# Patient Record
Sex: Female | Born: 1945 | Race: White | Hispanic: No | Smoking: Former smoker
Health system: Southern US, Community
[De-identification: ages and names within clinical notes are randomized; demographics above are authoritative.]

## PROBLEM LIST (undated history)

## (undated) DIAGNOSIS — C569 Malignant neoplasm of unspecified ovary: Secondary | ICD-10-CM

## (undated) DIAGNOSIS — I77810 Thoracic aortic ectasia: Secondary | ICD-10-CM

## (undated) DIAGNOSIS — E039 Hypothyroidism, unspecified: Secondary | ICD-10-CM

## (undated) DIAGNOSIS — E785 Hyperlipidemia, unspecified: Secondary | ICD-10-CM

## (undated) DIAGNOSIS — I10 Essential (primary) hypertension: Secondary | ICD-10-CM

## (undated) DIAGNOSIS — M1990 Unspecified osteoarthritis, unspecified site: Secondary | ICD-10-CM

## (undated) DIAGNOSIS — H3530 Unspecified macular degeneration: Secondary | ICD-10-CM

## (undated) HISTORY — PX: BUNIONECTOMY: SHX129

## (undated) HISTORY — PX: TONSILLECTOMY: SUR1361

## (undated) HISTORY — DX: Hypothyroidism, unspecified: E03.9

## (undated) HISTORY — PX: COLONOSCOPY: SHX174

## (undated) HISTORY — DX: Hyperlipidemia, unspecified: E78.5

## (undated) HISTORY — PX: SHOULDER ARTHROSCOPY: SHX128

## (undated) HISTORY — PX: REFRACTIVE SURGERY: SHX103

## (undated) HISTORY — PX: EYE SURGERY: SHX253

## (undated) HISTORY — PX: ANKLE FRACTURE SURGERY: SHX122

---

## 1998-11-24 ENCOUNTER — Other Ambulatory Visit: Admission: RE | Admit: 1998-11-24 | Discharge: 1998-11-24 | Payer: Self-pay | Admitting: Obstetrics and Gynecology

## 1999-12-16 ENCOUNTER — Encounter: Payer: Self-pay | Admitting: *Deleted

## 1999-12-16 ENCOUNTER — Encounter: Admission: RE | Admit: 1999-12-16 | Discharge: 1999-12-16 | Payer: Self-pay | Admitting: *Deleted

## 2000-01-10 ENCOUNTER — Other Ambulatory Visit: Admission: RE | Admit: 2000-01-10 | Discharge: 2000-01-10 | Payer: Self-pay | Admitting: Obstetrics and Gynecology

## 2000-06-09 ENCOUNTER — Encounter: Admission: RE | Admit: 2000-06-09 | Discharge: 2000-06-09 | Payer: Self-pay | Admitting: *Deleted

## 2000-06-09 ENCOUNTER — Encounter: Payer: Self-pay | Admitting: *Deleted

## 2001-01-25 ENCOUNTER — Encounter: Admission: RE | Admit: 2001-01-25 | Discharge: 2001-01-25 | Payer: Self-pay | Admitting: Internal Medicine

## 2001-01-25 ENCOUNTER — Encounter: Payer: Self-pay | Admitting: Internal Medicine

## 2001-02-23 ENCOUNTER — Other Ambulatory Visit: Admission: RE | Admit: 2001-02-23 | Discharge: 2001-02-23 | Payer: Self-pay | Admitting: Obstetrics and Gynecology

## 2001-10-15 ENCOUNTER — Encounter: Admission: RE | Admit: 2001-10-15 | Discharge: 2001-10-15 | Payer: Self-pay | Admitting: Orthopedic Surgery

## 2001-10-15 ENCOUNTER — Encounter: Payer: Self-pay | Admitting: Orthopedic Surgery

## 2002-02-12 ENCOUNTER — Encounter: Admission: RE | Admit: 2002-02-12 | Discharge: 2002-02-12 | Payer: Self-pay | Admitting: Internal Medicine

## 2002-02-12 ENCOUNTER — Encounter: Payer: Self-pay | Admitting: Internal Medicine

## 2002-04-18 ENCOUNTER — Other Ambulatory Visit: Admission: RE | Admit: 2002-04-18 | Discharge: 2002-04-18 | Payer: Self-pay | Admitting: Obstetrics and Gynecology

## 2003-02-19 ENCOUNTER — Encounter: Payer: Self-pay | Admitting: Internal Medicine

## 2003-02-19 ENCOUNTER — Encounter: Admission: RE | Admit: 2003-02-19 | Discharge: 2003-02-19 | Payer: Self-pay | Admitting: Internal Medicine

## 2003-03-03 ENCOUNTER — Encounter: Admission: RE | Admit: 2003-03-03 | Discharge: 2003-03-03 | Payer: Self-pay | Admitting: Obstetrics and Gynecology

## 2003-03-03 ENCOUNTER — Encounter: Payer: Self-pay | Admitting: Obstetrics and Gynecology

## 2003-04-28 ENCOUNTER — Other Ambulatory Visit: Admission: RE | Admit: 2003-04-28 | Discharge: 2003-04-28 | Payer: Self-pay | Admitting: Obstetrics and Gynecology

## 2004-03-04 ENCOUNTER — Other Ambulatory Visit: Admission: RE | Admit: 2004-03-04 | Discharge: 2004-03-04 | Payer: Self-pay | Admitting: Internal Medicine

## 2004-03-18 ENCOUNTER — Encounter: Admission: RE | Admit: 2004-03-18 | Discharge: 2004-03-18 | Payer: Self-pay | Admitting: Internal Medicine

## 2004-05-24 ENCOUNTER — Encounter (INDEPENDENT_AMBULATORY_CARE_PROVIDER_SITE_OTHER): Payer: Self-pay | Admitting: Specialist

## 2004-05-24 ENCOUNTER — Ambulatory Visit (HOSPITAL_COMMUNITY): Admission: RE | Admit: 2004-05-24 | Discharge: 2004-05-24 | Payer: Self-pay | Admitting: Gastroenterology

## 2005-04-05 ENCOUNTER — Encounter: Admission: RE | Admit: 2005-04-05 | Discharge: 2005-04-05 | Payer: Self-pay | Admitting: Internal Medicine

## 2005-06-09 ENCOUNTER — Other Ambulatory Visit: Admission: RE | Admit: 2005-06-09 | Discharge: 2005-06-09 | Payer: Self-pay | Admitting: Internal Medicine

## 2005-06-13 ENCOUNTER — Encounter: Admission: RE | Admit: 2005-06-13 | Discharge: 2005-06-13 | Payer: Self-pay | Admitting: Internal Medicine

## 2006-04-06 ENCOUNTER — Encounter: Admission: RE | Admit: 2006-04-06 | Discharge: 2006-04-06 | Payer: Self-pay | Admitting: Internal Medicine

## 2006-07-11 ENCOUNTER — Other Ambulatory Visit: Admission: RE | Admit: 2006-07-11 | Discharge: 2006-07-11 | Payer: Self-pay | Admitting: Internal Medicine

## 2006-07-14 ENCOUNTER — Encounter: Admission: RE | Admit: 2006-07-14 | Discharge: 2006-07-14 | Payer: Self-pay | Admitting: Internal Medicine

## 2007-05-10 ENCOUNTER — Encounter: Admission: RE | Admit: 2007-05-10 | Discharge: 2007-05-10 | Payer: Self-pay | Admitting: *Deleted

## 2007-07-16 ENCOUNTER — Other Ambulatory Visit: Admission: RE | Admit: 2007-07-16 | Discharge: 2007-07-16 | Payer: Self-pay | Admitting: *Deleted

## 2008-05-15 ENCOUNTER — Encounter: Admission: RE | Admit: 2008-05-15 | Discharge: 2008-05-15 | Payer: Self-pay | Admitting: *Deleted

## 2009-05-19 ENCOUNTER — Encounter: Admission: RE | Admit: 2009-05-19 | Discharge: 2009-05-19 | Payer: Self-pay | Admitting: Family Medicine

## 2009-07-30 ENCOUNTER — Encounter: Admission: RE | Admit: 2009-07-30 | Discharge: 2009-07-30 | Payer: Self-pay | Admitting: Internal Medicine

## 2010-05-20 ENCOUNTER — Encounter: Admission: RE | Admit: 2010-05-20 | Discharge: 2010-05-20 | Payer: Self-pay | Admitting: *Deleted

## 2010-10-06 ENCOUNTER — Other Ambulatory Visit: Admission: RE | Admit: 2010-10-06 | Discharge: 2010-10-06 | Payer: Self-pay | Admitting: Family Medicine

## 2011-01-01 ENCOUNTER — Encounter: Payer: Self-pay | Admitting: Internal Medicine

## 2011-04-12 ENCOUNTER — Other Ambulatory Visit: Payer: Self-pay | Admitting: Internal Medicine

## 2011-04-12 DIAGNOSIS — Z1231 Encounter for screening mammogram for malignant neoplasm of breast: Secondary | ICD-10-CM

## 2011-04-29 NOTE — Op Note (Signed)
Lori Conrad, Lori Conrad                            ACCOUNT NO.:  [id number]   MEDICAL RECORD NO.:  [id number]                   PATIENT TYPE:  AMB   LOCATION:  ENDO                                 FACILITY:  [facility]   PHYSICIAN:  Bernette Redbird, M.D.                DATE OF BIRTH:  December 11, 1946   DATE OF PROCEDURE:  05/24/2004  DATE OF DISCHARGE:                                 OPERATIVE REPORT   PROCEDURE:  Upper endoscopy.   INDICATIONS FOR PROCEDURE:  Transiently hemoccult positive stool on rectal  exam with no anemia and subsequent hemoccult negative.  No upper tract  symptoms.   FINDINGS:  Normal exam.   PROCEDURE:  The nature, purpose, and risks of the procedure had been  discussed with the patient who provided written consent.  Sedation was  Fentanyl 60 mcg and Versed 6 mg IV without arrhythmias or desaturations.  The Olympus video endoscope was passed under direct vision.  The larynx  looked totally normal.  The esophagus was readily entered and had entirely  normal mucosa without evidence of reflux esophagitis, free reflux, Barrett's  esophagus, varices, infection, neoplasia, ring, stricture, or hiatal hernia.  The stomach contained no significant residual and had normal mucosa without  evidence of gastritis, erosions, ulcers, polyps, or masses including a  retroflex view of the cardia, and the pylorus, duodenal bulb, and second  duodenum looked normal.  The scope was removed from the patient, no biopsies  were obtained.  She tolerated the procedure well and there were no apparent  complications.   IMPRESSION:  Heme positive stool without source evidence on this exam  (792.1).   PLAN:  Proceed to colonoscopic evaluation.                                               Bernette Redbird, M.D.    RB/MEDQ  D:  05/24/2004  T:  05/24/2004  Job:  [id number]   cc:   Marcene Duos, M.D.  [address]  [place]  [place] [address]  Fax: [phone]

## 2011-04-29 NOTE — Op Note (Signed)
NAMEAMBRY, DIX                            ACCOUNT NO.:  [id number]   MEDICAL RECORD NO.:  [id number]                   PATIENT TYPE:  AMB   LOCATION:  ENDO                                 FACILITY:  [facility]   PHYSICIAN:  Bernette Redbird, M.D.                DATE OF BIRTH:  07-06-1946   DATE OF PROCEDURE:  05/24/2004  DATE OF DISCHARGE:                                 OPERATIVE REPORT   PROCEDURE:  Colonoscopy with biopsy.   INDICATIONS FOR PROCEDURE:  Lori Conrad is a 65 year old female who had  transiently Hemoccult positive stool. It was detected on the rectal exam in  her primary physician's office but subsequent home Hemoccults were negative  and she is not anemic. There is no family history of colorectal neoplasia  nor has she had any worrisome symptoms. She had a negative flexible  sigmoidoscopy by me on two prior occasions, 5 years ago and 13 years ago.  Her endoscopy was negative for any source of heme positivity.   FINDINGS:  Diminutive rectal polyp.   DESCRIPTION OF PROCEDURE:  The nature, purpose and risk of the procedure had  been discussed with the patient who provided written consent. Sedation for  this procedure and the upper endoscopy which preceded it totaled fentanyl 90  mcg and Versed 9 mg IV without arrhythmias or desaturations.   The Olympus adjustable tension pediatric video colonoscope was advanced with  some looping, overcome by having the patient in the supine position and  applying external abdominal compression.  The terminal ileum looked normal  and pullback was then performed. The quality of the prep was excellent and  it is felt that all areas were well seen.   There was a diminutive 3 mm sessile hyperplastic appearing polyp in the  rectum about 6 cm in the external anal opening, removed by a couple of cold  biopsies.  It did not look like it would account for heme positivity.  No  other polyps were seen and there was no evidence of cancer, colitis,  vascular malformations or diverticulosis. Retroflexion in the rectum was  normal.   The patient tolerated the procedure well and there were no apparent  complications.   IMPRESSION:  1. Diminutive sessile rectal polyp removed as described above (211.4).  2. No source of transiently Hemoccult positive stool evident either on this     exam or her upper endoscopy.   PLAN:  Await pathology on the polyp. Sigmoidoscopy in five years if it is  hyperplastic, or colonoscopy in five years if it is adenomatous. No further  evaluation of the transiently Hemoccult positive stool needed at present.                                               Molly Maduro  Buccini, M.D.    RB/MEDQ  D:  05/24/2004  T:  05/24/2004  Job:  [id number]   cc:   Marcene Duos, M.D.  [address]  [place]  [place] [address]  Fax: [phone]

## 2011-05-23 ENCOUNTER — Ambulatory Visit
Admission: RE | Admit: 2011-05-23 | Discharge: 2011-05-23 | Disposition: A | Discharge status: Home / Self Care | Payer: Medicare Other | Source: Ambulatory Visit | Attending: Internal Medicine | Admitting: Internal Medicine

## 2011-05-23 DIAGNOSIS — Z1231 Encounter for screening mammogram for malignant neoplasm of breast: Secondary | ICD-10-CM

## 2012-04-27 ENCOUNTER — Other Ambulatory Visit: Payer: Self-pay | Admitting: Family Medicine

## 2012-04-27 DIAGNOSIS — Z1231 Encounter for screening mammogram for malignant neoplasm of breast: Secondary | ICD-10-CM

## 2012-05-29 ENCOUNTER — Ambulatory Visit
Admission: RE | Admit: 2012-05-29 | Discharge: 2012-05-29 | Disposition: A | Discharge status: Home / Self Care | Payer: Medicare Other | Source: Ambulatory Visit | Attending: Family Medicine | Admitting: Family Medicine

## 2012-05-29 DIAGNOSIS — Z1231 Encounter for screening mammogram for malignant neoplasm of breast: Secondary | ICD-10-CM

## 2012-10-10 ENCOUNTER — Other Ambulatory Visit: Payer: Self-pay | Admitting: Family Medicine

## 2013-04-24 ENCOUNTER — Other Ambulatory Visit: Payer: Self-pay

## 2013-04-24 DIAGNOSIS — Z1231 Encounter for screening mammogram for malignant neoplasm of breast: Secondary | ICD-10-CM

## 2013-05-30 ENCOUNTER — Ambulatory Visit
Admission: RE | Admit: 2013-05-30 | Discharge: 2013-05-30 | Disposition: A | Discharge status: Home / Self Care | Payer: Medicare Other | Source: Ambulatory Visit

## 2013-05-30 DIAGNOSIS — Z1231 Encounter for screening mammogram for malignant neoplasm of breast: Secondary | ICD-10-CM

## 2014-04-04 ENCOUNTER — Other Ambulatory Visit: Payer: Self-pay | Admitting: Family Medicine

## 2014-04-04 DIAGNOSIS — R05 Cough: Secondary | ICD-10-CM

## 2014-04-04 DIAGNOSIS — R053 Chronic cough: Secondary | ICD-10-CM

## 2014-04-10 ENCOUNTER — Ambulatory Visit
Admission: RE | Admit: 2014-04-10 | Discharge: 2014-04-10 | Disposition: A | Discharge status: Home / Self Care | Payer: Medicare Other | Source: Ambulatory Visit | Attending: Family Medicine | Admitting: Family Medicine

## 2014-04-10 ENCOUNTER — Encounter (INDEPENDENT_AMBULATORY_CARE_PROVIDER_SITE_OTHER): Payer: Self-pay

## 2014-04-10 DIAGNOSIS — R05 Cough: Secondary | ICD-10-CM

## 2014-04-10 DIAGNOSIS — R053 Chronic cough: Secondary | ICD-10-CM

## 2014-05-06 ENCOUNTER — Other Ambulatory Visit: Payer: Self-pay

## 2014-05-06 DIAGNOSIS — Z1231 Encounter for screening mammogram for malignant neoplasm of breast: Secondary | ICD-10-CM

## 2014-05-15 ENCOUNTER — Telehealth (HOSPITAL_COMMUNITY): Payer: Self-pay

## 2014-05-15 ENCOUNTER — Encounter: Payer: Self-pay | Admitting: Internal Medicine

## 2014-05-15 ENCOUNTER — Ambulatory Visit (INDEPENDENT_AMBULATORY_CARE_PROVIDER_SITE_OTHER): Payer: Medicare Other | Admitting: Internal Medicine

## 2014-05-15 VITALS — BP 124/72 | HR 80 | Ht 62.0 in | Wt 115.6 lb

## 2014-05-15 DIAGNOSIS — R0600 Dyspnea, unspecified: Secondary | ICD-10-CM

## 2014-05-15 DIAGNOSIS — R0989 Other specified symptoms and signs involving the circulatory and respiratory systems: Secondary | ICD-10-CM

## 2014-05-15 DIAGNOSIS — R0609 Other forms of dyspnea: Secondary | ICD-10-CM

## 2014-05-15 DIAGNOSIS — E785 Hyperlipidemia, unspecified: Secondary | ICD-10-CM

## 2014-05-15 DIAGNOSIS — I2510 Atherosclerotic heart disease of native coronary artery without angina pectoris: Secondary | ICD-10-CM

## 2014-05-15 DIAGNOSIS — R931 Abnormal findings on diagnostic imaging of heart and coronary circulation: Secondary | ICD-10-CM

## 2014-05-15 DIAGNOSIS — R0602 Shortness of breath: Secondary | ICD-10-CM

## 2014-05-15 DIAGNOSIS — I2584 Coronary atherosclerosis due to calcified coronary lesion: Secondary | ICD-10-CM

## 2014-05-15 NOTE — Patient Instructions (Signed)
Your physician has requested that you have an exercise tolerance test. For further information please visit [email or web address]. Please also follow instruction sheet, as given.  Your physician recommends that you return for lab work at your earliest convenience.   Your physician recommends that you schedule a follow-up appointment in: 1 month.

## 2014-05-19 ENCOUNTER — Encounter: Payer: Self-pay | Admitting: Internal Medicine

## 2014-05-19 DIAGNOSIS — R0609 Other forms of dyspnea: Secondary | ICD-10-CM

## 2014-05-19 DIAGNOSIS — R0600 Dyspnea, unspecified: Secondary | ICD-10-CM | POA: Insufficient documentation

## 2014-05-19 DIAGNOSIS — R931 Abnormal findings on diagnostic imaging of heart and coronary circulation: Secondary | ICD-10-CM | POA: Insufficient documentation

## 2014-05-19 DIAGNOSIS — E785 Hyperlipidemia, unspecified: Secondary | ICD-10-CM | POA: Insufficient documentation

## 2014-05-19 LAB — NMR LIPOPROFILE WITH LIPIDS
CHOLESTEROL, TOTAL: 207 mg/dL — AB (ref <200)
HDL PARTICLE NUMBER: 51.3 umol/L (ref >=30.5)
HDL Size: 9.4 nm (ref >=9.2)
HDL-C: 87 mg/dL (ref >=40)
LDL CALC: 106 mg/dL — AB (ref <100)
LDL PARTICLE NUMBER: 1427 nmol/L — AB (ref <1000)
LDL Size: 21 nm (ref >20.5)
LP-IR SCORE: 33 (ref <=45)
Large HDL-P: 13.6 umol/L (ref >=4.8)
Large VLDL-P: 1.6 nmol/L (ref <=2.7)
SMALL LDL PARTICLE NUMBER: 553 nmol/L — AB (ref <=527)
TRIGLYCERIDES: 68 mg/dL (ref <150)
VLDL Size: 48.4 nm — ABNORMAL HIGH (ref <=46.6)

## 2014-05-19 NOTE — Progress Notes (Signed)
OFFICE NOTE  Chief Complaint:  Abnormal chest CT showing coronary calcium  Primary Care Physician: Cammy Copa, MD  HPI:  Lori Conrad is a pleasant 68 year old female who was referred to me by Dr. Rhina Brackett for evaluation of coronary artery calcium. Lori Conrad is has a history of smoking in the past and has had cough for about 2 months. She recently underwent a CT scan to evaluate for possible malignancy based on guidelines. This did not show any nodule, infiltrate or adenopathy. There was however a prominent descending aorta measuring 4 cm as well as coronary artery calcifications. She is referred for evaluation of her coronary artery calcium. This was not a quantitative exam. With regards to symptoms she reports a mild shortness of breath with exercise however no significant chest pain. She does do exercise at the gym several times a week. She has a remote history of possible mitral valve prolapse. Shows a has hypothyroidism and dyslipidemia. There is a family history of heart disease, however not in her first generation relatives.  PMHx:  Past Medical History  Diagnosis Date  . Hyperlipidemia   . Hypothyroidism     History reviewed. No pertinent past surgical history.  FAMHx:  Family History  Problem Relation Age of Onset  . Alzheimer's disease Father   . CAD Father   . Lung cancer Mother   . Stroke Mother 73  . Alzheimer's disease Father   . CAD Father     95% occlusion of L Main & RCA, severe descending aorta, arterial & arteriolonephrosclerosis   . Cholecystitis Father   . Esophagitis Father     SOCHx:   reports that she quit smoking about 26 years ago. She has never used smokeless tobacco. She reports that she drinks about 7 ounces of alcohol per week. She reports that she does not use illicit drugs.  ALLERGIES:  No Known Allergies  ROS: A comprehensive review of systems was negative except for: Respiratory: positive for dyspnea on  exertion  HOME MEDS: Current Outpatient Prescriptions  Medication Sig Dispense Refill  . alendronate (FOSAMAX) 70 MG tablet Take 1 tablet by mouth once a week.      Marland Kitchen aspirin 81 MG tablet Take 81 mg by mouth daily.      Marland Kitchen atorvastatin (LIPITOR) 10 MG tablet Take 1 tablet by mouth daily.      . Calcium Citrate-Vitamin D (CALCIUM CITRATE + D PO) Take by mouth daily. 600mg  with 400IU      . Coenzyme Q10 (CO Q-10) 100 MG CAPS Take by mouth daily.      . diclofenac (CATAFLAM) 50 MG tablet Take 1 tablet by mouth daily as needed.      Marland Kitchen glucosamine-chondroitin 500-400 MG tablet Take 3 tablets by mouth daily.      . Magnesium 250 MG TABS Take 1 tablet by mouth daily.      . Multiple Vitamins-Minerals (CENTRUM ADULTS PO) Take by mouth daily.      . Omega 3-6-9 Fatty Acids (OMEGA 3-6-9 COMPLEX PO) Take by mouth daily.      Marland Kitchen SYNTHROID 100 MCG tablet Take 1 tablet by mouth as directed. Take on MWF 112 mcg T, Th, Sat, Sun      . zolpidem (AMBIEN) 10 MG tablet at bedtime as needed.       No current facility-administered medications for this visit.    LABS/IMAGING: No results found for this or any previous visit (from the past 48 hour(s)). No results  found.  VITALS: BP 124/72  Pulse 80  Ht 5\' 2"  (1.575 m)  Wt 115 lb 9.6 oz (52.436 kg)  BMI 21.14 kg/m2  EXAM: General appearance: alert and no distress Neck: no carotid bruit and no JVD Lungs: clear to auscultation bilaterally Heart: regular rate and rhythm, S1, S2 normal, no murmur, click, rub or gallop Abdomen: soft, non-tender; bowel sounds normal; no masses,  no organomegaly Extremities: extremities normal, atraumatic, no cyanosis or edema Pulses: 2+ and symmetric Skin: Skin color, texture, turgor normal. No rashes or lesions Neurologic: Grossly normal Psych: Mood, affect normal  EKG: Normal sinus rhythm at 80  ASSESSMENT: 1. Coronary artery calcification 2. Dyslipidemia 3. Prediabetes 4. Mild dyspnea with exertion  PLAN: 1.    Lori Conrad has coronary artery calcium, however this may been present for some time. She does have a smoking history and dyslipidemia. She is currently well treated for both. She is stop smoking. She is not having any anginal symptoms but does get mild shortness of breath with exercise. I would recommend an exercise treadmill stress test to evaluate for significant ischemia with exercise. If this is negative I would continue with risk factor modification and better dietary glucose control.    Thank you as always for the consultation.  Pixie Casino, MD, [facility] Attending Cardiologist [place] 05/19/2014, 2:48 PM

## 2014-05-20 ENCOUNTER — Ambulatory Visit (HOSPITAL_COMMUNITY)
Admission: RE | Admit: 2014-05-20 | Discharge: 2014-05-20 | Disposition: A | Discharge status: Home / Self Care | Payer: Medicare Other | Source: Ambulatory Visit | Attending: Cardiovascular Disease | Admitting: Cardiovascular Disease

## 2014-05-20 DIAGNOSIS — I2584 Coronary atherosclerosis due to calcified coronary lesion: Secondary | ICD-10-CM

## 2014-05-20 DIAGNOSIS — R0602 Shortness of breath: Secondary | ICD-10-CM

## 2014-05-20 DIAGNOSIS — I2510 Atherosclerotic heart disease of native coronary artery without angina pectoris: Secondary | ICD-10-CM

## 2014-05-21 ENCOUNTER — Telehealth: Payer: Self-pay | Admitting: *Deleted

## 2014-05-21 NOTE — Telephone Encounter (Signed)
Patient notified of normal exercise tolerance test results. Reminded of OV July 7th.

## 2014-06-03 ENCOUNTER — Ambulatory Visit
Admission: RE | Admit: 2014-06-03 | Discharge: 2014-06-03 | Disposition: A | Discharge status: Home / Self Care | Payer: Medicare Other | Source: Ambulatory Visit

## 2014-06-03 ENCOUNTER — Encounter (HOSPITAL_COMMUNITY): Payer: Medicare Other

## 2014-06-03 ENCOUNTER — Encounter (INDEPENDENT_AMBULATORY_CARE_PROVIDER_SITE_OTHER): Payer: Self-pay

## 2014-06-03 DIAGNOSIS — Z1231 Encounter for screening mammogram for malignant neoplasm of breast: Secondary | ICD-10-CM

## 2014-06-17 ENCOUNTER — Encounter: Payer: Self-pay | Admitting: Internal Medicine

## 2014-06-17 ENCOUNTER — Ambulatory Visit (INDEPENDENT_AMBULATORY_CARE_PROVIDER_SITE_OTHER): Payer: Medicare Other | Admitting: Internal Medicine

## 2014-06-17 ENCOUNTER — Encounter: Payer: Self-pay | Admitting: *Deleted

## 2014-06-17 VITALS — BP 130/78 | HR 78 | Ht 63.0 in | Wt 114.9 lb

## 2014-06-17 DIAGNOSIS — R0609 Other forms of dyspnea: Secondary | ICD-10-CM

## 2014-06-17 DIAGNOSIS — E785 Hyperlipidemia, unspecified: Secondary | ICD-10-CM

## 2014-06-17 DIAGNOSIS — R931 Abnormal findings on diagnostic imaging of heart and coronary circulation: Secondary | ICD-10-CM

## 2014-06-17 DIAGNOSIS — I2510 Atherosclerotic heart disease of native coronary artery without angina pectoris: Secondary | ICD-10-CM

## 2014-06-17 DIAGNOSIS — R0989 Other specified symptoms and signs involving the circulatory and respiratory systems: Secondary | ICD-10-CM

## 2014-06-17 DIAGNOSIS — R0600 Dyspnea, unspecified: Secondary | ICD-10-CM

## 2014-06-17 MED ORDER — ATORVASTATIN CALCIUM 40 MG PO TABS: 40.0000 mg | ORAL_TABLET | Freq: Every day | ORAL | Status: DC

## 2014-06-17 NOTE — Progress Notes (Signed)
OFFICE NOTE  Chief Complaint:  Abnormal chest CT showing coronary calcium  Primary Care Physician: Cammy Copa, MD  HPI:  Lori Conrad is a pleasant 68 year old female who was referred to me by Dr. Rhina Brackett for evaluation of coronary artery calcium. [name] is has a history of smoking in the past and has had cough for about 2 months. She recently underwent a CT scan to evaluate for possible malignancy based on guidelines. This did not show any nodule, infiltrate or adenopathy. There was however a prominent descending aorta measuring 4 cm as well as coronary artery calcifications. She is referred for evaluation of her coronary artery calcium. This was not a quantitative exam. With regards to symptoms she reports a mild shortness of breath with exercise however no significant chest pain. She does do exercise at the gym several times a week. She has a remote history of possible mitral valve prolapse. Shows a has hypothyroidism and dyslipidemia. There is a family history of heart disease, however not in her first generation relatives.  Mrs. Wyse returns today for followup. She did an excellent job on her exercise treadmill stress test. She exercised about 9 minutes with no symptoms. This was negative for ischemia.  There was a hypertensive response to exercise although she does have hypertension which is controlled. 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 She did have a lipid profile which was performed showing a particle number in the 1400s and LDL of 106. Obviously this could be lower to reduce her risk of coronary disease.  PMHx:  Past Medical History  Diagnosis Date  . Hyperlipidemia   . Hypothyroidism     History reviewed. No pertinent past surgical history.  FAMHx:  Family History  Problem Relation Age of Onset  . Alzheimer's disease Father   . CAD Father   . Lung cancer Mother   . Stroke Mother 45  . Alzheimer's disease Father   . CAD  Father     95% occlusion of L Main & RCA, severe descending aorta, arterial & arteriolonephrosclerosis   . Cholecystitis Father   . Esophagitis Father     SOCHx:   reports that she quit smoking about 26 years ago. She has never used smokeless tobacco. She reports that she drinks about 7 ounces of alcohol per week. She reports that she does not use illicit drugs.  ALLERGIES:  No Known Allergies  ROS: A comprehensive review of systems was negative.  HOME MEDS: Current Outpatient Prescriptions  Medication Sig Dispense Refill  . alendronate (FOSAMAX) 70 MG tablet Take 1 tablet by mouth once a week.      Marland Kitchen aspirin 81 MG tablet Take 81 mg by mouth daily.      Marland Kitchen atorvastatin (LIPITOR) 40 MG tablet Take 1 tablet (40 mg total) by mouth daily.  30 tablet  6  . Calcium Citrate-Vitamin D (CALCIUM CITRATE + D PO) Take by mouth daily. 600mg  with 400IU      . Coenzyme Q10 (CO Q-10) 100 MG CAPS Take by mouth daily.      . diclofenac (CATAFLAM) 50 MG tablet Take 1 tablet by mouth daily as needed.      Marland Kitchen glucosamine-chondroitin 500-400 MG tablet Take 3 tablets by mouth daily.      . Magnesium 250 MG TABS Take 1 tablet by mouth daily.      . Multiple Vitamins-Minerals (CENTRUM ADULTS PO) Take by mouth daily.      Ernestine Conrad  3-6-9 Fatty Acids (OMEGA 3-6-9 COMPLEX PO) Take by mouth daily.      Marland Kitchen SYNTHROID 100 MCG tablet Take 1 tablet by mouth as directed. Take on MWF 112 mcg T, Th, Sat, Sun      . zolpidem (AMBIEN) 10 MG tablet at bedtime as needed.       No current facility-administered medications for this visit.    LABS/IMAGING: No results found for this or any previous visit (from the past 48 hour(s)). No results found.  VITALS: BP 130/78  Pulse 78  Ht 5\' 3"  (1.6 m)  Wt 114 lb 14.4 oz (52.118 kg)  BMI 20.36 kg/m2  EXAM: deferred  EKG: Deferred  ASSESSMENT: 1. Coronary artery calcification - negative GXT 2. Dyslipidemia 3. Prediabetes 4. Mild dyspnea with exertion -  improved  PLAN: 1.   Mrs. Madewell  Had a negative treadmill exercise stress test. At this point I would recommend additional risk factor modification as she has known coronary artery calcification. I would recommend increasing her Lipitor to 40 mg daily and reassessing her lipid profile in 3-4 months. She did have problems with myalgias on Crestor. Fortunately she has tolerated Lipitor however may have problems at a higher dose. I asked her to contact me she has problems with myalgias and maybe a little over medication or add an additional agent such as Zetia.  Thank you as always for the consultation. I will plan to see her back annually or sooner as necessary.  Pixie Casino, MD, [facility] Attending Cardiologist [facility]  HILTY,Kenneth C 06/17/2014, 11:18 AM

## 2014-06-17 NOTE — Patient Instructions (Signed)
Your physician recommends that you return for lab work in: 3-4 months (fasting)  Please increase Lipitor to 40mg  once daily (this has been sent to you pharmacy)  Your physician wants you to follow-up in: 1 year. You will receive a reminder letter in the mail two months in advance. If you don't receive a letter, please call our office to schedule the follow-up appointment.

## 2014-06-25 NOTE — Telephone Encounter (Signed)
Encounter complete. 

## 2014-06-27 ENCOUNTER — Encounter: Payer: Self-pay | Admitting: *Deleted

## 2014-06-27 ENCOUNTER — Encounter: Payer: Self-pay | Admitting: Internal Medicine

## 2014-06-27 NOTE — Telephone Encounter (Signed)
This encounter was created in error - please disregard.

## 2014-06-27 NOTE — Telephone Encounter (Signed)
Please call-she need a new code for my-chart.

## 2014-10-22 ENCOUNTER — Encounter: Payer: Self-pay | Admitting: Internal Medicine

## 2014-12-24 ENCOUNTER — Other Ambulatory Visit: Payer: Self-pay | Admitting: Internal Medicine

## 2014-12-24 NOTE — Telephone Encounter (Signed)
Rx(s) sent to pharmacy electronically.  

## 2015-05-04 ENCOUNTER — Other Ambulatory Visit: Payer: Self-pay

## 2015-05-04 DIAGNOSIS — Z1231 Encounter for screening mammogram for malignant neoplasm of breast: Secondary | ICD-10-CM

## 2015-06-11 ENCOUNTER — Ambulatory Visit
Admission: RE | Admit: 2015-06-11 | Discharge: 2015-06-11 | Disposition: A | Discharge status: Home / Self Care | Payer: Medicare Other | Source: Ambulatory Visit

## 2015-06-11 DIAGNOSIS — Z1231 Encounter for screening mammogram for malignant neoplasm of breast: Secondary | ICD-10-CM

## 2015-07-15 ENCOUNTER — Other Ambulatory Visit: Payer: Self-pay | Admitting: Internal Medicine

## 2015-07-15 NOTE — Telephone Encounter (Signed)
REFILL 

## 2015-07-31 ENCOUNTER — Other Ambulatory Visit: Payer: Self-pay | Admitting: Internal Medicine

## 2015-07-31 NOTE — Telephone Encounter (Signed)
REFILL 

## 2016-01-18 ENCOUNTER — Other Ambulatory Visit: Payer: Self-pay | Admitting: Internal Medicine

## 2016-02-09 ENCOUNTER — Other Ambulatory Visit: Payer: Self-pay | Admitting: Internal Medicine

## 2016-02-09 NOTE — Telephone Encounter (Signed)
REFILL 

## 2016-02-11 ENCOUNTER — Other Ambulatory Visit: Payer: Self-pay | Admitting: Internal Medicine

## 2016-02-11 NOTE — Telephone Encounter (Signed)
REFILL 

## 2016-05-10 ENCOUNTER — Other Ambulatory Visit: Payer: Self-pay

## 2016-05-10 DIAGNOSIS — Z1231 Encounter for screening mammogram for malignant neoplasm of breast: Secondary | ICD-10-CM

## 2016-06-13 ENCOUNTER — Ambulatory Visit
Admission: RE | Admit: 2016-06-13 | Discharge: 2016-06-13 | Disposition: A | Discharge status: Home / Self Care | Payer: Medicare Other | Source: Ambulatory Visit

## 2016-06-13 DIAGNOSIS — Z1231 Encounter for screening mammogram for malignant neoplasm of breast: Secondary | ICD-10-CM

## 2017-01-10 DIAGNOSIS — M8588 Other specified disorders of bone density and structure, other site: Secondary | ICD-10-CM | POA: Diagnosis not present

## 2017-01-18 DIAGNOSIS — H538 Other visual disturbances: Secondary | ICD-10-CM | POA: Diagnosis not present

## 2017-01-18 DIAGNOSIS — Z961 Presence of intraocular lens: Secondary | ICD-10-CM | POA: Diagnosis not present

## 2017-01-23 DIAGNOSIS — L570 Actinic keratosis: Secondary | ICD-10-CM | POA: Diagnosis not present

## 2017-02-16 DIAGNOSIS — D751 Secondary polycythemia: Secondary | ICD-10-CM | POA: Diagnosis not present

## 2017-02-16 DIAGNOSIS — E039 Hypothyroidism, unspecified: Secondary | ICD-10-CM | POA: Diagnosis not present

## 2017-02-16 DIAGNOSIS — R946 Abnormal results of thyroid function studies: Secondary | ICD-10-CM | POA: Diagnosis not present

## 2017-06-02 ENCOUNTER — Other Ambulatory Visit: Payer: Self-pay | Admitting: Family Medicine

## 2017-06-02 DIAGNOSIS — Z1231 Encounter for screening mammogram for malignant neoplasm of breast: Secondary | ICD-10-CM

## 2017-06-22 ENCOUNTER — Ambulatory Visit: Payer: Medicare Other

## 2017-07-06 ENCOUNTER — Ambulatory Visit: Payer: Medicare Other

## 2017-07-18 DIAGNOSIS — F418 Other specified anxiety disorders: Secondary | ICD-10-CM | POA: Diagnosis not present

## 2017-07-18 DIAGNOSIS — M25511 Pain in right shoulder: Secondary | ICD-10-CM | POA: Diagnosis not present

## 2017-07-20 ENCOUNTER — Ambulatory Visit
Admission: RE | Admit: 2017-07-20 | Discharge: 2017-07-20 | Disposition: A | Discharge status: Home / Self Care | Payer: Medicare HMO | Source: Ambulatory Visit | Attending: Family Medicine | Admitting: Family Medicine

## 2017-07-20 DIAGNOSIS — Z1231 Encounter for screening mammogram for malignant neoplasm of breast: Secondary | ICD-10-CM

## 2017-07-24 DIAGNOSIS — S46811A Strain of other muscles, fascia and tendons at shoulder and upper arm level, right arm, initial encounter: Secondary | ICD-10-CM | POA: Diagnosis not present

## 2017-07-31 DIAGNOSIS — S46911A Strain of unspecified muscle, fascia and tendon at shoulder and upper arm level, right arm, initial encounter: Secondary | ICD-10-CM | POA: Diagnosis not present

## 2017-08-07 DIAGNOSIS — S46811D Strain of other muscles, fascia and tendons at shoulder and upper arm level, right arm, subsequent encounter: Secondary | ICD-10-CM | POA: Diagnosis not present

## 2017-08-07 DIAGNOSIS — M7541 Impingement syndrome of right shoulder: Secondary | ICD-10-CM | POA: Diagnosis not present

## 2017-08-17 DIAGNOSIS — M7541 Impingement syndrome of right shoulder: Secondary | ICD-10-CM | POA: Diagnosis not present

## 2017-08-24 DIAGNOSIS — M7541 Impingement syndrome of right shoulder: Secondary | ICD-10-CM | POA: Diagnosis not present

## 2017-08-29 DIAGNOSIS — L570 Actinic keratosis: Secondary | ICD-10-CM | POA: Diagnosis not present

## 2017-08-29 DIAGNOSIS — L820 Inflamed seborrheic keratosis: Secondary | ICD-10-CM | POA: Diagnosis not present

## 2017-08-29 DIAGNOSIS — L821 Other seborrheic keratosis: Secondary | ICD-10-CM | POA: Diagnosis not present

## 2017-08-31 DIAGNOSIS — M7541 Impingement syndrome of right shoulder: Secondary | ICD-10-CM | POA: Diagnosis not present

## 2017-09-06 DIAGNOSIS — M7541 Impingement syndrome of right shoulder: Secondary | ICD-10-CM | POA: Diagnosis not present

## 2017-09-07 DIAGNOSIS — M7541 Impingement syndrome of right shoulder: Secondary | ICD-10-CM | POA: Diagnosis not present

## 2017-09-14 DIAGNOSIS — M7541 Impingement syndrome of right shoulder: Secondary | ICD-10-CM | POA: Diagnosis not present

## 2017-09-19 DIAGNOSIS — M7541 Impingement syndrome of right shoulder: Secondary | ICD-10-CM | POA: Diagnosis not present

## 2017-09-28 DIAGNOSIS — M7541 Impingement syndrome of right shoulder: Secondary | ICD-10-CM | POA: Diagnosis not present

## 2017-10-09 DIAGNOSIS — M7541 Impingement syndrome of right shoulder: Secondary | ICD-10-CM | POA: Diagnosis not present

## 2017-11-25 DIAGNOSIS — J069 Acute upper respiratory infection, unspecified: Secondary | ICD-10-CM | POA: Diagnosis not present

## 2017-12-13 DIAGNOSIS — E7800 Pure hypercholesterolemia, unspecified: Secondary | ICD-10-CM | POA: Diagnosis not present

## 2017-12-13 DIAGNOSIS — J209 Acute bronchitis, unspecified: Secondary | ICD-10-CM | POA: Diagnosis not present

## 2017-12-13 DIAGNOSIS — M810 Age-related osteoporosis without current pathological fracture: Secondary | ICD-10-CM | POA: Diagnosis not present

## 2017-12-13 DIAGNOSIS — Z0000 Encounter for general adult medical examination without abnormal findings: Secondary | ICD-10-CM | POA: Diagnosis not present

## 2017-12-13 DIAGNOSIS — R319 Hematuria, unspecified: Secondary | ICD-10-CM | POA: Diagnosis not present

## 2017-12-13 DIAGNOSIS — E039 Hypothyroidism, unspecified: Secondary | ICD-10-CM | POA: Diagnosis not present

## 2017-12-15 DIAGNOSIS — M7541 Impingement syndrome of right shoulder: Secondary | ICD-10-CM | POA: Diagnosis not present

## 2017-12-15 DIAGNOSIS — G8918 Other acute postprocedural pain: Secondary | ICD-10-CM | POA: Diagnosis not present

## 2017-12-15 DIAGNOSIS — S43431A Superior glenoid labrum lesion of right shoulder, initial encounter: Secondary | ICD-10-CM | POA: Diagnosis not present

## 2017-12-15 DIAGNOSIS — M7521 Bicipital tendinitis, right shoulder: Secondary | ICD-10-CM | POA: Diagnosis not present

## 2017-12-15 DIAGNOSIS — M75111 Incomplete rotator cuff tear or rupture of right shoulder, not specified as traumatic: Secondary | ICD-10-CM | POA: Diagnosis not present

## 2017-12-15 DIAGNOSIS — M19011 Primary osteoarthritis, right shoulder: Secondary | ICD-10-CM | POA: Diagnosis not present

## 2017-12-15 DIAGNOSIS — M659 Synovitis and tenosynovitis, unspecified: Secondary | ICD-10-CM | POA: Diagnosis not present

## 2017-12-15 DIAGNOSIS — M24111 Other articular cartilage disorders, right shoulder: Secondary | ICD-10-CM | POA: Diagnosis not present

## 2017-12-18 DIAGNOSIS — R8290 Unspecified abnormal findings in urine: Secondary | ICD-10-CM | POA: Diagnosis not present

## 2017-12-19 DIAGNOSIS — Z961 Presence of intraocular lens: Secondary | ICD-10-CM | POA: Diagnosis not present

## 2018-01-04 DIAGNOSIS — M25611 Stiffness of right shoulder, not elsewhere classified: Secondary | ICD-10-CM | POA: Diagnosis not present

## 2018-02-12 DIAGNOSIS — M25611 Stiffness of right shoulder, not elsewhere classified: Secondary | ICD-10-CM | POA: Diagnosis not present

## 2018-02-16 DIAGNOSIS — M25611 Stiffness of right shoulder, not elsewhere classified: Secondary | ICD-10-CM | POA: Diagnosis not present

## 2018-02-19 DIAGNOSIS — L603 Nail dystrophy: Secondary | ICD-10-CM | POA: Diagnosis not present

## 2018-02-19 DIAGNOSIS — L570 Actinic keratosis: Secondary | ICD-10-CM | POA: Diagnosis not present

## 2018-02-20 DIAGNOSIS — M25611 Stiffness of right shoulder, not elsewhere classified: Secondary | ICD-10-CM | POA: Diagnosis not present

## 2018-02-22 DIAGNOSIS — M25611 Stiffness of right shoulder, not elsewhere classified: Secondary | ICD-10-CM | POA: Diagnosis not present

## 2018-04-06 DIAGNOSIS — F4323 Adjustment disorder with mixed anxiety and depressed mood: Secondary | ICD-10-CM | POA: Diagnosis not present

## 2018-04-06 DIAGNOSIS — G4700 Insomnia, unspecified: Secondary | ICD-10-CM | POA: Diagnosis not present

## 2018-04-06 DIAGNOSIS — D751 Secondary polycythemia: Secondary | ICD-10-CM | POA: Diagnosis not present

## 2018-04-06 DIAGNOSIS — E875 Hyperkalemia: Secondary | ICD-10-CM | POA: Diagnosis not present

## 2018-04-06 DIAGNOSIS — E039 Hypothyroidism, unspecified: Secondary | ICD-10-CM | POA: Diagnosis not present

## 2018-04-12 DIAGNOSIS — R7309 Other abnormal glucose: Secondary | ICD-10-CM | POA: Diagnosis not present

## 2018-07-04 ENCOUNTER — Other Ambulatory Visit: Payer: Self-pay | Admitting: Family Medicine

## 2018-07-04 DIAGNOSIS — Z1231 Encounter for screening mammogram for malignant neoplasm of breast: Secondary | ICD-10-CM

## 2018-07-24 ENCOUNTER — Ambulatory Visit
Admission: RE | Admit: 2018-07-24 | Discharge: 2018-07-24 | Disposition: A | Discharge status: Home / Self Care | Payer: Medicare HMO | Source: Ambulatory Visit | Attending: Family Medicine | Admitting: Family Medicine

## 2018-07-24 DIAGNOSIS — Z1231 Encounter for screening mammogram for malignant neoplasm of breast: Secondary | ICD-10-CM | POA: Diagnosis not present

## 2018-12-21 DIAGNOSIS — E039 Hypothyroidism, unspecified: Secondary | ICD-10-CM | POA: Diagnosis not present

## 2018-12-21 DIAGNOSIS — Z961 Presence of intraocular lens: Secondary | ICD-10-CM | POA: Diagnosis not present

## 2018-12-21 DIAGNOSIS — Z0000 Encounter for general adult medical examination without abnormal findings: Secondary | ICD-10-CM | POA: Diagnosis not present

## 2018-12-21 DIAGNOSIS — M810 Age-related osteoporosis without current pathological fracture: Secondary | ICD-10-CM | POA: Diagnosis not present

## 2018-12-21 DIAGNOSIS — E785 Hyperlipidemia, unspecified: Secondary | ICD-10-CM | POA: Diagnosis not present

## 2018-12-26 ENCOUNTER — Other Ambulatory Visit: Payer: Self-pay | Admitting: Family Medicine

## 2018-12-26 DIAGNOSIS — M810 Age-related osteoporosis without current pathological fracture: Secondary | ICD-10-CM

## 2019-01-09 DIAGNOSIS — R946 Abnormal results of thyroid function studies: Secondary | ICD-10-CM | POA: Diagnosis not present

## 2019-02-01 ENCOUNTER — Ambulatory Visit: Payer: Self-pay | Admitting: Plastic Surgery

## 2019-02-01 ENCOUNTER — Encounter: Payer: Self-pay | Admitting: Plastic Surgery

## 2019-02-01 DIAGNOSIS — Z719 Counseling, unspecified: Secondary | ICD-10-CM | POA: Insufficient documentation

## 2019-02-01 MED ORDER — LIDOCAINE-PRILOCAINE 2.5-2.5 % EX CREA: 1.0000 "application " | TOPICAL_CREAM | CUTANEOUS | 0 refills | Status: DC | PRN

## 2019-02-01 NOTE — Progress Notes (Signed)
Patient ID: Lori Conrad, female    DOB: Apr 30, 1946, 73 y.o.   MRN: [id number]   Chief Complaint  Patient presents with  . Advice Only    for scar on chin    The patient is a 73 year old white female here for evaluation of a scar on her left chin.  She had severe acne in her 74s with some scarring.  She had several areas treated by Dr. Wendy Poet with good results.  She does look good today.  It seems to be getting worse with time due to the wrinkling.  Nothing makes it better.  She has not had any treatment to the area since the acne occurred.  It is apparent that as she has lost the elasticity and has ptosis that is what made this more prominent.  With light pressure pulling her cheek up the area nearly disappeared.     Review of Systems  Constitutional: Negative.   HENT: Negative.   Eyes: Negative.   Respiratory: Negative.   Cardiovascular: Negative.   Gastrointestinal: Negative.   Genitourinary: Negative.   Hematological: Negative.   Psychiatric/Behavioral: Negative.     Past Medical History:  Diagnosis Date  . Hyperlipidemia   . Hypothyroidism     History reviewed. No pertinent surgical history.    Current Outpatient Medications:  .  aspirin 81 MG tablet, Take 81 mg by mouth daily., Disp: , Rfl:  .  atorvastatin (LIPITOR) 40 MG tablet, Take 1 tablet (40 mg total) by mouth daily at 6 PM. NEED OV., Disp: 15 tablet, Rfl: 0 .  Calcium Citrate-Vitamin D (CALCIUM CITRATE + D PO), Take by mouth daily. 600mg  with 400IU, Disp: , Rfl:  .  Coenzyme Q10 (CO Q-10) 100 MG CAPS, Take by mouth daily., Disp: , Rfl:  .  diclofenac (CATAFLAM) 50 MG tablet, Take 1 tablet by mouth daily as needed., Disp: , Rfl:  .  glucosamine-chondroitin 500-400 MG tablet, Take 3 tablets by mouth daily., Disp: , Rfl:  .  Magnesium 250 MG TABS, Take 1 tablet by mouth daily., Disp: , Rfl:  .  Multiple Vitamins-Minerals (CENTRUM ADULTS PO), Take by mouth daily., Disp: , Rfl:  .  Omega 3-6-9 Fatty Acids  (OMEGA 3-6-9 COMPLEX PO), Take by mouth daily., Disp: , Rfl:  .  SYNTHROID 88 MCG tablet, Take 1 tablet by mouth daily., Disp: , Rfl:    Objective:   Vitals:   02/01/19 1304  BP: (!) 135/92  Pulse: 73  Temp: 98.5 F (36.9 C)  SpO2: 98%    Physical Exam Vitals signs and nursing note reviewed.  Constitutional:      Appearance: Normal appearance.  HENT:     Head: Normocephalic and atraumatic.  Cardiovascular:     Rate and Rhythm: Normal rate.  Pulmonary:     Effort: Pulmonary effort is normal.  Neurological:     General: No focal deficit present.     Mental Status: She is alert.  Psychiatric:        Mood and Affect: Mood normal.        Thought Content: Thought content normal.        Judgment: Judgment normal.     Assessment & Plan:  Encounter for counseling I think the patient would be a good candidate for fractional.  I would like to try that before excision of the scar area.  If fractional does not work then we could still do the excision.  The patient really likes this idea  and she obtained a quote from [place].  She will let us know if she wants to schedule.  If she does we can give her EMLA to apply prior to her procedure.   [place], DO

## 2019-02-11 DIAGNOSIS — M545 Low back pain: Secondary | ICD-10-CM | POA: Diagnosis not present

## 2019-02-21 ENCOUNTER — Other Ambulatory Visit: Payer: Medicare HMO

## 2019-02-22 ENCOUNTER — Other Ambulatory Visit: Payer: Self-pay

## 2019-02-22 ENCOUNTER — Encounter: Payer: Self-pay | Admitting: Plastic Surgery

## 2019-02-22 ENCOUNTER — Ambulatory Visit (INDEPENDENT_AMBULATORY_CARE_PROVIDER_SITE_OTHER): Payer: Self-pay | Admitting: Plastic Surgery

## 2019-02-22 VITALS — BP 133/82 | HR 71 | Temp 98.6°F | Ht 62.0 in | Wt 118.8 lb

## 2019-02-22 DIAGNOSIS — Z719 Counseling, unspecified: Secondary | ICD-10-CM

## 2019-02-22 NOTE — Progress Notes (Signed)
Preoperative Dx: redness of face  Postoperative Dx:  same  Procedure: laser to face   Anesthesia: none  Description of Procedure:  Risks and complications were explained to the patient. Consent was confirmed and signed. Time out was called and all information was confirmed to be correct. The area  area was prepped with alcohol and wiped dry. The 530 laser was set at 7.2 J/cm2. The face was lasered. The patient tolerated the procedure well and there were no complications. The patient is to follow up in 4 weeks.

## 2019-02-28 ENCOUNTER — Telehealth: Payer: Self-pay | Admitting: Plastic Surgery

## 2019-02-28 NOTE — Telephone Encounter (Signed)
Patient called with questions about upcoming fraxil laser treatment. Patient would like to know if it is okay to use a retinol or antibiotic ointment on her face prior to appointment. Patient is worried about a reaction to the laser. Please call patient and advise

## 2019-03-01 NOTE — Telephone Encounter (Signed)
Called and spoke with the patient regarding the message below.  Informed the patient that I spoke with [facility] and she stated that Retinol and other facial meds should be stopped 7-10 days before and after any Laser procedure.  Patient stated that is what she needed to know.  Patient verbalized understanding and agreed.//AB/CMA

## 2019-03-11 DIAGNOSIS — E039 Hypothyroidism, unspecified: Secondary | ICD-10-CM | POA: Diagnosis not present

## 2019-03-13 ENCOUNTER — Other Ambulatory Visit: Payer: Self-pay | Admitting: Family Medicine

## 2019-03-13 DIAGNOSIS — R7989 Other specified abnormal findings of blood chemistry: Secondary | ICD-10-CM

## 2019-03-25 ENCOUNTER — Telehealth: Payer: Self-pay | Admitting: Plastic Surgery

## 2019-03-25 NOTE — Telephone Encounter (Signed)
Called patient to confirm appointment scheduled for tomorrow. Patient answered the following questions: °1.Has the patient traveled outside of the state of [place] at all within the past 6 weeks? No °2.Does the patient have a fever or cough at all? No °3.Has the patient been tested for COVID? Had a positive COVID test? No °4. Has the patient been in contact with anyone who has tested positive? No ° °

## 2019-03-26 ENCOUNTER — Encounter: Payer: Self-pay | Admitting: Plastic Surgery

## 2019-03-26 ENCOUNTER — Ambulatory Visit (INDEPENDENT_AMBULATORY_CARE_PROVIDER_SITE_OTHER): Payer: Self-pay | Admitting: Plastic Surgery

## 2019-03-26 ENCOUNTER — Other Ambulatory Visit: Payer: Self-pay

## 2019-03-26 DIAGNOSIS — Z719 Counseling, unspecified: Secondary | ICD-10-CM

## 2019-03-26 NOTE — Progress Notes (Signed)
Preoperative Dx: facial aging  Postoperative Dx:  same  Procedure: laser to face   Anesthesia: none  Description of Procedure:  Risks and complications were explained to the patient. Consent was confirmed and signed. Time out was called and all information was confirmed to be correct. The area  area was prepped with alcohol and wiped dry. The Frax laser was set at 40. The face was lasered. The patient tolerated the procedure well and there were no complications. The patient is to follow up in 4 weeks.

## 2019-04-18 ENCOUNTER — Other Ambulatory Visit: Payer: Medicare HMO

## 2019-04-24 ENCOUNTER — Telehealth: Payer: Self-pay | Admitting: Plastic Surgery

## 2019-04-24 NOTE — Telephone Encounter (Signed)
Called patient to confirm appointment scheduled for tomorrow. Patient answered the following questions: °1.Has the patient traveled outside of the state of [place] at all within the past 6 weeks? No °2.Does the patient have a fever or cough at all? No °3.Has the patient been tested for COVID? Had a positive COVID test? No °4. Has the patient been in contact with anyone who has tested positive? No ° °

## 2019-04-25 ENCOUNTER — Other Ambulatory Visit: Payer: Self-pay

## 2019-04-25 ENCOUNTER — Encounter: Payer: Self-pay | Admitting: Plastic Surgery

## 2019-04-25 ENCOUNTER — Ambulatory Visit (INDEPENDENT_AMBULATORY_CARE_PROVIDER_SITE_OTHER): Payer: Self-pay | Admitting: Plastic Surgery

## 2019-04-25 DIAGNOSIS — Z719 Counseling, unspecified: Secondary | ICD-10-CM

## 2019-04-25 NOTE — Progress Notes (Signed)
Preoperative Dx: facial aging  Postoperative Dx:  same  Procedure: laser to face   Anesthesia: none  Description of Procedure:  Risks and complications were explained to the patient. Consent was confirmed and signed. Time out was called and all information was confirmed to be correct. The area  area was prepped with alcohol and wiped dry. The Frax laser was set at 40. The face was lasered. The patient tolerated the procedure well and there were no complications. The patient is to follow up in 4 weeks.

## 2019-05-01 ENCOUNTER — Other Ambulatory Visit: Payer: Medicare HMO

## 2019-05-27 DIAGNOSIS — L738 Other specified follicular disorders: Secondary | ICD-10-CM | POA: Diagnosis not present

## 2019-06-02 DIAGNOSIS — Z1159 Encounter for screening for other viral diseases: Secondary | ICD-10-CM | POA: Diagnosis not present

## 2019-06-10 ENCOUNTER — Other Ambulatory Visit: Payer: Self-pay | Admitting: Family Medicine

## 2019-06-10 ENCOUNTER — Other Ambulatory Visit: Payer: Medicare HMO

## 2019-06-10 ENCOUNTER — Telehealth: Payer: Self-pay | Admitting: Plastic Surgery

## 2019-06-10 ENCOUNTER — Ambulatory Visit
Admission: RE | Admit: 2019-06-10 | Discharge: 2019-06-10 | Disposition: A | Discharge status: Home / Self Care | Payer: Medicare HMO | Source: Ambulatory Visit | Attending: Family Medicine | Admitting: Family Medicine

## 2019-06-10 DIAGNOSIS — R7989 Other specified abnormal findings of blood chemistry: Secondary | ICD-10-CM

## 2019-06-10 DIAGNOSIS — Z1231 Encounter for screening mammogram for malignant neoplasm of breast: Secondary | ICD-10-CM

## 2019-06-10 DIAGNOSIS — E042 Nontoxic multinodular goiter: Secondary | ICD-10-CM | POA: Diagnosis not present

## 2019-06-10 NOTE — Telephone Encounter (Signed)

## 2019-06-11 ENCOUNTER — Ambulatory Visit (INDEPENDENT_AMBULATORY_CARE_PROVIDER_SITE_OTHER): Payer: Self-pay | Admitting: Plastic Surgery

## 2019-06-11 ENCOUNTER — Other Ambulatory Visit: Payer: Self-pay

## 2019-06-11 ENCOUNTER — Encounter: Payer: Self-pay | Admitting: Plastic Surgery

## 2019-06-11 VITALS — BP 128/79 | HR 80 | Temp 98.1°F

## 2019-06-11 DIAGNOSIS — Z719 Counseling, unspecified: Secondary | ICD-10-CM

## 2019-06-11 NOTE — Progress Notes (Signed)
Preoperative Dx: facial wrinkles  Postoperative Dx:  same  Procedure: laser to face   Anesthesia: none  Description of Procedure:  Risks and complications were explained to the patient. Consent was confirmed and signed. Time out was called and all information was confirmed to be correct. The area  area was prepped with alcohol and wiped dry. The Frax laser was set at 40. The face was lasered. The patient tolerated the procedure well and there were no complications. The patient is to follow up in 6 months.

## 2019-06-12 ENCOUNTER — Other Ambulatory Visit: Payer: Medicare HMO

## 2019-06-19 ENCOUNTER — Other Ambulatory Visit: Payer: Self-pay | Admitting: Family Medicine

## 2019-06-19 DIAGNOSIS — E041 Nontoxic single thyroid nodule: Secondary | ICD-10-CM

## 2019-06-23 IMAGING — MG 2D DIGITAL SCREENING BILATERAL MAMMOGRAM WITH CAD AND ADJUNCT TO
8 series · 9 of 20 positions shown · non-contrast
Comparison: Previous exam(s).

CLINICAL DATA: Screening.

EXAM:
2D DIGITAL SCREENING BILATERAL MAMMOGRAM WITH CAD AND ADJUNCT TOMO

[R MLO synth-2D]
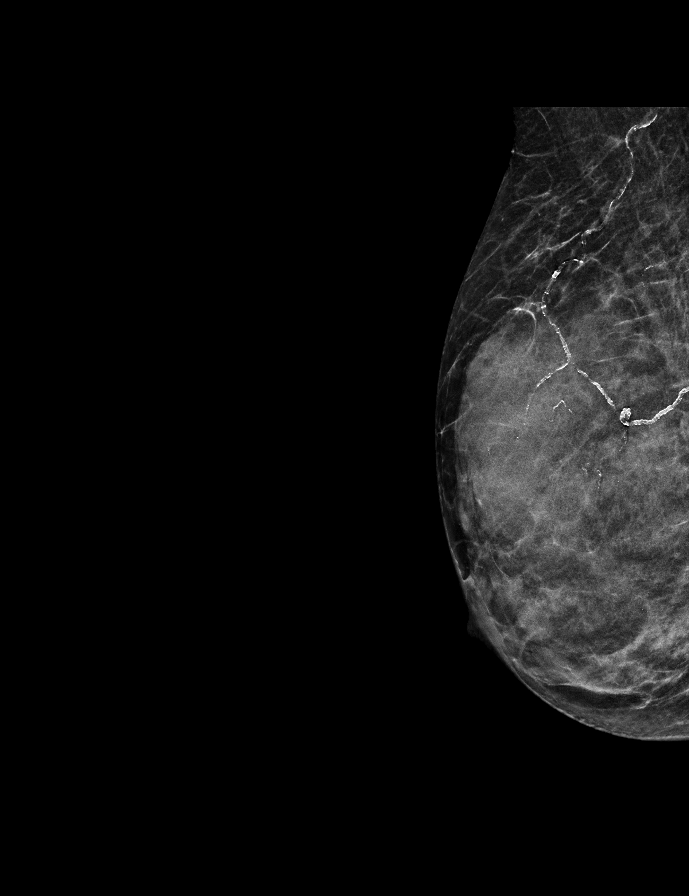

[R CC synth-2D]
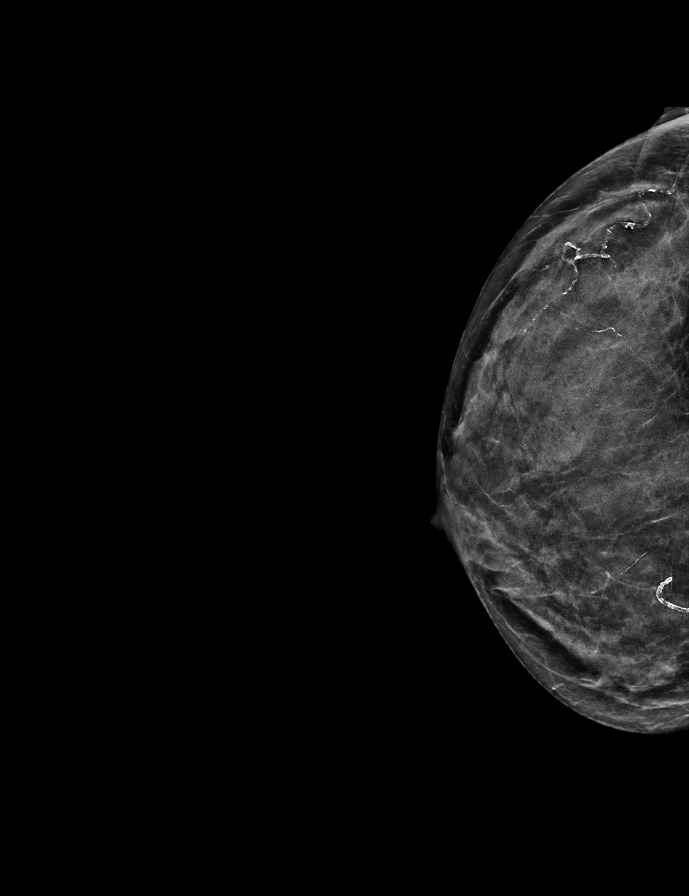

[R MLO]
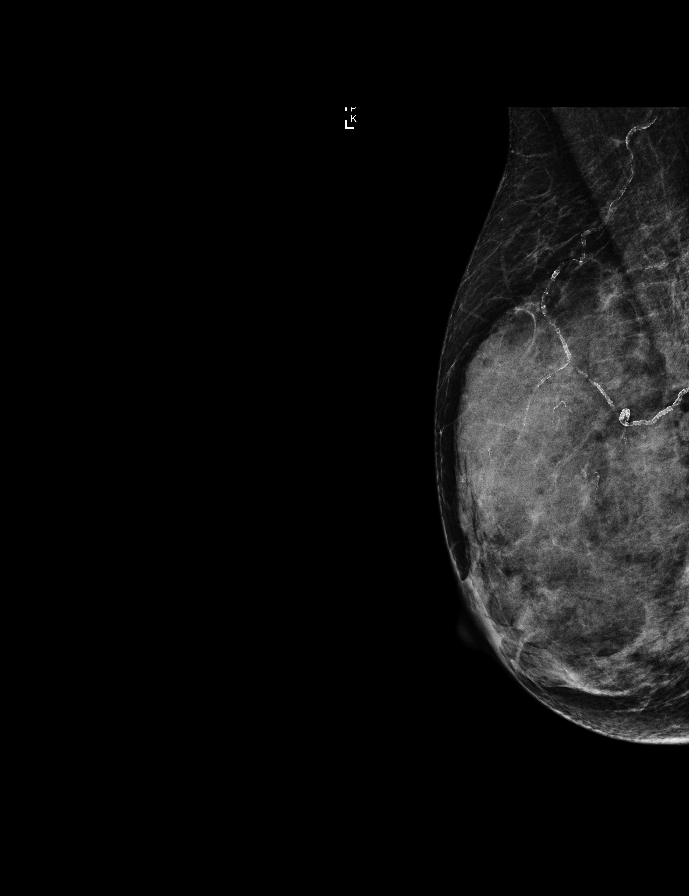

[L MLO]
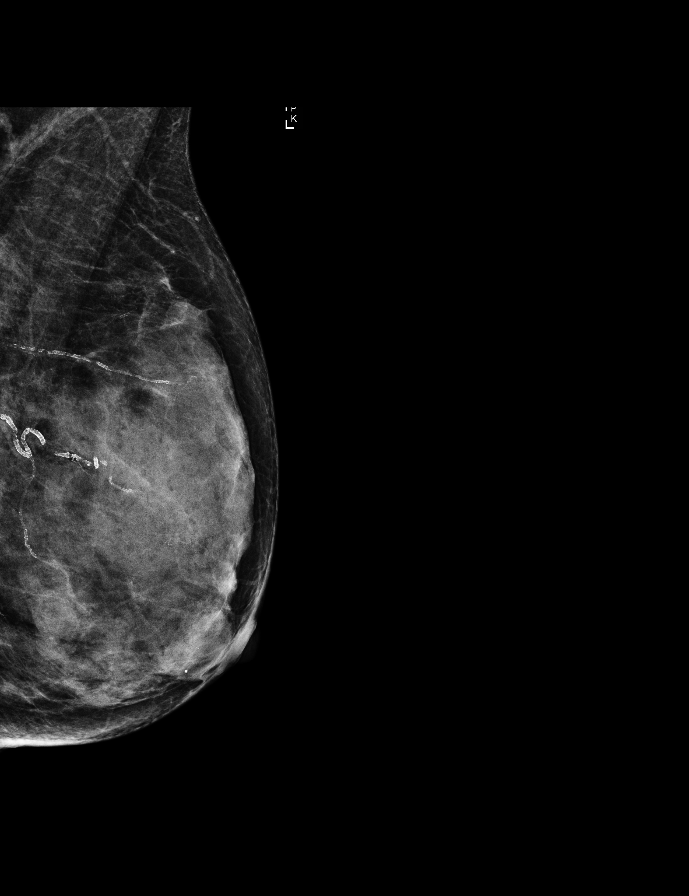

[R CC]
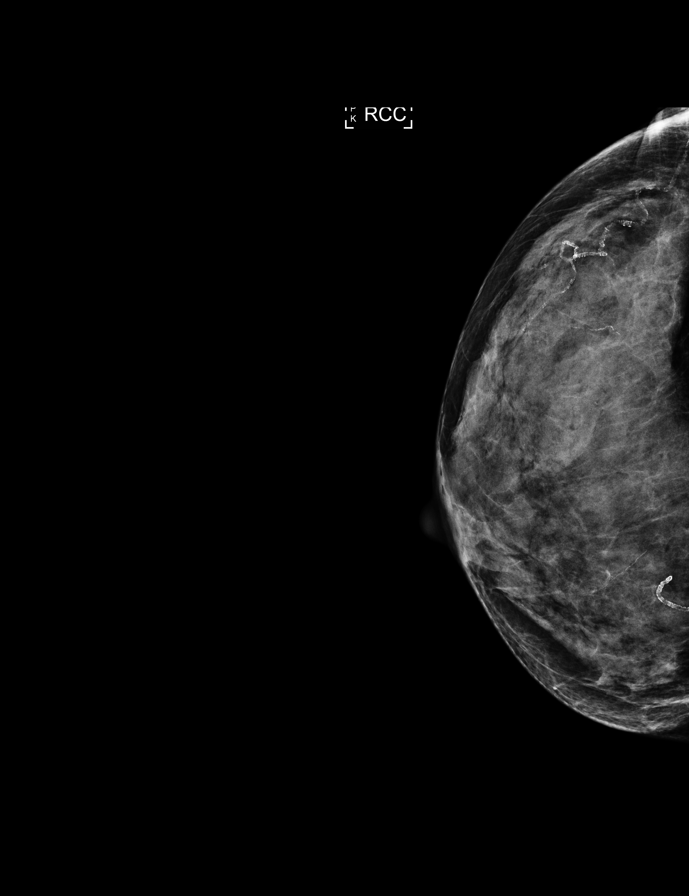

[R CC tomo · 2 of 41 frames shown]
[frame 14/41]
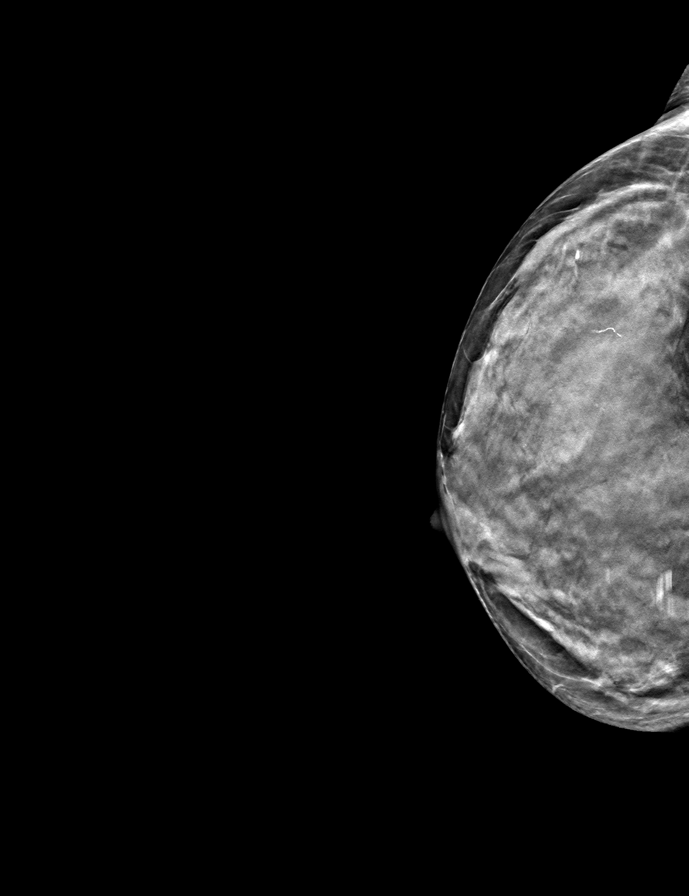
[frame 21/41]
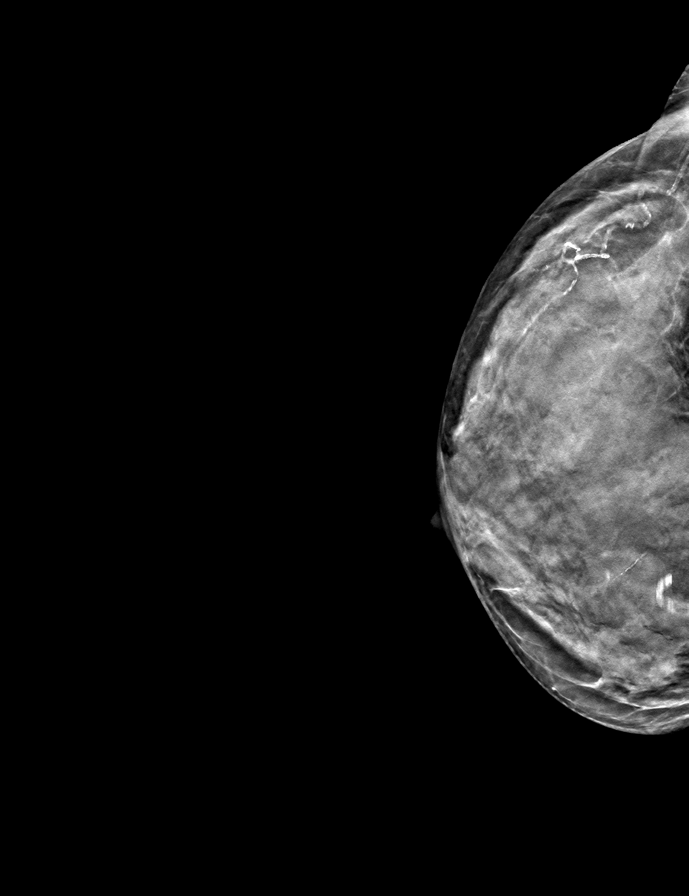

[L CC tomo · tomo slice 15/28.0]
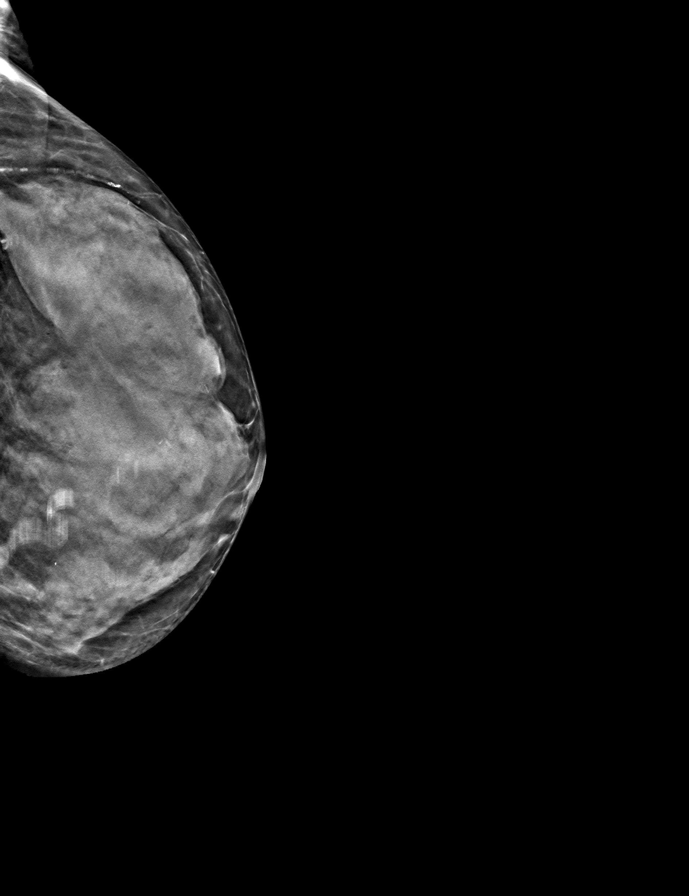

[L MLO tomo · tomo slice 21/41.0]
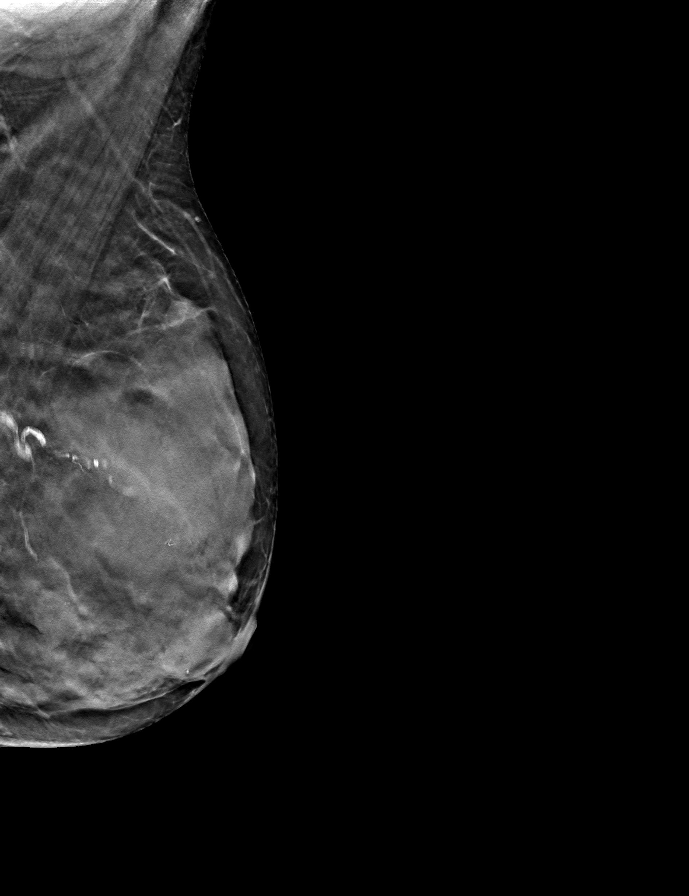

[9 of 20 positions shown; findings below may reference images not displayed]

ACR Breast Density Category d: The breast tissue is extremely dense,
which lowers the sensitivity of mammography.
FINDINGS: There are no findings suspicious for malignancy. Images were
processed with CAD.
IMPRESSION: No mammographic evidence of malignancy. A result letter of this
screening mammogram will be mailed directly to the patient.

RECOMMENDATION:
Screening mammogram in one year. (Code:US-D-RZ7)

BI-RADS CATEGORY  1: Negative.

## 2019-07-05 DIAGNOSIS — Z719 Counseling, unspecified: Secondary | ICD-10-CM

## 2019-07-11 ENCOUNTER — Ambulatory Visit
Admission: RE | Admit: 2019-07-11 | Discharge: 2019-07-11 | Disposition: A | Discharge status: Home / Self Care | Payer: Medicare HMO | Source: Ambulatory Visit | Attending: Family Medicine | Admitting: Family Medicine

## 2019-07-11 ENCOUNTER — Other Ambulatory Visit (HOSPITAL_COMMUNITY)
Admission: RE | Admit: 2019-07-11 | Discharge: 2019-07-11 | Disposition: A | Discharge status: Home / Self Care | Payer: Medicare HMO | Source: Ambulatory Visit | Attending: Radiology | Admitting: Radiology

## 2019-07-11 DIAGNOSIS — E041 Nontoxic single thyroid nodule: Secondary | ICD-10-CM | POA: Insufficient documentation

## 2019-07-16 ENCOUNTER — Other Ambulatory Visit: Payer: Medicare HMO

## 2019-07-25 ENCOUNTER — Ambulatory Visit
Admission: RE | Admit: 2019-07-25 | Discharge: 2019-07-25 | Disposition: A | Discharge status: Home / Self Care | Payer: Medicare HMO | Source: Ambulatory Visit | Attending: Family Medicine | Admitting: Family Medicine

## 2019-07-25 ENCOUNTER — Other Ambulatory Visit: Payer: Self-pay

## 2019-07-25 DIAGNOSIS — Z719 Counseling, unspecified: Secondary | ICD-10-CM

## 2019-07-25 DIAGNOSIS — Z1231 Encounter for screening mammogram for malignant neoplasm of breast: Secondary | ICD-10-CM | POA: Diagnosis not present

## 2019-08-14 ENCOUNTER — Other Ambulatory Visit: Payer: Medicare HMO

## 2019-08-16 DIAGNOSIS — Z719 Counseling, unspecified: Secondary | ICD-10-CM

## 2019-08-20 DIAGNOSIS — M79641 Pain in right hand: Secondary | ICD-10-CM | POA: Diagnosis not present

## 2019-08-20 DIAGNOSIS — M25531 Pain in right wrist: Secondary | ICD-10-CM | POA: Diagnosis not present

## 2019-08-28 DIAGNOSIS — M25531 Pain in right wrist: Secondary | ICD-10-CM | POA: Diagnosis not present

## 2019-09-13 DIAGNOSIS — M25531 Pain in right wrist: Secondary | ICD-10-CM | POA: Diagnosis not present

## 2019-09-25 DIAGNOSIS — H353221 Exudative age-related macular degeneration, left eye, with active choroidal neovascularization: Secondary | ICD-10-CM | POA: Diagnosis not present

## 2019-09-25 DIAGNOSIS — H3530 Unspecified macular degeneration: Secondary | ICD-10-CM | POA: Insufficient documentation

## 2019-09-25 DIAGNOSIS — H3562 Retinal hemorrhage, left eye: Secondary | ICD-10-CM | POA: Diagnosis not present

## 2019-09-25 DIAGNOSIS — H5310 Unspecified subjective visual disturbances: Secondary | ICD-10-CM | POA: Diagnosis not present

## 2019-09-25 DIAGNOSIS — H43812 Vitreous degeneration, left eye: Secondary | ICD-10-CM | POA: Diagnosis not present

## 2019-09-25 DIAGNOSIS — H353112 Nonexudative age-related macular degeneration, right eye, intermediate dry stage: Secondary | ICD-10-CM | POA: Diagnosis not present

## 2019-09-25 DIAGNOSIS — H43811 Vitreous degeneration, right eye: Secondary | ICD-10-CM | POA: Diagnosis not present

## 2019-09-27 DIAGNOSIS — H353112 Nonexudative age-related macular degeneration, right eye, intermediate dry stage: Secondary | ICD-10-CM | POA: Diagnosis not present

## 2019-09-27 DIAGNOSIS — H353221 Exudative age-related macular degeneration, left eye, with active choroidal neovascularization: Secondary | ICD-10-CM | POA: Diagnosis not present

## 2019-09-30 DIAGNOSIS — M25531 Pain in right wrist: Secondary | ICD-10-CM | POA: Diagnosis not present

## 2019-10-16 ENCOUNTER — Ambulatory Visit
Admission: RE | Admit: 2019-10-16 | Discharge: 2019-10-16 | Disposition: A | Discharge status: Home / Self Care | Payer: Medicare HMO | Source: Ambulatory Visit | Attending: Family Medicine | Admitting: Family Medicine

## 2019-10-16 ENCOUNTER — Other Ambulatory Visit: Payer: Self-pay

## 2019-10-16 DIAGNOSIS — M810 Age-related osteoporosis without current pathological fracture: Secondary | ICD-10-CM

## 2019-10-28 DIAGNOSIS — H353221 Exudative age-related macular degeneration, left eye, with active choroidal neovascularization: Secondary | ICD-10-CM | POA: Diagnosis not present

## 2019-10-28 DIAGNOSIS — H353112 Nonexudative age-related macular degeneration, right eye, intermediate dry stage: Secondary | ICD-10-CM | POA: Diagnosis not present

## 2019-10-28 DIAGNOSIS — H43811 Vitreous degeneration, right eye: Secondary | ICD-10-CM | POA: Diagnosis not present

## 2019-10-31 DIAGNOSIS — M25531 Pain in right wrist: Secondary | ICD-10-CM | POA: Diagnosis not present

## 2019-10-31 DIAGNOSIS — M25532 Pain in left wrist: Secondary | ICD-10-CM | POA: Diagnosis not present

## 2019-11-25 DIAGNOSIS — H353112 Nonexudative age-related macular degeneration, right eye, intermediate dry stage: Secondary | ICD-10-CM | POA: Diagnosis not present

## 2019-11-25 DIAGNOSIS — H353221 Exudative age-related macular degeneration, left eye, with active choroidal neovascularization: Secondary | ICD-10-CM | POA: Diagnosis not present

## 2019-11-26 ENCOUNTER — Ambulatory Visit (INDEPENDENT_AMBULATORY_CARE_PROVIDER_SITE_OTHER): Payer: Self-pay | Admitting: Plastic Surgery

## 2019-11-26 ENCOUNTER — Encounter: Payer: Self-pay | Admitting: Plastic Surgery

## 2019-11-26 ENCOUNTER — Other Ambulatory Visit: Payer: Self-pay

## 2019-11-26 VITALS — BP 162/101 | HR 76 | Temp 97.8°F | Ht 62.0 in | Wt 116.0 lb

## 2019-11-26 DIAGNOSIS — Z719 Counseling, unspecified: Secondary | ICD-10-CM

## 2019-11-26 NOTE — Progress Notes (Signed)
Botulinum Toxin and Filler Injection Procedure Note  Procedure: Cosmetic botulinum toxin and Filler administration  Pre-operative Diagnosis: Dynamic rhytides  Post-operative Diagnosis: Same  Complications:  None  Brief history: The patient desires botulinum toxin injection of her forehead. I discussed with the patient this proposed procedure of botulinum toxin injections, which is customized depending on the particular needs of the patient. It is performed on facial rhytids as a temporary correction. The alternatives were discussed with the patient. The risks were addressed including bleeding, scarring, infection, damage to deeper structures, asymmetry, and chronic pain, which may occur infrequently after a procedure. The individual's choice to undergo a surgical procedure is based on the comparison of risks to potential benefits. Other risks include unsatisfactory results, brow ptosis, eyelid ptosis, allergic reaction, temporary paralysis, which should go away with time, bruising, blurring disturbances and delayed healing. Botulinum toxin injections do not arrest the aging process or produce permanent tightening of the eyelid.  Operative intervention maybe necessary to maintain the results of a blepharoplasty or botulinum toxin. The patient understands and wishes to proceed. An informed consent was signed and informational brochures given to her prior to the procedure.  Procedure: The area was prepped with alcohol and dried with a clean gauze. Using a clean technique, the botulinum toxin was diluted with 1.25 cc of preservative-free normal saline which was slowly injected with an 18 gauge needle in a tuberculin syringes.  A 32 gauge needles were then used to inject the botulinum toxin. This mixture allow for an aliquot of 5 units per 0.1 cc in each injection site.    Subsequently the mixture was injected in the glabellar and forehead area with preservation of the temporal branch to the lateral eyebrow  as well as into each lateral canthal area beginning from the lateral orbital rim medial to the zygomaticus major in 3 separate areas. A total of 30 Units of botulinum toxin was used. The forehead and glabellar area was injected with care to inject intramuscular only while holding pressure on the supratrochlear vessels in each area during each injection on either side of the medial corrugators. The injection proceeded vertically superiorly to the medial 2/3 of the frontalis muscle and superior 2/3 of the lateral frontalis, again with preservation of the frontal branch.   No complications were noted. Light pressure was held for 5 minutes. She was instructed explicitly in post-operative care.  Botox LOT:  [id number] [id number] EXP:  8/23

## 2019-12-20 ENCOUNTER — Ambulatory Visit: Payer: Medicare HMO | Attending: Internal Medicine

## 2019-12-20 DIAGNOSIS — Z20822 Contact with and (suspected) exposure to covid-19: Secondary | ICD-10-CM

## 2019-12-21 LAB — NOVEL CORONAVIRUS, NAA: SARS-CoV-2, NAA: NOT DETECTED

## 2019-12-23 DIAGNOSIS — H353221 Exudative age-related macular degeneration, left eye, with active choroidal neovascularization: Secondary | ICD-10-CM | POA: Diagnosis not present

## 2019-12-23 DIAGNOSIS — H43811 Vitreous degeneration, right eye: Secondary | ICD-10-CM | POA: Diagnosis not present

## 2019-12-23 DIAGNOSIS — H353112 Nonexudative age-related macular degeneration, right eye, intermediate dry stage: Secondary | ICD-10-CM | POA: Diagnosis not present

## 2019-12-25 DIAGNOSIS — Z0000 Encounter for general adult medical examination without abnormal findings: Secondary | ICD-10-CM | POA: Diagnosis not present

## 2019-12-25 DIAGNOSIS — M5136 Other intervertebral disc degeneration, lumbar region: Secondary | ICD-10-CM | POA: Diagnosis not present

## 2019-12-25 DIAGNOSIS — H3530 Unspecified macular degeneration: Secondary | ICD-10-CM | POA: Diagnosis not present

## 2019-12-25 DIAGNOSIS — E041 Nontoxic single thyroid nodule: Secondary | ICD-10-CM | POA: Diagnosis not present

## 2019-12-25 DIAGNOSIS — R8290 Unspecified abnormal findings in urine: Secondary | ICD-10-CM | POA: Diagnosis not present

## 2019-12-25 DIAGNOSIS — M810 Age-related osteoporosis without current pathological fracture: Secondary | ICD-10-CM | POA: Diagnosis not present

## 2019-12-25 DIAGNOSIS — E785 Hyperlipidemia, unspecified: Secondary | ICD-10-CM | POA: Diagnosis not present

## 2019-12-25 DIAGNOSIS — E039 Hypothyroidism, unspecified: Secondary | ICD-10-CM | POA: Diagnosis not present

## 2020-01-06 ENCOUNTER — Ambulatory Visit: Payer: Medicare HMO | Attending: Internal Medicine

## 2020-01-06 DIAGNOSIS — Z20822 Contact with and (suspected) exposure to covid-19: Secondary | ICD-10-CM | POA: Diagnosis not present

## 2020-01-07 LAB — NOVEL CORONAVIRUS, NAA: SARS-CoV-2, NAA: NOT DETECTED

## 2020-01-19 ENCOUNTER — Ambulatory Visit: Payer: Medicare HMO | Attending: Internal Medicine

## 2020-01-19 DIAGNOSIS — Z23 Encounter for immunization: Secondary | ICD-10-CM | POA: Insufficient documentation

## 2020-01-19 NOTE — Progress Notes (Signed)
   Covid-19 Vaccination Clinic  Name:  Lori Conrad    MRN: [id number] DOB: March 11, 1946  01/19/2020  Ms. Kliethermes was observed post Covid-19 immunization for 15 minutes without incidence. She was provided with Vaccine Information Sheet and instruction to access the V-Safe system.   Ms. Hubacek was instructed to call 911 with any severe reactions post vaccine: Marland Kitchen Difficulty breathing  . Swelling of your face and throat  . A fast heartbeat  . A bad rash all over your body  . Dizziness and weakness    Immunizations Administered    Name Date Dose VIS Date Route   Pfizer COVID-19 Vaccine 01/19/2020  1:56 PM 0.3 mL 11/22/2019 Intramuscular   Manufacturer: [place]   Lot: [id number]   [place]: [id number]

## 2020-01-20 DIAGNOSIS — H353112 Nonexudative age-related macular degeneration, right eye, intermediate dry stage: Secondary | ICD-10-CM | POA: Diagnosis not present

## 2020-01-20 DIAGNOSIS — H43811 Vitreous degeneration, right eye: Secondary | ICD-10-CM | POA: Diagnosis not present

## 2020-01-20 DIAGNOSIS — H353221 Exudative age-related macular degeneration, left eye, with active choroidal neovascularization: Secondary | ICD-10-CM | POA: Diagnosis not present

## 2020-01-21 DIAGNOSIS — S76311A Strain of muscle, fascia and tendon of the posterior muscle group at thigh level, right thigh, initial encounter: Secondary | ICD-10-CM | POA: Diagnosis not present

## 2020-01-21 DIAGNOSIS — M79651 Pain in right thigh: Secondary | ICD-10-CM | POA: Diagnosis not present

## 2020-02-03 ENCOUNTER — Ambulatory Visit: Payer: Medicare HMO

## 2020-02-03 DIAGNOSIS — H353221 Exudative age-related macular degeneration, left eye, with active choroidal neovascularization: Secondary | ICD-10-CM | POA: Diagnosis not present

## 2020-02-13 ENCOUNTER — Ambulatory Visit: Payer: Medicare HMO | Attending: Internal Medicine

## 2020-02-13 DIAGNOSIS — Z23 Encounter for immunization: Secondary | ICD-10-CM | POA: Insufficient documentation

## 2020-02-13 NOTE — Progress Notes (Signed)
   Covid-19 Vaccination Clinic  Name:  Marlisha Zidek    MRN: [id number] DOB: 10-Aug-1946  02/13/2020  Ms. Zeringue was observed post Covid-19 immunization for 15 minutes without incident. She was provided with Vaccine Information Sheet and instruction to access the V-Safe system.   Ms. Scrivano was instructed to call 911 with any severe reactions post vaccine: Marland Kitchen Difficulty breathing  . Swelling of face and throat  . A fast heartbeat  . A bad rash all over body  . Dizziness and weakness   Immunizations Administered    Name Date Dose VIS Date Route   Pfizer COVID-19 Vaccine 02/13/2020  9:59 AM 0.3 mL 11/22/2019 Intramuscular   Manufacturer: [place]   Lot: [id number]   [place]: [id number]

## 2020-02-17 DIAGNOSIS — M79609 Pain in unspecified limb: Secondary | ICD-10-CM | POA: Diagnosis not present

## 2020-03-02 DIAGNOSIS — H353221 Exudative age-related macular degeneration, left eye, with active choroidal neovascularization: Secondary | ICD-10-CM | POA: Diagnosis not present

## 2020-03-02 DIAGNOSIS — H43811 Vitreous degeneration, right eye: Secondary | ICD-10-CM | POA: Diagnosis not present

## 2020-03-02 DIAGNOSIS — H353112 Nonexudative age-related macular degeneration, right eye, intermediate dry stage: Secondary | ICD-10-CM | POA: Diagnosis not present

## 2020-03-17 DIAGNOSIS — R300 Dysuria: Secondary | ICD-10-CM | POA: Diagnosis not present

## 2020-03-17 DIAGNOSIS — N3001 Acute cystitis with hematuria: Secondary | ICD-10-CM | POA: Diagnosis not present

## 2020-03-26 DIAGNOSIS — H02401 Unspecified ptosis of right eyelid: Secondary | ICD-10-CM | POA: Diagnosis not present

## 2020-03-26 DIAGNOSIS — H353221 Exudative age-related macular degeneration, left eye, with active choroidal neovascularization: Secondary | ICD-10-CM | POA: Diagnosis not present

## 2020-03-26 DIAGNOSIS — H353112 Nonexudative age-related macular degeneration, right eye, intermediate dry stage: Secondary | ICD-10-CM | POA: Diagnosis not present

## 2020-03-26 DIAGNOSIS — G43109 Migraine with aura, not intractable, without status migrainosus: Secondary | ICD-10-CM | POA: Diagnosis not present

## 2020-03-30 DIAGNOSIS — H43813 Vitreous degeneration, bilateral: Secondary | ICD-10-CM | POA: Diagnosis not present

## 2020-03-30 DIAGNOSIS — C44529 Squamous cell carcinoma of skin of other part of trunk: Secondary | ICD-10-CM | POA: Diagnosis not present

## 2020-03-30 DIAGNOSIS — D485 Neoplasm of uncertain behavior of skin: Secondary | ICD-10-CM | POA: Diagnosis not present

## 2020-03-30 DIAGNOSIS — H35033 Hypertensive retinopathy, bilateral: Secondary | ICD-10-CM | POA: Diagnosis not present

## 2020-03-30 DIAGNOSIS — L821 Other seborrheic keratosis: Secondary | ICD-10-CM | POA: Diagnosis not present

## 2020-03-30 DIAGNOSIS — H353112 Nonexudative age-related macular degeneration, right eye, intermediate dry stage: Secondary | ICD-10-CM | POA: Diagnosis not present

## 2020-03-30 DIAGNOSIS — H353221 Exudative age-related macular degeneration, left eye, with active choroidal neovascularization: Secondary | ICD-10-CM | POA: Diagnosis not present

## 2020-03-30 DIAGNOSIS — M7130 Other bursal cyst, unspecified site: Secondary | ICD-10-CM | POA: Diagnosis not present

## 2020-04-06 DIAGNOSIS — H02402 Unspecified ptosis of left eyelid: Secondary | ICD-10-CM | POA: Diagnosis not present

## 2020-04-20 DIAGNOSIS — Z85828 Personal history of other malignant neoplasm of skin: Secondary | ICD-10-CM | POA: Diagnosis not present

## 2020-04-20 DIAGNOSIS — C44529 Squamous cell carcinoma of skin of other part of trunk: Secondary | ICD-10-CM | POA: Diagnosis not present

## 2020-05-25 DIAGNOSIS — H35033 Hypertensive retinopathy, bilateral: Secondary | ICD-10-CM | POA: Diagnosis not present

## 2020-05-25 DIAGNOSIS — H353112 Nonexudative age-related macular degeneration, right eye, intermediate dry stage: Secondary | ICD-10-CM | POA: Diagnosis not present

## 2020-05-25 DIAGNOSIS — H353222 Exudative age-related macular degeneration, left eye, with inactive choroidal neovascularization: Secondary | ICD-10-CM | POA: Diagnosis not present

## 2020-05-25 DIAGNOSIS — H43813 Vitreous degeneration, bilateral: Secondary | ICD-10-CM | POA: Diagnosis not present

## 2020-06-10 ENCOUNTER — Other Ambulatory Visit: Payer: Self-pay | Admitting: Family Medicine

## 2020-06-10 DIAGNOSIS — Z1231 Encounter for screening mammogram for malignant neoplasm of breast: Secondary | ICD-10-CM

## 2020-06-22 ENCOUNTER — Encounter: Payer: Self-pay | Admitting: Plastic Surgery

## 2020-06-22 ENCOUNTER — Other Ambulatory Visit: Payer: Self-pay

## 2020-06-22 ENCOUNTER — Ambulatory Visit (INDEPENDENT_AMBULATORY_CARE_PROVIDER_SITE_OTHER): Payer: Self-pay | Admitting: Plastic Surgery

## 2020-06-22 VITALS — BP 140/85 | HR 67 | Temp 97.3°F

## 2020-06-22 DIAGNOSIS — Z719 Counseling, unspecified: Secondary | ICD-10-CM

## 2020-06-22 NOTE — Progress Notes (Signed)
Preoperative Dx: Hyperpigmentation of the neck and undesirable hair of the chin  Postoperative Dx:  same  Procedure: laser to the neck and chin  Anesthesia: none  Description of Procedure:  Risks and complications were explained to the patient. Consent was confirmed and signed. Time out was called and all information was confirmed to be correct. The area  area was prepped with alcohol and wiped dry. The 600 IPL laser was set and the chin was lasered.  The 530 IPL was then used to laser the hyperpigmentation spot on the neck.  The patient tolerated the procedure well and there were no complications. The patient is to follow up in 4 weeks.   Botulinum Toxin Procedure Note  Procedure: Cosmetic botulinum toxin  Pre-operative Diagnosis: Dynamic rhytides  Post-operative Diagnosis: Same  Complications:  None  Brief history: The patient desires botulinum toxin injection of her forehead. I discussed with the patient this proposed procedure of botulinum toxin injections, which is customized depending on the particular needs of the patient. It is performed on facial rhytids as a temporary correction. The alternatives were discussed with the patient. The risks were addressed including bleeding, scarring, infection, damage to deeper structures, asymmetry, and chronic pain, which may occur infrequently after a procedure. The individual's choice to undergo a surgical procedure is based on the comparison of risks to potential benefits. Other risks include unsatisfactory results, brow ptosis, eyelid ptosis, allergic reaction, temporary paralysis, which should go away with time, bruising, blurring disturbances and delayed healing. Botulinum toxin injections do not arrest the aging process or produce permanent tightening of the eyelid.  Operative intervention maybe necessary to maintain the results of a blepharoplasty or botulinum toxin. The patient understands and wishes to proceed.  Procedure: The area was  prepped with alcohol and dried with a clean gauze. Using a clean technique, the botulinum toxin was diluted with 1.25 cc of preservative-free normal saline which was slowly injected with an 18 gauge needle in a tuberculin syringes.  A 32 gauge needles were then used to inject the botulinum toxin. This mixture allow for an aliquot of 5 units per 0.1 cc in each injection site.    Subsequently the mixture was injected in the glabellar and forehead area with preservation of the temporal branch to the lateral eyebrow as well as into each lateral canthal area beginning from the lateral orbital rim medial to the zygomaticus major in 3 separate areas. A total of 30 Units of botulinum toxin was used. The forehead and glabellar area was injected with care to inject intramuscular only while holding pressure on the supratrochlear vessels in each area during each injection on either side of the medial corrugators. The injection proceeded vertically superiorly to the medial 2/3 of the frontalis muscle and superior 2/3 of the lateral frontalis, again with preservation of the frontal branch.  No complications were noted. Light pressure was held for 5 minutes. She was instructed explicitly in post-operative care.  Botox LOT:  [id number] C4 EXP:  9/23

## 2020-07-17 ENCOUNTER — Encounter: Payer: Self-pay | Admitting: Plastic Surgery

## 2020-07-17 ENCOUNTER — Ambulatory Visit (INDEPENDENT_AMBULATORY_CARE_PROVIDER_SITE_OTHER): Payer: Self-pay | Admitting: Plastic Surgery

## 2020-07-17 ENCOUNTER — Other Ambulatory Visit: Payer: Self-pay

## 2020-07-17 VITALS — BP 141/73

## 2020-07-17 DIAGNOSIS — Z719 Counseling, unspecified: Secondary | ICD-10-CM

## 2020-07-17 NOTE — Progress Notes (Signed)
Botulinum Toxin Procedure Note  Procedure: Cosmetic botulinum toxin   Pre-operative Diagnosis: Dynamic rhytides  Post-operative Diagnosis: Same  Complications:  None  Brief history: The patient desires botulinum toxin injection of her forehead. I discussed with the patient this proposed procedure of botulinum toxin injections, which is customized depending on the particular needs of the patient. It is performed on facial rhytids as a temporary correction. The alternatives were discussed with the patient. The risks were addressed including bleeding, scarring, infection, damage to deeper structures, asymmetry, and chronic pain, which may occur infrequently after a procedure. The individual's choice to undergo a surgical procedure is based on the comparison of risks to potential benefits. Other risks include unsatisfactory results, brow ptosis, eyelid ptosis, allergic reaction, temporary paralysis, which should go away with time, bruising, blurring disturbances and delayed healing. Botulinum toxin injections do not arrest the aging process or produce permanent tightening of the eyelid.  Operative intervention maybe necessary to maintain the results of a blepharoplasty or botulinum toxin. The patient understands and wishes to proceed.  Procedure: The area was prepped with alcohol and dried with a clean gauze. Using a clean technique, the botulinum toxin was diluted with 1.25 cc of preservative-free normal saline which was slowly injected with an 18 gauge needle in a tuberculin syringes.  A 32 gauge needles were then used to inject the botulinum toxin. This mixture allow for an aliquot of 5 units per 0.1 cc in each injection site.    The patient had Botox injection 3 weeks ago.  She noticed some asymmetry.  She is here to see if she can get it pulled out for better symmetry.  The right brow was slightly higher than the left brow.  She also had more wrinkling of the forehead on the right side compared to the  left side.  The left lateral periorbital area was injected.  The glabella area was injected.  In the right upper forehead area was injected.  A total of 16 Units of botulinum toxin was used.  No complications were noted. Light pressure was held for several minutes. She was instructed explicitly in post-operative care.  Botox LOT:  [id number] C4 EXP:  10/23

## 2020-07-21 ENCOUNTER — Ambulatory Visit (INDEPENDENT_AMBULATORY_CARE_PROVIDER_SITE_OTHER): Payer: Self-pay | Admitting: Plastic Surgery

## 2020-07-21 ENCOUNTER — Other Ambulatory Visit: Payer: Self-pay

## 2020-07-21 ENCOUNTER — Encounter: Payer: Self-pay | Admitting: Plastic Surgery

## 2020-07-21 VITALS — BP 122/75 | HR 77 | Temp 98.8°F

## 2020-07-21 DIAGNOSIS — Z719 Counseling, unspecified: Secondary | ICD-10-CM

## 2020-07-21 NOTE — Progress Notes (Signed)
Preoperative Dx: Unwanted hair  Postoperative Dx:  same  Procedure: laser to upper lip and chin  Anesthesia: none  Description of Procedure:  Risks and complications were explained to the patient. Consent was confirmed and signed. Time out was called and all information was confirmed to be correct. The area  area was prepped with alcohol and wiped dry. The IPL 600 laser was set at 16 J/cm2. The upper lip and chin was lasered. The patient tolerated the procedure well and there were no complications. The patient is to follow up in 4 weeks.

## 2020-07-27 ENCOUNTER — Ambulatory Visit
Admission: RE | Admit: 2020-07-27 | Discharge: 2020-07-27 | Disposition: A | Discharge status: Home / Self Care | Payer: Medicare HMO | Source: Ambulatory Visit | Attending: Family Medicine | Admitting: Family Medicine

## 2020-07-27 ENCOUNTER — Other Ambulatory Visit: Payer: Self-pay

## 2020-07-27 DIAGNOSIS — Z1231 Encounter for screening mammogram for malignant neoplasm of breast: Secondary | ICD-10-CM | POA: Diagnosis not present

## 2020-08-14 DIAGNOSIS — Z23 Encounter for immunization: Secondary | ICD-10-CM | POA: Diagnosis not present

## 2020-08-14 DIAGNOSIS — Z1211 Encounter for screening for malignant neoplasm of colon: Secondary | ICD-10-CM | POA: Diagnosis not present

## 2020-08-14 DIAGNOSIS — E039 Hypothyroidism, unspecified: Secondary | ICD-10-CM | POA: Diagnosis not present

## 2020-08-14 DIAGNOSIS — M810 Age-related osteoporosis without current pathological fracture: Secondary | ICD-10-CM | POA: Diagnosis not present

## 2020-08-14 DIAGNOSIS — H3530 Unspecified macular degeneration: Secondary | ICD-10-CM | POA: Diagnosis not present

## 2020-08-14 DIAGNOSIS — Z0000 Encounter for general adult medical examination without abnormal findings: Secondary | ICD-10-CM | POA: Diagnosis not present

## 2020-08-14 DIAGNOSIS — E785 Hyperlipidemia, unspecified: Secondary | ICD-10-CM | POA: Diagnosis not present

## 2020-08-16 DIAGNOSIS — Z23 Encounter for immunization: Secondary | ICD-10-CM | POA: Diagnosis not present

## 2020-08-24 DIAGNOSIS — H353112 Nonexudative age-related macular degeneration, right eye, intermediate dry stage: Secondary | ICD-10-CM | POA: Diagnosis not present

## 2020-08-24 DIAGNOSIS — H353222 Exudative age-related macular degeneration, left eye, with inactive choroidal neovascularization: Secondary | ICD-10-CM | POA: Diagnosis not present

## 2020-09-07 DIAGNOSIS — E039 Hypothyroidism, unspecified: Secondary | ICD-10-CM | POA: Diagnosis not present

## 2020-09-07 DIAGNOSIS — E785 Hyperlipidemia, unspecified: Secondary | ICD-10-CM | POA: Diagnosis not present

## 2020-09-07 DIAGNOSIS — M810 Age-related osteoporosis without current pathological fracture: Secondary | ICD-10-CM | POA: Diagnosis not present

## 2020-09-23 DIAGNOSIS — M545 Low back pain, unspecified: Secondary | ICD-10-CM | POA: Diagnosis not present

## 2020-10-01 DIAGNOSIS — M25531 Pain in right wrist: Secondary | ICD-10-CM | POA: Diagnosis not present

## 2020-10-01 DIAGNOSIS — M13831 Other specified arthritis, right wrist: Secondary | ICD-10-CM | POA: Diagnosis not present

## 2020-10-01 DIAGNOSIS — M13832 Other specified arthritis, left wrist: Secondary | ICD-10-CM | POA: Diagnosis not present

## 2020-10-01 DIAGNOSIS — M545 Low back pain, unspecified: Secondary | ICD-10-CM | POA: Diagnosis not present

## 2020-10-08 DIAGNOSIS — M545 Low back pain, unspecified: Secondary | ICD-10-CM | POA: Diagnosis not present

## 2020-10-15 DIAGNOSIS — M545 Low back pain, unspecified: Secondary | ICD-10-CM | POA: Diagnosis not present

## 2020-10-22 DIAGNOSIS — M545 Low back pain, unspecified: Secondary | ICD-10-CM | POA: Diagnosis not present

## 2020-11-10 DIAGNOSIS — E785 Hyperlipidemia, unspecified: Secondary | ICD-10-CM | POA: Diagnosis not present

## 2020-11-10 DIAGNOSIS — E039 Hypothyroidism, unspecified: Secondary | ICD-10-CM | POA: Diagnosis not present

## 2020-11-10 DIAGNOSIS — M810 Age-related osteoporosis without current pathological fracture: Secondary | ICD-10-CM | POA: Diagnosis not present

## 2020-11-10 DIAGNOSIS — G4700 Insomnia, unspecified: Secondary | ICD-10-CM | POA: Diagnosis not present

## 2020-11-23 DIAGNOSIS — H35033 Hypertensive retinopathy, bilateral: Secondary | ICD-10-CM | POA: Diagnosis not present

## 2020-11-23 DIAGNOSIS — H43813 Vitreous degeneration, bilateral: Secondary | ICD-10-CM | POA: Diagnosis not present

## 2020-11-23 DIAGNOSIS — H353112 Nonexudative age-related macular degeneration, right eye, intermediate dry stage: Secondary | ICD-10-CM | POA: Diagnosis not present

## 2020-11-23 DIAGNOSIS — H353222 Exudative age-related macular degeneration, left eye, with inactive choroidal neovascularization: Secondary | ICD-10-CM | POA: Diagnosis not present

## 2021-01-08 DIAGNOSIS — K090 Developmental odontogenic cysts: Secondary | ICD-10-CM | POA: Diagnosis not present

## 2021-01-08 DIAGNOSIS — Z719 Counseling, unspecified: Secondary | ICD-10-CM

## 2021-01-25 ENCOUNTER — Other Ambulatory Visit: Payer: Medicare HMO | Admitting: Plastic Surgery

## 2021-02-02 DIAGNOSIS — G4700 Insomnia, unspecified: Secondary | ICD-10-CM | POA: Diagnosis not present

## 2021-02-02 DIAGNOSIS — E039 Hypothyroidism, unspecified: Secondary | ICD-10-CM | POA: Diagnosis not present

## 2021-02-02 DIAGNOSIS — E785 Hyperlipidemia, unspecified: Secondary | ICD-10-CM | POA: Diagnosis not present

## 2021-02-02 DIAGNOSIS — M810 Age-related osteoporosis without current pathological fracture: Secondary | ICD-10-CM | POA: Diagnosis not present

## 2021-02-22 DIAGNOSIS — H353222 Exudative age-related macular degeneration, left eye, with inactive choroidal neovascularization: Secondary | ICD-10-CM | POA: Diagnosis not present

## 2021-02-22 DIAGNOSIS — H353112 Nonexudative age-related macular degeneration, right eye, intermediate dry stage: Secondary | ICD-10-CM | POA: Diagnosis not present

## 2021-03-02 ENCOUNTER — Other Ambulatory Visit: Payer: Self-pay

## 2021-03-02 ENCOUNTER — Encounter: Payer: Self-pay | Admitting: Plastic Surgery

## 2021-03-02 ENCOUNTER — Ambulatory Visit (INDEPENDENT_AMBULATORY_CARE_PROVIDER_SITE_OTHER): Payer: Self-pay | Admitting: Plastic Surgery

## 2021-03-02 DIAGNOSIS — Z719 Counseling, unspecified: Secondary | ICD-10-CM

## 2021-03-02 NOTE — Progress Notes (Addendum)

## 2021-03-16 ENCOUNTER — Other Ambulatory Visit: Payer: Medicare HMO | Admitting: Plastic Surgery

## 2021-03-31 DIAGNOSIS — S63502A Unspecified sprain of left wrist, initial encounter: Secondary | ICD-10-CM | POA: Diagnosis not present

## 2021-03-31 DIAGNOSIS — S60459A Superficial foreign body of unspecified finger, initial encounter: Secondary | ICD-10-CM | POA: Diagnosis not present

## 2021-03-31 DIAGNOSIS — M67442 Ganglion, left hand: Secondary | ICD-10-CM | POA: Diagnosis not present

## 2021-03-31 DIAGNOSIS — M25532 Pain in left wrist: Secondary | ICD-10-CM | POA: Diagnosis not present

## 2021-03-31 DIAGNOSIS — M79642 Pain in left hand: Secondary | ICD-10-CM | POA: Diagnosis not present

## 2021-03-31 DIAGNOSIS — M79641 Pain in right hand: Secondary | ICD-10-CM | POA: Diagnosis not present

## 2021-04-05 DIAGNOSIS — E039 Hypothyroidism, unspecified: Secondary | ICD-10-CM | POA: Diagnosis not present

## 2021-04-05 DIAGNOSIS — E785 Hyperlipidemia, unspecified: Secondary | ICD-10-CM | POA: Diagnosis not present

## 2021-04-05 DIAGNOSIS — G4700 Insomnia, unspecified: Secondary | ICD-10-CM | POA: Diagnosis not present

## 2021-04-05 DIAGNOSIS — M810 Age-related osteoporosis without current pathological fracture: Secondary | ICD-10-CM | POA: Diagnosis not present

## 2021-04-13 DIAGNOSIS — L821 Other seborrheic keratosis: Secondary | ICD-10-CM | POA: Diagnosis not present

## 2021-04-13 DIAGNOSIS — Z85828 Personal history of other malignant neoplasm of skin: Secondary | ICD-10-CM | POA: Diagnosis not present

## 2021-04-13 DIAGNOSIS — D2272 Melanocytic nevi of left lower limb, including hip: Secondary | ICD-10-CM | POA: Diagnosis not present

## 2021-04-13 DIAGNOSIS — D2271 Melanocytic nevi of right lower limb, including hip: Secondary | ICD-10-CM | POA: Diagnosis not present

## 2021-04-13 DIAGNOSIS — D225 Melanocytic nevi of trunk: Secondary | ICD-10-CM | POA: Diagnosis not present

## 2021-05-13 IMAGING — US US THYROID
1 series · 13 of 25 positions shown · non-contrast
Comparison: CT 04/10/2014

CLINICAL DATA: Partial right thyroidectomy.

EXAM:
THYROID ULTRASOUND
TECHNIQUE: Ultrasound examination of the thyroid gland and adjacent soft
tissues was performed.

[Series 1: us thyroid · 0.04mm/px · 13 of 42 slices shown]
[im 1/42]
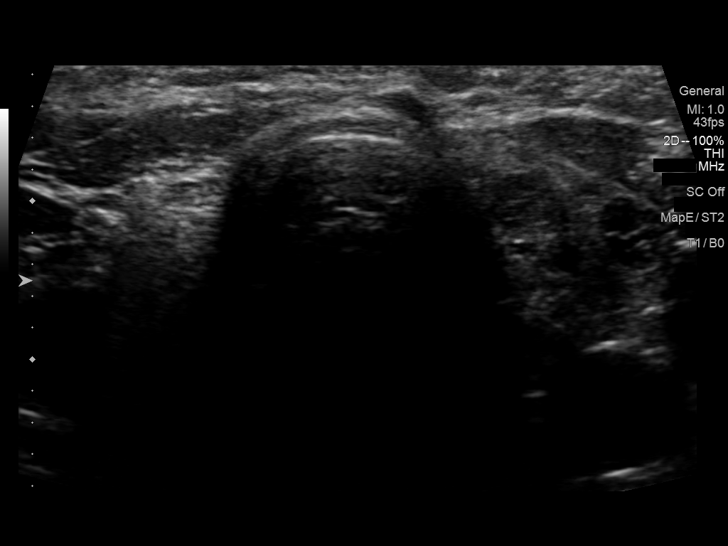
[im 4/42]
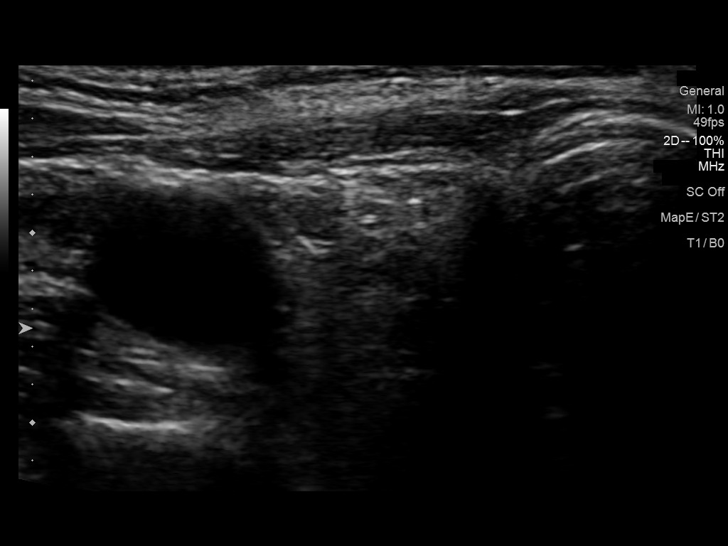
[im 7/42]
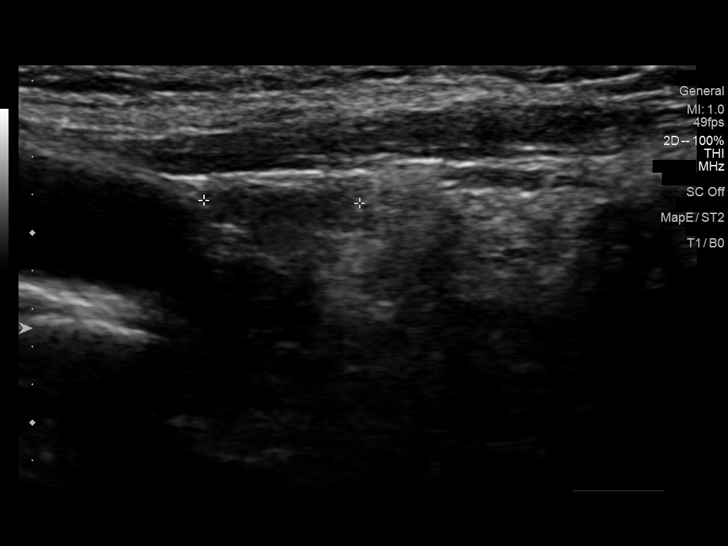
[im 11/42]
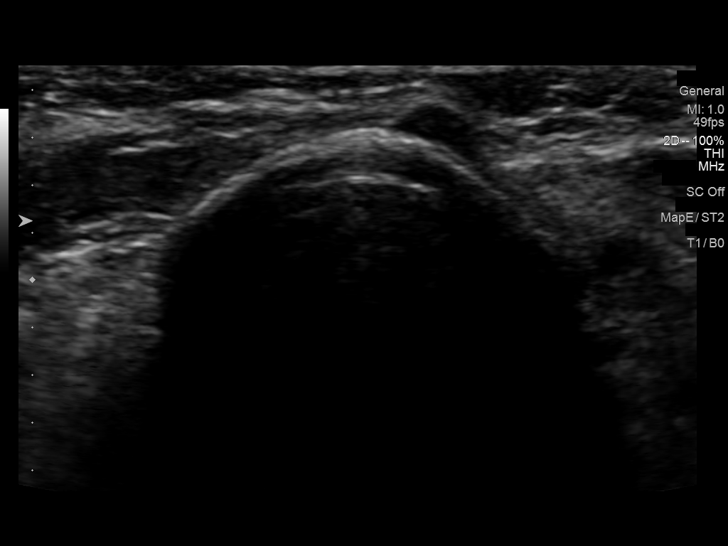
[im 14/42]
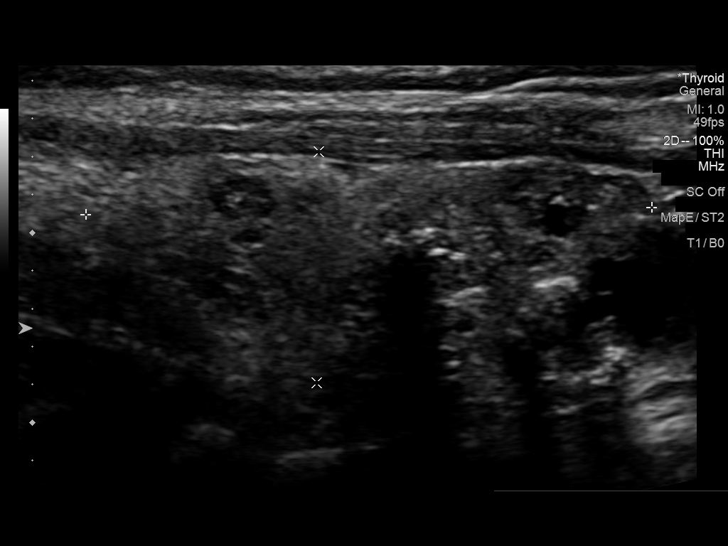
[im 18/42]
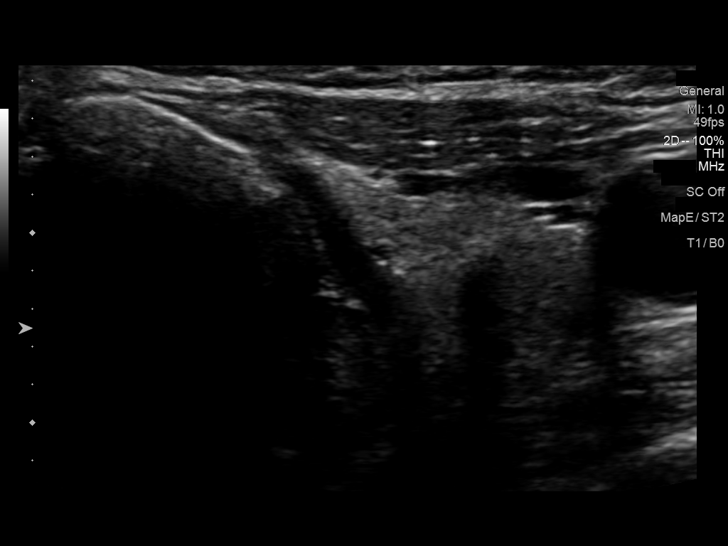
[im 21/42]
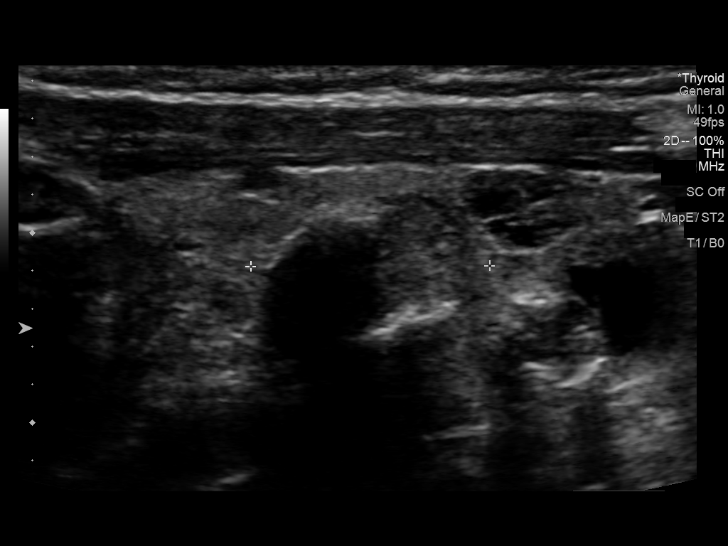
[im 24/42]
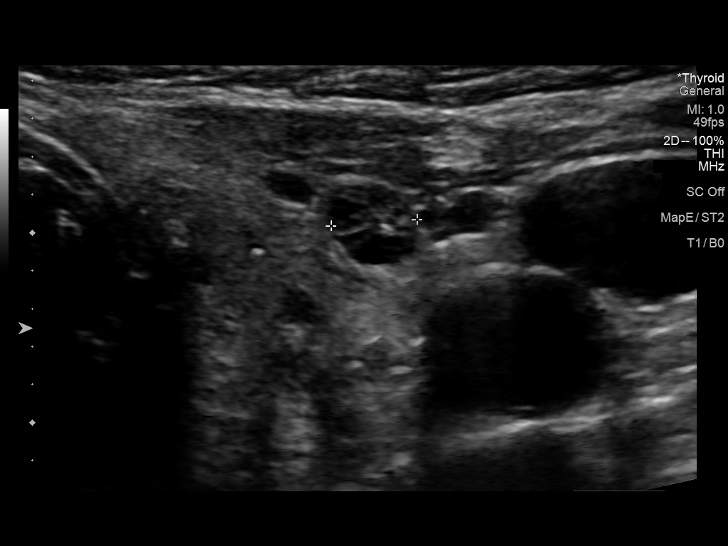
[im 28/42]
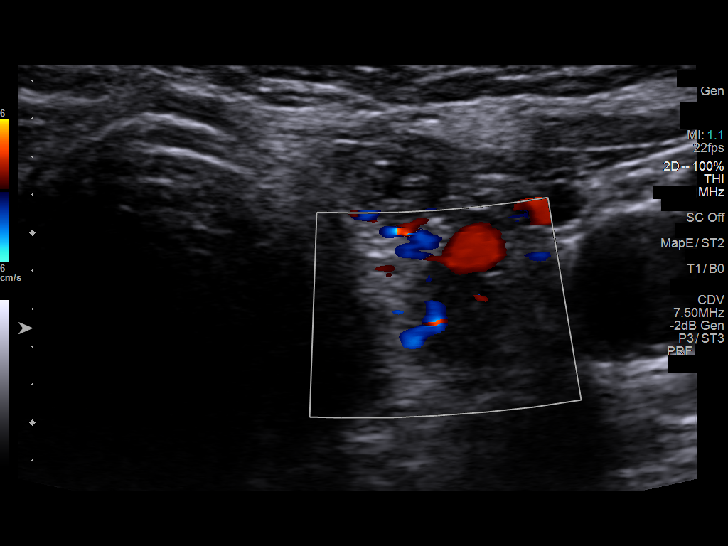
[im 31/42]
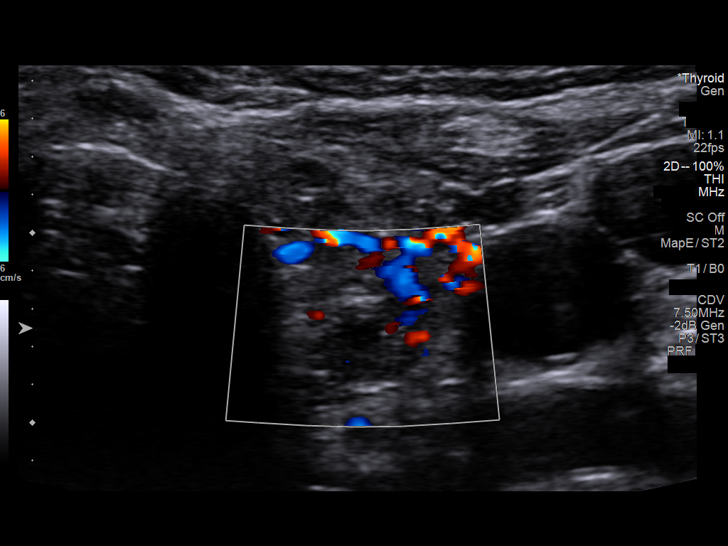
[im 35/42]
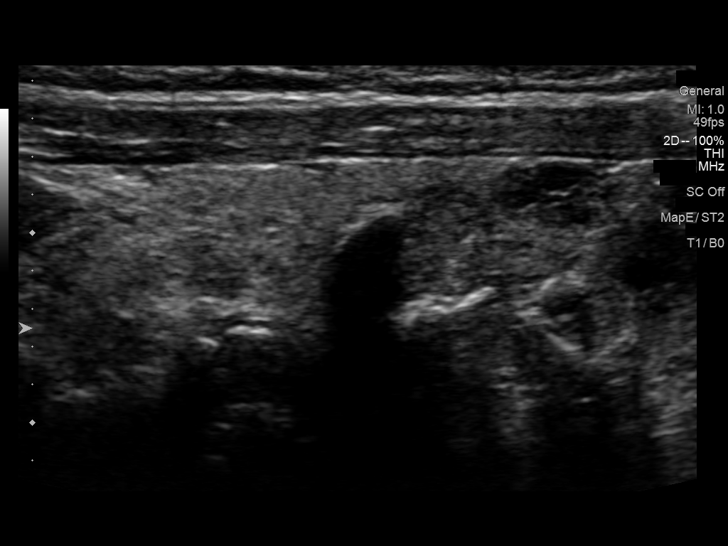
[im 38/42]
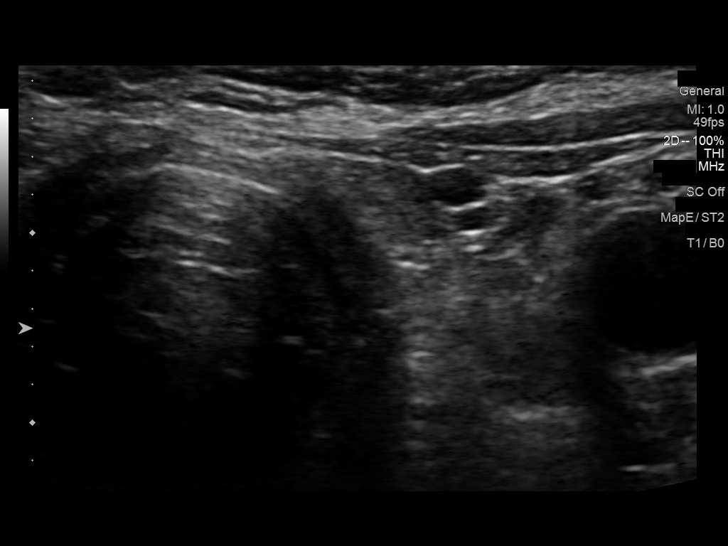
[im 42/42]
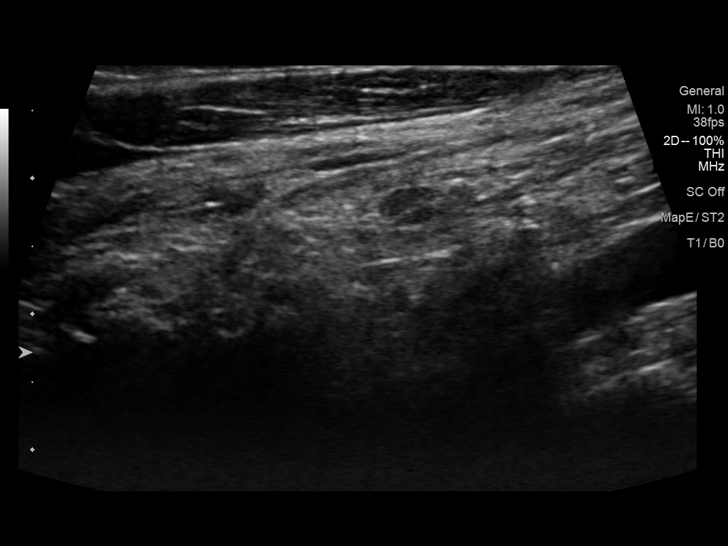

[13 of 25 positions shown; findings below may reference images not displayed]

FINDINGS: Parenchymal Echotexture: Moderately heterogenous

Isthmus: Surgically absent

Right lobe: Surgically absent

Left lobe: 3 x 1.2 x 1.2 cm

_________________________________________________________

Estimated total number of nodules >/= 1 cm: 2

Number of spongiform nodules >/=  2 cm not described below (TR1): 0

Number of mixed cystic and solid nodules >/= 1.5 cm not described
below (TR2): 0

_________________________________________________________

Nodule # 1:

Location: Left; Mid

Maximum size: 1.3 cm; Other 2 dimensions: 0.9 x 0.9 cm

Composition: solid/almost completely solid (2)

Echogenicity: isoechoic (1)

Shape: not taller-than-wide (0)

Margins: smooth (0)

Echogenic foci: peripheral calcifications (2)

ACR TI-RADS total points: 5.

ACR TI-RADS risk category: TR4 (4-6 points).

ACR TI-RADS recommendations:

*Given size (>/= 1 - 1.4 cm) and appearance, a follow-up ultrasound
in 1 year should be considered based on TI-RADS criteria.

_________________________________________________________

0.7 cm mixed solid/cystic nodule without calcifications,
inferolateral left; This nodule does NOT meet TI-RADS criteria for
biopsy or dedicated follow-up.

Nodule # 3:

Location: Left; Inferior

Maximum size: 1.2 cm; Other 2 dimensions: 0.8 x 0.8 cm

Composition: mixed cystic and solid (1)

Echogenicity: isoechoic (1)

Shape: taller-than-wide (3)

Margins: ill-defined (0)

Echogenic foci: peripheral calcifications (2)

ACR TI-RADS total points: 7.

ACR TI-RADS risk category: TR5 (>/= 7 points).

ACR TI-RADS recommendations:

**Given size (>/= 1.0 cm) and appearance, fine needle aspiration of
this highly suspicious nodule should be considered based on TI-RADS
criteria.
IMPRESSION: 1. No definite residual/recurrent tissue post right
hemithyroidectomy.
2. Recommend FNA biopsy of 1.2 cm inferior left TR5 nodule.
3. Recommend annual/biennial ultrasound follow-up of mid left nodule
as above, until stability x5 years confirmed.

The above is in keeping with the ACR TI-RADS recommendations - [HOSPITAL] 1666;[DATE].

## 2021-05-21 ENCOUNTER — Other Ambulatory Visit: Payer: Medicare HMO | Admitting: Plastic Surgery

## 2021-06-01 DIAGNOSIS — Z719 Counseling, unspecified: Secondary | ICD-10-CM

## 2021-06-08 DIAGNOSIS — H35033 Hypertensive retinopathy, bilateral: Secondary | ICD-10-CM | POA: Diagnosis not present

## 2021-06-08 DIAGNOSIS — H43813 Vitreous degeneration, bilateral: Secondary | ICD-10-CM | POA: Diagnosis not present

## 2021-06-08 DIAGNOSIS — H353112 Nonexudative age-related macular degeneration, right eye, intermediate dry stage: Secondary | ICD-10-CM | POA: Diagnosis not present

## 2021-06-08 DIAGNOSIS — H353222 Exudative age-related macular degeneration, left eye, with inactive choroidal neovascularization: Secondary | ICD-10-CM | POA: Diagnosis not present

## 2021-06-13 IMAGING — US ULTRASOUND FNA BIOPSY THYROID 1ST LESION
1 series · 12 of 12 positions shown · non-contrast
Comparison: Thyroid ultrasound dated 06/10/2019

MEDICATIONS:
None

COMPLICATIONS:
None immediate.

INDICATION: Patient with history of prior right hemithyroidectomy for benign
disease and follow-up thyroid ultrasound on 06/10/2019 which
revealed a 1.2 cm left inferior nodule which meets criteria for
biopsy. She presents today for the procedure.

EXAM:
ULTRASOUND GUIDED FINE NEEDLE ASPIRATION BIOPSY OF LEFT INFERIOR
THYROID NODULE
TECHNIQUE: Informed written consent was obtained from the patient after a
discussion of the risks, benefits and alternatives to treatment.
Questions regarding the procedure were encouraged and answered. A
timeout was performed prior to the initiation of the procedure.

[Series 1: ultrasound fna biopsy thyroid 1st lesion · 0.05mm/px · 12 acquisitions, 12 frames shown]
[im 1/12]
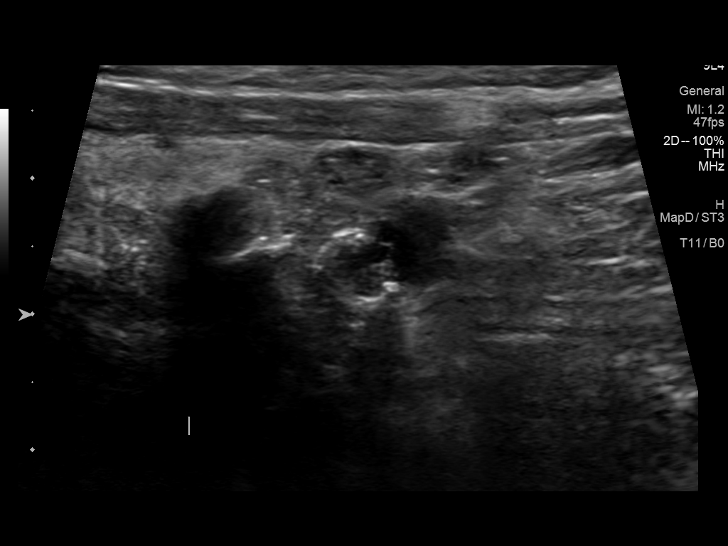
[im 2/12]
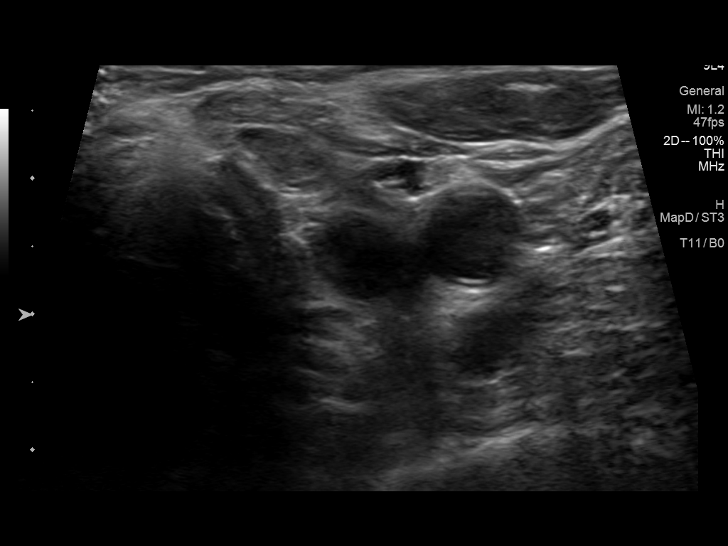
[im 3/12]
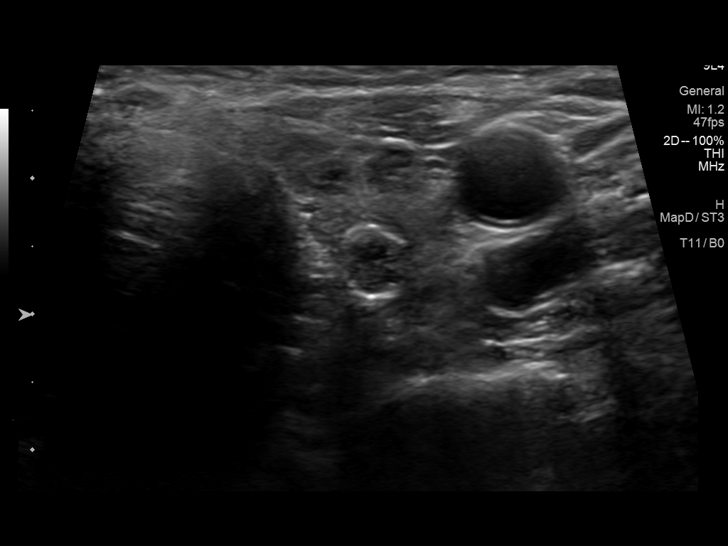
[im 4/12]
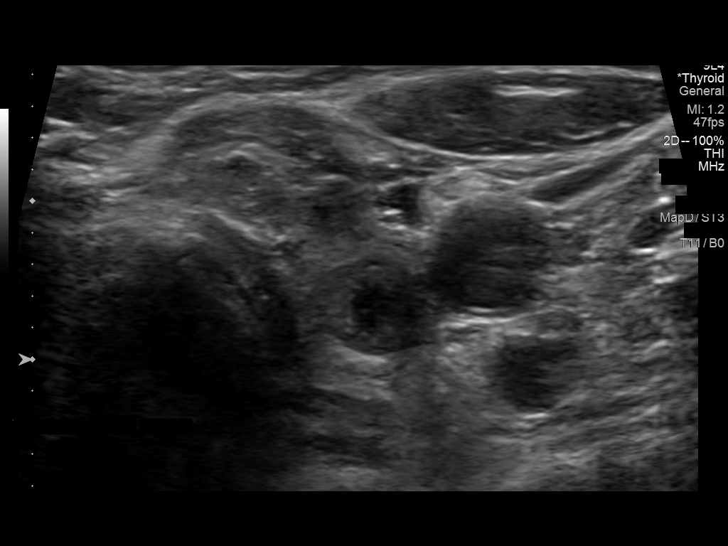
[im 5/12]
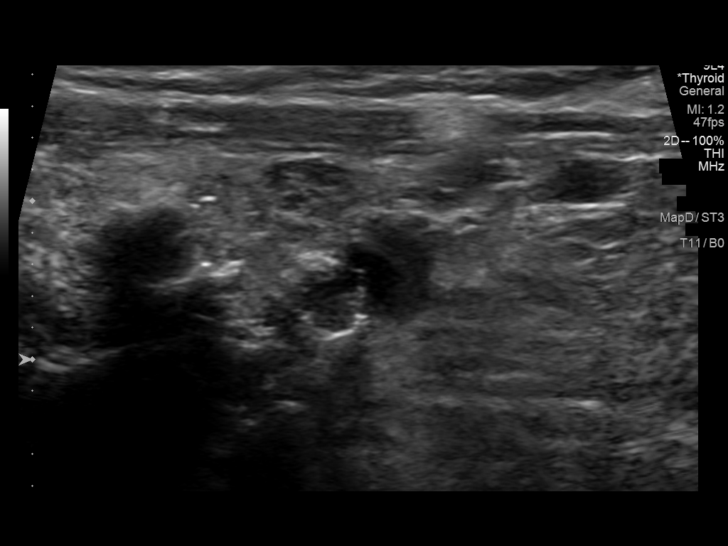
[im 6/12]
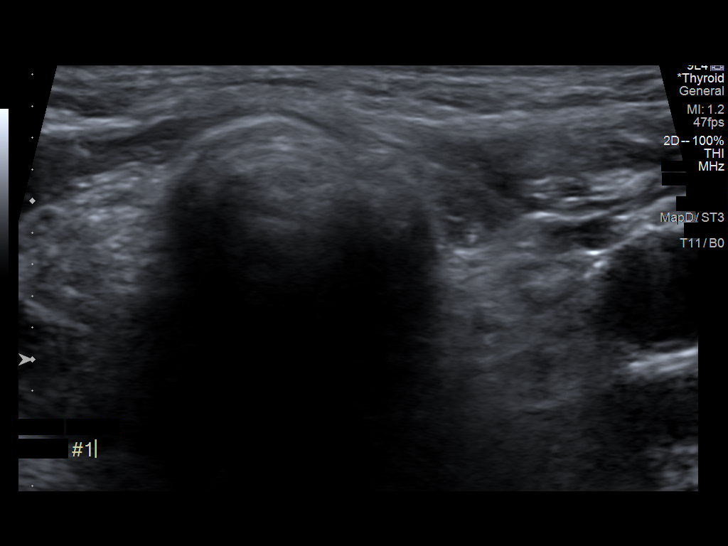
[im 7/12]
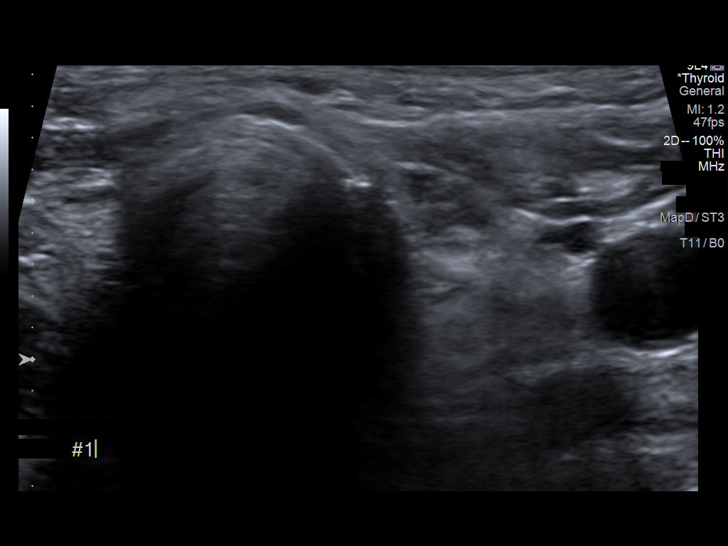
[im 8/12]
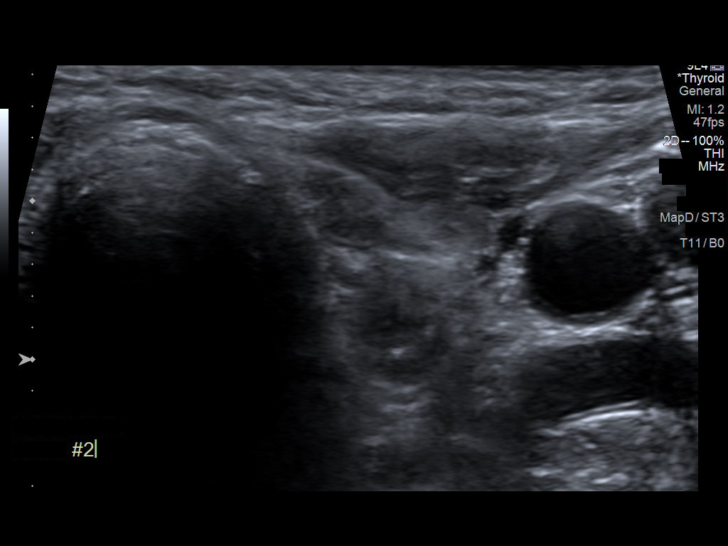
[im 9/12]
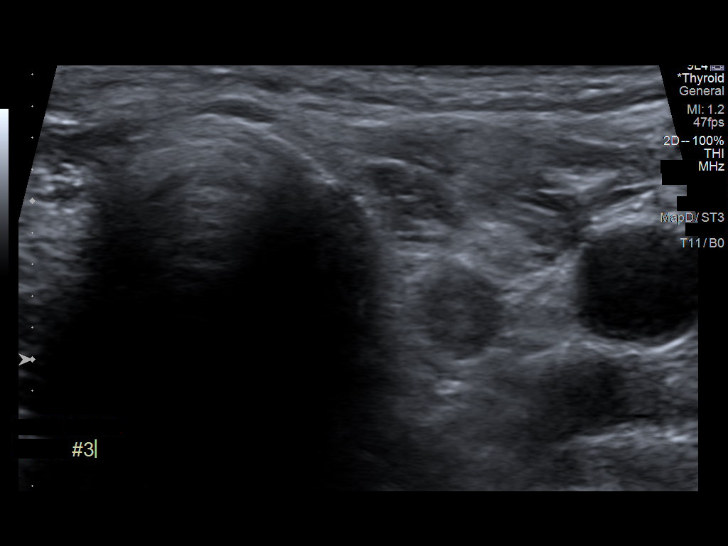
[im 10/12]
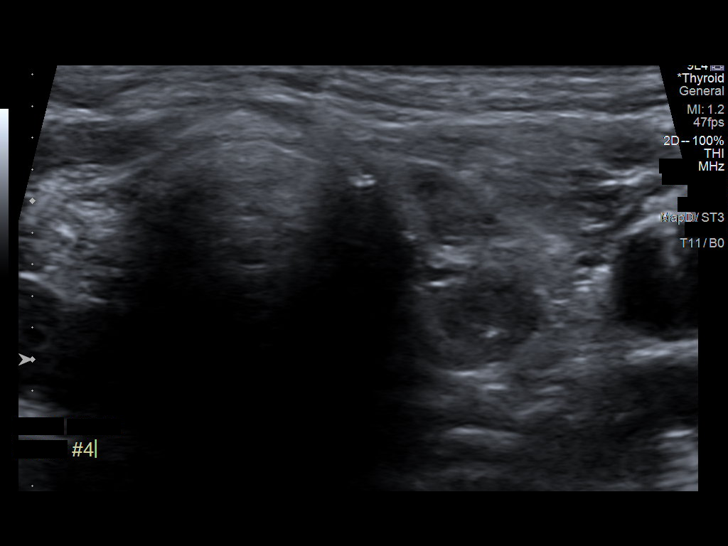
[im 11/12]
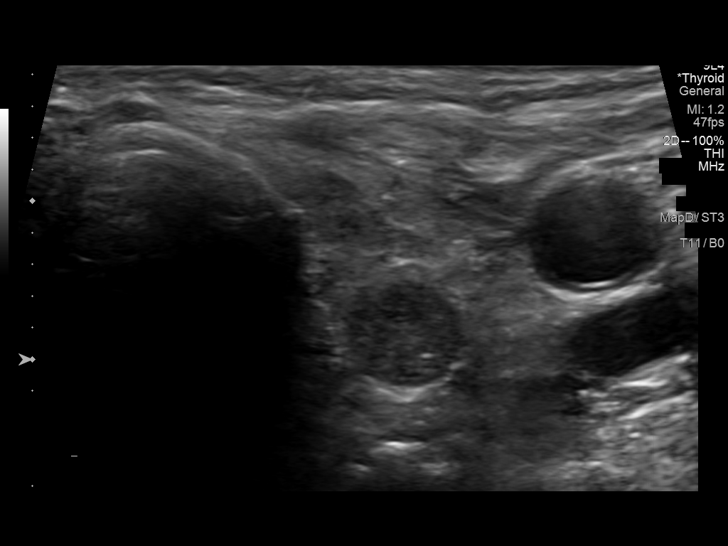
[im 12/12]
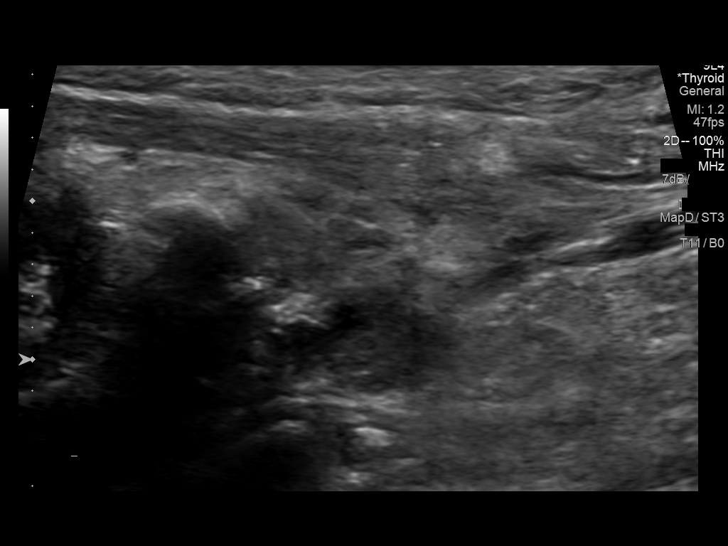

[12 of 12 positions shown; findings below may reference images not displayed]

Pre-procedural ultrasound scanning demonstrated unchanged size and
appearance of the indeterminate nodule within the left inferior
thyroid lobe

The procedure was planned. The neck was prepped in the usual sterile
fashion, and a sterile drape was applied covering the operative
field. A timeout was performed prior to the initiation of the
procedure. Local anesthesia was provided with 1% lidocaine.

Under direct ultrasound guidance, 4 FNA biopsies were performed of
the left inferior thyroid nodule with 25 gauge needles. Multiple
ultrasound images were saved for procedural documentation purposes.
The samples were prepared and submitted to pathology.

Limited post procedural scanning was negative for hematoma or
additional complication. Dressings were placed. The patient
tolerated the above procedures procedure well without immediate
postprocedural complication.
FINDINGS: Nodule reference number based on prior diagnostic ultrasound: 3

Maximum size: 1.2 cm

Location: Left; Inferior

ACR TI-RADS risk category: TR5 (>/= 7 points)

Reason for biopsy: meets ACR TI-RADS criteria

Ultrasound imaging confirms appropriate placement of the needles
within the thyroid nodule.
IMPRESSION: Technically successful ultrasound guided fine needle aspiration
biopsy of left inferior thyroid nodule. Final pathology pending.

## 2021-06-18 ENCOUNTER — Other Ambulatory Visit: Payer: Self-pay

## 2021-06-18 ENCOUNTER — Ambulatory Visit (INDEPENDENT_AMBULATORY_CARE_PROVIDER_SITE_OTHER): Payer: Self-pay | Admitting: Plastic Surgery

## 2021-06-18 ENCOUNTER — Encounter: Payer: Self-pay | Admitting: Plastic Surgery

## 2021-06-18 DIAGNOSIS — Z719 Counseling, unspecified: Secondary | ICD-10-CM

## 2021-06-18 MED ORDER — TETRACAINE POWD: TOPICAL_OINTMENT | Freq: Once | 0 refills | Status: DC

## 2021-06-18 NOTE — Progress Notes (Signed)
[place]  Preoperative Dx: hyperpigmentation of face  Postoperative Dx:  same  Procedure: laser to face   Anesthesia: none  Description of Procedure:  Risks and complications were explained to the patient. Consent was confirmed and signed. Time out was called and all information was confirmed to be correct. The area  area was prepped with alcohol and wiped dry. The BBL laser was set at 7 J/cm2. The face and left ankle were lasered. The patient tolerated the procedure well and there were no complications. The patient is to follow up in 4 weeks.  I will call in lidocaine cream for Halo.

## 2021-06-20 MED ORDER — TETRACAINE POWD
TOPICAL_OINTMENT | Freq: Once | 0 refills | Status: AC
  Filled 2021-06-20: qty 60, 30d supply, fill #0

## 2021-06-20 NOTE — Addendum Note (Signed)
Addended by: Wallace Going on: 06/20/2021 06:14 PM   Modules accepted: Orders

## 2021-06-21 ENCOUNTER — Other Ambulatory Visit (HOSPITAL_COMMUNITY): Payer: Self-pay

## 2021-06-25 DIAGNOSIS — G4700 Insomnia, unspecified: Secondary | ICD-10-CM | POA: Diagnosis not present

## 2021-06-25 DIAGNOSIS — M810 Age-related osteoporosis without current pathological fracture: Secondary | ICD-10-CM | POA: Diagnosis not present

## 2021-06-25 DIAGNOSIS — E039 Hypothyroidism, unspecified: Secondary | ICD-10-CM | POA: Diagnosis not present

## 2021-06-25 DIAGNOSIS — E785 Hyperlipidemia, unspecified: Secondary | ICD-10-CM | POA: Diagnosis not present

## 2021-07-16 ENCOUNTER — Other Ambulatory Visit: Payer: Self-pay

## 2021-07-16 ENCOUNTER — Encounter: Payer: Self-pay | Admitting: Plastic Surgery

## 2021-07-16 ENCOUNTER — Ambulatory Visit (INDEPENDENT_AMBULATORY_CARE_PROVIDER_SITE_OTHER): Payer: Self-pay | Admitting: Plastic Surgery

## 2021-07-16 DIAGNOSIS — Z719 Counseling, unspecified: Secondary | ICD-10-CM

## 2021-07-16 NOTE — Progress Notes (Signed)
Halo Pre and Post-care Instructions  CONGRATULATIONS...you have decided to get the [place] and say 'HALO' Gorgeous! HALO is the first hybrid fractional laser using 1470nm non-ablative and 2940nm ablative laser wavelengths. Healing times and outcomes may vary and are based on the depth and density of the treatment taking into account your skin concerns, the health of your skin and your individual healing ability.   These pre & post care instructions are intended to guide you through the treatment process and get you on your way to gorgeous!  What to Expect & What You Should Do:  FEELING OF WARMTH:  What to expect: The treated area may be extremely warm for 1-2 hours after the treatment. Warmth may continue for 12-24 hours after the treatment. What to do: Cold compresses may provide comfort during this time. Also, a mineral water spray might provide some relief and much needed moisture to the skin.  REDNESS (ERYTHEMA):  What to expect: Redness is normal and expected. Redness generally increases in intensity for the first few days after treatment with day 3 usually being the most intense. Redness can persist for up to 7 days depending on the intensity treatment.  What to do: Use gentle cleansers and keep your skin moisturized and out of the sun which will allow your skin time to heal and limit further stress on your skin.  MENDs: What to expect: MENDs (microscopic epidermal necrotic debris) will appear on the 2nd or 3rd day after treatment as tiny dark spots and bronzed appearance to the treated skin. What to do: MENDs are part of the healing process where treated tissue is working its way out of your body as new fresh skin is regenerated. During this time, your skin will be very dry and feel like sandpaper before faking and peeling off. Keep your skin well moisturized to support the healing process. Do not pick at your skin.  Possible Side Effect:  PINPOINT BLEEDING: What to expect:  Pinpoint bleeding may occur and could last for a few hours and up to 12 hours. What to do: Dab with damp gauze.  SWELLING (EDEMA): What to expect: Swelling is common and expected immediately after treatment.  What to do: Use of a cold compress will help to relieve the swelling. To avoid further swelling, you may choose to sleep in an upright position the first 2-3 nights after the treatment. The first morning post treatment is when swelling is more prevalent, especially under the eyes. Swelling may last 2-4 days. Skin Care Recommendations  PRE TREATMENT: Your doctor recommends these products:   CLEANSING: Cleanse the skin two times a day with plain, lukewarm water and a gentle cleanser, beginning  the morning after the treatment. Use your hands and gentle patting motions.  DO NOT rub, scrub, use an exfoliant, wash cloth or a skin care brush, e.g. Clarisonic in the treated area. Your doctor recommends these products: Cetaphil or CeraVe  MOISTURIZER:  Moisturizer should be applied generously with clean hands over treated area and reapplied whenever  your skin feels dry. Do not apply any other products that were not instructed by your doctor - eg. essential oils,  coconut oil, etc.  Your doctor recommends these products:   SUNSCREEN: Sunscreen is a MUST and should be used daily beginning the day after treatment and used consistently  for up to 3 months post procedure. Use sunscreen with Broadband UVA and UVB protection and a SPF  of 30. Ensure to reapply during sun exposure.  Your  doctor recommends these products:  WATCH-OUTS For general post-treatment discomfort, an over-thecounter oral pain reliever, i.e. Extra Strength Tylenol  might be prescribed by your doctor. If an anti-viral was prescribed, continue to take as directed. Avoid scratching and itching, as scarring and pigmentation complications can occur. Itching can be  relieved by oral Benadryl, but can cause  drowsiness.  WARNING:  There may be some degree of swelling immediately post-treatment, however if you experience excessive  swelling or any of the following signs of infection, you should contact the offce immediately. Signs of  infection include:   - Drainage - looks like pus  - Increased warmth at or around the treated area  - Fever  - Extreme itching  TIPS:  Use soft cloth and soft towels to avoid any scrubbing  Make-up can typically be worn once the peeling process is complete  Wear a wide-brimmed hat or protective clothing for 2 months post treatment to avoid blistering,  scarring, hyperpigmentation and hypopigmentation  When showering, avoid getting shampoo directly on the treated area  Avoid strenuous exercise and sweating until after skin has healed  PRACTICE INFORMATION

## 2021-07-16 NOTE — Progress Notes (Signed)
[place]:  Preoperative Dx: facial Aging  Postoperative Dx:  same  Procedure: laser to face   Anesthesia: none  Description of Procedure:  In Media

## 2021-07-23 ENCOUNTER — Other Ambulatory Visit: Payer: Self-pay

## 2021-07-23 ENCOUNTER — Ambulatory Visit (INDEPENDENT_AMBULATORY_CARE_PROVIDER_SITE_OTHER): Payer: Self-pay | Admitting: Surgical

## 2021-07-23 DIAGNOSIS — Z719 Counseling, unspecified: Secondary | ICD-10-CM

## 2021-07-23 NOTE — Progress Notes (Signed)
   Subjective:     Patient ID: Lori Conrad, female    DOB: 01-14-1946, 75 y.o.   MRN: [id number]  Chief Complaint  Patient presents with   Follow-up    HPI: The patient is a very pleasant 75 year old female here for follow-up after halo laser to her face and neck.  She reports that she is doing really well.  She is so far pleased and reports that the texture of her skin feels very soft and improved.  She still has some swelling.  She is not having any complications.  She has been wearing sunscreen.  Review of Systems  Constitutional: Negative.   Skin: Negative.     Objective:   Vital Signs There were no vitals taken for this visit. Vital Signs and Nursing Note Reviewed Physical Exam Constitutional:      Appearance: Normal appearance. She is normal weight.  HENT:     Head: Normocephalic and atraumatic.  Neurological:     Mental Status: She is alert.   Facial skin is healing nicely, no burns or bruising noted.  Swelling is mildly present as expected.  No MENDs noted.    Assessment/Plan:     ICD-10-CM   1. Encounter for counseling  Z51.12       75 year old patient here for follow-up after halo laser 1 week ago.  She is doing really well, she is overall very pleased with the improvement in her skin texture.  She still has some swelling present.  Recommend continuing with daily sunscreen to help protect skin.  All of her questions were answered to her content.  Recommend calling with questions or concerns. Follow up scheduled for approximately 6 months.  Recommend calling with questions or concerns.   [name] Charie Pinkus, PA-C 07/23/2021, 11:52 AM

## 2021-08-23 DIAGNOSIS — E039 Hypothyroidism, unspecified: Secondary | ICD-10-CM | POA: Diagnosis not present

## 2021-08-23 DIAGNOSIS — I2510 Atherosclerotic heart disease of native coronary artery without angina pectoris: Secondary | ICD-10-CM | POA: Diagnosis not present

## 2021-08-23 DIAGNOSIS — D509 Iron deficiency anemia, unspecified: Secondary | ICD-10-CM | POA: Diagnosis not present

## 2021-08-23 DIAGNOSIS — E785 Hyperlipidemia, unspecified: Secondary | ICD-10-CM | POA: Diagnosis not present

## 2021-08-23 DIAGNOSIS — G4700 Insomnia, unspecified: Secondary | ICD-10-CM | POA: Diagnosis not present

## 2021-08-23 DIAGNOSIS — M810 Age-related osteoporosis without current pathological fracture: Secondary | ICD-10-CM | POA: Diagnosis not present

## 2021-08-27 ENCOUNTER — Other Ambulatory Visit: Payer: Self-pay | Admitting: Family Medicine

## 2021-08-27 DIAGNOSIS — Z1231 Encounter for screening mammogram for malignant neoplasm of breast: Secondary | ICD-10-CM

## 2021-08-31 ENCOUNTER — Ambulatory Visit (INDEPENDENT_AMBULATORY_CARE_PROVIDER_SITE_OTHER): Payer: Self-pay | Admitting: Plastic Surgery

## 2021-08-31 ENCOUNTER — Other Ambulatory Visit: Payer: Self-pay

## 2021-08-31 DIAGNOSIS — I2510 Atherosclerotic heart disease of native coronary artery without angina pectoris: Secondary | ICD-10-CM | POA: Diagnosis not present

## 2021-08-31 DIAGNOSIS — D509 Iron deficiency anemia, unspecified: Secondary | ICD-10-CM | POA: Diagnosis not present

## 2021-08-31 DIAGNOSIS — R739 Hyperglycemia, unspecified: Secondary | ICD-10-CM | POA: Diagnosis not present

## 2021-08-31 DIAGNOSIS — Z23 Encounter for immunization: Secondary | ICD-10-CM | POA: Diagnosis not present

## 2021-08-31 DIAGNOSIS — Z719 Counseling, unspecified: Secondary | ICD-10-CM

## 2021-08-31 DIAGNOSIS — E039 Hypothyroidism, unspecified: Secondary | ICD-10-CM | POA: Diagnosis not present

## 2021-08-31 DIAGNOSIS — H3530 Unspecified macular degeneration: Secondary | ICD-10-CM | POA: Diagnosis not present

## 2021-08-31 DIAGNOSIS — M8580 Other specified disorders of bone density and structure, unspecified site: Secondary | ICD-10-CM | POA: Diagnosis not present

## 2021-08-31 DIAGNOSIS — E785 Hyperlipidemia, unspecified: Secondary | ICD-10-CM | POA: Diagnosis not present

## 2021-08-31 DIAGNOSIS — Z0000 Encounter for general adult medical examination without abnormal findings: Secondary | ICD-10-CM | POA: Diagnosis not present

## 2021-08-31 NOTE — Progress Notes (Signed)

## 2021-09-07 DIAGNOSIS — H353222 Exudative age-related macular degeneration, left eye, with inactive choroidal neovascularization: Secondary | ICD-10-CM | POA: Diagnosis not present

## 2021-09-07 DIAGNOSIS — H353112 Nonexudative age-related macular degeneration, right eye, intermediate dry stage: Secondary | ICD-10-CM | POA: Diagnosis not present

## 2021-10-04 ENCOUNTER — Ambulatory Visit
Admission: RE | Admit: 2021-10-04 | Discharge: 2021-10-04 | Disposition: A | Discharge status: Home / Self Care | Payer: Medicare HMO | Source: Ambulatory Visit | Attending: Family Medicine | Admitting: Family Medicine

## 2021-10-04 ENCOUNTER — Other Ambulatory Visit: Payer: Self-pay

## 2021-10-04 DIAGNOSIS — Z1231 Encounter for screening mammogram for malignant neoplasm of breast: Secondary | ICD-10-CM | POA: Diagnosis not present

## 2021-12-20 DIAGNOSIS — Z719 Counseling, unspecified: Secondary | ICD-10-CM

## 2022-01-10 DIAGNOSIS — H43813 Vitreous degeneration, bilateral: Secondary | ICD-10-CM | POA: Diagnosis not present

## 2022-01-10 DIAGNOSIS — H353112 Nonexudative age-related macular degeneration, right eye, intermediate dry stage: Secondary | ICD-10-CM | POA: Diagnosis not present

## 2022-01-10 DIAGNOSIS — H35033 Hypertensive retinopathy, bilateral: Secondary | ICD-10-CM | POA: Diagnosis not present

## 2022-01-10 DIAGNOSIS — H353222 Exudative age-related macular degeneration, left eye, with inactive choroidal neovascularization: Secondary | ICD-10-CM | POA: Diagnosis not present

## 2022-01-13 DIAGNOSIS — H353131 Nonexudative age-related macular degeneration, bilateral, early dry stage: Secondary | ICD-10-CM | POA: Diagnosis not present

## 2022-01-13 DIAGNOSIS — H52203 Unspecified astigmatism, bilateral: Secondary | ICD-10-CM | POA: Diagnosis not present

## 2022-01-13 DIAGNOSIS — H26491 Other secondary cataract, right eye: Secondary | ICD-10-CM | POA: Diagnosis not present

## 2022-03-01 ENCOUNTER — Ambulatory Visit (INDEPENDENT_AMBULATORY_CARE_PROVIDER_SITE_OTHER): Payer: Self-pay | Admitting: Plastic Surgery

## 2022-03-01 ENCOUNTER — Encounter: Payer: Self-pay | Admitting: Plastic Surgery

## 2022-03-01 ENCOUNTER — Other Ambulatory Visit: Payer: Self-pay

## 2022-03-01 DIAGNOSIS — Z719 Counseling, unspecified: Secondary | ICD-10-CM

## 2022-03-01 NOTE — Progress Notes (Signed)
Botulinum Toxin Procedure Note ? ?Procedure: Cosmetic botulinum toxin  ? ?Pre-operative Diagnosis: Dynamic rhytides  ? ?Post-operative Diagnosis: Same ? ?Complications:  None ? ?Brief history: The patient desires botulinum toxin injection of her forehead. I discussed with the patient this proposed procedure of botulinum toxin injections, which is customized depending on the particular needs of the patient. It is performed on facial rhytids as a temporary correction. The alternatives were discussed with the patient. The risks were addressed including bleeding, scarring, infection, damage to deeper structures, asymmetry, and chronic pain, which may occur infrequently after a procedure. The individual's choice to undergo a surgical procedure is based on the comparison of risks to potential benefits. Other risks include unsatisfactory results, brow ptosis, eyelid ptosis, allergic reaction, temporary paralysis, which should go away with time, bruising, blurring disturbances and delayed healing. Botulinum toxin injections do not arrest the aging process or produce permanent tightening of the eyelid.  Operative intervention maybe necessary to maintain the results of a blepharoplasty or botulinum toxin. The patient understands and wishes to proceed. ? ?Procedure: The area was prepped with alcohol and dried with a clean gauze. Using a clean technique, the botulinum toxin was diluted with 1.25 cc of preservative-free normal saline which was slowly injected with an 18 gauge needle in a tuberculin syringes.  A 32 gauge needles were then used to inject the botulinum toxin. This mixture allow for an aliquot of 4 units per 0.1 cc in each injection site.   ? ?Subsequently the mixture was injected in the glabellar and forehead area with preservation of the temporal branch to the lateral eyebrow as well as into each lateral canthal area beginning from the lateral orbital rim medial to the zygomaticus major in 3 separate areas. A  total of 26 Units of botulinum toxin was used. The forehead and glabellar area was injected with care to inject intramuscular only while holding pressure on the supratrochlear vessels in each area during each injection on either side of the medial corrugators. The injection proceeded vertically superiorly to the medial 2/3 of the frontalis muscle and superior 2/3 of the lateral frontalis, again with preservation of the frontal branch.  No complications were noted. Light pressure was held for 5 minutes. She was instructed explicitly in post-operative care. ? ?Botox ?LOT:  [id number] ?EXP:  3/25 ?

## 2022-03-10 ENCOUNTER — Telehealth: Payer: Self-pay | Admitting: Plastic Surgery

## 2022-03-10 NOTE — Telephone Encounter (Signed)
[facility] is calling to see if Dr. Marla Roe would need visual field plus photos for a pt that is wanting to have eye lid lift and they are needing a order for bleph.  They are needing the order before tomorrow due to the pt has an appt with them at 3 pm. ?

## 2022-03-11 ENCOUNTER — Other Ambulatory Visit: Payer: Self-pay

## 2022-03-11 DIAGNOSIS — H35033 Hypertensive retinopathy, bilateral: Secondary | ICD-10-CM | POA: Diagnosis not present

## 2022-03-11 DIAGNOSIS — H353112 Nonexudative age-related macular degeneration, right eye, intermediate dry stage: Secondary | ICD-10-CM | POA: Diagnosis not present

## 2022-03-11 DIAGNOSIS — H43813 Vitreous degeneration, bilateral: Secondary | ICD-10-CM | POA: Diagnosis not present

## 2022-03-11 DIAGNOSIS — H02831 Dermatochalasis of right upper eyelid: Secondary | ICD-10-CM

## 2022-03-11 DIAGNOSIS — H353222 Exudative age-related macular degeneration, left eye, with inactive choroidal neovascularization: Secondary | ICD-10-CM | POA: Diagnosis not present

## 2022-03-11 NOTE — Telephone Encounter (Signed)
Faxed order to [facility] for only VFT tapped /untapped ?

## 2022-03-15 DIAGNOSIS — H02832 Dermatochalasis of right lower eyelid: Secondary | ICD-10-CM | POA: Diagnosis not present

## 2022-03-15 DIAGNOSIS — H02831 Dermatochalasis of right upper eyelid: Secondary | ICD-10-CM | POA: Diagnosis not present

## 2022-03-15 DIAGNOSIS — H02835 Dermatochalasis of left lower eyelid: Secondary | ICD-10-CM | POA: Diagnosis not present

## 2022-03-15 DIAGNOSIS — H02834 Dermatochalasis of left upper eyelid: Secondary | ICD-10-CM | POA: Diagnosis not present

## 2022-04-04 ENCOUNTER — Ambulatory Visit: Payer: Medicare HMO | Admitting: Plastic Surgery

## 2022-04-04 DIAGNOSIS — H02831 Dermatochalasis of right upper eyelid: Secondary | ICD-10-CM | POA: Diagnosis not present

## 2022-04-04 DIAGNOSIS — Z719 Counseling, unspecified: Secondary | ICD-10-CM

## 2022-04-04 DIAGNOSIS — H02834 Dermatochalasis of left upper eyelid: Secondary | ICD-10-CM | POA: Diagnosis not present

## 2022-04-10 NOTE — Progress Notes (Signed)
? ?  Referring Provider ?Aura Dials, MD ?[address] ?[place],  [place] [address]  ? ?CC:  ?Bilateral eyelid ptosis ? ?Lori Conrad is an 76 y.o. female.  ?HPI: 76 year old bilateral eyelid ptosis.  She notices this when she is looking peripherally.  Her visual field is obstructed.  He is not a tobacco user and has no coronary artery disease.  She was having a laser treatment for macular degeneration ? ?No Known Allergies ? ?Outpatient Encounter Medications as of 04/04/2022  ?Medication Sig  ? atorvastatin (LIPITOR) 40 MG tablet Take 1 tablet (40 mg total) by mouth daily at 6 PM. NEED OV.  ? Calcium Citrate-Vitamin D (CALCIUM CITRATE + D PO) Take by mouth daily. 625m with 400IU  ? Coenzyme Q10 (CO Q-10) 100 MG CAPS Take by mouth daily.  ? glucosamine-chondroitin 500-400 MG tablet Take 3 tablets by mouth daily.  ? Influenza vac split quadrivalent PF (FLUZONE HIGH-DOSE) 0.5 ML injection Fluzone High-Dose 2019-20 (PF) 180 mcg/0.5 mL intramuscular syringe  ? lidocaine-prilocaine (EMLA) cream Apply 1 application topically as needed.  ? Magnesium 250 MG TABS Take 1 tablet by mouth daily.  ? meloxicam (MOBIC) 7.5 MG tablet   ? Multiple Vitamins-Minerals (CENTRUM ADULTS PO) Take by mouth daily.  ? Multiple Vitamins-Minerals (PRESERVISION AREDS 2+MULTI VIT PO) Take 1 tablet by mouth 2 (two) times daily.  ? Omega 3-6-9 Fatty Acids (OMEGA 3-6-9 COMPLEX PO) Take by mouth daily.  ? PFIZER-BIONT COVID-19 VAC-TRIS SUSP injection   ? SYNTHROID 88 MCG tablet Take 1 tablet by mouth daily.  ? Zoster Vaccine Adjuvanted ([facility] injection Shingrix (PF) 50 mcg/0.5 mL intramuscular suspension, kit  ? ?No facility-administered encounter medications on file as of 04/04/2022.  ?  ? ?Past Medical History:  ?Diagnosis Date  ? Hyperlipidemia   ? Hypothyroidism   ? ? ?No past surgical history on file. ? ?Family History  ?Problem Relation Age of Onset  ? Lung cancer Mother   ? Stroke Mother 441 ? Alzheimer's disease Father   ? CAD Father    ?     95% occlusion of L Main & RCA, severe descending aorta, arterial & arteriolonephrosclerosis   ? Cholecystitis Father   ? Esophagitis Father   ? ? ?Social History  ? ?Social History Narrative  ? Not on file  ?  ? ?Review of Systems ?General: Denies fevers, chills, weight loss ?CV: Denies chest pain, shortness of breath, palpitations ? ? ?Physical Exam ? ?  07/21/2020  ? 11:38 AM 07/17/2020  ?  9:05 AM 06/22/2020  ?  9:07 AM  ?Vitals with BMI  ?Systolic [id number] ?Diastolic 75 73 85  ?Pulse 77  67  ?  ?General:  No acute distress,  Alert and oriented, Non-Toxic, Normal speech and affect ?HEENT: MRD 1 mm bilaterally slightly lower on the left. ? ?Assessment/Plan ?Good candidate for blepharoplasty bilateral eyelid ptosis repair. ? ?DLennice Sites?04/10/2022, 3:26 PM  ? ? ?  ?

## 2022-05-10 DIAGNOSIS — I8392 Asymptomatic varicose veins of left lower extremity: Secondary | ICD-10-CM | POA: Diagnosis not present

## 2022-05-10 DIAGNOSIS — Z85828 Personal history of other malignant neoplasm of skin: Secondary | ICD-10-CM | POA: Diagnosis not present

## 2022-05-10 DIAGNOSIS — L821 Other seborrheic keratosis: Secondary | ICD-10-CM | POA: Diagnosis not present

## 2022-05-10 DIAGNOSIS — I8391 Asymptomatic varicose veins of right lower extremity: Secondary | ICD-10-CM | POA: Diagnosis not present

## 2022-05-10 DIAGNOSIS — D1801 Hemangioma of skin and subcutaneous tissue: Secondary | ICD-10-CM | POA: Diagnosis not present

## 2022-05-11 NOTE — Pre-Procedure Instructions (Signed)
Surgical Instructions    Your procedure is scheduled on Tuesday, May 17, 2022 at 1:00 PM.  Report to [facility] Main Entrance "A" at 11:00 A.M., then check in with the Admitting office.  Call this number if you have problems the morning of surgery:  [phone]   If you have any questions prior to your surgery date call [phone]: Open Monday-Friday 8am-4pm    Remember:  Do not eat after midnight the night before your surgery  You may drink clear liquids until 10:00 AM the morning of your surgery.   Clear liquids allowed are: Water, Non-Citrus Juices (without pulp), Carbonated Beverages, Clear Tea, Black Coffee Only (NO MILK, CREAM OR POWDERED CREAMER of any kind), and Gatorade.    Take these medicines the morning of surgery with A SIP OF WATER:  levothyroxine (SYNTHROID) acetaminophen (TYLENOL) - if needed   As of today, STOP taking any Aspirin (unless otherwise instructed by your surgeon) Aleve, Naproxen, Ibuprofen, Motrin, meloxicam (MOBIC), Advil, Goody's, BC's, all herbal medications, fish oil, and all vitamins.                     Do NOT Smoke (Tobacco/Vaping) for 24 hours prior to your procedure.  If you use a CPAP at night, you may bring your mask/headgear for your overnight stay.   Contacts, glasses, piercing's, hearing aid's, dentures or partials may not be worn into surgery, please bring cases for these belongings.    For patients admitted to the hospital, discharge time will be determined by your treatment team.   Patients discharged the day of surgery will not be allowed to drive home, and someone needs to stay with them for 24 hours.  SURGICAL WAITING ROOM VISITATION Patients having surgery or a procedure may have two support people in the waiting area. Visitors may stay in the waiting area during the procedure and switch out with other visitors if needed. Children under the age of 59 must have an adult accompany them who is not the patient. If the patient  needs to stay at the hospital during part of their recovery, the visitor guidelines for inpatient rooms apply.  Please refer to the [facility] website for the visitor guidelines for Inpatients (after your surgery is over and you are in a regular room).    Special instructions:   [facility]- Preparing For Surgery  Before surgery, you can play an important role. Because skin is not sterile, your skin needs to be as free of germs as possible. You can reduce the number of germs on your skin by washing with CHG (chlorahexidine gluconate) Soap before surgery.  CHG is an antiseptic cleaner which kills germs and bonds with the skin to continue killing germs even after washing.    Oral Hygiene is also important to reduce your risk of infection.  Remember - BRUSH YOUR TEETH THE MORNING OF SURGERY WITH YOUR REGULAR TOOTHPASTE  Please do not use if you have an allergy to CHG or antibacterial soaps. If your skin becomes reddened/irritated stop using the CHG.  Do not shave (including legs and underarms) for at least 48 hours prior to first CHG shower. It is OK to shave your face.  Please follow these instructions carefully.   Shower the NIGHT BEFORE SURGERY and the MORNING OF SURGERY  If you chose to wash your hair, wash your hair first as usual with your normal shampoo.  After you shampoo, rinse your hair and body thoroughly to remove the shampoo.  Use  CHG Soap as you would any other liquid soap. You can apply CHG directly to the skin and wash gently with a scrungie or a clean washcloth.   Apply the CHG Soap to your body ONLY FROM THE NECK DOWN.  Do not use on open wounds or open sores. Avoid contact with your eyes, ears, mouth and genitals (private parts). Wash Face and genitals (private parts)  with your normal soap.   Wash thoroughly, paying special attention to the area where your surgery will be performed.  Thoroughly rinse your body with warm water from the neck down.  DO NOT shower/wash  with your normal soap after using and rinsing off the CHG Soap.  Pat yourself dry with a CLEAN TOWEL.  Wear CLEAN PAJAMAS to bed the night before surgery  Place CLEAN SHEETS on your bed the night before your surgery  DO NOT SLEEP WITH PETS.   Day of Surgery: Take a shower with CHG soap. Do not wear jewelry or makeup Do not wear lotions, powders, perfumes, or deodorant. Do not shave 48 hours prior to surgery. Do not bring valuables to the hospital.  [facility] is not responsible for any belongings or valuables. Do not wear nail polish, gel polish, artificial nails, or any other type of covering on natural nails (fingers and toes) If you have artificial nails or gel coating that need to be removed by a nail salon, please have this removed prior to surgery. Artificial nails or gel coating may interfere with anesthesia's ability to adequately monitor your vital signs. Wear Clean/Comfortable clothing the morning of surgery Do not apply any deodorants/lotions.   Remember to brush your teeth WITH YOUR REGULAR TOOTHPASTE.   Please read over the following fact sheets that you were given.  If you received a COVID test during your pre-op visit  it is requested that you wear a mask when out in public, stay away from anyone that may not be feeling well and notify your surgeon if you develop symptoms. If you have been in contact with anyone that has tested positive in the last 10 days please notify you surgeon.

## 2022-05-12 ENCOUNTER — Encounter (HOSPITAL_COMMUNITY)
Admission: RE | Admit: 2022-05-12 | Discharge: 2022-05-12 | Disposition: A | Discharge status: Home / Self Care | Payer: Medicare HMO | Source: Ambulatory Visit | Attending: Plastic Surgery | Admitting: Plastic Surgery

## 2022-05-12 ENCOUNTER — Other Ambulatory Visit: Payer: Self-pay

## 2022-05-12 ENCOUNTER — Encounter: Payer: Self-pay | Admitting: Surgical

## 2022-05-12 ENCOUNTER — Encounter (HOSPITAL_COMMUNITY): Payer: Self-pay

## 2022-05-12 ENCOUNTER — Ambulatory Visit (INDEPENDENT_AMBULATORY_CARE_PROVIDER_SITE_OTHER): Payer: Medicare HMO | Admitting: Surgical

## 2022-05-12 VITALS — BP 120/82 | HR 73 | Temp 98.2°F | Resp 17 | Ht 62.0 in | Wt 111.8 lb

## 2022-05-12 VITALS — BP 152/81 | HR 72 | Ht 62.0 in | Wt 112.4 lb

## 2022-05-12 DIAGNOSIS — Z79899 Other long term (current) drug therapy: Secondary | ICD-10-CM | POA: Insufficient documentation

## 2022-05-12 DIAGNOSIS — Z719 Counseling, unspecified: Secondary | ICD-10-CM

## 2022-05-12 DIAGNOSIS — H02831 Dermatochalasis of right upper eyelid: Secondary | ICD-10-CM

## 2022-05-12 DIAGNOSIS — Z01812 Encounter for preprocedural laboratory examination: Secondary | ICD-10-CM | POA: Insufficient documentation

## 2022-05-12 DIAGNOSIS — Z01818 Encounter for other preprocedural examination: Secondary | ICD-10-CM

## 2022-05-12 DIAGNOSIS — H02834 Dermatochalasis of left upper eyelid: Secondary | ICD-10-CM

## 2022-05-12 LAB — CBC
HCT: 44.4 % (ref 36.0–46.0)
Hemoglobin: 14.3 g/dL (ref 12.0–15.0)
MCH: 28.7 pg (ref 26.0–34.0)
MCHC: 32.2 g/dL (ref 30.0–36.0)
MCV: 89 fL (ref 80.0–100.0)
Platelets: 191 10*3/uL (ref 150–400)
RBC: 4.99 MIL/uL (ref 3.87–5.11)
RDW: 15.1 % (ref 11.5–15.5)
WBC: 5.4 10*3/uL (ref 4.0–10.5)
nRBC: 0 % (ref 0.0–0.2)

## 2022-05-12 MED ORDER — OXYCODONE HCL 5 MG PO TABS: 5.0000 mg | ORAL_TABLET | Freq: Four times a day (QID) | ORAL | 0 refills | Status: AC | PRN

## 2022-05-12 MED ORDER — ONDANSETRON HCL 4 MG PO TABS: 4.0000 mg | ORAL_TABLET | Freq: Three times a day (TID) | ORAL | 0 refills | Status: DC | PRN

## 2022-05-12 NOTE — Progress Notes (Signed)
Patient ID: Lori Conrad, female    DOB: 1946/09/11, 76 y.o.   MRN: [id number]  Chief Complaint  Patient presents with   Pre-op Exam     ICD-10-CM   1. Dermatochalasis of both upper eyelids  H02.831    H02.834      History of Present Illness: Lori Conrad is a 76 y.o.  female  with a history of dermatochalasis of both upper eyelids.  She presents for preoperative evaluation for upcoming procedure, Bilateral upper eyelid blepharoplasty with ptosis repair, scheduled for 05/17/2022 with Dr. Erin Hearing.  The patient has not had problems with anesthesia. No history of DVT/PE.  No family history of DVT/PE.  No family or personal history of bleeding or clotting disorders.  Patient is not currently taking any blood thinners.  No history of CVA/MI.   Summary of Previous Visit: Nontobacco user, no coronary artery disease.  PMH Significant for: Macular degeneration, hyperlipidemia.  She reports she has had some laser treatments for macular degeneration which she reports has stopped the progression, she is going to see a specialist at [facility] after her upper eyelid surgery.  She does not have any specific questions about the procedure, but does have some questions about recovery.  She has been feeling well, no recent changes to her health.   Past Medical History: Allergies: No Known Allergies  Current Medications:  Current Outpatient Medications:    acetaminophen (TYLENOL) 500 MG tablet, Take 500-1,000 mg by mouth every 6 (six) hours as needed for moderate pain., Disp: , Rfl:    atorvastatin (LIPITOR) 40 MG tablet, Take 1 tablet (40 mg total) by mouth daily at 6 PM. NEED OV., Disp: 15 tablet, Rfl: 0   Calcium Carb-Cholecalciferol (SUPER CALCIUM 600 + D 400 PO), Take 1 tablet by mouth 2 (two) times daily., Disp: , Rfl:    Cholecalciferol (VITAMIN D) 50 MCG (2000 UT) tablet, Take 2,000 Units by mouth daily., Disp: , Rfl:    Coenzyme Q10 (COQ10) 400 MG CAPS, Take 400 mg by mouth daily.,  Disp: , Rfl:    doxylamine, Sleep, (UNISOM) 25 MG tablet, Take 25 mg by mouth at bedtime as needed for sleep., Disp: , Rfl:    Glucosamine-Chondroit-Vit C-Mn (GLUCOSAMINE 1500 COMPLEX PO), Take 1 tablet by mouth daily., Disp: , Rfl:    levothyroxine (SYNTHROID) 75 MCG tablet, Take 75 mcg by mouth daily before breakfast., Disp: , Rfl:    Magnesium 250 MG TABS, Take 250 mg by mouth daily., Disp: , Rfl:    meloxicam (MOBIC) 7.5 MG tablet, Take 7.5 mg by mouth daily., Disp: , Rfl:    Multiple Vitamins-Minerals (CENTRUM ADULTS PO), Take 1 tablet by mouth daily., Disp: , Rfl:    Multiple Vitamins-Minerals (PRESERVISION AREDS 2+MULTI VIT PO), Take 1 tablet by mouth 2 (two) times daily., Disp: , Rfl:    Omega-3 Fatty Acids (OMEGA 3 500) 500 MG CAPS, Take 500 mg by mouth daily., Disp: , Rfl:    ondansetron (ZOFRAN) 4 MG tablet, Take 1 tablet (4 mg total) by mouth every 8 (eight) hours as needed for nausea or vomiting., Disp: 20 tablet, Rfl: 0   oxyCODONE (OXY IR/ROXICODONE) 5 MG immediate release tablet, Take 1 tablet (5 mg total) by mouth every 6 (six) hours as needed for up to 5 days for severe pain., Disp: 20 tablet, Rfl: 0  Past Medical Problems: Past Medical History:  Diagnosis Date   Hyperlipidemia    Hypothyroidism     Past Surgical  History: History reviewed. No pertinent surgical history.  Social History: Social History   Socioeconomic History   Marital status: Married    Spouse name: Not on file   Number of children: Not on file   Years of education: Not on file   Highest education level: Not on file  Occupational History   Not on file  Tobacco Use   Smoking status: Former    Types: Cigarettes    Quit date: 05/15/1988    Years since quitting: 34.0   Smokeless tobacco: Never  Substance and Sexual Activity   Alcohol use: Yes    Alcohol/week: 14.0 standard drinks    Types: 14 drink(s) per week    Comment: white wine   Drug use: No   Sexual activity: Not on file  Other Topics  Concern   Not on file  Social History Narrative   Not on file   Social Determinants of Health   Financial Resource Strain: Not on file  Food Insecurity: Not on file  Transportation Needs: Not on file  Physical Activity: Not on file  Stress: Not on file  Social Connections: Not on file  Intimate Partner Violence: Not on file    Family History: Family History  Problem Relation Age of Onset   Lung cancer Mother    Stroke Mother 53   Alzheimer's disease Father    CAD Father        95% occlusion of L Main & RCA, severe descending aorta, arterial & arteriolonephrosclerosis    Cholecystitis Father    Esophagitis Father     Review of Systems: Review of Systems  Constitutional: Negative.   Eyes: Negative.   Gastrointestinal: Negative.   Neurological: Negative.    Physical Exam: Vital Signs BP (!) 152/81 (BP Location: Left Arm, Patient Position: Sitting, Cuff Size: Small)   Pulse 72   Ht '5\' 2"'$  (1.575 m)   Wt 112 lb 6.4 oz (51 kg)   SpO2 97%   BMI 20.56 kg/m   Physical exam Constitutional:      General: Not in acute distress.    Appearance: Normal appearance. Not ill-appearing.  HENT:     Head: Normocephalic and atraumatic.  Eyes:     Pupils: Pupils are equal, round Neck:     Musculoskeletal: Normal range of motion.  Cardiovascular:     Rate and Rhythm: Normal rate    Pulses: Normal pulses.  Pulmonary:     Effort: Pulmonary effort is normal. No respiratory distress.  Abdominal:     General: Abdomen is flat. There is no distension.  Musculoskeletal: Normal range of motion.  Skin:    General: Skin is warm and dry.     Findings: No erythema or rash.  Neurological:     General: No focal deficit present.     Mental Status: Alert and oriented to person, place, and time. Mental status is at baseline.     Motor: No weakness.  Psychiatric:        Mood and Affect: Mood normal.        Behavior: Behavior normal.    Assessment/Plan: The patient is scheduled for  bilateral upper eyelid blepharoplasty with ptosis repair with Dr. Erin Hearing.  Risks, benefits, and alternatives of procedure discussed, questions answered and consent obtained.    Smoking Status: Non-smoker; Counseling Given?  N/A  Caprini Score: 5, high; Risk Factors include: Age, and length of planned surgery. Recommendation for mechanical prophylaxis. Encourage early ambulation.   Pictures were obtained at previous visits.  Post-op Rx sent to pharmacy: Oxycodone, Zofran  Patient was provided with the General Surgical Risk consent document and Pain Medication Agreement prior to their appointment.  They had adequate time to read through the risk consent documents and Pain Medication Agreement. We also discussed them in person together during this preop appointment. All of their questions were answered to their satisfaction.  Recommended calling if they have any further questions.  Risk consent form and Pain Medication Agreement to be scanned into patient's chart.  The risks that can be encountered with and after a blepharoplasty were discussed and include the following but no limited to these:  Asymmetry, dry eyes, lid lag, sensitivity to sun or bright light, difficulty closing your eyes, outward rolling of the eyelid, change in vision, fluid accumulation, firmness of the area, fat necrosis with death of fat tissue, bleeding, infection, delayed healing, anesthesia risks, skin sensation changes, injury to structures including nerves, blood vessels, and muscles which may be temporary or permanent, hair loss, allergies to tape, suture materials and glues, blood products, topical preparations or injected agents, skin and contour irregularities, skin discoloration and swelling, deep vein thrombosis, cardiac and pulmonary complications, pain, which may persist, persistent pain, recurrence, poor healing of the incision, possible need for revisional surgery or staged procedures. Thiere can also be persistent  swelling, poor wound healing, rippling or loose skin, swelling. Any change in weight fluctuations can alter the outcome.  Recommend holding vitamin E, multivitamin, vitamin D, co-Q10, meloxicam/ibuprofen/naproxen prior to surgery.  She will stop taking today.   Electronically signed by: [name] Kesha Hurrell, PA-C 05/12/2022 9:36 AM

## 2022-05-12 NOTE — Progress Notes (Signed)
PCP - Aura Dials MD Cardiologist - denies  PPM/ICD - denies Device Orders -  Rep Notified -   Chest x-ray - na EKG - na Stress Test - 05/20/14 ECHO - denies Cardiac Cath - denies  Sleep Study - deneis CPAP -   Fasting Blood Sugar - na Checks Blood Sugar _____ times a day  Blood Thinner Instructions:na Aspirin Instructions:na  ERAS Protcol -clear liquids until 1000 PRE-SURGERY Ensure or G2- no  COVID TEST- na   Anesthesia review: no  Patient denies shortness of breath, fever, cough and chest pain at PAT appointment   All instructions explained to the patient, with a verbal understanding of the material. Patient agrees to go over the instructions while at home for a better understanding. Patient also instructed to wear a mask when out in public prior to surgery. The opportunity to ask questions was provided.

## 2022-05-12 NOTE — H&P (View-Only) (Signed)
Patient ID: Lori Conrad, female    DOB: Oct 28, 1946, 76 y.o.   MRN: [id number]  Chief Complaint  Patient presents with   Pre-op Exam     ICD-10-CM   1. Dermatochalasis of both upper eyelids  H02.831    H02.834      History of Present Illness: Lori Conrad is a 76 y.o.  female  with a history of dermatochalasis of both upper eyelids.  She presents for preoperative evaluation for upcoming procedure, Bilateral upper eyelid blepharoplasty with ptosis repair, scheduled for 05/17/2022 with Dr. Erin Hearing.  The patient has not had problems with anesthesia. No history of DVT/PE.  No family history of DVT/PE.  No family or personal history of bleeding or clotting disorders.  Patient is not currently taking any blood thinners.  No history of CVA/MI.   Summary of Previous Visit: Nontobacco user, no coronary artery disease.  PMH Significant for: Macular degeneration, hyperlipidemia.  She reports she has had some laser treatments for macular degeneration which she reports has stopped the progression, she is going to see a specialist at [facility] after her upper eyelid surgery.  She does not have any specific questions about the procedure, but does have some questions about recovery.  She has been feeling well, no recent changes to her health.   Past Medical History: Allergies: No Known Allergies  Current Medications:  Current Outpatient Medications:    acetaminophen (TYLENOL) 500 MG tablet, Take 500-1,000 mg by mouth every 6 (six) hours as needed for moderate pain., Disp: , Rfl:    atorvastatin (LIPITOR) 40 MG tablet, Take 1 tablet (40 mg total) by mouth daily at 6 PM. NEED OV., Disp: 15 tablet, Rfl: 0   Calcium Carb-Cholecalciferol (SUPER CALCIUM 600 + D 400 PO), Take 1 tablet by mouth 2 (two) times daily., Disp: , Rfl:    Cholecalciferol (VITAMIN D) 50 MCG (2000 UT) tablet, Take 2,000 Units by mouth daily., Disp: , Rfl:    Coenzyme Q10 (COQ10) 400 MG CAPS, Take 400 mg by mouth daily.,  Disp: , Rfl:    doxylamine, Sleep, (UNISOM) 25 MG tablet, Take 25 mg by mouth at bedtime as needed for sleep., Disp: , Rfl:    Glucosamine-Chondroit-Vit C-Mn (GLUCOSAMINE 1500 COMPLEX PO), Take 1 tablet by mouth daily., Disp: , Rfl:    levothyroxine (SYNTHROID) 75 MCG tablet, Take 75 mcg by mouth daily before breakfast., Disp: , Rfl:    Magnesium 250 MG TABS, Take 250 mg by mouth daily., Disp: , Rfl:    meloxicam (MOBIC) 7.5 MG tablet, Take 7.5 mg by mouth daily., Disp: , Rfl:    Multiple Vitamins-Minerals (CENTRUM ADULTS PO), Take 1 tablet by mouth daily., Disp: , Rfl:    Multiple Vitamins-Minerals (PRESERVISION AREDS 2+MULTI VIT PO), Take 1 tablet by mouth 2 (two) times daily., Disp: , Rfl:    Omega-3 Fatty Acids (OMEGA 3 500) 500 MG CAPS, Take 500 mg by mouth daily., Disp: , Rfl:    ondansetron (ZOFRAN) 4 MG tablet, Take 1 tablet (4 mg total) by mouth every 8 (eight) hours as needed for nausea or vomiting., Disp: 20 tablet, Rfl: 0   oxyCODONE (OXY IR/ROXICODONE) 5 MG immediate release tablet, Take 1 tablet (5 mg total) by mouth every 6 (six) hours as needed for up to 5 days for severe pain., Disp: 20 tablet, Rfl: 0  Past Medical Problems: Past Medical History:  Diagnosis Date   Hyperlipidemia    Hypothyroidism     Past Surgical  History: History reviewed. No pertinent surgical history.  Social History: Social History   Socioeconomic History   Marital status: Married    Spouse name: Not on file   Number of children: Not on file   Years of education: Not on file   Highest education level: Not on file  Occupational History   Not on file  Tobacco Use   Smoking status: Former    Types: Cigarettes    Quit date: 05/15/1988    Years since quitting: 34.0   Smokeless tobacco: Never  Substance and Sexual Activity   Alcohol use: Yes    Alcohol/week: 14.0 standard drinks    Types: 14 drink(s) per week    Comment: white wine   Drug use: No   Sexual activity: Not on file  Other Topics  Concern   Not on file  Social History Narrative   Not on file   Social Determinants of Health   Financial Resource Strain: Not on file  Food Insecurity: Not on file  Transportation Needs: Not on file  Physical Activity: Not on file  Stress: Not on file  Social Connections: Not on file  Intimate Partner Violence: Not on file    Family History: Family History  Problem Relation Age of Onset   Lung cancer Mother    Stroke Mother 28   Alzheimer's disease Father    CAD Father        95% occlusion of L Main & RCA, severe descending aorta, arterial & arteriolonephrosclerosis    Cholecystitis Father    Esophagitis Father     Review of Systems: Review of Systems  Constitutional: Negative.   Eyes: Negative.   Gastrointestinal: Negative.   Neurological: Negative.    Physical Exam: Vital Signs BP (!) 152/81 (BP Location: Left Arm, Patient Position: Sitting, Cuff Size: Small)   Pulse 72   Ht '5\' 2"'$  (1.575 m)   Wt 112 lb 6.4 oz (51 kg)   SpO2 97%   BMI 20.56 kg/m   Physical exam Constitutional:      General: Not in acute distress.    Appearance: Normal appearance. Not ill-appearing.  HENT:     Head: Normocephalic and atraumatic.  Eyes:     Pupils: Pupils are equal, round Neck:     Musculoskeletal: Normal range of motion.  Cardiovascular:     Rate and Rhythm: Normal rate    Pulses: Normal pulses.  Pulmonary:     Effort: Pulmonary effort is normal. No respiratory distress.  Abdominal:     General: Abdomen is flat. There is no distension.  Musculoskeletal: Normal range of motion.  Skin:    General: Skin is warm and dry.     Findings: No erythema or rash.  Neurological:     General: No focal deficit present.     Mental Status: Alert and oriented to person, place, and time. Mental status is at baseline.     Motor: No weakness.  Psychiatric:        Mood and Affect: Mood normal.        Behavior: Behavior normal.    Assessment/Plan: The patient is scheduled for  bilateral upper eyelid blepharoplasty with ptosis repair with Dr. Erin Hearing.  Risks, benefits, and alternatives of procedure discussed, questions answered and consent obtained.    Smoking Status: Non-smoker; Counseling Given?  N/A  Caprini Score: 5, high; Risk Factors include: Age, and length of planned surgery. Recommendation for mechanical prophylaxis. Encourage early ambulation.   Pictures were obtained at previous visits.  Post-op Rx sent to pharmacy: Oxycodone, Zofran  Patient was provided with the General Surgical Risk consent document and Pain Medication Agreement prior to their appointment.  They had adequate time to read through the risk consent documents and Pain Medication Agreement. We also discussed them in person together during this preop appointment. All of their questions were answered to their satisfaction.  Recommended calling if they have any further questions.  Risk consent form and Pain Medication Agreement to be scanned into patient's chart.  The risks that can be encountered with and after a blepharoplasty were discussed and include the following but no limited to these:  Asymmetry, dry eyes, lid lag, sensitivity to sun or bright light, difficulty closing your eyes, outward rolling of the eyelid, change in vision, fluid accumulation, firmness of the area, fat necrosis with death of fat tissue, bleeding, infection, delayed healing, anesthesia risks, skin sensation changes, injury to structures including nerves, blood vessels, and muscles which may be temporary or permanent, hair loss, allergies to tape, suture materials and glues, blood products, topical preparations or injected agents, skin and contour irregularities, skin discoloration and swelling, deep vein thrombosis, cardiac and pulmonary complications, pain, which may persist, persistent pain, recurrence, poor healing of the incision, possible need for revisional surgery or staged procedures. Thiere can also be persistent  swelling, poor wound healing, rippling or loose skin, swelling. Any change in weight fluctuations can alter the outcome.  Recommend holding vitamin E, multivitamin, vitamin D, co-Q10, meloxicam/ibuprofen/naproxen prior to surgery.  She will stop taking today.   Electronically signed by: [name] Jais Demir, PA-C 05/12/2022 9:36 AM

## 2022-05-16 DIAGNOSIS — H43813 Vitreous degeneration, bilateral: Secondary | ICD-10-CM | POA: Diagnosis not present

## 2022-05-16 DIAGNOSIS — H353222 Exudative age-related macular degeneration, left eye, with inactive choroidal neovascularization: Secondary | ICD-10-CM | POA: Diagnosis not present

## 2022-05-16 DIAGNOSIS — H35033 Hypertensive retinopathy, bilateral: Secondary | ICD-10-CM | POA: Diagnosis not present

## 2022-05-16 DIAGNOSIS — H353112 Nonexudative age-related macular degeneration, right eye, intermediate dry stage: Secondary | ICD-10-CM | POA: Diagnosis not present

## 2022-05-17 ENCOUNTER — Other Ambulatory Visit: Payer: Self-pay

## 2022-05-17 ENCOUNTER — Ambulatory Visit (HOSPITAL_COMMUNITY)
Admission: RE | Admit: 2022-05-17 | Discharge: 2022-05-17 | Disposition: A | Discharge status: Home / Self Care | Payer: Medicare HMO | Attending: Plastic Surgery | Admitting: Plastic Surgery

## 2022-05-17 ENCOUNTER — Ambulatory Visit (HOSPITAL_BASED_OUTPATIENT_CLINIC_OR_DEPARTMENT_OTHER): Payer: Medicare HMO | Admitting: Certified Registered Nurse Anesthetist

## 2022-05-17 ENCOUNTER — Encounter (HOSPITAL_COMMUNITY)
Admission: RE | Disposition: A | Discharge status: Home / Self Care | Payer: Self-pay | Source: Home / Self Care | Attending: Plastic Surgery

## 2022-05-17 ENCOUNTER — Ambulatory Visit (HOSPITAL_COMMUNITY): Payer: Medicare HMO | Admitting: Certified Registered Nurse Anesthetist

## 2022-05-17 ENCOUNTER — Encounter (HOSPITAL_COMMUNITY): Payer: Self-pay | Admitting: Plastic Surgery

## 2022-05-17 DIAGNOSIS — H02409 Unspecified ptosis of unspecified eyelid: Secondary | ICD-10-CM

## 2022-05-17 DIAGNOSIS — E039 Hypothyroidism, unspecified: Secondary | ICD-10-CM | POA: Diagnosis not present

## 2022-05-17 DIAGNOSIS — H02831 Dermatochalasis of right upper eyelid: Secondary | ICD-10-CM | POA: Insufficient documentation

## 2022-05-17 DIAGNOSIS — I2510 Atherosclerotic heart disease of native coronary artery without angina pectoris: Secondary | ICD-10-CM

## 2022-05-17 DIAGNOSIS — Z79890 Hormone replacement therapy (postmenopausal): Secondary | ICD-10-CM | POA: Diagnosis not present

## 2022-05-17 DIAGNOSIS — H02839 Dermatochalasis of unspecified eye, unspecified eyelid: Secondary | ICD-10-CM

## 2022-05-17 DIAGNOSIS — H02834 Dermatochalasis of left upper eyelid: Secondary | ICD-10-CM | POA: Insufficient documentation

## 2022-05-17 DIAGNOSIS — Z87891 Personal history of nicotine dependence: Secondary | ICD-10-CM | POA: Insufficient documentation

## 2022-05-17 DIAGNOSIS — Z79899 Other long term (current) drug therapy: Secondary | ICD-10-CM | POA: Insufficient documentation

## 2022-05-17 DIAGNOSIS — H02403 Unspecified ptosis of bilateral eyelids: Secondary | ICD-10-CM

## 2022-05-17 HISTORY — PX: BROW LIFT: SHX178

## 2022-05-17 SURGERY — BLEPHAROPLASTY
Anesthesia: General | Site: Eye | Laterality: Bilateral

## 2022-05-17 MED ORDER — TOBRAMYCIN-DEXAMETHASONE 0.3-0.1 % OP OINT
TOPICAL_OINTMENT | OPHTHALMIC | Status: AC
  Filled 2022-05-17: qty 3.5

## 2022-05-17 MED ORDER — BSS IO SOLN
INTRAOCULAR | Status: DC | PRN
  Administered 2022-05-17: 15 mL via INTRAOCULAR

## 2022-05-17 MED ORDER — CHLORHEXIDINE GLUCONATE 0.12 % MT SOLN
15.0000 mL | Freq: Once | OROMUCOSAL | Status: AC
  Administered 2022-05-17: 15 mL via OROMUCOSAL
  Filled 2022-05-17: qty 15

## 2022-05-17 MED ORDER — BSS IO SOLN
INTRAOCULAR | Status: AC
  Filled 2022-05-17: qty 30

## 2022-05-17 MED ORDER — DEXAMETHASONE SODIUM PHOSPHATE 10 MG/ML IJ SOLN
INTRAMUSCULAR | Status: DC | PRN
  Administered 2022-05-17: 10 mg via INTRAVENOUS

## 2022-05-17 MED ORDER — CHLORHEXIDINE GLUCONATE CLOTH 2 % EX PADS: 6.0000 | MEDICATED_PAD | Freq: Once | CUTANEOUS | Status: DC

## 2022-05-17 MED ORDER — SUGAMMADEX SODIUM 200 MG/2ML IV SOLN
INTRAVENOUS | Status: DC | PRN
  Administered 2022-05-17: 100 mg via INTRAVENOUS

## 2022-05-17 MED ORDER — LACTATED RINGERS IV SOLN: INTRAVENOUS | Status: DC

## 2022-05-17 MED ORDER — PHENYLEPHRINE 80 MCG/ML (10ML) SYRINGE FOR IV PUSH (FOR BLOOD PRESSURE SUPPORT)
PREFILLED_SYRINGE | INTRAVENOUS | Status: DC | PRN
  Administered 2022-05-17 (×4): 80 ug via INTRAVENOUS

## 2022-05-17 MED ORDER — CEFAZOLIN SODIUM-DEXTROSE 2-4 GM/100ML-% IV SOLN
2.0000 g | INTRAVENOUS | Status: AC
  Administered 2022-05-17: 2 g via INTRAVENOUS
  Filled 2022-05-17: qty 100

## 2022-05-17 MED ORDER — ARTIFICIAL TEARS OPHTHALMIC OINT
TOPICAL_OINTMENT | OPHTHALMIC | Status: DC | PRN
  Administered 2022-05-17: 1 via OPHTHALMIC

## 2022-05-17 MED ORDER — LIDOCAINE-EPINEPHRINE 1 %-1:100000 IJ SOLN
INTRAMUSCULAR | Status: AC
  Filled 2022-05-17: qty 1

## 2022-05-17 MED ORDER — MIDAZOLAM HCL 2 MG/2ML IJ SOLN
INTRAMUSCULAR | Status: AC
  Filled 2022-05-17: qty 2

## 2022-05-17 MED ORDER — ORAL CARE MOUTH RINSE: 15.0000 mL | Freq: Once | OROMUCOSAL | Status: AC

## 2022-05-17 MED ORDER — FENTANYL CITRATE (PF) 250 MCG/5ML IJ SOLN
INTRAMUSCULAR | Status: AC
  Filled 2022-05-17: qty 5

## 2022-05-17 MED ORDER — FENTANYL CITRATE (PF) 100 MCG/2ML IJ SOLN: 25.0000 ug | INTRAMUSCULAR | Status: DC | PRN

## 2022-05-17 MED ORDER — ONDANSETRON HCL 4 MG/2ML IJ SOLN
INTRAMUSCULAR | Status: DC | PRN
  Administered 2022-05-17: 4 mg via INTRAVENOUS

## 2022-05-17 MED ORDER — BACITRACIN-POLYMYXIN B 500-10000 UNIT/GM OP OINT
TOPICAL_OINTMENT | OPHTHALMIC | Status: AC
  Filled 2022-05-17: qty 3.5

## 2022-05-17 MED ORDER — PROPOFOL 10 MG/ML IV BOLUS
INTRAVENOUS | Status: DC | PRN
  Administered 2022-05-17: 130 mg via INTRAVENOUS

## 2022-05-17 MED ORDER — 0.9 % SODIUM CHLORIDE (POUR BTL) OPTIME
TOPICAL | Status: DC | PRN
  Administered 2022-05-17: 1000 mL

## 2022-05-17 MED ORDER — LIDOCAINE-EPINEPHRINE 1 %-1:100000 IJ SOLN
INTRAMUSCULAR | Status: DC | PRN
  Administered 2022-05-17: 13 mL

## 2022-05-17 MED ORDER — FENTANYL CITRATE (PF) 250 MCG/5ML IJ SOLN
INTRAMUSCULAR | Status: DC | PRN
  Administered 2022-05-17: 100 ug via INTRAVENOUS

## 2022-05-17 MED ORDER — ROCURONIUM BROMIDE 10 MG/ML (PF) SYRINGE
PREFILLED_SYRINGE | INTRAVENOUS | Status: DC | PRN
  Administered 2022-05-17: 50 mg via INTRAVENOUS

## 2022-05-17 MED ORDER — LIDOCAINE 2% (20 MG/ML) 5 ML SYRINGE
INTRAMUSCULAR | Status: DC | PRN
  Administered 2022-05-17: 100 mg via INTRAVENOUS

## 2022-05-17 MED ORDER — PHENYLEPHRINE HCL-NACL 20-0.9 MG/250ML-% IV SOLN
INTRAVENOUS | Status: DC | PRN
  Administered 2022-05-17: 20 ug/min via INTRAVENOUS

## 2022-05-17 SURGICAL SUPPLY — 65 items
APPLICATOR COTTON TIP 6 STRL (MISCELLANEOUS) ×2 IMPLANT
APPLICATOR COTTON TIP 6IN STRL (MISCELLANEOUS) ×10
BLADE SURG 15 STRL LF DISP TIS (BLADE) ×1 IMPLANT
BLADE SURG 15 STRL SS (BLADE) ×2
BNDG ELASTIC 3X5.8 VLCR STR LF (GAUZE/BANDAGES/DRESSINGS) ×1 IMPLANT
BNDG EYE OVAL (GAUZE/BANDAGES/DRESSINGS) ×2 IMPLANT
BNDG GAUZE ELAST 4 BULKY (GAUZE/BANDAGES/DRESSINGS) ×1 IMPLANT
CANISTER SUCT 3000ML PPV (MISCELLANEOUS) ×2 IMPLANT
CLEANER TIP ELECTROSURG 2X2 (MISCELLANEOUS) ×1 IMPLANT
CORD BIPOLAR FORCEPS 12FT (ELECTRODE) ×1 IMPLANT
COVER SURGICAL LIGHT HANDLE (MISCELLANEOUS) ×2 IMPLANT
COVER TABLE BACK 60X90 (DRAPES) ×1 IMPLANT
DERMABOND ADVANCED (GAUZE/BANDAGES/DRESSINGS)
DERMABOND ADVANCED .7 DNX12 (GAUZE/BANDAGES/DRESSINGS) IMPLANT
DRAPE ORTHO SPLIT 87X125 STRL (DRAPES) ×2 IMPLANT
DRAPE UNIVERSAL PACK (DRAPES) ×1 IMPLANT
ELECT NDL BLADE 2-5/6 (NEEDLE) ×1 IMPLANT
ELECT NDL TIP 2.8 STRL (NEEDLE) ×1 IMPLANT
ELECT NEEDLE BLADE 2-5/6 (NEEDLE) ×2 IMPLANT
ELECT NEEDLE TIP 2.8 STRL (NEEDLE) ×2 IMPLANT
ELECT REM PT RETURN 9FT ADLT (ELECTROSURGICAL) ×2
ELECTRODE REM PT RTRN 9FT ADLT (ELECTROSURGICAL) ×1 IMPLANT
GAUZE SPONGE 4X4 12PLY STRL (GAUZE/BANDAGES/DRESSINGS) ×1 IMPLANT
GAUZE XEROFORM 1X8 LF (GAUZE/BANDAGES/DRESSINGS) ×1 IMPLANT
GLOVE BIOGEL PI IND STRL 8 (GLOVE) ×1 IMPLANT
GLOVE BIOGEL PI INDICATOR 8 (GLOVE) ×1
GLOVE SURG ENC MOIS LTX SZ7.5 (GLOVE) ×2 IMPLANT
GLOVE SURG UNDER POLY LF SZ7.5 (GLOVE) ×2 IMPLANT
GOWN STRL REUS W/ TWL LRG LVL3 (GOWN DISPOSABLE) ×2 IMPLANT
GOWN STRL REUS W/ TWL XL LVL3 (GOWN DISPOSABLE) ×1 IMPLANT
GOWN STRL REUS W/TWL LRG LVL3 (GOWN DISPOSABLE) ×2
GOWN STRL REUS W/TWL XL LVL3 (GOWN DISPOSABLE) ×4
HANDPIECE INTERPULSE COAX TIP (DISPOSABLE)
KIT BASIN OR (CUSTOM PROCEDURE TRAY) ×2 IMPLANT
KIT TURNOVER KIT B (KITS) ×2 IMPLANT
MARKER SKIN DUAL TIP RULER LAB (MISCELLANEOUS) ×2 IMPLANT
NDL HYPO 30X.5 LL (NEEDLE) ×1 IMPLANT
NEEDLE HYPO 30X.5 LL (NEEDLE) ×2 IMPLANT
NS IRRIG 1000ML POUR BTL (IV SOLUTION) ×2 IMPLANT
PAD ARMBOARD 7.5X6 YLW CONV (MISCELLANEOUS) ×4 IMPLANT
PENCIL BUTTON HOLSTER BLD 10FT (ELECTRODE) ×2 IMPLANT
PROTECTOR CORNEAL (OPHTHALMIC RELATED) ×4 IMPLANT
SET HNDPC FAN SPRY TIP SCT (DISPOSABLE) IMPLANT
SPONGE T-LAP 18X18 ~~LOC~~+RFID (SPONGE) ×1 IMPLANT
STRIP CLOSURE SKIN 1/2X4 (GAUZE/BANDAGES/DRESSINGS) ×1 IMPLANT
SUT MERSILENE 4-0 S-2 (SUTURE) ×1 IMPLANT
SUT MNCRL 6-0 UNDY P1 1X18 (SUTURE) IMPLANT
SUT MONOCRYL 6-0 P1 1X18 (SUTURE)
SUT PDS AB 5-0 P3 18 (SUTURE) ×1 IMPLANT
SUT PLAIN 5 0 P 3 18 (SUTURE) ×1 IMPLANT
SUT PROLENE 4 0 P 3 18 (SUTURE) ×1 IMPLANT
SUT PROLENE 5 0 P 3 (SUTURE) ×1 IMPLANT
SUT PROLENE 6 0 P 1 18 (SUTURE) ×3 IMPLANT
SUT SILK 4 0 PS 2 (SUTURE) IMPLANT
SUT SILK 6 0 G 6 (SUTURE) ×4 IMPLANT
SUT SILK 6 0 P 1 (SUTURE) ×1 IMPLANT
SUT VIC AB 4-0 P-3 18X BRD (SUTURE) ×1 IMPLANT
SUT VIC AB 4-0 P3 18 (SUTURE)
SYR CONTROL 10ML LL (SYRINGE) ×1 IMPLANT
TOWEL GREEN STERILE (TOWEL DISPOSABLE) ×2 IMPLANT
TOWEL GREEN STERILE FF (TOWEL DISPOSABLE) ×2 IMPLANT
TRAY ENT MC OR (CUSTOM PROCEDURE TRAY) ×2 IMPLANT
TUBE CONNECTING 12X1/4 (SUCTIONS) ×1 IMPLANT
WATER STERILE IRR 1000ML POUR (IV SOLUTION) ×1 IMPLANT
YANKAUER SUCT BULB TIP NO VENT (SUCTIONS) ×1 IMPLANT

## 2022-05-17 NOTE — Anesthesia Postprocedure Evaluation (Signed)
ggg Anesthesia Post Note  Patient: Lori Conrad  Procedure(s) Performed: BILATERAL UPPER BLEPHAROPLASTY WITH PTOSIS REPAIR (Bilateral: Eye)     Patient location during evaluation: PACU Anesthesia Type: General Level of consciousness: awake Pain management: pain level controlled Vital Signs Assessment: post-procedure vital signs reviewed and stable Respiratory status: spontaneous breathing Cardiovascular status: stable Postop Assessment: no apparent nausea or vomiting Anesthetic complications: no   No notable events documented.  Last Vitals:  Vitals:   05/17/22 1425 05/17/22 1440  BP: 136/77 137/75  Pulse: 78 73  Resp: 14 13  Temp:  36.9 C  SpO2: 93% 95%    Last Pain:  Vitals:   05/17/22 1440  TempSrc:   PainSc: 0-No pain                 Trennon Torbeck

## 2022-05-17 NOTE — Op Note (Addendum)
Operative Note   DATE OF OPERATION: 05/17/2022  SURGICAL DEPARTMENT: Plastic Surgery  PREOPERATIVE DIAGNOSES:  dermatochalasis, eyelid ptosis  POSTOPERATIVE DIAGNOSES:  same  PROCEDURE:   1) bilateral upper blepharoplasty 2) bilateral upper eyelid ptosis repair with levator advancement  SURGEON: Irvin Bastin P. Shaley Leavens, MD  ASSISTANT: Krista Blue, PA  ANESTHESIA:  General.   COMPLICATIONS: None.   INDICATIONS FOR PROCEDURE:  The patient, Lori Conrad is a 76 y.o. female born on 1946-03-29, is here for treatment of bilateral upper eyelid dermatochalasis and eyelid ptosis. MRN: [id number]  CONSENT:  Informed consent was obtained directly from the patient. Risks, benefits and alternatives were fully discussed. Specific risks including but not limited to bleeding, infection, hematoma, seroma, scarring, pain, contracture, asymmetry, wound healing problems, and need for further surgery were all discussed. The patient did have an ample opportunity to have questions answered to satisfaction.   DESCRIPTION OF PROCEDURE:  The patient was taken to the operating room. SCDs were placed and preop antibiotics were given. General anesthesia was administered.  The patient's operative site was prepped and draped in a sterile fashion. A time out was performed and all information was confirmed to be correct.    Throughout the procedure corneal protectors were used for safety.  Eyelids were injected with 1% lidocaine with epinephrine after marking the patient with calipers.  Due her dermatochalasis and aggressive ptosis repair planned a significant amount of eyelid skin was marked for removal to prevent bunching of skin with the lid in its higher position.  15 blade was used to make a skin incision.  A skin muscle septal flap was then developed with Wescott scissors.  The skin that was marked was removed with scissors and passed to the back table to keep until the end of the procedure.  Bovie electrocautery was  used for hemostasis.  Elevator muscle levator aponeurosis was exposed.  I then entered the underside of the levator aponeurosis at the level of the superior tarsus.  The dissection proceeded under the levator aponeurosis and inferior levator muscle.  A 6-0 silk was used to suture the upper edge of the tarsus to the muscular aponeurotic junction of the levator.  Following this a small amount of fat was resected medially and centrally.  Incision was then closed with a combination of running and interrupted 6-0 Prolene.  The nearly identical procedure was then performed on the left side with a slightly more aggressive 6-0 silk suture since the left arm was slightly lower.  Eyes were irrigated BSS and ophthalmic ointment was placed in both eyes.  The patient tolerated the procedure well.  There were no complications. The patient was allowed to wake from anesthesia, extubated and taken to the recovery room in satisfactory condition.

## 2022-05-17 NOTE — Anesthesia Preprocedure Evaluation (Addendum)
Anesthesia Evaluation  Patient identified by MRN, date of birth, ID band Patient awake    Reviewed: Allergy & Precautions, NPO status , Patient's Chart, lab work & pertinent test results  Airway Mallampati: II  TM Distance: >3 FB     Dental   Pulmonary former smoker,    breath sounds clear to auscultation       Cardiovascular + CAD and + DOE   Rhythm:Regular Rate:Normal  History noted. W/U neg   Neuro/Psych    GI/Hepatic negative GI ROS, Neg liver ROS,   Endo/Other  Hypothyroidism   Renal/GU negative Renal ROS     Musculoskeletal   Abdominal   Peds  Hematology   Anesthesia Other Findings   Reproductive/Obstetrics                            Anesthesia Physical Anesthesia Plan  ASA: 3  Anesthesia Plan: General   Post-op Pain Management:    Induction: Intravenous  PONV Risk Score and Plan: Ondansetron and Dexamethasone  Airway Management Planned: LMA and Oral ETT  Additional Equipment:   Intra-op Plan:   Post-operative Plan: Extubation in OR  Informed Consent: I have reviewed the patients History and Physical, chart, labs and discussed the procedure including the risks, benefits and alternatives for the proposed anesthesia with the patient or authorized representative who has indicated his/her understanding and acceptance.     Dental advisory given  Plan Discussed with: CRNA and Anesthesiologist  Anesthesia Plan Comments:         Anesthesia Quick Evaluation

## 2022-05-17 NOTE — Interval H&P Note (Signed)
History and Physical Interval Note:  05/17/2022 11:52 AM  Lori Conrad  has presented today for surgery, with the diagnosis of Dermatochalasis of both upper eyelids.  The various methods of treatment have been discussed with the patient and family. After consideration of risks, benefits and other options for treatment, the patient has consented to  Procedure(s) with comments: BLEPHAROPLASTY WITH PTOSIS REPAIR (Bilateral) - 1.5 hours as a surgical intervention.  The patient's history has been reviewed, patient examined, no change in status, stable for surgery.  I have reviewed the patient's chart and labs.  Questions were answered to the patient's satisfaction.     Lennice Sites

## 2022-05-17 NOTE — Anesthesia Procedure Notes (Signed)
Procedure Name: Intubation Date/Time: 05/17/2022 12:43 PM Performed by: Carolan Clines, CRNA Pre-anesthesia Checklist: Patient identified, Emergency Drugs available, Suction available and Patient being monitored Patient Re-evaluated:Patient Re-evaluated prior to induction Oxygen Delivery Method: Circle System Utilized Preoxygenation: Pre-oxygenation with 100% oxygen Induction Type: IV induction Ventilation: Mask ventilation without difficulty Laryngoscope Size: Mac and 3 Grade View: Grade I Tube type: Oral Tube size: 7.5 mm Number of attempts: 1 Airway Equipment and Method: Stylet Placement Confirmation: ETT inserted through vocal cords under direct vision, positive ETCO2 and breath sounds checked- equal and bilateral Secured at: 22 cm Tube secured with: Tape Dental Injury: Teeth and Oropharynx as per pre-operative assessment

## 2022-05-17 NOTE — Anesthesia Postprocedure Evaluation (Signed)
Anesthesia Post Note  Patient: Lori Conrad  Procedure(s) Performed: BILATERAL UPPER BLEPHAROPLASTY WITH PTOSIS REPAIR (Bilateral: Eye)     Patient location during evaluation: PACU Anesthesia Type: General Level of consciousness: awake and alert, oriented and patient cooperative Pain management: pain level controlled Vital Signs Assessment: post-procedure vital signs reviewed and stable Respiratory status: spontaneous breathing, nonlabored ventilation and respiratory function stable Cardiovascular status: blood pressure returned to baseline and stable Postop Assessment: no apparent nausea or vomiting Anesthetic complications: no   No notable events documented.  Last Vitals:  Vitals:   05/17/22 1425 05/17/22 1440  BP: 136/77 137/75  Pulse: 78 73  Resp: 14 13  Temp:  36.9 C  SpO2: 93% 95%    Last Pain:  Vitals:   05/17/22 1440  TempSrc:   PainSc: 0-No pain                 Pervis Hocking

## 2022-05-17 NOTE — Transfer of Care (Signed)
Immediate Anesthesia Transfer of Care Note  Patient: Lori Conrad  Procedure(s) Performed: BILATERAL UPPER BLEPHAROPLASTY WITH PTOSIS REPAIR (Bilateral: Eye)  Patient Location: PACU  Anesthesia Type:General  Level of Consciousness: drowsy  Airway & Oxygen Therapy: Patient Spontanous Breathing  Post-op Assessment: Report given to RN and Post -op Vital signs reviewed and stable  Post vital signs: Reviewed and stable  Last Vitals:  Vitals Value Taken Time  BP 126/68 05/17/22 1411  Temp    Pulse 83 05/17/22 1414  Resp 21 05/17/22 1413  SpO2 95 % 05/17/22 1414  Vitals shown include unvalidated device data.  Last Pain:  Vitals:   05/17/22 1112  TempSrc:   PainSc: 0-No pain      Patients Stated Pain Goal: 0 (90/21/11 5520)  Complications: No notable events documented.

## 2022-05-17 NOTE — Discharge Instructions (Addendum)
1) Ice to eyelid(s) every 1 hour while awake for first 24 hours, may ice for additional days. 2) Elevate head of bed as much as possible for first 2-3 days to reduce swelling 3) Use Refresh PM ophthalmic ointment (or similar) in eye(s) before bed, may use gel or saline drops during the day. May apply similar ointment to the incisions.    If you have mild bleeding, just hold pressure with gauze.  Moderate swelling and bruising around the eyes is expected and is a normal post-operative finding.  However, call the clinic should you experience any severe swelling, bleeding, double vision, blurred vision, or other concerning changes.

## 2022-05-18 ENCOUNTER — Encounter (HOSPITAL_COMMUNITY): Payer: Self-pay | Admitting: Plastic Surgery

## 2022-05-23 ENCOUNTER — Ambulatory Visit (INDEPENDENT_AMBULATORY_CARE_PROVIDER_SITE_OTHER): Payer: Medicare HMO | Admitting: Plastic Surgery

## 2022-05-23 DIAGNOSIS — H02831 Dermatochalasis of right upper eyelid: Secondary | ICD-10-CM

## 2022-05-23 DIAGNOSIS — H02834 Dermatochalasis of left upper eyelid: Secondary | ICD-10-CM

## 2022-05-24 NOTE — Progress Notes (Signed)
Status post upper eyelid blepharoplasty with ptosis repair.  Patient is very happy with initial result and feels like her eyes are more open.  No complaints.  No visual disturbance.  Physical exam Incision clean dry and intact, MRD is 4 bilaterally.  Assessment and plan Patient is doing overall very well after initial ptosis repair and we will see her back to continue to follow her result.

## 2022-05-27 ENCOUNTER — Encounter: Payer: Self-pay | Admitting: Plastic Surgery

## 2022-05-27 ENCOUNTER — Ambulatory Visit (INDEPENDENT_AMBULATORY_CARE_PROVIDER_SITE_OTHER): Payer: Self-pay | Admitting: Plastic Surgery

## 2022-05-27 ENCOUNTER — Telehealth: Payer: Self-pay | Admitting: Plastic Surgery

## 2022-05-27 DIAGNOSIS — Z719 Counseling, unspecified: Secondary | ICD-10-CM

## 2022-05-27 NOTE — Telephone Encounter (Signed)
Spoke to [place] at [employer] regarding retro authorization for CPT code [id number] for surgery on 05/17/2022. Authorization was approved, but no guarantee of payment. Auth # [id number].

## 2022-05-27 NOTE — Progress Notes (Signed)
HALO Treatment        Zone 1470 Depth (Micron) 1470 Density (%) 2940 Depth (M) 2940 Density (%)  Face 500 35 60 18  Neck 400 '30 30 20  '$ Chest 400 '25 30 18    '$ Topical and/or Block: Lidocaine 23% and tetracaine 7%  Post Care: instructions given  Notes:  was not able to save data in media.

## 2022-06-03 DIAGNOSIS — H43813 Vitreous degeneration, bilateral: Secondary | ICD-10-CM | POA: Diagnosis not present

## 2022-06-03 DIAGNOSIS — H353112 Nonexudative age-related macular degeneration, right eye, intermediate dry stage: Secondary | ICD-10-CM | POA: Diagnosis not present

## 2022-06-03 DIAGNOSIS — H35033 Hypertensive retinopathy, bilateral: Secondary | ICD-10-CM | POA: Diagnosis not present

## 2022-06-03 DIAGNOSIS — H353222 Exudative age-related macular degeneration, left eye, with inactive choroidal neovascularization: Secondary | ICD-10-CM | POA: Diagnosis not present

## 2022-06-13 ENCOUNTER — Encounter: Payer: Medicare HMO | Admitting: Plastic Surgery

## 2022-06-13 ENCOUNTER — Ambulatory Visit (INDEPENDENT_AMBULATORY_CARE_PROVIDER_SITE_OTHER): Payer: Medicare HMO | Admitting: Plastic Surgery

## 2022-06-13 DIAGNOSIS — H02834 Dermatochalasis of left upper eyelid: Secondary | ICD-10-CM

## 2022-06-13 DIAGNOSIS — H02831 Dermatochalasis of right upper eyelid: Secondary | ICD-10-CM

## 2022-06-13 DIAGNOSIS — H353222 Exudative age-related macular degeneration, left eye, with inactive choroidal neovascularization: Secondary | ICD-10-CM | POA: Diagnosis not present

## 2022-06-13 DIAGNOSIS — H353112 Nonexudative age-related macular degeneration, right eye, intermediate dry stage: Secondary | ICD-10-CM | POA: Diagnosis not present

## 2022-06-13 DIAGNOSIS — H35033 Hypertensive retinopathy, bilateral: Secondary | ICD-10-CM | POA: Diagnosis not present

## 2022-06-13 DIAGNOSIS — H43813 Vitreous degeneration, bilateral: Secondary | ICD-10-CM | POA: Diagnosis not present

## 2022-06-13 DIAGNOSIS — H02403 Unspecified ptosis of bilateral eyelids: Secondary | ICD-10-CM

## 2022-06-13 NOTE — Progress Notes (Signed)
Status post bilateral upper eyelid ptosis repair.  The patient feels she is very symmetric and is very happy with her initial result.  She also had a halo laser recently.  She is healed well from that and is happy with the result.  Physical exam MRD equals 4 bilaterally.  No significant dermatochalasis remaining.  Assessment and plan Patient is doing very well after bilateral upper eyelid ptosis repair and halo laser.  She may try an additional halo laser in the future.  We discussed that it could be an option to include her lower eyelids and the halo laser if she used corneal shields.

## 2022-06-28 DIAGNOSIS — G43909 Migraine, unspecified, not intractable, without status migrainosus: Secondary | ICD-10-CM | POA: Diagnosis not present

## 2022-06-28 DIAGNOSIS — H539 Unspecified visual disturbance: Secondary | ICD-10-CM | POA: Diagnosis not present

## 2022-07-05 DIAGNOSIS — H543 Unqualified visual loss, both eyes: Secondary | ICD-10-CM | POA: Diagnosis not present

## 2022-07-05 DIAGNOSIS — H5022 Vertical strabismus, left eye: Secondary | ICD-10-CM | POA: Diagnosis not present

## 2022-07-05 DIAGNOSIS — H3530 Unspecified macular degeneration: Secondary | ICD-10-CM | POA: Diagnosis not present

## 2022-07-05 DIAGNOSIS — I10 Essential (primary) hypertension: Secondary | ICD-10-CM | POA: Diagnosis not present

## 2022-07-05 DIAGNOSIS — E039 Hypothyroidism, unspecified: Secondary | ICD-10-CM | POA: Diagnosis not present

## 2022-07-05 DIAGNOSIS — E785 Hyperlipidemia, unspecified: Secondary | ICD-10-CM | POA: Diagnosis not present

## 2022-07-05 DIAGNOSIS — H532 Diplopia: Secondary | ICD-10-CM | POA: Diagnosis not present

## 2022-07-05 DIAGNOSIS — H5000 Unspecified esotropia: Secondary | ICD-10-CM | POA: Diagnosis not present

## 2022-07-05 DIAGNOSIS — I739 Peripheral vascular disease, unspecified: Secondary | ICD-10-CM | POA: Diagnosis not present

## 2022-07-05 DIAGNOSIS — H353230 Exudative age-related macular degeneration, bilateral, stage unspecified: Secondary | ICD-10-CM | POA: Diagnosis not present

## 2022-08-08 DIAGNOSIS — I872 Venous insufficiency (chronic) (peripheral): Secondary | ICD-10-CM | POA: Diagnosis not present

## 2022-08-22 ENCOUNTER — Ambulatory Visit (INDEPENDENT_AMBULATORY_CARE_PROVIDER_SITE_OTHER): Payer: Self-pay | Admitting: Plastic Surgery

## 2022-08-22 ENCOUNTER — Encounter: Payer: Self-pay | Admitting: Plastic Surgery

## 2022-08-22 DIAGNOSIS — Z719 Counseling, unspecified: Secondary | ICD-10-CM

## 2022-08-22 NOTE — Progress Notes (Signed)
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 I discussed with the patient this proposed procedure of botulinum toxin injections, which is customized depending on the particular needs of the patient. It is performed on facial rhytids as a temporary correction. The alternatives were discussed with the patient. The risks were addressed including bleeding, scarring, infection, damage to deeper structures, asymmetry, and chronic pain, which may occur infrequently after a procedure. The individual's choice to undergo a surgical procedure is based on the comparison of risks to potential benefits. Other risks include unsatisfactory results, brow ptosis, eyelid ptosis, allergic reaction, temporary paralysis, which should go away with time, bruising, blurring disturbances and delayed healing. Botulinum toxin injections do not arrest the aging process or produce permanent tightening of the eyelid.  Operative intervention maybe necessary to maintain the results of a blepharoplasty or botulinum toxin. The patient understands and wishes to proceed. Consent was given.  Procedure: The area was prepped with alcohol and dried with a clean gauze. Using a clean technique, the botulinum toxin was diluted with 3 cc of preservative-free normal saline which was slowly injected with an 18 gauge needle in a tuberculin syringes.  A 32 gauge needles were then used to inject the botulinum toxin. This mixture allow for an aliquot of 10 units per 0.1 cc in each injection site.    Subsequently the mixture was injected in the glabellar and forehead area with preservation of the temporal branch to the lateral eyebrow as well as into each lateral canthal area beginning from the  lateral orbital rim medial to the zygomaticus major in 3 separate areas. A total of 108 Units of botulinum toxin was used. The forehead and glabellar area was injected with care to inject intramuscular only while holding pressure on the supratrochlear vessels in each area during each injection on either side of the medial corrugators. The injection proceeded vertically superiorly to the medial 2/3 of the frontalis muscle and superior 2/3 of the lateral frontalis, again with preservation of the frontal branch.  The midface area was injected at the 3 sub-regions of the mid-face: zygomaticomalar region, anteromedial cheek region, and submalar region for a total of one syringe on each side of the face. The technique used was serial puncture with equal injections in the 3 sub-regions: the zygomaticomalar region, the anteromedial cheek, and the submalar region.  No complications were noted. Light pressure was held for 5 minutes. She was instructed explicitly in post-operative care.  Dysport LOT:  [id number] EXP:  08/11/2022

## 2022-08-23 DIAGNOSIS — I83893 Varicose veins of bilateral lower extremities with other complications: Secondary | ICD-10-CM | POA: Diagnosis not present

## 2022-08-29 ENCOUNTER — Encounter: Payer: Self-pay | Admitting: Surgical

## 2022-08-29 ENCOUNTER — Ambulatory Visit (INDEPENDENT_AMBULATORY_CARE_PROVIDER_SITE_OTHER): Payer: Self-pay | Admitting: Surgical

## 2022-08-29 DIAGNOSIS — Z719 Counseling, unspecified: Secondary | ICD-10-CM

## 2022-08-29 NOTE — Progress Notes (Signed)
Botulinum Toxin Procedure Note  Procedure: Cosmetic botulinum toxin  Pre-operative Diagnosis: Dynamic rhytides  Post-operative Diagnosis: Same  Complications:  None  Brief history: Patient was here 1 week ago for Dysport injection. She reports she has some asymmetry, reports her right side brow is low and she is bothered by this. She would like to know what can be done to fix this.  We discussed intentionally dropping the left brow for symmetry. Patient was agreeable to this.  Procedure: The area was prepped with alcohol and dried with a clean gauze.  Using a clean technique the botulinum toxin was diluted with 3 mL of bacteriostatic saline per 300 unit vial which resulted in 10 units per 0.1 mL.  Subsequently the mixture was injected in the lateral forehead area. A total of 5 Units of botulinum toxin was used. The forehead area was injected with care to inject intramuscular only while holding pressure on the supratrochlear vessels in each area during each injection on either side of the medial corrugators.   No complications were noted.   Dysport LOT:  [id number] EXP:  05/11/2024

## 2022-09-05 DIAGNOSIS — I83893 Varicose veins of bilateral lower extremities with other complications: Secondary | ICD-10-CM | POA: Diagnosis not present

## 2022-09-06 ENCOUNTER — Other Ambulatory Visit: Payer: Self-pay | Admitting: Family Medicine

## 2022-09-06 DIAGNOSIS — Z1231 Encounter for screening mammogram for malignant neoplasm of breast: Secondary | ICD-10-CM

## 2022-09-13 ENCOUNTER — Other Ambulatory Visit (HOSPITAL_COMMUNITY): Payer: Self-pay | Admitting: Family Medicine

## 2022-09-13 DIAGNOSIS — M810 Age-related osteoporosis without current pathological fracture: Secondary | ICD-10-CM | POA: Diagnosis not present

## 2022-09-13 DIAGNOSIS — D509 Iron deficiency anemia, unspecified: Secondary | ICD-10-CM | POA: Diagnosis not present

## 2022-09-13 DIAGNOSIS — E785 Hyperlipidemia, unspecified: Secondary | ICD-10-CM

## 2022-09-13 DIAGNOSIS — H3530 Unspecified macular degeneration: Secondary | ICD-10-CM | POA: Diagnosis not present

## 2022-09-13 DIAGNOSIS — E039 Hypothyroidism, unspecified: Secondary | ICD-10-CM | POA: Diagnosis not present

## 2022-09-13 DIAGNOSIS — Z8249 Family history of ischemic heart disease and other diseases of the circulatory system: Secondary | ICD-10-CM | POA: Diagnosis not present

## 2022-09-13 DIAGNOSIS — Z0000 Encounter for general adult medical examination without abnormal findings: Secondary | ICD-10-CM | POA: Diagnosis not present

## 2022-09-20 DIAGNOSIS — I87392 Chronic venous hypertension (idiopathic) with other complications of left lower extremity: Secondary | ICD-10-CM | POA: Diagnosis not present

## 2022-09-20 DIAGNOSIS — I83892 Varicose veins of left lower extremities with other complications: Secondary | ICD-10-CM | POA: Diagnosis not present

## 2022-09-26 ENCOUNTER — Ambulatory Visit (HOSPITAL_COMMUNITY)
Admission: RE | Admit: 2022-09-26 | Discharge: 2022-09-26 | Disposition: A | Discharge status: Home / Self Care | Payer: Self-pay | Source: Ambulatory Visit | Attending: Family Medicine | Admitting: Family Medicine

## 2022-09-26 DIAGNOSIS — Z8249 Family history of ischemic heart disease and other diseases of the circulatory system: Secondary | ICD-10-CM | POA: Insufficient documentation

## 2022-09-26 DIAGNOSIS — E785 Hyperlipidemia, unspecified: Secondary | ICD-10-CM | POA: Insufficient documentation

## 2022-10-05 ENCOUNTER — Ambulatory Visit
Admission: RE | Admit: 2022-10-05 | Discharge: 2022-10-05 | Disposition: A | Discharge status: Home / Self Care | Payer: Medicare HMO | Source: Ambulatory Visit | Attending: Family Medicine | Admitting: Family Medicine

## 2022-10-05 DIAGNOSIS — Z1231 Encounter for screening mammogram for malignant neoplasm of breast: Secondary | ICD-10-CM

## 2022-10-06 DIAGNOSIS — I83891 Varicose veins of right lower extremities with other complications: Secondary | ICD-10-CM | POA: Diagnosis not present

## 2022-10-06 DIAGNOSIS — M79604 Pain in right leg: Secondary | ICD-10-CM | POA: Diagnosis not present

## 2022-10-07 ENCOUNTER — Ambulatory Visit: Payer: Medicare HMO | Admitting: Plastic Surgery

## 2022-10-07 ENCOUNTER — Encounter: Payer: Self-pay | Admitting: Plastic Surgery

## 2022-10-07 DIAGNOSIS — L989 Disorder of the skin and subcutaneous tissue, unspecified: Secondary | ICD-10-CM

## 2022-10-07 NOTE — Progress Notes (Signed)
   Subjective:    Patient ID: Lori Conrad, female    DOB: 04/12/1946, 76 y.o.   MRN: [id number]  The patient is a 76 year old female joining me for evaluation of her face.  She has noticed a skin lesion on her right cheek.  It has been there for at least a year.  It is getting larger.  It sometimes it is red and irritating.  Right now it is about 3 mm in size.  She has another 1 on the left angle of her mandible.  It is flesh-colored and somewhat irregular with discolored wider base.  Nothing has improved them and they have gotten larger in time.  She does not have history of skin cancer.        Review of Systems  Constitutional: Negative.   Eyes: Negative.   Respiratory: Negative.  Negative for chest tightness and shortness of breath.   Cardiovascular: Negative.  Negative for leg swelling.  Gastrointestinal: Negative.   Endocrine: Negative.   Genitourinary: Negative.        Objective:   Physical Exam HENT:     Head: Normocephalic and atraumatic.   Cardiovascular:     Rate and Rhythm: Normal rate.  Pulmonary:     Effort: Pulmonary effort is normal.  Skin:    Capillary Refill: Capillary refill takes less than 2 seconds.     Coloration: Skin is not jaundiced.     Findings: Lesion present. No bruising.  Neurological:     Mental Status: She is alert and oriented to person, place, and time.  Psychiatric:        Mood and Affect: Mood normal.        Behavior: Behavior normal.        Thought Content: Thought content normal.        Judgment: Judgment normal.        Assessment & Plan:     ICD-10-CM   1. Changing skin lesion  L98.9       Pictures were obtained of the patient and placed in the chart with the patient's or guardian's permission.  For excision of changing skin lesions right cheek and left angle of mandible.  Patient will have a scar but it should fade in time.

## 2022-10-10 DIAGNOSIS — I83812 Varicose veins of left lower extremities with pain: Secondary | ICD-10-CM | POA: Diagnosis not present

## 2022-10-10 DIAGNOSIS — I83892 Varicose veins of left lower extremities with other complications: Secondary | ICD-10-CM | POA: Diagnosis not present

## 2022-10-10 DIAGNOSIS — M7989 Other specified soft tissue disorders: Secondary | ICD-10-CM | POA: Diagnosis not present

## 2022-10-12 ENCOUNTER — Encounter: Payer: Self-pay | Admitting: Internal Medicine

## 2022-10-12 ENCOUNTER — Ambulatory Visit: Payer: Medicare HMO | Admitting: Internal Medicine

## 2022-10-12 VITALS — BP 147/89 | Temp 97.9°F | Ht 62.0 in | Wt 111.4 lb

## 2022-10-12 DIAGNOSIS — R931 Abnormal findings on diagnostic imaging of heart and coronary circulation: Secondary | ICD-10-CM

## 2022-10-12 DIAGNOSIS — I10 Essential (primary) hypertension: Secondary | ICD-10-CM

## 2022-10-12 DIAGNOSIS — E782 Mixed hyperlipidemia: Secondary | ICD-10-CM | POA: Diagnosis not present

## 2022-10-12 NOTE — Progress Notes (Signed)
Primary Physician/Referring:  Aura Dials, MD  Patient ID: Lori Conrad, female    DOB: 06/27/1946, 75 y.o.   MRN: [id number]  Chief Complaint  Patient presents with   Hyperlipidemia   New Patient (Initial Visit)   Family History of Heart Disease    HPI:    Lori Conrad  is a 76 y.o. female with past medical history significant for hypertension, hyperlipidemia and elevated coronary artery calcium score who is here to establish care with cardiology.  Patient states she had a stress test many many years ago and it was normal.  Otherwise she denies any cardiac history in herself.  She denies chest pain, shortness of breath, palpitations, diaphoresis, syncope, orthopnea, edema, claudication, PND.  She does not smoke or drink alcohol.  Patient does not have any limitations with her activities of daily living.  She is just concerned about her elevated coronary artery calcium score and would like further work-up for this.  Even though her blood pressure is elevated in the office today, patient states her blood pressure is always less than 120/70 at home.  Past Medical History:  Diagnosis Date   Hyperlipidemia    Hypothyroidism    Past Surgical History:  Procedure Laterality Date   BROW LIFT Bilateral 05/17/2022   Procedure: BILATERAL UPPER BLEPHAROPLASTY WITH PTOSIS REPAIR;  Surgeon: Lennice Sites, MD;  Location: [facility];  Service: Plastics;  Laterality: Bilateral;  1.5 hours   BUNIONECTOMY Left    EYE SURGERY     SHOULDER ARTHROSCOPY     TONSILLECTOMY     Family History  Problem Relation Age of Onset   Lung cancer Mother    Stroke Mother 68   Alzheimer's disease Father    CAD Father        95% occlusion of L Main & RCA, severe descending aorta, arterial & arteriolonephrosclerosis    Cholecystitis Father    Esophagitis Father     Social History   Tobacco Use   Smoking status: Former    Types: Cigarettes    Quit date: 05/15/1988    Years since quitting: 34.4    Smokeless tobacco: Never  Substance Use Topics   Alcohol use: Yes    Alcohol/week: 14.0 standard drinks of alcohol    Types: 14 drink(s) per week    Comment: white wine   Marital Status: Married  ROS  Review of Systems  Cardiovascular:  Negative for chest pain, claudication, dyspnea on exertion, irregular heartbeat, leg swelling, orthopnea, palpitations and syncope.   Objective  Blood pressure (!) 147/89, temperature 97.9 F (36.6 C), height _0  (1.575 m), weight 111 lb 6.4 oz (50.5 kg). Body mass index is 20.38 kg/m.     10/12/2022    9:45 AM 10/07/2022   10:51 AM 05/17/2022    2:40 PM  Vitals with BMI  Height _1  _2    Weight 111 lbs 6 oz 114 lbs 10 oz   BMI 26.20 35.59   Systolic 741 638 453  Diastolic 89 87 75  Pulse  66 73     Physical Exam Vitals reviewed.  HENT:     Head: Normocephalic and atraumatic.  Neck:     Vascular: No carotid bruit.  Cardiovascular:     Rate and Rhythm: Normal rate and regular rhythm.     Pulses: Normal pulses.     Heart sounds: Normal heart sounds. No murmur heard. Pulmonary:     Effort: Pulmonary effort is normal.  Breath sounds: Normal breath sounds.  Abdominal:     General: Bowel sounds are normal.  Musculoskeletal:     Right lower leg: No edema.     Left lower leg: No edema.  Skin:    General: Skin is warm and dry.  Neurological:     Mental Status: She is alert.     Medications and allergies  No Known Allergies   Medication list after today's encounter   Current Outpatient Medications:    atorvastatin (LIPITOR) 40 MG tablet, Take 1 tablet (40 mg total) by mouth daily at 6 PM. NEED OV., Disp: 15 tablet, Rfl: 0   Calcium Carb-Cholecalciferol (SUPER CALCIUM 600 + D 400 PO), Take 1 tablet by mouth 2 (two) times daily., Disp: , Rfl:    Cholecalciferol (VITAMIN D) 50 MCG (2000 UT) tablet, Take 2,000 Units by mouth daily., Disp: , Rfl:    Coenzyme Q10 (COQ10) 400 MG CAPS, Take 400 mg by mouth daily., Disp: , Rfl:     doxylamine, Sleep, (UNISOM) 25 MG tablet, Take 25 mg by mouth at bedtime as needed for sleep., Disp: , Rfl:    Glucosamine-Chondroit-Vit C-Mn (GLUCOSAMINE 1500 COMPLEX PO), Take 1 tablet by mouth daily., Disp: , Rfl:    levothyroxine (SYNTHROID) 75 MCG tablet, Take 75 mcg by mouth daily before breakfast., Disp: , Rfl:    Magnesium 250 MG TABS, Take 250 mg by mouth daily., Disp: , Rfl:    meloxicam (MOBIC) 7.5 MG tablet, Take 7.5 mg by mouth daily., Disp: , Rfl:    Multiple Vitamins-Minerals (CENTRUM ADULTS PO), Take 1 tablet by mouth daily., Disp: , Rfl:    Multiple Vitamins-Minerals (PRESERVISION AREDS 2+MULTI VIT PO), Take 1 tablet by mouth 2 (two) times daily., Disp: , Rfl:    Omega-3 Fatty Acids (OMEGA 3 500) 500 MG CAPS, Take 500 mg by mouth daily., Disp: , Rfl:   Laboratory examination:   No results found for: "NA", "K", "CO2", "GLUCOSE", "BUN", "CREATININE", "CALCIUM", "EGFR", "GFRNONAA"      No data to display            Latest Ref Rng & Units 05/12/2022    2:26 PM  CBC  WBC 4.0 - 10.5 K/uL 5.4   Hemoglobin 12.0 - 15.0 g/dL 14.3   Hematocrit 36.0 - 46.0 % 44.4   Platelets 150 - 400 K/uL 191     Lipid Panel No results for input(s): "CHOL", "TRIG", "Post", "VLDL", "HDL", "CHOLHDL", "LDLDIRECT" in the last 8760 hours.  HEMOGLOBIN A1C No results found for: "HGBA1C", "MPG" TSH No results for input(s): "TSH" in the last 8760 hours.  External labs:     Radiology:    Cardiac Studies:   09/26/2022 CCTA FINDINGS:  Coronary arteries: Normal origins. Coronary Calcium Score: Left main: 121 Left anterior descending artery: 260 Left circumflex artery: 0 Right coronary artery: 159 Total: 540 Percentile: 86th   Pericardium: Normal. Ascending Aorta: Mildly dilated at 40 mm. Non-cardiac: See separate report from [facility].   IMPRESSION: Coronary calcium score of 540 Agatston units. This was 86th percentile for age-, race-, and sex-matched  controls.     EKG:   10/12/2022 Sinus Rhythm -Left axis and Left atrial enlargement. No evidence of ischemia   Assessment     ICD-10-CM   1. Elevated coronary artery calcium score  R93.1 EKG 12-Lead    2. Essential hypertension  I10     3. Mixed hyperlipidemia  E78.2        Orders Placed This  Encounter  Procedures   EKG 12-Lead    No orders of the defined types were placed in this encounter.   Medications Discontinued During This Encounter  Medication Reason   ondansetron (ZOFRAN) 4 MG tablet    acetaminophen (TYLENOL) 500 MG tablet      Recommendations:   Lori Conrad is a 76 y.o.  F with HTN, HLD, and elevated CACS   Elevated coronary artery calcium score Will obtain stress test and echocardiogram   Essential hypertension Patient states her BP always runs <120/70  Will continue to monitor pressures If still elevated at subsequent visits, will initiate therapy Encourage low-sodium diet, less than 2000 mg daily.   Mixed hyperlipidemia Continue statin   Follow-up in 1-2 months or sooner if needed    Lori Flock, DO, [facility]  10/12/2022, 9:54 AM Office: [phone] Pager: [phone]

## 2022-10-14 ENCOUNTER — Ambulatory Visit: Payer: Medicare HMO | Admitting: Plastic Surgery

## 2022-10-21 DIAGNOSIS — K5790 Diverticulosis of intestine, part unspecified, without perforation or abscess without bleeding: Secondary | ICD-10-CM | POA: Diagnosis not present

## 2022-10-21 DIAGNOSIS — K5909 Other constipation: Secondary | ICD-10-CM | POA: Diagnosis not present

## 2022-10-24 ENCOUNTER — Ambulatory Visit: Payer: Medicare HMO

## 2022-10-24 DIAGNOSIS — I10 Essential (primary) hypertension: Secondary | ICD-10-CM

## 2022-10-24 DIAGNOSIS — E782 Mixed hyperlipidemia: Secondary | ICD-10-CM | POA: Diagnosis not present

## 2022-10-24 DIAGNOSIS — R931 Abnormal findings on diagnostic imaging of heart and coronary circulation: Secondary | ICD-10-CM | POA: Diagnosis not present

## 2022-10-24 DIAGNOSIS — R9431 Abnormal electrocardiogram [ECG] [EKG]: Secondary | ICD-10-CM | POA: Diagnosis not present

## 2022-10-25 ENCOUNTER — Encounter: Payer: Self-pay | Admitting: Plastic Surgery

## 2022-10-25 ENCOUNTER — Ambulatory Visit: Payer: Medicare HMO | Admitting: Plastic Surgery

## 2022-10-25 ENCOUNTER — Other Ambulatory Visit (HOSPITAL_COMMUNITY)
Admission: RE | Admit: 2022-10-25 | Discharge: 2022-10-25 | Disposition: A | Discharge status: Home / Self Care | Payer: Medicare HMO | Source: Ambulatory Visit | Attending: Plastic Surgery | Admitting: Plastic Surgery

## 2022-10-25 VITALS — BP 156/96 | HR 73

## 2022-10-25 DIAGNOSIS — L989 Disorder of the skin and subcutaneous tissue, unspecified: Secondary | ICD-10-CM | POA: Diagnosis not present

## 2022-10-25 DIAGNOSIS — L821 Other seborrheic keratosis: Secondary | ICD-10-CM

## 2022-10-25 NOTE — Progress Notes (Signed)
Procedure Note  Preoperative Dx: changing skin lesion face Right cheek Left cheek  Postoperative Dx: Same  Procedure: excision of changing skin lesion face Right cheek - 3 mm Left cheek - 1 cm  Anesthesia: Lidocaine 1% with 1:100,000 epinephrine  Indication for Procedure: skin lesion  Description of Procedure: Risks and complications were explained to the patient.  Consent was confirmed and the patient understands the risks and benefits.  The potential complications and alternatives were explained and the patient consents.  The patient expressed understanding the option of not having the procedure and the risks of a scar.  Time out was called and all information was confirmed to be correct.    Right cheek:  The area was prepped and drapped.  Lidocaine 1% with epinepherine was injected in the subcutaneous area.  After waiting several minutes for the local to take affect a #15 blade was used to excise the area in an eliptical pattern. The skin edges were reapproximated with 6-0 Monocryl.  A dressing was applied.   Left cheek:  The area was prepped and drapped.  Lidocaine 1% with epinepherine was injected in the subcutaneous area.  After waiting several minutes for the local to take affect a #15 blade was used to excise the area in an eliptical pattern.  The skin edges were reapproximated with 6-0 Monocryl subcuticular running closure.  A dressing was applied.  The patient was given instructions on how to care for the area and a follow up appointment.  Allecia tolerated the procedure well and there were no complications. The specimens were sent to pathology.

## 2022-10-26 DIAGNOSIS — I83891 Varicose veins of right lower extremities with other complications: Secondary | ICD-10-CM | POA: Diagnosis not present

## 2022-10-26 DIAGNOSIS — M79661 Pain in right lower leg: Secondary | ICD-10-CM | POA: Diagnosis not present

## 2022-10-27 LAB — SURGICAL PATHOLOGY

## 2022-11-01 ENCOUNTER — Ambulatory Visit (INDEPENDENT_AMBULATORY_CARE_PROVIDER_SITE_OTHER): Payer: Medicare HMO | Admitting: Student

## 2022-11-01 ENCOUNTER — Telehealth: Payer: Self-pay | Admitting: *Deleted

## 2022-11-01 ENCOUNTER — Encounter: Payer: Self-pay | Admitting: Student

## 2022-11-01 VITALS — BP 149/81 | HR 74

## 2022-11-01 DIAGNOSIS — L821 Other seborrheic keratosis: Secondary | ICD-10-CM

## 2022-11-01 DIAGNOSIS — L989 Disorder of the skin and subcutaneous tissue, unspecified: Secondary | ICD-10-CM

## 2022-11-01 NOTE — Telephone Encounter (Signed)
Spoke with pt to notify biopsy results were benign. She verbalized understanding

## 2022-11-01 NOTE — Progress Notes (Addendum)
Patient is a 76 year old female with history of changing skin lesions.  Patient underwent excision of changing skin lesions to the right cheek and the left cheek with Dr. Marla Roe on 10/25/2022.  Per the procedure note, the lesion to the right cheek was excised, and the skin edges were reapproximated with 6-0 Monocryl.  The lesion to the left cheek was excised and the skin edges were reapproximated with 6-0 Monocryl subcutaneous running closure.  Pathology shows that both of these lesions were consistent with seborrheic keratosis.  Patient presents to the clinic today for postprocedural follow-up.  Today, patient reports she is doing well.  She denies any issues with the surgical sites.  She denies any redness, swelling or drainage.  She denies any fevers or chills.  I discussed the results of the pathology with the patient.  On exam, patient is sitting upright in no acute distress.  Incision sites are intact with Steri-Strips.  Steri-Strips were removed.  Incision to the right cheek is intact with Monocryl suture.  There is a little bit of irritation noted.  There is no drainage.  It does not appear infected.  The incision is intact to the left cheek with Monocryl.  There is no surrounding erythema, drainage or swelling.  Monocryl sutures were removed from both sutures.  Patient tolerated well.  I discussed with the patient that she should apply Vaseline daily to her incisions for the next 1 to 2 weeks.  I discussed with her that in 1 to 2 weeks, she can start using scar cream such as Melvin, [place] or Mederma.  Patient states that she wants to use Silagen.  I discussed with the patient to avoid any direct sunlight to the incisions as this can worsen the scar and I discussed with the patient that she should wear a hat and sunscreen when she does go out to the sun.  Patient expressed understanding.  I discussed with the patient that she can follow-up with [place] as needed.  I instructed her to call us  if she has any questions or concerns.  Patient's vital signs for today's encounter are as follows: BP (!) 149/81 (BP Location: Left Arm, Patient Position: Sitting, Cuff Size: Normal)   Pulse 74   SpO2 98%   Patient is encouraged to follow-up with their primary care provider for evaluation and management of their mildly elevated blood pressure.  Advised them to obtain an at-home automatic blood pressure cuff and check their blood pressure at the same time each day. Patient reports she has been a little more stressed than usual today.

## 2022-11-09 NOTE — Telephone Encounter (Signed)
From patient.

## 2022-11-11 ENCOUNTER — Ambulatory Visit: Payer: Medicare HMO | Admitting: Surgical

## 2022-11-25 ENCOUNTER — Ambulatory Visit: Payer: Medicare HMO

## 2022-12-07 ENCOUNTER — Encounter: Payer: Self-pay | Admitting: Internal Medicine

## 2022-12-07 ENCOUNTER — Ambulatory Visit: Payer: Medicare HMO | Admitting: Internal Medicine

## 2022-12-07 VITALS — BP 139/90 | HR 76 | Ht 62.0 in | Wt 113.6 lb

## 2022-12-07 DIAGNOSIS — I7121 Aneurysm of the ascending aorta, without rupture: Secondary | ICD-10-CM | POA: Diagnosis not present

## 2022-12-07 DIAGNOSIS — E782 Mixed hyperlipidemia: Secondary | ICD-10-CM

## 2022-12-07 DIAGNOSIS — I10 Essential (primary) hypertension: Secondary | ICD-10-CM

## 2022-12-07 NOTE — Progress Notes (Signed)
Primary Physician/Referring:  Aura Dials, MD  Patient ID: Lori Conrad, female    DOB: Feb 10, 1946, 76 y.o.   MRN: [id number]  Chief Complaint  Patient presents with   Elevated coronary artery calcium score   Follow-up   Results   HPI:    Lori Conrad  is a 76 y.o. female with past medical history significant for hypertension, hyperlipidemia and elevated coronary artery calcium score who is here for a follow-up visit. She has been doing well since the last time she was here. No new concerns or complaints. Patient denies chest pain, shortness of breath, palpitations, diaphoresis, syncope, edema, PND, orthopnea.   Past Medical History:  Diagnosis Date   Hyperlipidemia    Hypothyroidism    Past Surgical History:  Procedure Laterality Date   BROW LIFT Bilateral 05/17/2022   Procedure: BILATERAL UPPER BLEPHAROPLASTY WITH PTOSIS REPAIR;  Surgeon: Lennice Sites, MD;  Location: [facility];  Service: Plastics;  Laterality: Bilateral;  1.5 hours   BUNIONECTOMY Left    EYE SURGERY     SHOULDER ARTHROSCOPY     TONSILLECTOMY     Family History  Problem Relation Age of Onset   Lung cancer Mother    Stroke Mother 65   Alzheimer's disease Father    CAD Father        95% occlusion of L Main & RCA, severe descending aorta, arterial & arteriolonephrosclerosis    Cholecystitis Father    Esophagitis Father     Social History   Tobacco Use   Smoking status: Former    Types: Cigarettes    Quit date: 05/15/1988    Years since quitting: 34.6   Smokeless tobacco: Never  Substance Use Topics   Alcohol use: Yes    Alcohol/week: 14.0 standard drinks of alcohol    Types: 14 drink(s) per week    Comment: white wine   Marital Status: Married  ROS  Review of Systems  Cardiovascular:  Negative for chest pain, claudication, dyspnea on exertion, irregular heartbeat, leg swelling, orthopnea, palpitations and syncope.   Objective  Blood pressure (!) 139/90, pulse 76, height _0   (1.575 m), weight 113 lb 9.6 oz (51.5 kg), SpO2 98 %. Body mass index is 20.78 kg/m.     12/07/2022    9:21 AM 11/01/2022    2:47 PM 10/25/2022    9:15 AM  Vitals with BMI  Height _1     Weight 113 lbs 10 oz    BMI 60.10    Systolic 932 355 732  Diastolic 90 81 96  Pulse 76 74 73     Physical Exam Vitals reviewed.  HENT:     Head: Normocephalic and atraumatic.  Neck:     Vascular: No carotid bruit.  Cardiovascular:     Rate and Rhythm: Normal rate and regular rhythm.     Pulses: Normal pulses.     Heart sounds: Normal heart sounds. No murmur heard. Pulmonary:     Effort: Pulmonary effort is normal.     Breath sounds: Normal breath sounds.  Abdominal:     General: Bowel sounds are normal.  Musculoskeletal:     Right lower leg: No edema.     Left lower leg: No edema.  Skin:    General: Skin is warm and dry.  Neurological:     Mental Status: She is alert.     Medications and allergies  No Known Allergies   Medication list after today's encounter   Current Outpatient Medications:  atorvastatin (LIPITOR) 40 MG tablet, Take 1 tablet (40 mg total) by mouth daily at 6 PM. NEED OV., Disp: 15 tablet, Rfl: 0   Calcium Carb-Cholecalciferol (SUPER CALCIUM 600 + D 400 PO), Take 1 tablet by mouth 2 (two) times daily., Disp: , Rfl:    Cholecalciferol (VITAMIN D) 50 MCG (2000 UT) tablet, Take 2,000 Units by mouth daily., Disp: , Rfl:    Coenzyme Q10 (COQ10) 400 MG CAPS, Take 400 mg by mouth daily., Disp: , Rfl:    COMIRNATY syringe, , Disp: , Rfl:    doxylamine, Sleep, (UNISOM) 25 MG tablet, Take 25 mg by mouth at bedtime as needed for sleep., Disp: , Rfl:    Glucosamine-Chondroit-Vit C-Mn (GLUCOSAMINE 1500 COMPLEX PO), Take 1 tablet by mouth daily., Disp: , Rfl:    levothyroxine (SYNTHROID) 75 MCG tablet, Take 75 mcg by mouth daily before breakfast., Disp: , Rfl:    Magnesium 250 MG TABS, Take 250 mg by mouth daily., Disp: , Rfl:    meloxicam (MOBIC) 7.5 MG tablet, Take 7.5  mg by mouth daily., Disp: , Rfl:    Multiple Vitamins-Minerals (CENTRUM ADULTS PO), Take 1 tablet by mouth daily., Disp: , Rfl:    Multiple Vitamins-Minerals (PRESERVISION AREDS 2+MULTI VIT PO), Take 1 tablet by mouth 2 (two) times daily., Disp: , Rfl:    Omega-3 Fatty Acids (OMEGA 3 500) 500 MG CAPS, Take 500 mg by mouth daily., Disp: , Rfl:   Laboratory examination:   No results found for: "NA", "K", "CO2", "GLUCOSE", "BUN", "CREATININE", "CALCIUM", "EGFR", "GFRNONAA"      No data to display            Latest Ref Rng & Units 05/12/2022    2:26 PM  CBC  WBC 4.0 - 10.5 K/uL 5.4   Hemoglobin 12.0 - 15.0 g/dL 14.3   Hematocrit 36.0 - 46.0 % 44.4   Platelets 150 - 400 K/uL 191     Lipid Panel No results for input(s): "CHOL", "TRIG", "[place]", "VLDL", "HDL", "CHOLHDL", "LDLDIRECT" in the last 8760 hours.  HEMOGLOBIN A1C No results found for: "HGBA1C", "MPG" TSH No results for input(s): "TSH" in the last 8760 hours.  External labs:     Radiology:    Cardiac Studies:   09/26/2022 CCTA FINDINGS:  Coronary arteries: Normal origins. Coronary Calcium Score: Left main: 121 Left anterior descending artery: 260 Left circumflex artery: 0 Right coronary artery: 159 Total: 540 Percentile: 86th   Pericardium: Normal. Ascending Aorta: Mildly dilated at 40 mm. Non-cardiac: See separate report from [facility].   IMPRESSION: Coronary calcium score of 540 Agatston units. This was 86th percentile for age-, race-, and sex-matched controls.   Echocardiogram 10/24/2022: Normal LV systolic function with visual EF 60-65%. Left ventricle cavity is normal in size. Hypertrophic cardiomyopathy. Mild concentric hypertrophy of the left ventricle. Normal global wall motion. Doppler evidence of grade I (impaired) diastolic dysfunction, normal LAP. Calculated EF 69%. Trileaflet aortic valve with no regurgitation. Mild aortic valve leaflet thickening with moderate  calcification. No mitral valve regurgitation. Mild mitral valve leaflet thickening. Structurally normal tricuspid valve.  Mild tricuspid regurgitation. The aortic root is mildly dilated at 4.1 cm. Mildly dilated ascending aorta at 3.9 cm.    EKG:   10/12/2022 Sinus Rhythm -Left axis and Left atrial enlargement. No evidence of ischemia   Assessment     ICD-10-CM   1. Essential hypertension  I10     2. Aneurysm of ascending aorta without rupture (HCC)  I71.21 PCV ECHOCARDIOGRAM  COMPLETE    3. Mixed hyperlipidemia  E78.2        Orders Placed This Encounter  Procedures   PCV ECHOCARDIOGRAM COMPLETE    Standing Status:   Future    Standing Expiration Date:   12/08/2023    No orders of the defined types were placed in this encounter.   There are no discontinued medications.    Recommendations:   Cella Cappello is a 76 y.o.  F with HTN, HLD, and elevated CACS     Essential hypertension Patient states her BP always runs <120/70  Will continue to monitor pressures Encourage low-sodium diet, less than 2000 mg daily.   Mixed hyperlipidemia Continue statin   Follow-up in 12 months or sooner if needed    Floydene Flock, DO, [facility]  12/12/2022, 2:39 PM Office: [phone] Pager: [phone]

## 2022-12-13 DIAGNOSIS — H353112 Nonexudative age-related macular degeneration, right eye, intermediate dry stage: Secondary | ICD-10-CM | POA: Diagnosis not present

## 2022-12-13 DIAGNOSIS — H5319 Other subjective visual disturbances: Secondary | ICD-10-CM | POA: Diagnosis not present

## 2022-12-13 DIAGNOSIS — H35033 Hypertensive retinopathy, bilateral: Secondary | ICD-10-CM | POA: Diagnosis not present

## 2022-12-13 DIAGNOSIS — H43813 Vitreous degeneration, bilateral: Secondary | ICD-10-CM | POA: Diagnosis not present

## 2022-12-13 DIAGNOSIS — H353221 Exudative age-related macular degeneration, left eye, with active choroidal neovascularization: Secondary | ICD-10-CM | POA: Diagnosis not present

## 2022-12-19 ENCOUNTER — Ambulatory Visit: Payer: Medicare HMO | Admitting: Plastic Surgery

## 2023-01-03 ENCOUNTER — Ambulatory Visit: Payer: Medicare HMO | Admitting: Surgical

## 2023-01-03 DIAGNOSIS — H02403 Unspecified ptosis of bilateral eyelids: Secondary | ICD-10-CM

## 2023-01-03 DIAGNOSIS — H02834 Dermatochalasis of left upper eyelid: Secondary | ICD-10-CM | POA: Diagnosis not present

## 2023-01-03 DIAGNOSIS — H02831 Dermatochalasis of right upper eyelid: Secondary | ICD-10-CM

## 2023-01-03 DIAGNOSIS — H532 Diplopia: Secondary | ICD-10-CM | POA: Diagnosis not present

## 2023-01-03 DIAGNOSIS — H5000 Unspecified esotropia: Secondary | ICD-10-CM | POA: Diagnosis not present

## 2023-01-03 DIAGNOSIS — H5022 Vertical strabismus, left eye: Secondary | ICD-10-CM | POA: Diagnosis not present

## 2023-01-03 NOTE — Progress Notes (Signed)
   Referring Provider Lori Dials, MD [address],  [place] [id number]   CC:  Chief Complaint  Patient presents with   Follow-up      Lori Conrad is an 77 y.o. female.  HPI: 77 year old female here for follow-up after bilateral upper eyelid blepharoplasty with ptosis repair with Dr. Erin Hearing on 05/17/2022.  She reports she is doing really well, reports no issues with her vision.  She is very happy.  She has also had Botox in the past, reports that she had very heavy brows and would not like the injections to be as low next time.  Review of Systems General: No vision changes  Physical Exam    12/07/2022    9:21 AM 11/01/2022    2:47 PM 10/25/2022    9:15 AM  Vitals with BMI  Height '5\' 2"'$     Weight 113 lbs 10 oz    BMI 03.54    Systolic 656 812 751  Diastolic 90 81 96  Pulse 76 74 73    General:  No acute distress,  Alert and oriented, Non-Toxic, Normal speech and affect Bilateral upper eyelid incisions are intact and very well-healed.   Assessment/Plan 77 year old female here for follow-up after bilateral upper eyelid blepharoplasty with ptosis repair with Dr. Erin Hearing approximately 7 months ago.  She is doing very well, she has a great result and has no issues at this point.  She is very happy.  We will plan to see her back in the future for any additional Botox or laser procedures.  [name] [name] 01/03/2023, 9:43 AM

## 2023-01-26 DIAGNOSIS — I781 Nevus, non-neoplastic: Secondary | ICD-10-CM | POA: Diagnosis not present

## 2023-01-26 DIAGNOSIS — I83893 Varicose veins of bilateral lower extremities with other complications: Secondary | ICD-10-CM | POA: Diagnosis not present

## 2023-01-26 DIAGNOSIS — I872 Venous insufficiency (chronic) (peripheral): Secondary | ICD-10-CM | POA: Diagnosis not present

## 2023-04-20 ENCOUNTER — Ambulatory Visit (INDEPENDENT_AMBULATORY_CARE_PROVIDER_SITE_OTHER): Payer: Self-pay | Admitting: Surgical

## 2023-04-20 DIAGNOSIS — Z719 Counseling, unspecified: Secondary | ICD-10-CM

## 2023-04-20 NOTE — Progress Notes (Signed)
Botulinum Toxin Procedure Note  Procedure: Cosmetic botulinum toxin  Pre-operative Diagnosis: Dynamic rhytides  Post-operative Diagnosis: Same  Complications:  None  Brief history: The patient desires botulinum toxin injection.  She is aware of the risks including bleeding, damage to deeper structures, asymmetry, brow ptosis, eyelid ptosis, bruising. The patient understands and wishes to proceed.  Procedure: The area was prepped with alcohol and dried with a clean gauze.  Using a clean technique the botulinum toxin was diluted with 2.5 mL of bacteriostatic saline per 100 unit vial which resulted in 4 units per 0.1 mL.  Subsequently the mixture was injected in the glabellar, lateral canthal lines, forehead area with preservation of the temporal branch to the lateral eyebrow. A total of 28 Units of botulinum toxin was used. The forehead and glabellar area was injected with care to inject intramuscular only while holding pressure on the supratrochlear vessels in each area during each injection on either side of the medial corrugators. The injection proceeded vertically superiorly to the medial 2/3 of the frontalis muscle and superior 2/3 of the lateral frontalis, again with preservation of the frontal branch.  No complications were noted. Light pressure was held for 5 minutes. She was instructed explicitly in post-operative care.  Botox LOT:  [id number] EXP:  03/2025

## 2023-05-04 ENCOUNTER — Other Ambulatory Visit: Payer: Self-pay

## 2023-08-29 DIAGNOSIS — H353112 Nonexudative age-related macular degeneration, right eye, intermediate dry stage: Secondary | ICD-10-CM | POA: Diagnosis not present

## 2023-08-29 DIAGNOSIS — H35033 Hypertensive retinopathy, bilateral: Secondary | ICD-10-CM | POA: Diagnosis not present

## 2023-08-29 DIAGNOSIS — H353221 Exudative age-related macular degeneration, left eye, with active choroidal neovascularization: Secondary | ICD-10-CM | POA: Diagnosis not present

## 2023-08-29 DIAGNOSIS — H43813 Vitreous degeneration, bilateral: Secondary | ICD-10-CM | POA: Diagnosis not present

## 2023-08-29 DIAGNOSIS — H5319 Other subjective visual disturbances: Secondary | ICD-10-CM | POA: Diagnosis not present

## 2023-09-04 ENCOUNTER — Other Ambulatory Visit: Payer: Self-pay | Admitting: Family Medicine

## 2023-09-04 DIAGNOSIS — Z1231 Encounter for screening mammogram for malignant neoplasm of breast: Secondary | ICD-10-CM

## 2023-09-11 ENCOUNTER — Ambulatory Visit (INDEPENDENT_AMBULATORY_CARE_PROVIDER_SITE_OTHER): Payer: Self-pay | Admitting: Surgical

## 2023-09-11 DIAGNOSIS — Z719 Counseling, unspecified: Secondary | ICD-10-CM

## 2023-09-11 NOTE — Progress Notes (Signed)
Botulinum Toxin Procedure Note  Procedure: Cosmetic botulinum toxin  Pre-operative Diagnosis: Dynamic rhytides  Post-operative Diagnosis: Same  Complications:  None  Brief history: The patient desires botulinum toxin injection.  She is aware of the risks including bleeding, damage to deeper structures, asymmetry, brow ptosis, eyelid ptosis, bruising. The patient understands and wishes to proceed.  Procedure: The area was prepped with alcohol and dried with a clean gauze.  Using a clean technique the botulinum toxin was diluted with 2.5 mL of bacteriostatic saline per 100 unit vial which resulted in 4 units per 0.1 mL.  Subsequently the mixture was injected in the glabellar, lateral canthal lines, forehead area with preservation of the temporal branch to the lateral eyebrow. A total of 26 Units of botulinum toxin was used. The forehead and glabellar area was injected with care to inject intramuscular only while holding pressure on the supratrochlear vessels in each area during each injection on either side of the medial corrugators. The injection proceeded vertically superiorly to the medial 2/3 of the frontalis muscle and superior 2/3 of the lateral frontalis, again with preservation of the frontal branch.  No complications were noted. Light pressure was held for 5 minutes. She was instructed explicitly in post-operative care.  Botox LOT:  [id number] EXP:  08/2025

## 2023-10-02 DIAGNOSIS — H353112 Nonexudative age-related macular degeneration, right eye, intermediate dry stage: Secondary | ICD-10-CM | POA: Diagnosis not present

## 2023-10-02 DIAGNOSIS — H35033 Hypertensive retinopathy, bilateral: Secondary | ICD-10-CM | POA: Diagnosis not present

## 2023-10-02 DIAGNOSIS — H43813 Vitreous degeneration, bilateral: Secondary | ICD-10-CM | POA: Diagnosis not present

## 2023-10-02 DIAGNOSIS — H5319 Other subjective visual disturbances: Secondary | ICD-10-CM | POA: Diagnosis not present

## 2023-10-02 DIAGNOSIS — H353221 Exudative age-related macular degeneration, left eye, with active choroidal neovascularization: Secondary | ICD-10-CM | POA: Diagnosis not present

## 2023-10-06 DIAGNOSIS — I700 Atherosclerosis of aorta: Secondary | ICD-10-CM | POA: Diagnosis not present

## 2023-10-06 DIAGNOSIS — E039 Hypothyroidism, unspecified: Secondary | ICD-10-CM | POA: Diagnosis not present

## 2023-10-06 DIAGNOSIS — M8580 Other specified disorders of bone density and structure, unspecified site: Secondary | ICD-10-CM | POA: Diagnosis not present

## 2023-10-06 DIAGNOSIS — R1900 Intra-abdominal and pelvic swelling, mass and lump, unspecified site: Secondary | ICD-10-CM | POA: Diagnosis not present

## 2023-10-06 DIAGNOSIS — D509 Iron deficiency anemia, unspecified: Secondary | ICD-10-CM | POA: Diagnosis not present

## 2023-10-06 DIAGNOSIS — E785 Hyperlipidemia, unspecified: Secondary | ICD-10-CM | POA: Diagnosis not present

## 2023-10-06 DIAGNOSIS — Z0000 Encounter for general adult medical examination without abnormal findings: Secondary | ICD-10-CM | POA: Diagnosis not present

## 2023-10-06 DIAGNOSIS — Z23 Encounter for immunization: Secondary | ICD-10-CM | POA: Diagnosis not present

## 2023-10-06 DIAGNOSIS — H3530 Unspecified macular degeneration: Secondary | ICD-10-CM | POA: Diagnosis not present

## 2023-10-09 ENCOUNTER — Other Ambulatory Visit: Payer: Self-pay | Admitting: Family Medicine

## 2023-10-09 DIAGNOSIS — H43813 Vitreous degeneration, bilateral: Secondary | ICD-10-CM | POA: Diagnosis not present

## 2023-10-09 DIAGNOSIS — E039 Hypothyroidism, unspecified: Secondary | ICD-10-CM

## 2023-10-09 DIAGNOSIS — M8580 Other specified disorders of bone density and structure, unspecified site: Secondary | ICD-10-CM

## 2023-10-09 DIAGNOSIS — H35033 Hypertensive retinopathy, bilateral: Secondary | ICD-10-CM | POA: Diagnosis not present

## 2023-10-09 DIAGNOSIS — R1900 Intra-abdominal and pelvic swelling, mass and lump, unspecified site: Secondary | ICD-10-CM

## 2023-10-09 DIAGNOSIS — H353112 Nonexudative age-related macular degeneration, right eye, intermediate dry stage: Secondary | ICD-10-CM | POA: Diagnosis not present

## 2023-10-09 DIAGNOSIS — H353221 Exudative age-related macular degeneration, left eye, with active choroidal neovascularization: Secondary | ICD-10-CM | POA: Diagnosis not present

## 2023-10-09 DIAGNOSIS — H5319 Other subjective visual disturbances: Secondary | ICD-10-CM | POA: Diagnosis not present

## 2023-10-10 ENCOUNTER — Ambulatory Visit
Admission: RE | Admit: 2023-10-10 | Discharge: 2023-10-10 | Disposition: A | Discharge status: Home / Self Care | Payer: Medicare HMO | Source: Ambulatory Visit | Attending: Family Medicine | Admitting: Family Medicine

## 2023-10-10 ENCOUNTER — Inpatient Hospital Stay
Admission: RE | Admit: 2023-10-10 | Discharge: 2023-10-10 | Discharge status: Home / Self Care | Payer: Medicare HMO | Source: Ambulatory Visit | Attending: Family Medicine | Admitting: Family Medicine

## 2023-10-10 ENCOUNTER — Other Ambulatory Visit: Payer: Medicare HMO

## 2023-10-10 DIAGNOSIS — R1900 Intra-abdominal and pelvic swelling, mass and lump, unspecified site: Secondary | ICD-10-CM

## 2023-10-10 DIAGNOSIS — E042 Nontoxic multinodular goiter: Secondary | ICD-10-CM | POA: Diagnosis not present

## 2023-10-10 DIAGNOSIS — E039 Hypothyroidism, unspecified: Secondary | ICD-10-CM

## 2023-10-11 ENCOUNTER — Ambulatory Visit
Admission: RE | Admit: 2023-10-11 | Discharge: 2023-10-11 | Disposition: A | Discharge status: Home / Self Care | Payer: Medicare HMO | Source: Ambulatory Visit | Attending: Family Medicine | Admitting: Family Medicine

## 2023-10-11 DIAGNOSIS — Z1231 Encounter for screening mammogram for malignant neoplasm of breast: Secondary | ICD-10-CM

## 2023-10-26 ENCOUNTER — Other Ambulatory Visit: Payer: Self-pay | Admitting: Family Medicine

## 2023-10-26 ENCOUNTER — Ambulatory Visit
Admission: RE | Admit: 2023-10-26 | Discharge: 2023-10-26 | Disposition: A | Discharge status: Home / Self Care | Payer: Medicare HMO | Source: Ambulatory Visit | Attending: Family Medicine | Admitting: Family Medicine

## 2023-10-26 DIAGNOSIS — K5730 Diverticulosis of large intestine without perforation or abscess without bleeding: Secondary | ICD-10-CM | POA: Diagnosis not present

## 2023-10-26 DIAGNOSIS — E2839 Other primary ovarian failure: Secondary | ICD-10-CM | POA: Diagnosis not present

## 2023-10-26 DIAGNOSIS — M8580 Other specified disorders of bone density and structure, unspecified site: Secondary | ICD-10-CM

## 2023-10-26 DIAGNOSIS — R1909 Other intra-abdominal and pelvic swelling, mass and lump: Secondary | ICD-10-CM | POA: Diagnosis not present

## 2023-10-26 DIAGNOSIS — I700 Atherosclerosis of aorta: Secondary | ICD-10-CM | POA: Diagnosis not present

## 2023-10-26 DIAGNOSIS — M8588 Other specified disorders of bone density and structure, other site: Secondary | ICD-10-CM | POA: Diagnosis not present

## 2023-10-26 DIAGNOSIS — R935 Abnormal findings on diagnostic imaging of other abdominal regions, including retroperitoneum: Secondary | ICD-10-CM

## 2023-10-26 DIAGNOSIS — N958 Other specified menopausal and perimenopausal disorders: Secondary | ICD-10-CM | POA: Diagnosis not present

## 2023-10-26 MED ORDER — IOPAMIDOL (ISOVUE-300) INJECTION 61%
100.0000 mL | Freq: Once | INTRAVENOUS | Status: AC | PRN
  Administered 2023-10-26: 100 mL via INTRAVENOUS

## 2023-10-27 ENCOUNTER — Telehealth: Payer: Self-pay | Admitting: *Deleted

## 2023-10-27 ENCOUNTER — Encounter: Payer: Self-pay | Admitting: Psychiatry

## 2023-10-27 NOTE — Telephone Encounter (Signed)
Spoke with Ms. Lori Conrad regarding her referral to GYN oncology. She has an appointment scheduled with Dr. Alvester Morin on Monday 11/18 at 1245. Patient agrees to date and time. She has been provided with office address and location. She is also aware of our mask and visitor policy. Patient verbalized understanding and will call with any questions.

## 2023-10-30 ENCOUNTER — Inpatient Hospital Stay (HOSPITAL_COMMUNITY)
Admission: EM | Admit: 2023-10-30 | Discharge: 2023-11-03 | DRG: 358 | Disposition: A | Discharge status: Home / Self Care | Payer: Medicare HMO | Attending: Internal Medicine | Admitting: Internal Medicine

## 2023-10-30 ENCOUNTER — Encounter (HOSPITAL_COMMUNITY): Payer: Self-pay

## 2023-10-30 ENCOUNTER — Emergency Department (HOSPITAL_COMMUNITY): Payer: Medicare HMO

## 2023-10-30 ENCOUNTER — Observation Stay (HOSPITAL_COMMUNITY): Payer: Medicare HMO

## 2023-10-30 ENCOUNTER — Inpatient Hospital Stay: Payer: Medicare HMO | Admitting: Psychiatry

## 2023-10-30 ENCOUNTER — Other Ambulatory Visit: Payer: Self-pay

## 2023-10-30 DIAGNOSIS — R1900 Intra-abdominal and pelvic swelling, mass and lump, unspecified site: Principal | ICD-10-CM | POA: Diagnosis present

## 2023-10-30 DIAGNOSIS — R935 Abnormal findings on diagnostic imaging of other abdominal regions, including retroperitoneum: Secondary | ICD-10-CM | POA: Diagnosis not present

## 2023-10-30 DIAGNOSIS — E039 Hypothyroidism, unspecified: Secondary | ICD-10-CM | POA: Diagnosis present

## 2023-10-30 DIAGNOSIS — D1803 Hemangioma of intra-abdominal structures: Secondary | ICD-10-CM | POA: Diagnosis not present

## 2023-10-30 DIAGNOSIS — Z801 Family history of malignant neoplasm of trachea, bronchus and lung: Secondary | ICD-10-CM | POA: Diagnosis not present

## 2023-10-30 DIAGNOSIS — R1084 Generalized abdominal pain: Principal | ICD-10-CM

## 2023-10-30 DIAGNOSIS — Z79890 Hormone replacement therapy (postmenopausal): Secondary | ICD-10-CM | POA: Diagnosis not present

## 2023-10-30 DIAGNOSIS — I714 Abdominal aortic aneurysm, without rupture, unspecified: Secondary | ICD-10-CM | POA: Diagnosis present

## 2023-10-30 DIAGNOSIS — Z452 Encounter for adjustment and management of vascular access device: Secondary | ICD-10-CM | POA: Diagnosis not present

## 2023-10-30 DIAGNOSIS — D259 Leiomyoma of uterus, unspecified: Secondary | ICD-10-CM | POA: Diagnosis not present

## 2023-10-30 DIAGNOSIS — N3289 Other specified disorders of bladder: Secondary | ICD-10-CM | POA: Diagnosis not present

## 2023-10-30 DIAGNOSIS — J9811 Atelectasis: Secondary | ICD-10-CM | POA: Diagnosis not present

## 2023-10-30 DIAGNOSIS — N858 Other specified noninflammatory disorders of uterus: Secondary | ICD-10-CM | POA: Diagnosis not present

## 2023-10-30 DIAGNOSIS — K7689 Other specified diseases of liver: Secondary | ICD-10-CM | POA: Diagnosis not present

## 2023-10-30 DIAGNOSIS — K769 Liver disease, unspecified: Secondary | ICD-10-CM | POA: Diagnosis not present

## 2023-10-30 DIAGNOSIS — N329 Bladder disorder, unspecified: Secondary | ICD-10-CM | POA: Diagnosis not present

## 2023-10-30 DIAGNOSIS — R1903 Right lower quadrant abdominal swelling, mass and lump: Secondary | ICD-10-CM | POA: Diagnosis not present

## 2023-10-30 DIAGNOSIS — Z791 Long term (current) use of non-steroidal anti-inflammatories (NSAID): Secondary | ICD-10-CM | POA: Diagnosis not present

## 2023-10-30 DIAGNOSIS — I10 Essential (primary) hypertension: Secondary | ICD-10-CM | POA: Diagnosis not present

## 2023-10-30 DIAGNOSIS — R109 Unspecified abdominal pain: Secondary | ICD-10-CM | POA: Diagnosis present

## 2023-10-30 DIAGNOSIS — Z823 Family history of stroke: Secondary | ICD-10-CM | POA: Diagnosis not present

## 2023-10-30 DIAGNOSIS — Z800 Family history of malignant neoplasm of digestive organs: Secondary | ICD-10-CM | POA: Diagnosis not present

## 2023-10-30 DIAGNOSIS — E785 Hyperlipidemia, unspecified: Secondary | ICD-10-CM | POA: Diagnosis present

## 2023-10-30 DIAGNOSIS — Z8249 Family history of ischemic heart disease and other diseases of the circulatory system: Secondary | ICD-10-CM

## 2023-10-30 DIAGNOSIS — D72829 Elevated white blood cell count, unspecified: Secondary | ICD-10-CM | POA: Diagnosis present

## 2023-10-30 DIAGNOSIS — Z820 Family history of epilepsy and other diseases of the nervous system: Secondary | ICD-10-CM

## 2023-10-30 DIAGNOSIS — R1031 Right lower quadrant pain: Secondary | ICD-10-CM

## 2023-10-30 DIAGNOSIS — M47816 Spondylosis without myelopathy or radiculopathy, lumbar region: Secondary | ICD-10-CM | POA: Diagnosis not present

## 2023-10-30 DIAGNOSIS — Z79899 Other long term (current) drug therapy: Secondary | ICD-10-CM | POA: Diagnosis not present

## 2023-10-30 DIAGNOSIS — I7121 Aneurysm of the ascending aorta, without rupture: Secondary | ICD-10-CM | POA: Diagnosis present

## 2023-10-30 DIAGNOSIS — K644 Residual hemorrhoidal skin tags: Secondary | ICD-10-CM | POA: Diagnosis present

## 2023-10-30 DIAGNOSIS — Z87891 Personal history of nicotine dependence: Secondary | ICD-10-CM

## 2023-10-30 DIAGNOSIS — R933 Abnormal findings on diagnostic imaging of other parts of digestive tract: Secondary | ICD-10-CM | POA: Diagnosis not present

## 2023-10-30 DIAGNOSIS — K5730 Diverticulosis of large intestine without perforation or abscess without bleeding: Secondary | ICD-10-CM | POA: Diagnosis present

## 2023-10-30 DIAGNOSIS — R188 Other ascites: Secondary | ICD-10-CM | POA: Diagnosis not present

## 2023-10-30 DIAGNOSIS — R590 Localized enlarged lymph nodes: Secondary | ICD-10-CM | POA: Diagnosis not present

## 2023-10-30 HISTORY — DX: Unspecified macular degeneration: H35.30

## 2023-10-30 LAB — COMPREHENSIVE METABOLIC PANEL
ALT: 16 U/L (ref 0–44)
AST: 29 U/L (ref 15–41)
Albumin: 3.8 g/dL (ref 3.5–5.0)
Alkaline Phosphatase: 66 U/L (ref 38–126)
Anion gap: 10 (ref 5–15)
BUN: 18 mg/dL (ref 8–23)
CO2: 25 mmol/L (ref 22–32)
Calcium: 9.6 mg/dL (ref 8.9–10.3)
Chloride: 102 mmol/L (ref 98–111)
Creatinine, Ser: 0.66 mg/dL (ref 0.44–1.00)
GFR, Estimated: >60 mL/min (ref >60)
Glucose, Bld: 145 mg/dL — ABNORMAL HIGH (ref 70–99)
Potassium: 3.8 mmol/L (ref 3.5–5.1)
Sodium: 137 mmol/L (ref 135–145)
Total Bilirubin: 0.9 mg/dL (ref <1.2)
Total Protein: 6.3 g/dL — ABNORMAL LOW (ref 6.5–8.1)

## 2023-10-30 LAB — CBC
HCT: 47 % — ABNORMAL HIGH (ref 36.0–46.0)
Hemoglobin: 14.9 g/dL (ref 12.0–15.0)
MCH: 29 pg (ref 26.0–34.0)
MCHC: 31.7 g/dL (ref 30.0–36.0)
MCV: 91.4 fL (ref 80.0–100.0)
Platelets: 184 10*3/uL (ref 150–400)
RBC: 5.14 MIL/uL — ABNORMAL HIGH (ref 3.87–5.11)
RDW: 13.5 % (ref 11.5–15.5)
WBC: 11.3 10*3/uL — ABNORMAL HIGH (ref 4.0–10.5)
nRBC: 0 % (ref 0.0–0.2)

## 2023-10-30 LAB — URINALYSIS, ROUTINE W REFLEX MICROSCOPIC
Bacteria, UA: NONE SEEN
Bilirubin Urine: NEGATIVE
Glucose, UA: NEGATIVE mg/dL
Hgb urine dipstick: NEGATIVE
Ketones, ur: 5 mg/dL — AB
Nitrite: NEGATIVE
Protein, ur: NEGATIVE mg/dL
Specific Gravity, Urine: 1.012 (ref 1.005–1.030)
pH: 7 (ref 5.0–8.0)

## 2023-10-30 LAB — LIPASE, BLOOD: Lipase: 56 U/L — ABNORMAL HIGH (ref 11–51)

## 2023-10-30 MED ORDER — ALBUTEROL SULFATE (2.5 MG/3ML) 0.083% IN NEBU: 2.5000 mg | INHALATION_SOLUTION | Freq: Four times a day (QID) | RESPIRATORY_TRACT | Status: DC | PRN

## 2023-10-30 MED ORDER — IOHEXOL 350 MG/ML SOLN
75.0000 mL | Freq: Once | INTRAVENOUS | Status: AC | PRN
  Administered 2023-10-30: 75 mL via INTRAVENOUS

## 2023-10-30 MED ORDER — HYDROMORPHONE HCL 1 MG/ML IJ SOLN
0.5000 mg | Freq: Once | INTRAMUSCULAR | Status: AC
  Administered 2023-10-30: 0.5 mg via INTRAVENOUS
  Filled 2023-10-30: qty 1

## 2023-10-30 MED ORDER — THIAMINE HCL 100 MG/ML IJ SOLN
100.0000 mg | Freq: Every day | INTRAMUSCULAR | Status: DC
  Filled 2023-10-30 (×2): qty 2

## 2023-10-30 MED ORDER — THIAMINE MONONITRATE 100 MG PO TABS
100.0000 mg | ORAL_TABLET | Freq: Every day | ORAL | Status: DC
  Administered 2023-10-30 – 2023-11-03 (×5): 100 mg via ORAL
  Filled 2023-10-30 (×5): qty 1

## 2023-10-30 MED ORDER — SODIUM CHLORIDE 0.9% FLUSH
3.0000 mL | Freq: Two times a day (BID) | INTRAVENOUS | Status: DC
  Administered 2023-10-30 – 2023-11-03 (×9): 3 mL via INTRAVENOUS

## 2023-10-30 MED ORDER — SODIUM CHLORIDE 0.9 % IV BOLUS
1000.0000 mL | Freq: Once | INTRAVENOUS | Status: AC
Start: 2023-10-30 — End: 2023-10-30
  Administered 2023-10-30: 1000 mL via INTRAVENOUS

## 2023-10-30 MED ORDER — LORAZEPAM 1 MG PO TABS: 1.0000 mg | ORAL_TABLET | ORAL | Status: AC | PRN

## 2023-10-30 MED ORDER — MORPHINE SULFATE (PF) 4 MG/ML IV SOLN
4.0000 mg | Freq: Once | INTRAVENOUS | Status: AC
  Administered 2023-10-30: 4 mg via INTRAVENOUS
  Filled 2023-10-30: qty 1

## 2023-10-30 MED ORDER — LORAZEPAM 2 MG/ML IJ SOLN: 1.0000 mg | INTRAMUSCULAR | Status: AC | PRN

## 2023-10-30 MED ORDER — MORPHINE SULFATE (PF) 2 MG/ML IV SOLN
2.0000 mg | INTRAVENOUS | Status: DC | PRN
  Administered 2023-10-30 – 2023-11-02 (×7): 2 mg via INTRAVENOUS
  Filled 2023-10-30 (×8): qty 1

## 2023-10-30 MED ORDER — ATORVASTATIN CALCIUM 40 MG PO TABS
40.0000 mg | ORAL_TABLET | Freq: Every day | ORAL | Status: DC
  Administered 2023-10-30 – 2023-11-02 (×4): 40 mg via ORAL
  Filled 2023-10-30 (×4): qty 1

## 2023-10-30 MED ORDER — ONDANSETRON HCL 4 MG/2ML IJ SOLN
4.0000 mg | Freq: Once | INTRAMUSCULAR | Status: AC
  Administered 2023-10-30: 4 mg via INTRAVENOUS
  Filled 2023-10-30: qty 2

## 2023-10-30 MED ORDER — DOXYLAMINE SUCCINATE (SLEEP) 25 MG PO TABS
25.0000 mg | ORAL_TABLET | Freq: Every evening | ORAL | Status: DC | PRN
  Administered 2023-11-02: 25 mg via ORAL
  Filled 2023-10-30 (×2): qty 1

## 2023-10-30 MED ORDER — HYDROCODONE-ACETAMINOPHEN 5-325 MG PO TABS
1.0000 | ORAL_TABLET | Freq: Four times a day (QID) | ORAL | Status: DC | PRN
  Administered 2023-10-30 – 2023-10-31 (×2): 1 via ORAL
  Filled 2023-10-30 (×2): qty 1

## 2023-10-30 MED ORDER — GADOBUTROL 1 MMOL/ML IV SOLN
5.0000 mL | Freq: Once | INTRAVENOUS | Status: AC | PRN
  Administered 2023-10-30: 5 mL via INTRAVENOUS

## 2023-10-30 MED ORDER — ACETAMINOPHEN 325 MG PO TABS: 650.0000 mg | ORAL_TABLET | Freq: Four times a day (QID) | ORAL | Status: DC | PRN

## 2023-10-30 MED ORDER — LEVOTHYROXINE SODIUM 75 MCG PO TABS
75.0000 ug | ORAL_TABLET | Freq: Every day | ORAL | Status: DC
  Administered 2023-10-31 – 2023-11-03 (×3): 75 ug via ORAL
  Filled 2023-10-30 (×3): qty 1

## 2023-10-30 MED ORDER — HYDRALAZINE HCL 20 MG/ML IJ SOLN: 10.0000 mg | INTRAMUSCULAR | Status: DC | PRN

## 2023-10-30 MED ORDER — ADULT MULTIVITAMIN W/MINERALS CH
1.0000 | ORAL_TABLET | Freq: Every day | ORAL | Status: DC
  Administered 2023-10-30 – 2023-11-03 (×5): 1 via ORAL
  Filled 2023-10-30 (×5): qty 1

## 2023-10-30 MED ORDER — ENOXAPARIN SODIUM 40 MG/0.4ML IJ SOSY
40.0000 mg | PREFILLED_SYRINGE | INTRAMUSCULAR | Status: DC
  Administered 2023-10-30 – 2023-11-02 (×4): 40 mg via SUBCUTANEOUS
  Filled 2023-10-30 (×4): qty 0.4

## 2023-10-30 MED ORDER — ACETAMINOPHEN 650 MG RE SUPP: 650.0000 mg | Freq: Four times a day (QID) | RECTAL | Status: DC | PRN

## 2023-10-30 MED ORDER — FOLIC ACID 1 MG PO TABS
1.0000 mg | ORAL_TABLET | Freq: Every day | ORAL | Status: DC
Start: 2023-10-30 — End: 2023-11-03
  Administered 2023-10-30 – 2023-11-03 (×5): 1 mg via ORAL
  Filled 2023-10-30 (×5): qty 1

## 2023-10-30 NOTE — Consult Note (Addendum)
Gynecologic Oncology Consultation  Lori Conrad 77 y.o. female  CC:  Chief Complaint  Patient presents with   Abdominal Pain    HPI: Lori Conrad is a 77 year old female who presented to the [facility] ER on 10/30/2023 with right sided abdominal pain. She reported the pain as severe, sharp, starting early this am. She reported nausea with no emesis. Reports last BM was large output after the PO contrast from CT on 10/26/23. Up until presentation, she was eating/drinking at home without issue. Labs in the ER included lipase at 56, WBC 11.3, Hgb 14.9, Hct 47, UA with trace leukocytes. CT AP imaging was performed on 10/30/2023 and compared with recent CT on 10/26/23 with results below:   IMPRESSION: 1. Multiple uterine fibroids. As noted on prior study there is an area of the anterior cervix that appears to obliterate the fat plane between the cervix and the posterior wall of the bladder. This is concerning for a cervical mass. Gynecologic consultation recommended. 2. Small soft tissue nodules in the cul-de-sac some of which may relate to diverticuli, but others raise concern for peritoneal nodularity. Attention on follow-up recommended. PET-CT may prove helpful to further evaluate 3. 16 mm short axis portal caval lymph node, metastatic disease not excluded. 4. Left colonic diverticulosis without diverticulitis.  Of note, she did have a new patient outpatient appointment with Dr. Clide Cliff with GYN Oncology today but she sought care in the ER given her worsening symptoms. Her past medical history includes hyperlipidemia and hypothyroidism. She saw her primary care provider for her annual examination and was noted to have a palpable abdominal mass. She underwent a pelvic ultrasound revealing an apparent solid mass extending into the lumen of the bladder on the right. To further evaluate, she underwent a CT AP on 10/26/2023 returning with: 1. An 11 x 9 cm heterogeneous solid and cystic mass arises  from the uterine fundus and is noted to invade the urinary bladder dome wall as well as the distal sigmoid colon. No associated bowel obstruction; however, constipation proximal to irregular bowel wall thickening/mass. No associated stercoral colitis. Finding consistent with malignancy. Recommend gynecologic consultation. When the patient is clinically stable and able to follow directions and hold their breath (preferably as an outpatient) further evaluation with dedicated MRI with and without contrast should be considered. 2. Indeterminate 0.9 cm left hepatic lobe hypodense lesion with a density of 77 HU. Question metastasis versus primary hepatic lesion. 3. Stool throughout the colon 4. Colonic diverticulosis with no acute diverticulitis. 5. 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 6.  Aortic Atherosclerosis (ICD10-I70.0)-severe.       Current Meds: Current inpatient and outpatient medications reviewed  Allergy: No Known Allergies  Social Hx:   Social History   Socioeconomic History   Marital status: Married    Spouse name: Not on file   Number of children: Not on file   Years of education: Not on file   Highest education level: Not on file  Occupational History   Not on file  Tobacco Use   Smoking status: Former    Current packs/day: 0.00    Types: Cigarettes    Quit date: 05/15/1988    Years since quitting: 35.4   Smokeless tobacco: Never  Vaping Use   Vaping status: Never Used  Substance and Sexual Activity   Alcohol use: Yes    Alcohol/week: 14.0 standard drinks of alcohol    Types: 14 drink(s) per week    Comment: white wine  Drug use: No   Sexual activity: Not on file  Other Topics Concern   Not on file  Social History Narrative   Not on file   Social Determinants of Health   Financial Resource Strain: Not on file  Food Insecurity: No Food Insecurity (10/30/2023)   Hunger Vital Sign    Worried About Running Out of Food in the Last Year: Never true     Ran Out of Food in the Last Year: Never true  Transportation Needs: No Transportation Needs (10/30/2023)   PRAPARE - Administrator, Civil Service (Medical): No    Lack of Transportation (Non-Medical): No  Physical Activity: Not on file  Stress: Not on file  Social Connections: Not on file  Intimate Partner Violence: Not At Risk (10/30/2023)   Humiliation, Afraid, Rape, and Kick questionnaire    Fear of Current or Ex-Partner: No    Emotionally Abused: No    Physically Abused: No    Sexually Abused: No    Past Surgical Hx:  Past Surgical History:  Procedure Laterality Date   BROW LIFT Bilateral 05/17/2022   Procedure: BILATERAL UPPER BLEPHAROPLASTY WITH PTOSIS REPAIR;  Surgeon: Janne Napoleon, MD;  Location: [facility] OR;  Service: Plastics;  Laterality: Bilateral;  1.5 hours   BUNIONECTOMY Left    EYE SURGERY     SHOULDER ARTHROSCOPY     TONSILLECTOMY      Past Medical Hx:  Past Medical History:  Diagnosis Date   Hyperlipidemia    Hypothyroidism     Family Hx:  Family History  Problem Relation Age of Onset   Lung cancer Mother    Stroke Mother 82   Alzheimer's disease Father    CAD Father        95% occlusion of L Main & RCA, severe descending aorta, arterial & arteriolonephrosclerosis    Cholecystitis Father    Esophagitis Father    BRCA 1/2 Neg Hx    Breast cancer Neg Hx    Colon cancer Neg Hx    Ovarian cancer Neg Hx    Endometrial cancer Neg Hx    Pancreatic cancer Neg Hx    Prostate cancer Neg Hx    OB History  Gravida Para Term Preterm AB Living  3 2 2   1 2   SAB IAB Ectopic Multiple Live Births  1       2    # Outcome Date GA Lbr Len/2nd Weight Sex Type Anes PTL Lv  3 SAB           2 Term      Vag-Spont   LIV  1 Term      Vag-Spont   LIV   Menopause: age 68 Pap smears: denies abnormal  Vitals:  Blood pressure 121/69, pulse 78, temperature 98.9 F (37.2 C), temperature source Oral, resp. rate 18, height 5\' 1"  (1.549 m), weight 109 lb (49.4  kg), SpO2 (!) 87%.  Physical Exam: (Dr. Alvester Morin) General: Alert, oriented, no acute distress. HEENT: Normocephalic, atraumatic. Neck symmetric without masses.  Chest: Normal work of breathing. . Cardiovascular: Regular rate and rhythm, no murmurs. Remainder of exam deferred today. Will perform tomorrow at time of GYN exam.    Assessment/Plan: 77 year old female currently admitted with a large uterine mass with evidence of invasion into the bladder and distal sigmoid colon concerning for malignancy. She was scheduled for new patient outpatient consultation today with Dr. Alvester Morin (GYN ONC) but went to the ER for further evaluation  due to pain. Dr. Alvester Morin to see patient as inpatient consult this evening. Will plan for pelvic examination with potential biopsies in the patient's room on 10/31/23 tomorrow afternoon. Pt can continue on current lovenox dosing and schedule (22:00).    Doylene Bode, NP 10/30/2023, 4:48 PM   It was medically necessary for me to see the patient in addition to the APP. I personally performed the substantive portion of this visit which included exam, review of labs, and formation of the assessment and plan.   To further evaluate the pelvic mass and eval for metastatic disease, recommend CT chest and MRI abdomen (for liver lesion) and MRI pelvis (for uterine mass). These are ordered. MRI pelvis to further characterize local invasion.  Will plan for pelvic exam tomorrow and possible endometrial biopsy. Reviewed this with pt. If unsuccessful or negative, will then consider VIR biopsy.  Reviewed with pt imaging findings and concern for malignancy, in particular possible uterine sarcoma. Reviewed that imaging today suggested an enlarged caval node (near renal vessels on my review of images). This gives concern for metastatic process. Additionally there is a nonspecific lesion in the liver and lungs have not been evaluated. Reviewed in general terms the treatment for cancer can  include chemo, surgery, radiation, in some combination. Tissue diagnosis will guide treatment plan.  In the meantime, defer pain management to primary team. Return tomorrow for attempt at biopsy. Does not need to be NPO for endometrial biopsy.    Clide Cliff, MD

## 2023-10-30 NOTE — ED Notes (Signed)
ED TO INPATIENT HANDOFF REPORT  ED Nurse Name and Phone #: Ilene Qua [phone]  S Name/Age/Gender Lori Conrad 77 y.o. female Room/Bed: 031C/031C  Code Status   Code Status: Not on file  Home/SNF/Other Home Patient oriented to: self, place, time, and situation Is this baseline? Yes   Triage Complete: Triage complete  Chief Complaint Abdominal pain [R10.9]  Triage Note Pt c/o right sided abdominal pain started at 0530 today. Pt denies N/V/D. Pt is supposed to see a gynecologist oncologist today, because she has a mass in her lower abdomen 11cmx9cm   Allergies No Known Allergies  Level of Care/Admitting Diagnosis ED Disposition     ED Disposition  Admit   Condition  --   Comment  Hospital Area: [facility] [[id number]]  Level of Care: Med-Surg [16]  May place patient in observation at [facility] or [facility] if equivalent level of care is available:: No  Covid Evaluation: Asymptomatic - no recent exposure (last 10 days) testing not required  Diagnosis: Abdominal pain [846962]  Admitting Physician: Clydie Braun [[id number]]  Attending Physician: Clydie Braun [[id number]]          B Medical/Surgery History Past Medical History:  Diagnosis Date   Hyperlipidemia    Hypothyroidism    Past Surgical History:  Procedure Laterality Date   BROW LIFT Bilateral 05/17/2022   Procedure: BILATERAL UPPER BLEPHAROPLASTY WITH PTOSIS REPAIR;  Surgeon: Janne Napoleon, MD;  Location: [facility] OR;  Service: Plastics;  Laterality: Bilateral;  1.5 hours   BUNIONECTOMY Left    EYE SURGERY     SHOULDER ARTHROSCOPY     TONSILLECTOMY       A IV Location/Drains/Wounds Patient Lines/Drains/Airways Status     Active Line/Drains/Airways     Name Placement date Placement time Site Days   Peripheral IV 10/30/23 20 G Anterior;Left;Proximal Antecubital 10/30/23  0856  Antecubital  less than 1            Intake/Output Last 24 hours No intake or output  data in the 24 hours ending 10/30/23 1411  Labs/Imaging Results for orders placed or performed during the hospital encounter of 10/30/23 (from the past 48 hour(s))  Lipase, blood     Status: Abnormal   Collection Time: 10/30/23  7:39 AM  Result Value Ref Range   Lipase 56 (H) 11 - 51 U/L    Comment: Performed at [facility], [address] [place], [place] [address]  Comprehensive metabolic panel     Status: Abnormal   Collection Time: 10/30/23  7:39 AM  Result Value Ref Range   Sodium 137 135 - 145 mmol/L   Potassium 3.8 3.5 - 5.1 mmol/L   Chloride 102 98 - 111 mmol/L   CO2 25 22 - 32 mmol/L   Glucose, Bld 145 (H) 70 - 99 mg/dL    Comment: Glucose reference range applies only to samples taken after fasting for at least 8 hours.   BUN 18 8 - 23 mg/dL   Creatinine, Ser 6.44 0.44 - 1.00 mg/dL   Calcium 9.6 8.9 - 03.4 mg/dL   Total Protein 6.3 (L) 6.5 - 8.1 g/dL   Albumin 3.8 3.5 - 5.0 g/dL   AST 29 15 - 41 U/L   ALT 16 0 - 44 U/L   Alkaline Phosphatase 66 38 - 126 U/L   Total Bilirubin 0.9 <1.2 mg/dL   GFR, Estimated >74 >25 mL/min    Comment: (NOTE) Calculated using the CKD-EPI Creatinine  Equation (2021)    Anion gap 10 5 - 15    Comment: Performed at [facility], [address], [place], [place] [address]  CBC     Status: Abnormal   Collection Time: 10/30/23  7:39 AM  Result Value Ref Range   WBC 11.3 (H) 4.0 - 10.5 K/uL   RBC 5.14 (H) 3.87 - 5.11 MIL/uL   Hemoglobin 14.9 12.0 - 15.0 g/dL   HCT 60.4 (H) 54.0 - 98.1 %   MCV 91.4 80.0 - 100.0 fL   MCH 29.0 26.0 - 34.0 pg   MCHC 31.7 30.0 - 36.0 g/dL   RDW 19.1 47.8 - 29.5 %   Platelets 184 150 - 400 K/uL   nRBC 0.0 0.0 - 0.2 %    Comment: Performed at [facility], [address] [place], [place] [address]  Urinalysis, Routine w reflex microscopic -Urine, Clean Catch     Status: Abnormal   Collection Time: 10/30/23  7:39 AM  Result Value Ref Range   Color, Urine YELLOW YELLOW   APPearance HAZY  (A) CLEAR   Specific Gravity, Urine 1.012 1.005 - 1.030   pH 7.0 5.0 - 8.0   Glucose, UA NEGATIVE NEGATIVE mg/dL   Hgb urine dipstick NEGATIVE NEGATIVE   Bilirubin Urine NEGATIVE NEGATIVE   Ketones, ur 5 (A) NEGATIVE mg/dL   Protein, ur NEGATIVE NEGATIVE mg/dL   Nitrite NEGATIVE NEGATIVE   Leukocytes,Ua TRACE (A) NEGATIVE   RBC / HPF 0-5 0 - 5 RBC/hpf   WBC, UA 0-5 0 - 5 WBC/hpf   Bacteria, UA NONE SEEN NONE SEEN   Squamous Epithelial / HPF 0-5 0 - 5 /HPF   Mucus PRESENT     Comment: Performed at [facility], [address], [place], [place] [address]   CT ABDOMEN PELVIS W CONTRAST  Result Date: 10/30/2023 CLINICAL DATA:  Abdominal pain. EXAM: CT ABDOMEN AND PELVIS WITH CONTRAST TECHNIQUE: Multidetector CT imaging of the abdomen and pelvis was performed using the standard protocol following bolus administration of intravenous contrast. RADIATION DOSE REDUCTION: This exam was performed according to the departmental dose-optimization program which includes automated exposure control, adjustment of the mA and/or kV according to patient size and/or use of iterative reconstruction technique. CONTRAST:  75mL OMNIPAQUE IOHEXOL 350 MG/ML SOLN COMPARISON:  Limb 1424 FINDINGS: Lower chest: No acute findings. Hepatobiliary: Multiple hepatic cysts evident. Scattered tiny hypodensities in the liver parenchyma are too small to characterize but are statistically most likely benign. No followup imaging is recommended. Tiny subcapsular lesion measured previously at 9 mm in the anterior left liver is stable on image 23/3 today, nonspecific. There is no evidence for gallstones, gallbladder wall thickening, or pericholecystic fluid. No intrahepatic or extrahepatic biliary dilation. Pancreas: Dilatation of the pancreatic duct to the head and body of pancreas is similar to prior. Spleen: No splenomegaly. No suspicious focal mass lesion. Adrenals/Urinary Tract: No adrenal nodule or mass. Kidneys unremarkable. No  evidence for hydroureter. Bladder is distended. Stomach/Bowel: Stomach is unremarkable. No gastric wall thickening. No evidence of outlet obstruction. Duodenum is normally positioned as is the ligament of Treitz. No small bowel wall thickening. No small bowel dilatation. Diverticular changes are noted in the left colon without evidence of diverticulitis. Vascular/Lymphatic: 16 mm short axis portal caval lymph node seen on 22/3. No para-aortic lymphadenopathy. No pelvic sidewall lymphadenopathy. Reproductive: Multiple uterine fibroids evident. As noted on prior study there is an area of the anterior cervix that appears to  obliterate the fat plane between the cervix and the posterior wall of the bladder (see sagittal 86/7). Small soft tissue nodules are again noted in the cul-de-sac some of which may pertain to diverticuli, but others raise concern for peritoneal nodularity (see images 61 and 56 of series 3). Other: No substantial free fluid. Musculoskeletal: No worrisome lytic or sclerotic osseous abnormality. IMPRESSION: 1. Multiple uterine fibroids. As noted on prior study there is an area of the anterior cervix that appears to obliterate the fat plane between the cervix and the posterior wall of the bladder. This is concerning for a cervical mass. Gynecologic consultation recommended. 2. Small soft tissue nodules in the cul-de-sac some of which may relate to diverticuli, but others raise concern for peritoneal nodularity. Attention on follow-up recommended. PET-CT may prove helpful to further evaluate 3. 16 mm short axis portal caval lymph node, metastatic disease not excluded. 4. Left colonic diverticulosis without diverticulitis. Electronically Signed   By: Kennith Center M.D.   On: 10/30/2023 13:08    Pending Labs Unresulted Labs (From admission, onward)    None       Vitals/Pain Today's Vitals   10/30/23 1230 10/30/23 1315 10/30/23 1354 10/30/23 1400  BP: 118/69 133/78  (!) 141/74  Pulse: 79 75   72  Resp: 15 15    Temp:      TempSrc:      SpO2: 96% 98%  98%  Weight:      Height:      PainSc:   7      Isolation Precautions No active isolations  Medications Medications  morphine (PF) 4 MG/ML injection 4 mg (4 mg Intravenous Given 10/30/23 0857)  ondansetron (ZOFRAN) injection 4 mg (4 mg Intravenous Given 10/30/23 0857)  sodium chloride 0.9 % bolus 1,000 mL (1,000 mLs Intravenous New Bag/Given 10/30/23 0900)  HYDROmorphone (DILAUDID) injection 0.5 mg (0.5 mg Intravenous Given 10/30/23 0950)  iohexol (OMNIPAQUE) 350 MG/ML injection 75 mL (75 mLs Intravenous Contrast Given 10/30/23 1101)  HYDROmorphone (DILAUDID) injection 0.5 mg (0.5 mg Intravenous Given 10/30/23 1118)  HYDROmorphone (DILAUDID) injection 0.5 mg (0.5 mg Intravenous Given 10/30/23 1409)    Mobility walks     Focused Assessments Renal Assessment Handoff:  Hemodialysis Schedule:  Last Hemodialysis date and time:    Restricted appendage:    R Recommendations: See Admitting Provider Note  Report given to:   Additional Notes:

## 2023-10-30 NOTE — ED Triage Notes (Signed)
Pt c/o right sided abdominal pain started at 0530 today. Pt denies N/V/D. Pt is supposed to see a gynecologist oncologist today, because she has a mass in her lower abdomen 11cmx9cm

## 2023-10-30 NOTE — H&P (Signed)
History and Physical    Patient: Lori Conrad:[id number] DOB: 1946-04-27 DOA: 10/30/2023 DOS: the patient was seen and examined on 10/30/2023 PCP: Henrine Screws, MD  Patient coming from: Home  Chief Complaint:  Chief Complaint  Patient presents with   Abdominal Pain   HPI: Lori Conrad is a 77 y.o. female with medical history significant of hyperlipidemia and hypothyroidism who presented with complaints of abdominal pain that started around 5:30 AM.  Patient had her annual physical exam with her primary care provider  where he had felt a possible mass on her abdomen at the end of last month.  She had ultrasound which a showed solid mass extending into the lumen of the bladder on the righ on 10/29.  Thereafter a CT scan of the abdomen pelvis have been ordered for further evaluation.  CT scan of the abdomen pelvis from 11/14 which noted 11 x 9 cm heterogeneous solid and cystic mass arising from the uterine fundus invading the urinary bladder dome wall as well as the distal sigmoid colon without signs of obstruction.  At that time patient had not reported any complaints of abdominal pain.  She was scheduled to follow-up with Dr. Alvester Morin this morning at 12:45 PM.  However this morning had awoken to severe pain in the right lower quadrant of her abdomen. Pain was sharp. Noted associated nausea, but had not had any vomiting.  Patient words after the CT scan she had a large bowel movement, but had not had any since.  Denies any prior history of abdominal surgeries or known family history for ovarian or uterine cancer.  Denies having any significant fever, cough, shortness of breath, dysuria, or blood in stool/urine.  Her weight has been stable at around 110 pounds.  In the emergency department patient was noted to be afebrile with stable vital signs.  Labs significant for WBC 11.3, glucose 145, and lipase 56.  Urinalysis did not note significant signs for infection.  CT scan of the abdomen  pelvis noted multiple uterine fibroids with a area of the anterior cervix that appears to obliterate the fat plane between the cervix and the posterior wall of the bladder that is concerning for cervical mass, soft tissue nodules in the cul-de-sac concerning for peritoneal nodularity, and 16 mm short axis portal caval lymph node for which metastatic disease was not ruled out.  Dr. Alvester Morin of gynecology oncology have been consulted.  Patient had been given 1 L normal saline IV fluids, morphine IV, and Dilaudid.   Review of Systems: As mentioned in the history of present illness. All other systems reviewed and are negative. Past Medical History:  Diagnosis Date   Hyperlipidemia    Hypothyroidism    Past Surgical History:  Procedure Laterality Date   BROW LIFT Bilateral 05/17/2022   Procedure: BILATERAL UPPER BLEPHAROPLASTY WITH PTOSIS REPAIR;  Surgeon: Janne Napoleon, MD;  Location: [facility] OR;  Service: Plastics;  Laterality: Bilateral;  1.5 hours   BUNIONECTOMY Left    EYE SURGERY     SHOULDER ARTHROSCOPY     TONSILLECTOMY     Social History:  reports that she quit smoking about 35 years ago. Her smoking use included cigarettes. She has never used smokeless tobacco. She reports current alcohol use of about 14.0 standard drinks of alcohol per week. She reports that she does not use drugs.  No Known Allergies  Family History  Problem Relation Age of Onset   Lung cancer Mother    Stroke Mother 38  Alzheimer's disease Father    CAD Father        95% occlusion of L Main & RCA, severe descending aorta, arterial & arteriolonephrosclerosis    Cholecystitis Father    Esophagitis Father    BRCA 1/2 Neg Hx    Breast cancer Neg Hx    Colon cancer Neg Hx    Ovarian cancer Neg Hx    Endometrial cancer Neg Hx    Pancreatic cancer Neg Hx    Prostate cancer Neg Hx     Prior to Admission medications   Medication Sig Start Date End Date Taking? Authorizing Provider  atorvastatin (LIPITOR) 40 MG  tablet Take 40 mg by mouth at bedtime.   Yes [provider]  Calcium Carb-Cholecalciferol (SUPER CALCIUM 600 + D 400 PO) Take 1 tablet by mouth daily.   Yes [provider]  Cholecalciferol (VITAMIN D) 50 MCG (2000 UT) tablet Take 2,000 Units by mouth daily.   Yes [provider]  Coenzyme Q10 (COQ10) 400 MG CAPS Take 400 mg by mouth daily.   Yes [provider]  doxylamine, Sleep, (UNISOM) 25 MG tablet Take 25 mg by mouth at bedtime as needed for sleep.   Yes [provider]  Glucosamine-Chondroit-Vit C-Mn (GLUCOSAMINE 1500 COMPLEX PO) Take 1 tablet by mouth daily.   Yes [provider]  levothyroxine (SYNTHROID) 75 MCG tablet Take 75 mcg by mouth daily before breakfast.   Yes [provider]  Magnesium 250 MG TABS Take 250 mg by mouth daily.   Yes [provider]  meloxicam (MOBIC) 7.5 MG tablet Take 7.5 mg by mouth daily. 07/28/21  Yes [provider]  Multiple Vitamins-Minerals (PRESERVISION AREDS 2+MULTI VIT PO) Take 1 tablet by mouth 2 (two) times daily. 11/12/19  Yes [provider]  Omega-3 Fatty Acids (OMEGA 3 500) 500 MG CAPS Take 500 mg by mouth daily.   Yes [provider]  COMIRNATY syringe  09/08/22   [provider]    Physical Exam: Vitals:   10/30/23 1130 10/30/23 1145 10/30/23 1230 10/30/23 1315  BP: (!) 155/76 132/74 118/69 133/78  Pulse: 73 81 79 75  Resp: 15 15 15 15   Temp:      TempSrc:      SpO2: 93% 95% 96% 98%  Weight:      Height:        Constitutional: Elderly female currently in NAD, calm, comfortable Eyes: PERRL, lids and conjunctivae normal ENMT: Mucous membranes are moist.  Normal dentition.  Neck: normal, supple, no masses, no thyromegaly Respiratory: clear to auscultation bilaterally, no wheezing, no crackles. Normal respiratory effort. No accessory muscle use.  Cardiovascular: Regular rate and rhythm, no murmurs / rubs / gallops. No extremity  edema. 2+ pedal pulses. No carotid bruits.  Abdomen: Tenderness palpation along the right lower quadrant of the abdomen with palpable mass present.  Bowel sounds present in all 4 quadrants. Musculoskeletal: no clubbing / cyanosis. No joint deformity upper and lower extremities. Good ROM, no contractures. Normal muscle tone.  Skin: no rashes, lesions, ulcers. No induration Neurologic: CN 2-12 grossly intact.   Strength 5/5 in all 4.  Psychiatric: Normal judgment and insight. Alert and oriented x 3. Normal mood.   Data Reviewed:   Reviewed labs, imaging, and pertinent records as documented.  Assessment and Plan:   Abdominal pain secondary to abdominal mass Acute.  Patient had been walking out of her sleep this morning at 5:30 AM with severe sharp pain in the  lower right quadrant of her abdomen.11 x 9 cm heterogeneous solid and cystic mass arising from the uterine fundus invading the urinary bladder dome wall as well as the distal sigmoid colon without signs of obstruction. -Admit to a MedSurg bed -Hydrocodone/morphine IV as needed for moderate to severe pain respectively -Check CT scan of the chest and MRI of the abdomen pelvis as recommended per OB/GYN -Appreciate gynecology consultative services,  will follow-up for any further recommendations.  Leukocytosis Acute.  WBC elevated at 11.3.  Urinalysis showed no signs for infection.  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  DVT prophylaxis: Lovenox Advance Care Planning:   Code Status: Full Code  Consults: Gynecology  Family Communication: Son updated at bedside  Severity of Illness: The appropriate patient status for this patient is OBSERVATION. Observation status is judged to be reasonable and necessary in order to provide the required intensity  of service to ensure the patient's safety. The patient's presenting symptoms, physical exam findings, and initial radiographic and laboratory data in the context of their medical condition is felt to place them at decreased risk for further clinical deterioration. Furthermore, it is anticipated that the patient will be medically stable for discharge from the hospital within 2 midnights of admission.   Author: Clydie Braun, MD 10/30/2023 1:47 PM  For on call review [email or web address].

## 2023-10-30 NOTE — ED Provider Notes (Signed)
[place] EMERGENCY DEPARTMENT AT [facility] Provider Note   CSN: [id number] Arrival date & time: 10/30/23  0720     History  Chief Complaint  Patient presents with   Abdominal Pain    Lori Conrad is a 77 y.o. female.  77 yo F with a chief complaints of abdominal discomfort.  This started about 3 hours ago.  Sharp and severe.  She has a recent history of being diagnosed with a uterine mass.  She has follow-up today with gyn onc at 1230.  She has had nausea but no vomiting.  No diarrhea.  No bowel movement in at least couple days.  No prior abdominal surgeries.   Abdominal Pain      Home Medications Prior to Admission medications   Medication Sig Start Date End Date Taking? Authorizing Provider  atorvastatin (LIPITOR) 40 MG tablet Take 40 mg by mouth at bedtime.   Yes [provider]  Calcium Carb-Cholecalciferol (SUPER CALCIUM 600 + D 400 PO) Take 1 tablet by mouth daily.   Yes [provider]  Cholecalciferol (VITAMIN D) 50 MCG (2000 UT) tablet Take 2,000 Units by mouth daily.   Yes [provider]  Coenzyme Q10 (COQ10) 400 MG CAPS Take 400 mg by mouth daily.   Yes [provider]  doxylamine, Sleep, (UNISOM) 25 MG tablet Take 25 mg by mouth at bedtime as needed for sleep.   Yes [provider]  Glucosamine-Chondroit-Vit C-Mn (GLUCOSAMINE 1500 COMPLEX PO) Take 1 tablet by mouth daily.   Yes [provider]  levothyroxine (SYNTHROID) 75 MCG tablet Take 75 mcg by mouth daily before breakfast.   Yes [provider]  Magnesium 250 MG TABS Take 250 mg by mouth daily.   Yes [provider]  meloxicam (MOBIC) 7.5 MG tablet Take 7.5 mg by mouth daily. 07/28/21  Yes [provider]  Multiple Vitamins-Minerals (PRESERVISION AREDS 2+MULTI VIT PO) Take 1 tablet by mouth 2 (two) times daily. 11/12/19  Yes [provider]  Omega-3 Fatty Acids (OMEGA 3 500) 500 MG CAPS Take 500 mg by  mouth daily.   Yes [provider]  COMIRNATY syringe  09/08/22   [provider]      Allergies    Patient has no known allergies.    Review of Systems   Review of Systems  Gastrointestinal:  Positive for abdominal pain.    Physical Exam Updated Vital Signs BP 133/78   Pulse 75   Temp 98.5 F (36.9 C)   Resp 15   Ht 5\' 1"  (1.549 m)   Wt 49.4 kg   SpO2 98%   BMI 20.60 kg/m  Physical Exam Vitals and nursing note reviewed.  Constitutional:      General: She is not in acute distress.    Appearance: She is well-developed. She is not diaphoretic.  HENT:     Head: Normocephalic and atraumatic.  Eyes:     Pupils: Pupils are equal, round, and reactive to light.  Cardiovascular:     Rate and Rhythm: Normal rate and regular rhythm.     Heart sounds: No murmur heard.    No friction rub. No gallop.  Pulmonary:     Effort: Pulmonary effort is normal.     Breath sounds: No wheezing or rales.  Abdominal:     General: There is no distension.     Palpations: Abdomen is soft.     Tenderness: There is abdominal tenderness.     Comments:  Patient points to the right upper quadrant as area of most pain though is diffusely tender on abdominal exam without focality.  Musculoskeletal:        General: No tenderness.     Cervical back: Normal range of motion and neck supple.  Skin:    General: Skin is warm and dry.  Neurological:     Mental Status: She is alert and oriented to person, place, and time.  Psychiatric:        Behavior: Behavior normal.     ED Results / Procedures / Treatments   Labs (all labs ordered are listed, but only abnormal results are displayed) Labs Reviewed  LIPASE, BLOOD - Abnormal; Notable for the following components:      Result Value   Lipase 56 (*)    All other components within normal limits  COMPREHENSIVE METABOLIC PANEL - Abnormal; Notable for the following components:   Glucose, Bld 145 (*)    Total Protein 6.3 (*)    All other  components within normal limits  CBC - Abnormal; Notable for the following components:   WBC 11.3 (*)    RBC 5.14 (*)    HCT 47.0 (*)    All other components within normal limits  URINALYSIS, ROUTINE W REFLEX MICROSCOPIC - Abnormal; Notable for the following components:   APPearance HAZY (*)    Ketones, ur 5 (*)    Leukocytes,Ua TRACE (*)    All other components within normal limits    EKG None  Radiology CT ABDOMEN PELVIS W CONTRAST  Result Date: 10/30/2023 CLINICAL DATA:  Abdominal pain. EXAM: CT ABDOMEN AND PELVIS WITH CONTRAST TECHNIQUE: Multidetector CT imaging of the abdomen and pelvis was performed using the standard protocol following bolus administration of intravenous contrast. RADIATION DOSE REDUCTION: This exam was performed according to the departmental dose-optimization program which includes automated exposure control, adjustment of the mA and/or kV according to patient size and/or use of iterative reconstruction technique. CONTRAST:  75mL OMNIPAQUE IOHEXOL 350 MG/ML SOLN COMPARISON:  Limb 1424 FINDINGS: Lower chest: No acute findings. Hepatobiliary: Multiple hepatic cysts evident. Scattered tiny hypodensities in the liver parenchyma are too small to characterize but are statistically most likely benign. No followup imaging is recommended. Tiny subcapsular lesion measured previously at 9 mm in the anterior left liver is stable on image 23/3 today, nonspecific. There is no evidence for gallstones, gallbladder wall thickening, or pericholecystic fluid. No intrahepatic or extrahepatic biliary dilation. Pancreas: Dilatation of the pancreatic duct to the head and body of pancreas is similar to prior. Spleen: No splenomegaly. No suspicious focal mass lesion. Adrenals/Urinary Tract: No adrenal nodule or mass. Kidneys unremarkable. No evidence for hydroureter. Bladder is distended. Stomach/Bowel: Stomach is unremarkable. No gastric wall thickening. No evidence of outlet obstruction.  Duodenum is normally positioned as is the ligament of Treitz. No small bowel wall thickening. No small bowel dilatation. Diverticular changes are noted in the left colon without evidence of diverticulitis. Vascular/Lymphatic: 16 mm short axis portal caval lymph node seen on 22/3. No para-aortic lymphadenopathy. No pelvic sidewall lymphadenopathy. Reproductive: Multiple uterine fibroids evident. As noted on prior study there is an area of the anterior cervix that appears to obliterate the fat plane between the cervix and the posterior wall of the bladder (see sagittal 86/7). Small soft tissue nodules are again noted in the cul-de-sac some of which may pertain to diverticuli, but others raise concern for peritoneal nodularity (see images 61 and 56 of series 3). Other: No substantial free fluid.  Musculoskeletal: No worrisome lytic or sclerotic osseous abnormality. IMPRESSION: 1. Multiple uterine fibroids. As noted on prior study there is an area of the anterior cervix that appears to obliterate the fat plane between the cervix and the posterior wall of the bladder. This is concerning for a cervical mass. Gynecologic consultation recommended. 2. Small soft tissue nodules in the cul-de-sac some of which may relate to diverticuli, but others raise concern for peritoneal nodularity. Attention on follow-up recommended. PET-CT may prove helpful to further evaluate 3. 16 mm short axis portal caval lymph node, metastatic disease not excluded. 4. Left colonic diverticulosis without diverticulitis. Electronically Signed   By: Kennith Center M.D.   On: 10/30/2023 13:08    Procedures .Critical Care  Performed by: Melene Plan, DO Authorized by: Melene Plan, DO   Critical care provider statement:    Critical care time (minutes):  35   Critical care time was exclusive of:  Separately billable procedures and treating other patients   Critical care was time spent personally by me on the following activities:  Development of  treatment plan with patient or surrogate, discussions with consultants, evaluation of patient's response to treatment, examination of patient, ordering and review of laboratory studies, ordering and review of radiographic studies, ordering and performing treatments and interventions, pulse oximetry, re-evaluation of patient's condition and review of old charts   Care discussed with: admitting provider       Medications Ordered in ED Medications  HYDROmorphone (DILAUDID) injection 0.5 mg (has no administration in time range)  morphine (PF) 4 MG/ML injection 4 mg (4 mg Intravenous Given 10/30/23 0857)  ondansetron (ZOFRAN) injection 4 mg (4 mg Intravenous Given 10/30/23 0857)  sodium chloride 0.9 % bolus 1,000 mL (1,000 mLs Intravenous New Bag/Given 10/30/23 0900)  HYDROmorphone (DILAUDID) injection 0.5 mg (0.5 mg Intravenous Given 10/30/23 0950)  iohexol (OMNIPAQUE) 350 MG/ML injection 75 mL (75 mLs Intravenous Contrast Given 10/30/23 1101)  HYDROmorphone (DILAUDID) injection 0.5 mg (0.5 mg Intravenous Given 10/30/23 1118)    ED Course/ Medical Decision Making/ A&P                                 Medical Decision Making Amount and/or Complexity of Data Reviewed Labs: ordered. Radiology: ordered.  Risk Prescription drug management.   77 yo F with a chief complaints of abdominal pain.  This started suddenly this morning about 3 hours ago.  On my record review the patient has a recent diagnosis of a uterine mass.  There is some invasion of the bladder.  She was scheduled to follow-up today with Gyn onc.   Presentation concerning for possible obstruction.  Will obtain CT imaging.  Lab work with mild leukocytosis renal function at baseline LFTs and lipase are unremarkable.  CT scan with similar read to prior.  Patient has a mass between the cervix and posterior wall of the bladder.  There is some concerning findings for possible metastatic disease.  The patient is feeling a bit better  after 3 IV doses of narcotics.  Will attempt to discuss with Gyn onc.  I discussed case with Dr. Alvester Morin she felt reasonable to admit to the hospitalist.  She will come and evaluate the patient and attempt to obtain some of the workup that she had planned in the office today.  Will discuss with medicine.  The patients results and plan were reviewed and discussed.   Any x-rays performed were independently reviewed  by myself.   Differential diagnosis were considered with the presenting HPI.  Medications  HYDROmorphone (DILAUDID) injection 0.5 mg (has no administration in time range)  morphine (PF) 4 MG/ML injection 4 mg (4 mg Intravenous Given 10/30/23 0857)  ondansetron (ZOFRAN) injection 4 mg (4 mg Intravenous Given 10/30/23 0857)  sodium chloride 0.9 % bolus 1,000 mL (1,000 mLs Intravenous New Bag/Given 10/30/23 0900)  HYDROmorphone (DILAUDID) injection 0.5 mg (0.5 mg Intravenous Given 10/30/23 0950)  iohexol (OMNIPAQUE) 350 MG/ML injection 75 mL (75 mLs Intravenous Contrast Given 10/30/23 1101)  HYDROmorphone (DILAUDID) injection 0.5 mg (0.5 mg Intravenous Given 10/30/23 1118)    Vitals:   10/30/23 1130 10/30/23 1145 10/30/23 1230 10/30/23 1315  BP: (!) 155/76 132/74 118/69 133/78  Pulse: 73 81 79 75  Resp: 15 15 15 15   Temp:      TempSrc:      SpO2: 93% 95% 96% 98%  Weight:      Height:        Final diagnoses:  Generalized abdominal pain    Admission/ observation were discussed with the admitting physician, patient and/or family and they are comfortable with the plan.         Final Clinical Impression(s) / ED Diagnoses Final diagnoses:  Generalized abdominal pain    Rx / DC Orders ED Discharge Orders     None         Melene Plan, DO 10/30/23 1403

## 2023-10-30 NOTE — Progress Notes (Unsigned)
This encounter was created in error - please disregard (patient admitted to hospital - see inpatient consult note).

## 2023-10-31 ENCOUNTER — Encounter (HOSPITAL_COMMUNITY): Payer: Self-pay | Admitting: Internal Medicine

## 2023-10-31 DIAGNOSIS — I714 Abdominal aortic aneurysm, without rupture, unspecified: Secondary | ICD-10-CM | POA: Diagnosis present

## 2023-10-31 DIAGNOSIS — R1900 Intra-abdominal and pelvic swelling, mass and lump, unspecified site: Secondary | ICD-10-CM

## 2023-10-31 DIAGNOSIS — Z801 Family history of malignant neoplasm of trachea, bronchus and lung: Secondary | ICD-10-CM | POA: Diagnosis not present

## 2023-10-31 DIAGNOSIS — K644 Residual hemorrhoidal skin tags: Secondary | ICD-10-CM | POA: Diagnosis present

## 2023-10-31 DIAGNOSIS — D259 Leiomyoma of uterus, unspecified: Secondary | ICD-10-CM | POA: Diagnosis present

## 2023-10-31 DIAGNOSIS — N858 Other specified noninflammatory disorders of uterus: Secondary | ICD-10-CM

## 2023-10-31 DIAGNOSIS — Z8249 Family history of ischemic heart disease and other diseases of the circulatory system: Secondary | ICD-10-CM | POA: Diagnosis not present

## 2023-10-31 DIAGNOSIS — E785 Hyperlipidemia, unspecified: Secondary | ICD-10-CM | POA: Diagnosis present

## 2023-10-31 DIAGNOSIS — Z823 Family history of stroke: Secondary | ICD-10-CM | POA: Diagnosis not present

## 2023-10-31 DIAGNOSIS — Z800 Family history of malignant neoplasm of digestive organs: Secondary | ICD-10-CM | POA: Diagnosis not present

## 2023-10-31 DIAGNOSIS — E039 Hypothyroidism, unspecified: Secondary | ICD-10-CM | POA: Diagnosis present

## 2023-10-31 DIAGNOSIS — R1031 Right lower quadrant pain: Secondary | ICD-10-CM | POA: Diagnosis not present

## 2023-10-31 DIAGNOSIS — I7121 Aneurysm of the ascending aorta, without rupture: Secondary | ICD-10-CM | POA: Diagnosis present

## 2023-10-31 DIAGNOSIS — Z87891 Personal history of nicotine dependence: Secondary | ICD-10-CM | POA: Diagnosis not present

## 2023-10-31 DIAGNOSIS — Z79890 Hormone replacement therapy (postmenopausal): Secondary | ICD-10-CM | POA: Diagnosis not present

## 2023-10-31 DIAGNOSIS — D1803 Hemangioma of intra-abdominal structures: Secondary | ICD-10-CM | POA: Diagnosis present

## 2023-10-31 DIAGNOSIS — R933 Abnormal findings on diagnostic imaging of other parts of digestive tract: Secondary | ICD-10-CM | POA: Diagnosis not present

## 2023-10-31 DIAGNOSIS — Z820 Family history of epilepsy and other diseases of the nervous system: Secondary | ICD-10-CM | POA: Diagnosis not present

## 2023-10-31 DIAGNOSIS — R1084 Generalized abdominal pain: Secondary | ICD-10-CM | POA: Diagnosis present

## 2023-10-31 DIAGNOSIS — K5730 Diverticulosis of large intestine without perforation or abscess without bleeding: Secondary | ICD-10-CM | POA: Diagnosis present

## 2023-10-31 DIAGNOSIS — Z791 Long term (current) use of non-steroidal anti-inflammatories (NSAID): Secondary | ICD-10-CM | POA: Diagnosis not present

## 2023-10-31 DIAGNOSIS — Z79899 Other long term (current) drug therapy: Secondary | ICD-10-CM | POA: Diagnosis not present

## 2023-10-31 LAB — CBC
HCT: 45.7 % (ref 36.0–46.0)
Hemoglobin: 14.5 g/dL (ref 12.0–15.0)
MCH: 29.1 pg (ref 26.0–34.0)
MCHC: 31.7 g/dL (ref 30.0–36.0)
MCV: 91.6 fL (ref 80.0–100.0)
Platelets: 197 10*3/uL (ref 150–400)
RBC: 4.99 MIL/uL (ref 3.87–5.11)
RDW: 13.9 % (ref 11.5–15.5)
WBC: 9.6 10*3/uL (ref 4.0–10.5)
nRBC: 0 % (ref 0.0–0.2)

## 2023-10-31 LAB — BASIC METABOLIC PANEL
Anion gap: 9 (ref 5–15)
BUN: 17 mg/dL (ref 8–23)
CO2: 26 mmol/L (ref 22–32)
Calcium: 9 mg/dL (ref 8.9–10.3)
Chloride: 102 mmol/L (ref 98–111)
Creatinine, Ser: 0.64 mg/dL (ref 0.44–1.00)
GFR, Estimated: >60 mL/min (ref >60)
Glucose, Bld: 94 mg/dL (ref 70–99)
Potassium: 4.2 mmol/L (ref 3.5–5.1)
Sodium: 137 mmol/L (ref 135–145)

## 2023-10-31 MED ORDER — HYDROCODONE-ACETAMINOPHEN 5-325 MG PO TABS
1.0000 | ORAL_TABLET | ORAL | Status: DC | PRN
  Administered 2023-10-31 – 2023-11-01 (×3): 2 via ORAL
  Administered 2023-11-02 – 2023-11-03 (×2): 1 via ORAL
  Filled 2023-10-31: qty 1
  Filled 2023-10-31: qty 2
  Filled 2023-10-31: qty 1
  Filled 2023-10-31 (×2): qty 2

## 2023-10-31 NOTE — Plan of Care (Signed)

## 2023-10-31 NOTE — Progress Notes (Signed)
Inpatient Gyn Onc Progress Note  Subjective: Patient reports that her pain control is much improved. She has possibly passed some gas. No bowel movement since her CT scan on 10/26/23. Last colonoscopy may have been almost 20 years ago. Denies hematuria, vaginal bleeding or rectal bleeding.  Objective: Vital signs in last 24 hours: Temp:  [98.2 F (36.8 C)-98.8 F (37.1 C)] 98.7 F (37.1 C) (11/19 1452) Pulse Rate:  [71-78] 71 (11/19 1452) Resp:  [18-19] 19 (11/19 1452) BP: (109-135)/(68-84) 109/77 (11/19 1452) SpO2:  [89 %-96 %] 96 % (11/19 1452)    Intake/Output from previous day: 11/18 0701 - 11/19 0700 In: 1003 [I.V.:3; IV Piggyback:1000] Out: -   Physical Examination: General: Alert, oriented, no acute distress. HEENT: Normocephalic, atraumatic. Neck symmetric without masses. Sclera anicteric.  Chest: Normal work of breathing.  Cardiovascular: Regular rate. Abdomen: Soft, mildly TTP.   Extremities: Grossly normal range of motion.  Warm, well perfused.  No edema bilaterally. Skin: No rashes or lesions noted. GU: Normal appearing external genitalia without erythema, excoriation, or lesions.  Speculum exam reveals normal vaginal mucosa and cervix.  Bimanual exam reveals normal cervix. Mass indistinguishable from the uterus, approximately 12cm in size, with some mobility.  Rectovaginal exam nodular mass palpated in posterior cul-de-sac, fixed to colon, possible extension to mucosa. Exam chaperoned by Warner Mccreedy, NP  EMB Procedure: After appropriate verbal informed consent was obtained, a timeout was performed. A sterile speculum was placed in the vagina, and the area was cleaned with betadine x3. A single-tooth tenaculum was placed on the anterior lip of the cervix.  An endometrial biopsy pipelle was advanced carefully to the uterine fundus which sounded to 8cm. A minimal sample was obtained over 2 passes. The tenaculum was removed, and tenaculum sites were noted to be hemostatic.  The speculum was removed from the vagina. The patient tolerated the procedure well.    Labs: WBC/Hgb/Hct/Plts:  9.6/14.5/45.7/197 (11/19 0740) BUN/Cr/glu/ALT/AST/amyl/lip:  17/0.64/--/--/--/--/-- (11/19 0740)  Assessment: 77 y.o. with a complex solid and cystic pelvic mass, which on MRI appears to be more consistent with an ovarian mass (as opposed to a uterine mass as previously suspected based on CT scan) with peritoneal implants in the posterior cul-de-sac and a mass along the distal sigmoid colon. No obvious bladder invasion based on MRI. And indeterminate liver lesion from CT looks likely c/w hemangioma on MRI abdomen.   Given original concern for possible uterine mass, endometrial biopsy performed today. Had counseled pt on false negative rate of endometrial biopsy in evaluation of uterine sarcoma prior to procedure. See procedure note above. Minimal tissue was obtained, and we had reviewed with pt that this would most likely return benign. Also unsurprising at this time given MRI read which resulted prior to the procedure.  Patient updated on MRI read. MRI read together with my rectovaginal exam findings concerning for either a primary colonic mass or an ovarian peritoneal met that is invading into the colon, feel that a colonoscopy would be best next step in work-up. Will provide tissue diagnosis if mass identified and can be biopsied. Treatment plan following tissue diagnosis  Recommendations: - Sigmoidoscopy vs colonoscopy for evaluation of colonic mass, possible tissue biopsy - Tumor markers added on (CA125, CEA, CA 19-9) - Will follow-up results and can make treatment recommendations pending results - If sigmoidoscopy provides a tissue biopsy and pt is otherwise clinically well, pain controlled, pt likely could be discharged for outpatient follow-up of results following procedure.   LOS: 0 days  Jiovanna Frei 10/31/2023, 3:17 PM

## 2023-10-31 NOTE — Progress Notes (Signed)
PROGRESS NOTE    Lori Conrad  VOZ:[id number] DOB: 01-01-1946 DOA: 10/30/2023 PCP: Henrine Screws, MD   Brief Narrative:  77 year old female with history of hyperlipidemia and hypothyroidism presented with worsening abdominal pain and was found to have pelvic mass on imaging.  GYN oncology was consulted.  Assessment & Plan:   Abdominal mass secondary to pelvic/cervical mass -GYN oncology following and planning for possible endometrial biopsy today.  CT chest showed no signs of metastatic disease.  MRI of abdomen/pelvis pending. -Continue pain management.  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  Outpatient follow-up     DVT prophylaxis: Lovenox Code Status: Full Family Communication: Friend at bedside Disposition Plan: Status is: Observation The patient will require care spanning > 2 midnights and should be moved to inpatient because: Of severity of illness    Consultants: GYN oncology  Procedures: None  Antimicrobials: None  Subjective: Patient seen and examined at bedside.  Continues to have intermittent severe lower abdominal pain.  No fever, vomiting, chest pain reported.  Objective: Vitals:   10/30/23 1451 10/30/23 2029 10/31/23 0613 10/31/23 0729  BP: 121/69 135/68 134/69 135/84  Pulse: 78 73 76 78  Resp: 18 18 18 19   Temp: 98.9 F (37.2 C) 98.2 F (36.8 C) 98.8 F (37.1 C) 98.5 F (36.9 C)  TempSrc: Oral Oral Oral Oral  SpO2: (!) 87% 91% (!) 89% 91%  Weight:      Height:        Intake/Output Summary (Last 24 hours) at 10/31/2023 0800 Last data filed at 10/30/2023 1500 Gross per 24 hour  Intake 1003 ml  Output --  Net 1003 ml   Filed Weights   10/30/23 0731 10/30/23 0958  Weight: 51.5 kg 49.4 kg     Examination:  General: On room air.  No distress.  respiratory: Decreased breath sounds at bases bilaterally with some crackles CVS: Currently rate controlled; S1-S2 heard  abdominal: Soft, mild tender in the lower quadrant, slightly distended, no organomegaly; normal bowel sounds are heard  extremities: Trace lower extremity edema; no clubbing.       Data Reviewed: I have personally reviewed following labs and imaging studies  CBC: Recent Labs  Lab 10/30/23 0739  WBC 11.3*  HGB 14.9  HCT 47.0*  MCV 91.4  PLT 184   Basic Metabolic Panel: Recent Labs  Lab 10/30/23 0739  NA 137  K 3.8  CL 102  CO2 25  GLUCOSE 145*  BUN 18  CREATININE 0.66  CALCIUM 9.6   GFR: Estimated Creatinine Clearance: 43.7 mL/min (by C-G formula based on SCr of 0.66 mg/dL). Liver Function Tests: Recent Labs  Lab 10/30/23 0739  AST 29  ALT 16  ALKPHOS 66  BILITOT 0.9  PROT 6.3*  ALBUMIN 3.8   Recent Labs  Lab 10/30/23 0739  LIPASE 56*   No results for input(s): "AMMONIA" in the last 168 hours. Coagulation Profile: No results for input(s): "INR", "PROTIME" in the last 168 hours. Cardiac Enzymes: No results for input(s): "CKTOTAL", "CKMB", "CKMBINDEX", "TROPONINI" in the last 168 hours. BNP (last 3 results) No results for input(s): "PROBNP" in the last 8760 hours. HbA1C: No results for input(s): "HGBA1C" in the last 72 hours. CBG: No results for input(s): "GLUCAP" in the last 168 hours. Lipid Profile: No results for input(s): "CHOL", "HDL", "LDLCALC", "  TRIG", "CHOLHDL", "LDLDIRECT" in the last 72 hours. Thyroid Function Tests: No results for input(s): "TSH", "T4TOTAL", "FREET4", "T3FREE", "THYROIDAB" in the last 72 hours. Anemia Panel: No results for input(s): "VITAMINB12", "FOLATE", "FERRITIN", "TIBC", "IRON", "RETICCTPCT" in the last 72 hours. Sepsis Labs: No results for input(s): "PROCALCITON", "LATICACIDVEN" in the last 168 hours.  No results found for this or any  previous visit (from the past 240 hour(s)).       Radiology Studies: CT CHEST W CONTRAST  Result Date: 10/31/2023 CLINICAL DATA:  77 year old female with suspected gynecologic malignancy. * Tracking Code: BO * EXAM: CT CHEST WITH CONTRAST TECHNIQUE: Multidetector CT imaging of the chest was performed during intravenous contrast administration. RADIATION DOSE REDUCTION: This exam was performed according to the departmental dose-optimization program which includes automated exposure control, adjustment of the mA and/or kV according to patient size and/or use of iterative reconstruction technique. CONTRAST:  75mL OMNIPAQUE IOHEXOL 350 MG/ML SOLN COMPARISON:  None Available. FINDINGS: Cardiovascular: The heart size is mildly enlarged. No pericardial effusion. Aortic atherosclerosis and coronary artery calcification. Mediastinum/Nodes: Trachea and esophagus appear unremarkable. The right lobe of thyroid gland appears surgically absent. No mediastinal or hilar adenopathy. Lungs/Pleura: No pleural effusion identified. Subsegmental atelectasis identified within the lingula and bilateral posterior lung bases. No signs of interstitial edema or airspace consolidation. No suspicious pulmonary nodule identified to suggest lung metastases. Upper Abdomen: No acute abnormality. Multiple liver cysts. Enlarged lymph node within the portal caval region measures 1.5 cm, image 155/3. Defer to report from CT AP dated 10/30/2019 for and same-day MRI of the abdomen pelvis for further details. Musculoskeletal: Mild curvature of the thoracic spine and lumbar spine is convex towards the left. Multilevel degenerative disc disease. No acute or suspicious osseous lesions. IMPRESSION: 1. No signs of metastatic disease to the chest. 2. Areas of subsegmental atelectasis noted within bilateral posterior lung bases and lingula. 3. Enlarged upper abdominal lymph node as above. In the setting of a known malignancy this is concerning for nodal  metastasis. 4. Coronary artery calcifications. 5.  Aortic Atherosclerosis (ICD10-I70.0). Electronically Signed   By: Signa Kell M.D.   On: 10/31/2023 05:57   CT ABDOMEN PELVIS W CONTRAST  Result Date: 10/30/2023 CLINICAL DATA:  Abdominal pain. EXAM: CT ABDOMEN AND PELVIS WITH CONTRAST TECHNIQUE: Multidetector CT imaging of the abdomen and pelvis was performed using the standard protocol following bolus administration of intravenous contrast. RADIATION DOSE REDUCTION: This exam was performed according to the departmental dose-optimization program which includes automated exposure control, adjustment of the mA and/or kV according to patient size and/or use of iterative reconstruction technique. CONTRAST:  75mL OMNIPAQUE IOHEXOL 350 MG/ML SOLN COMPARISON:  Limb 1424 FINDINGS: Lower chest: No acute findings. Hepatobiliary: Multiple hepatic cysts evident. Scattered tiny hypodensities in the liver parenchyma are too small to characterize but are statistically most likely benign. No followup imaging is recommended. Tiny subcapsular lesion measured previously at 9 mm in the anterior left liver is stable on image 23/3 today, nonspecific. There is no evidence for gallstones, gallbladder wall thickening, or pericholecystic fluid. No intrahepatic or extrahepatic biliary dilation. Pancreas: Dilatation of the pancreatic duct to the head and body of pancreas is similar to prior. Spleen: No splenomegaly. No suspicious focal mass lesion. Adrenals/Urinary Tract: No adrenal nodule or mass. Kidneys unremarkable. No evidence for hydroureter. Bladder is distended. Stomach/Bowel: Stomach is unremarkable. No gastric wall thickening. No evidence of outlet obstruction. Duodenum is normally positioned as is the ligament of Treitz. No small bowel wall thickening. No  small bowel dilatation. Diverticular changes are noted in the left colon without evidence of diverticulitis. Vascular/Lymphatic: 16 mm short axis portal caval lymph node  seen on 22/3. No para-aortic lymphadenopathy. No pelvic sidewall lymphadenopathy. Reproductive: Multiple uterine fibroids evident. As noted on prior study there is an area of the anterior cervix that appears to obliterate the fat plane between the cervix and the posterior wall of the bladder (see sagittal 86/7). Small soft tissue nodules are again noted in the cul-de-sac some of which may pertain to diverticuli, but others raise concern for peritoneal nodularity (see images 61 and 56 of series 3). Other: No substantial free fluid. Musculoskeletal: No worrisome lytic or sclerotic osseous abnormality. IMPRESSION: 1. Multiple uterine fibroids. As noted on prior study there is an area of the anterior cervix that appears to obliterate the fat plane between the cervix and the posterior wall of the bladder. This is concerning for a cervical mass. Gynecologic consultation recommended. 2. Small soft tissue nodules in the cul-de-sac some of which may relate to diverticuli, but others raise concern for peritoneal nodularity. Attention on follow-up recommended. PET-CT may prove helpful to further evaluate 3. 16 mm short axis portal caval lymph node, metastatic disease not excluded. 4. Left colonic diverticulosis without diverticulitis. Electronically Signed   By: Kennith Center M.D.   On: 10/30/2023 13:08        Scheduled Meds:  atorvastatin  40 mg Oral QHS   enoxaparin (LOVENOX) injection  40 mg Subcutaneous Q24H   folic acid  1 mg Oral Daily   levothyroxine  75 mcg Oral Q0600   multivitamin with minerals  1 tablet Oral Daily   sodium chloride flush  3 mL Intravenous Q12H   thiamine  100 mg Oral Daily   Or   thiamine  100 mg Intravenous Daily   Continuous Infusions:        Glade Lloyd, MD [facility] 10/31/2023, 8:00 AM

## 2023-10-31 NOTE — Consult Note (Signed)
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 Alvester Morin.  Cervical biopsies pending. I agree with with IR CT-guided biopsy of a pelvic mass if feasible, tumor markers and colonoscopy for further evaluation We discussed importance of port placement and she agreed to proceed Given strong family history of malignancy, she would benefit from genetic testing at some point in time in the future I will return to check on her end of the week if results are available  Changes in bowel habits, bloating Laxatives as needed  Discharge planning Not ready due to further investigations pending  The total time spent in the appointment was 80 minutes encounter with patients including review of chart and various tests results, discussions about plan of care and coordination of care plan   All questions were answered. The patient knows to call the clinic with any problems, questions or concerns. No barriers to learning was detected.  Artis Delay, MD 11/19/20243:40 PM  CHIEF COMPLAINTS/PURPOSE OF CONSULTATION:  Large pelvic mass, suspect GYN primary, for further management  HISTORY OF PRESENTING ILLNESS:  Lori Conrad 77 y.o. female is seen at the request by GYN surgeon.  She was evaluated for presentation with abdominal pain and large pelvic mass, suspect GYN primary.  She denies postmenopausal bleeding.  She has strong family history of cancer.  She has 2 children.  Her daughter died 6 years ago from pancreatic cancer at the age of 40.  She was recently widowed.  Her husband died from recent bile duct cancer  She denies significant changes in bowel habits but has noticed intermittent pain and bloating recently.  Her previous colonoscopy was almost 20 years ago.  She had frequent colonoscopy at some point  for unknown etiology.  On October 29 of this year, she underwent ultrasound evaluation due to palpable pelvic mass by primary care doctor.  It appears to have abnormal solid mass extending into the bladder on the right side.  On October 26, 2023, CT imaging showed abnormal mass in the uterus with possible invasion into bladder and distal sigmoid colon.  There are no signs of bowel obstruction.  There are also multiple abnormalities noted on her liver  Yesterday, she was admitted due to severe abdominal pain and had repeat imaging study.  MRI of the abdomen show very mild ascites.  The abdomen confirm possible benign lesions in her liver.  On exam today, she continues to have intermittent abdominal pain.  Denies abnormal vaginal bleeding.  No changes in her urination.  She denies recent weight loss   MEDICAL HISTORY:  Past Medical History:  Diagnosis Date   Hyperlipidemia    Hypothyroidism    Macular degeneration     SURGICAL HISTORY: Past Surgical History:  Procedure Laterality Date   ANKLE FRACTURE SURGERY Left    BROW LIFT Bilateral 05/17/2022   Procedure: BILATERAL UPPER BLEPHAROPLASTY WITH PTOSIS REPAIR;  Surgeon: Janne Napoleon, MD;  Location: [facility] OR;  Service: Plastics;  Laterality: Bilateral;  1.5 hours   BUNIONECTOMY Left    EYE SURGERY     REFRACTIVE SURGERY     laser for macular degeneration   SHOULDER ARTHROSCOPY     TONSILLECTOMY      SOCIAL HISTORY: Social History   Socioeconomic History   Marital status: Widowed    Spouse name: Not  on file   Number of children: 2   Years of education: Not on file   Highest education level: Not on file  Occupational History   Not on file  Tobacco Use   Smoking status: Former    Current packs/day: 0.00    Types: Cigarettes    Quit date: 05/15/1988    Years since quitting: 35.4   Smokeless tobacco: Never  Vaping Use   Vaping status: Never Used  Substance and Sexual Activity   Alcohol use: Yes    Alcohol/week: 14.0  standard drinks of alcohol    Types: 14 drink(s) per week    Comment: white wine   Drug use: No   Sexual activity: Not Currently    Partners: Male  Other Topics Concern   Not on file  Social History Narrative   Not on file   Social Determinants of Health   Financial Resource Strain: Not on file  Food Insecurity: No Food Insecurity (10/30/2023)   Hunger Vital Sign    Worried About Running Out of Food in the Last Year: Never true    Ran Out of Food in the Last Year: Never true  Transportation Needs: No Transportation Needs (10/30/2023)   PRAPARE - Administrator, Civil Service (Medical): No    Lack of Transportation (Non-Medical): No  Physical Activity: Not on file  Stress: Not on file  Social Connections: Not on file  Intimate Partner Violence: Not At Risk (10/30/2023)   Humiliation, Afraid, Rape, and Kick questionnaire    Fear of Current or Ex-Partner: No    Emotionally Abused: No    Physically Abused: No    Sexually Abused: No    FAMILY HISTORY: Family History  Problem Relation Age of Onset   Lung cancer Mother    Stroke Mother 69   Alzheimer's disease Father    CAD Father        95% occlusion of L Main & RCA, severe descending aorta, arterial & arteriolonephrosclerosis    Cholecystitis Father    Esophagitis Father    Pancreatic cancer Daughter    BRCA 1/2 Neg Hx    Breast cancer Neg Hx    Colon cancer Neg Hx    Ovarian cancer Neg Hx    Endometrial cancer Neg Hx    Prostate cancer Neg Hx     ALLERGIES:  has No Known Allergies.  MEDICATIONS:  Current Facility-Administered Medications  Medication Dose Route Frequency Provider Last Rate Last Admin   acetaminophen (TYLENOL) tablet 650 mg  650 mg Oral Q6H PRN Clydie Braun, MD       Or   acetaminophen (TYLENOL) suppository 650 mg  650 mg Rectal Q6H PRN Madelyn Flavors A, MD       albuterol (PROVENTIL) (2.5 MG/3ML) 0.083% nebulizer solution 2.5 mg  2.5 mg Nebulization Q6H PRN Katrinka Blazing, Rondell A, MD        atorvastatin (LIPITOR) tablet 40 mg  40 mg Oral QHS Smith, Rondell A, MD   40 mg at 10/30/23 2207   doxylamine (Sleep) (UNISOM) tablet 25 mg  25 mg Oral QHS PRN Madelyn Flavors A, MD       enoxaparin (LOVENOX) injection 40 mg  40 mg Subcutaneous Q24H Smith, Rondell A, MD   40 mg at 10/30/23 2207   folic acid (FOLVITE) tablet 1 mg  1 mg Oral Daily Katrinka Blazing, Rondell A, MD   1 mg at 10/31/23 0815   hydrALAZINE (APRESOLINE) injection 10 mg  10 mg Intravenous  Q4H PRN Clydie Braun, MD       HYDROcodone-acetaminophen (NORCO/VICODIN) 5-325 MG per tablet 1-2 tablet  1-2 tablet Oral Q4H PRN Glade Lloyd, MD       levothyroxine (SYNTHROID) tablet 75 mcg  75 mcg Oral Q0600 Madelyn Flavors A, MD   75 mcg at 10/31/23 0615   LORazepam (ATIVAN) tablet 1-4 mg  1-4 mg Oral Q1H PRN Clydie Braun, MD       Or   LORazepam (ATIVAN) injection 1-4 mg  1-4 mg Intravenous Q1H PRN Madelyn Flavors A, MD       morphine (PF) 2 MG/ML injection 2 mg  2 mg Intravenous Q2H PRN Madelyn Flavors A, MD   2 mg at 10/31/23 1156   multivitamin with minerals tablet 1 tablet  1 tablet Oral Daily Madelyn Flavors A, MD   1 tablet at 10/31/23 0815   sodium chloride flush (NS) 0.9 % injection 3 mL  3 mL Intravenous Q12H Smith, Rondell A, MD   3 mL at 10/31/23 0815   thiamine (VITAMIN B1) tablet 100 mg  100 mg Oral Daily Madelyn Flavors A, MD   100 mg at 10/31/23 4098   Or   thiamine (VITAMIN B1) injection 100 mg  100 mg Intravenous Daily Smith, Rondell A, MD        REVIEW OF SYSTEMS:   Constitutional: Denies fevers, chills or abnormal night sweats Eyes: Denies blurriness of vision, double vision or watery eyes Ears, nose, mouth, throat, and face: Denies mucositis or sore throat Respiratory: Denies cough, dyspnea or wheezes Cardiovascular: Denies palpitation, chest discomfort or lower extremity swelling Skin: Denies abnormal skin rashes Lymphatics: Denies new lymphadenopathy or easy bruising Neurological:Denies numbness, tingling or  new weaknesses Behavioral/Psych: Mood is stable, no new changes  All other systems were reviewed with the patient and are negative.  PHYSICAL EXAMINATION: ECOG PERFORMANCE STATUS: 1 - Symptomatic but completely ambulatory  Vitals:   10/31/23 0729 10/31/23 1452  BP: 135/84 109/77  Pulse: 78 71  Resp: 19 19  Temp: 98.5 F (36.9 C) 98.7 F (37.1 C)  SpO2: 91% 96%   Filed Weights   10/30/23 0731 10/30/23 0958  Weight: 113 lb 8.6 oz (51.5 kg) 109 lb (49.4 kg)    GENERAL:alert, no distress and comfortable SKIN: skin color, texture, turgor are normal, no rashes or significant lesions EYES: normal, conjunctiva are pink and non-injected, sclera clear OROPHARYNX:no exudate, no erythema and lips, buccal mucosa, and tongue normal  NECK: supple, thyroid normal size, non-tender, without nodularity LYMPH:  no palpable lymphadenopathy in the cervical, axillary or inguinal LUNGS: clear to auscultation and percussion with normal breathing effort HEART: regular rate & rhythm and no murmurs and no lower extremity edema ABDOMEN:abdomen soft, noted significant distention abnormal palpable mass arising from the suprapubic region, non-tender and normal bowel sounds Musculoskeletal:no cyanosis of digits and no clubbing  PSYCH: alert & oriented x 3 with fluent speech NEURO: no focal motor/sensory deficits  LABORATORY DATA:  I have reviewed the data as listed Lab Results  Component Value Date   WBC 9.6 10/31/2023   HGB 14.5 10/31/2023   HCT 45.7 10/31/2023   MCV 91.6 10/31/2023   PLT 197 10/31/2023   Recent Labs    10/30/23 0739 10/31/23 0740  NA 137 137  K 3.8 4.2  CL 102 102  CO2 25 26  GLUCOSE 145* 94  BUN 18 17  CREATININE 0.66 0.64  CALCIUM 9.6 9.0  GFRNONAA >60 >60  PROT 6.3*  --  ALBUMIN 3.8  --   AST 29  --   ALT 16  --   ALKPHOS 66  --   BILITOT 0.9  --     RADIOGRAPHIC STUDIES: I have personally reviewed her CT imaging I have personally reviewed the radiological  images as listed and agreed with the findings in the report. MR ABDOMEN W WO CONTRAST  Result Date: 10/31/2023 CLINICAL DATA:  Pelvic mass.  Liver lesion EXAM: MRI ABDOMEN WITHOUT AND WITH CONTRAST TECHNIQUE: Multiplanar multisequence MR imaging of the abdomen was performed both before and after the administration of intravenous contrast. CONTRAST:  5mL GADAVIST GADOBUTROL 1 MMOL/ML IV SOLN COMPARISON:  CT 10/30/2023 and older. FINDINGS: Lower chest: Trace pleural fluid. Hepatobiliary: Numerous bright T2, low T1 nonenhancing foci identified consistent with benign cystic lesions. Many of these are under 15 mm. There are some larger foci identified such as segment 4 measuring 4.3 cm and caudate measuring 2.8 cm. Prior CT did demonstrate 1 lesion which is more complex anteriorly in the left hepatic lobe, segment 3 which on today's examination when taking into account motion show some progressive enhancement, is bright on T2 but not as bright as simple fluid and consistent with a small hemangioma. No specific aggressive liver lesion clearly identified today. Patent portal vein. Gallbladder is nondilated. No biliary ductal dilatation. Pancreas: Ectatic pancreatic duct diffusely measuring up to 6 mm, severe. No pancreatic focal atrophy, abnormal enhancement or abnormal T1 signal. No restricted diffusion along the pancreas. Spleen:  Within normal limits in size and appearance. Adrenals/Urinary Tract: Adrenal glands are preserved. No enhancing renal mass or collecting system dilatation. Stomach/Bowel: Visualized bowel is nondilated. This includes visualized portions of the small and large bowel. The stomach is underdistended. Vascular/Lymphatic: Normal caliber aorta and IVC. Atherosclerotic changes along the aorta. Circumaortic left renal vein. Once again there is a abnormal lymph node identified anterior to the aorta in the upper abdomen on series 1602, image 58 measuring 2.3 x 1.8 cm. Few other small retroperitoneal  nodes identified. Other:  Trace ascites.  Mesenteric stranding. Musculoskeletal: Curvature and degenerative changes along the spine. IMPRESSION: Multiple benign-appearing liver lesions including cysts and 1 hemangioma. Persistent enlarged upper abdominal retroperitoneal lymph node. Additional smaller but prominent nodes as well. With the pelvic findings these are worrisome for potential spread of neoplasm. Mild ascites. Electronically Signed   By: Karen Kays M.D.   On: 10/31/2023 14:10   MR PELVIS W WO CONTRAST  Result Date: 10/31/2023 CLINICAL DATA:  Pelvic mass EXAM: MRI PELVIS WITHOUT AND WITH CONTRAST TECHNIQUE: Multiplanar multisequence MR imaging of the pelvis was performed both before and after administration of intravenous contrast. CONTRAST:  5mL GADAVIST GADOBUTROL 1 MMOL/ML IV SOLN COMPARISON:  CT 10/30/2019 FINDINGS: Urinary Tract: Bladder is mildly distended with fluid. There is some mass effect along the posterior aspect of the bladder related to the adjacent complex mass. The bladder itself appears grossly intact. No abnormal wall enhancement. Grossly preserved course of the urethra. Bowel: The visualized bowel in the pelvis is nondilated. However there is lobular masslike area along the distal sigmoid colon with the areas of heterogeneous enhancement. The dominant lesion in this location on series 4, image 29 measures 3.7 by 2.6 cm. There are several adjacent soft tissue nodules as well such as just posterior to the cervix and anterior to the bowel measuring 10 mm on series 22 image 30. Focus superior left lateral series 22, image 26 measures 2.3 x 1.7 cm. Additional foci elsewhere dependently in the  pelvis including the presacral space. Vascular/Lymphatic: Atherosclerotic changes identified along the iliac vessels. No separate nodal enlargement identified. Reproductive: Uterus measures 8.2 by 2.0 by 3.4 cm. Endometrial stripe is less than 3 mm. Slightly heterogeneous myometrium. Anterior to  the uterus is a large complex cystic and solid mass with heterogeneous enhancement of the solid component. Lesion measures 11.5 by 7.6 by 7.4 cm. The more solid component is right lateral inferior with a aggressive in enhancement measuring proximally 7.7 by 5.6 cm. Cephalocaudal length 8.5 cm. The cystic component more towards the left has a dimensions approaching 8.7 cm. There is surrounding free fluid and edema. Although this more in the central pelvis towards midline a right ovary is not seen as a separate structure. There is what appears to be a small postmenopausal of the left ovary measuring 15 mm. An ovarian neoplasm is a strong consideration. Other:  Small amount of free fluid in the pelvis.  Edema. Musculoskeletal: Curvature of the spine. Moderate degenerative changes of the lumbar spine with disc bulging and areas of stenosis greatest at L4-5. Degenerative changes of the pelvis and hips as well. Study is somewhat limited due to some artifacts postcontrast axial dataset as well as study being performed as a standard pelvis rather than a gynecologic pelvis exam. Please see separate dictation of abdomen MRI. IMPRESSION: Large complex cystic and solid pelvic mass centrally measuring up to 11.5 x 7.6 x 7.4 cm. Based on overall appearance this has worrisome for a neoplasm including an ovarian malignancy. Small amount of ascites in the pelvis. Soft tissue enhancing aggressive nodules along the course of the sigmoid colon as well as in the adjacent fat and presacral spaces. With the larger central mass this very well could be spread of disease to adjacent structures rather than a primary colonic process but correlate with symptoms and if needed colonoscopy. Please see separate dictation of abdominal MRI. Electronically Signed   By: Karen Kays M.D.   On: 10/31/2023 14:01   CT CHEST W CONTRAST  Result Date: 10/31/2023 CLINICAL DATA:  77 year old female with suspected gynecologic malignancy. * Tracking Code:  BO * EXAM: CT CHEST WITH CONTRAST TECHNIQUE: Multidetector CT imaging of the chest was performed during intravenous contrast administration. RADIATION DOSE REDUCTION: This exam was performed according to the departmental dose-optimization program which includes automated exposure control, adjustment of the mA and/or kV according to patient size and/or use of iterative reconstruction technique. CONTRAST:  75mL OMNIPAQUE IOHEXOL 350 MG/ML SOLN COMPARISON:  None Available. FINDINGS: Cardiovascular: The heart size is mildly enlarged. No pericardial effusion. Aortic atherosclerosis and coronary artery calcification. Mediastinum/Nodes: Trachea and esophagus appear unremarkable. The right lobe of thyroid gland appears surgically absent. No mediastinal or hilar adenopathy. Lungs/Pleura: No pleural effusion identified. Subsegmental atelectasis identified within the lingula and bilateral posterior lung bases. No signs of interstitial edema or airspace consolidation. No suspicious pulmonary nodule identified to suggest lung metastases. Upper Abdomen: No acute abnormality. Multiple liver cysts. Enlarged lymph node within the portal caval region measures 1.5 cm, image 155/3. Defer to report from CT [facility] dated 10/30/2019 for and same-day MRI of the abdomen pelvis for further details. Musculoskeletal: Mild curvature of the thoracic spine and lumbar spine is convex towards the left. Multilevel degenerative disc disease. No acute or suspicious osseous lesions. IMPRESSION: 1. No signs of metastatic disease to the chest. 2. Areas of subsegmental atelectasis noted within bilateral posterior lung bases and lingula. 3. Enlarged upper abdominal lymph node as above. In the setting of a known  malignancy this is concerning for nodal metastasis. 4. Coronary artery calcifications. 5.  Aortic Atherosclerosis (ICD10-I70.0). Electronically Signed   By: Signa Kell M.D.   On: 10/31/2023 05:57   CT ABDOMEN PELVIS W CONTRAST  Result Date:  10/30/2023 CLINICAL DATA:  Abdominal pain. EXAM: CT ABDOMEN AND PELVIS WITH CONTRAST TECHNIQUE: Multidetector CT imaging of the abdomen and pelvis was performed using the standard protocol following bolus administration of intravenous contrast. RADIATION DOSE REDUCTION: This exam was performed according to the departmental dose-optimization program which includes automated exposure control, adjustment of the mA and/or kV according to patient size and/or use of iterative reconstruction technique. CONTRAST:  75mL OMNIPAQUE IOHEXOL 350 MG/ML SOLN COMPARISON:  Limb 1424 FINDINGS: Lower chest: No acute findings. Hepatobiliary: Multiple hepatic cysts evident. Scattered tiny hypodensities in the liver parenchyma are too small to characterize but are statistically most likely benign. No followup imaging is recommended. Tiny subcapsular lesion measured previously at 9 mm in the anterior left liver is stable on image 23/3 today, nonspecific. There is no evidence for gallstones, gallbladder wall thickening, or pericholecystic fluid. No intrahepatic or extrahepatic biliary dilation. Pancreas: Dilatation of the pancreatic duct to the head and body of pancreas is similar to prior. Spleen: No splenomegaly. No suspicious focal mass lesion. Adrenals/Urinary Tract: No adrenal nodule or mass. Kidneys unremarkable. No evidence for hydroureter. Bladder is distended. Stomach/Bowel: Stomach is unremarkable. No gastric wall thickening. No evidence of outlet obstruction. Duodenum is normally positioned as is the ligament of Treitz. No small bowel wall thickening. No small bowel dilatation. Diverticular changes are noted in the left colon without evidence of diverticulitis. Vascular/Lymphatic: 16 mm short axis portal caval lymph node seen on 22/3. No para-aortic lymphadenopathy. No pelvic sidewall lymphadenopathy. Reproductive: Multiple uterine fibroids evident. As noted on prior study there is an area of the anterior cervix that appears to  obliterate the fat plane between the cervix and the posterior wall of the bladder (see sagittal 86/7). Small soft tissue nodules are again noted in the cul-de-sac some of which may pertain to diverticuli, but others raise concern for peritoneal nodularity (see images 61 and 56 of series 3). Other: No substantial free fluid. Musculoskeletal: No worrisome lytic or sclerotic osseous abnormality. IMPRESSION: 1. Multiple uterine fibroids. As noted on prior study there is an area of the anterior cervix that appears to obliterate the fat plane between the cervix and the posterior wall of the bladder. This is concerning for a cervical mass. Gynecologic consultation recommended. 2. Small soft tissue nodules in the cul-de-sac some of which may relate to diverticuli, but others raise concern for peritoneal nodularity. Attention on follow-up recommended. PET-CT may prove helpful to further evaluate 3. 16 mm short axis portal caval lymph node, metastatic disease not excluded. 4. Left colonic diverticulosis without diverticulitis. Electronically Signed   By: Kennith Center M.D.   On: 10/30/2023 13:08   CT ABDOMEN PELVIS W CONTRAST  Result Date: 10/26/2023 CLINICAL DATA:  Pt w/ abnormal [place]; mass in pelvis; no h/o cancer; no pain; no urinary issues EXAM: CT ABDOMEN AND PELVIS WITH CONTRAST TECHNIQUE: Multidetector CT imaging of the abdomen and pelvis was performed using the standard protocol following bolus administration of intravenous contrast. RADIATION DOSE REDUCTION: This exam was performed according to the departmental dose-optimization program which includes automated exposure control, adjustment of the mA and/or kV according to patient size and/or use of iterative reconstruction technique. CONTRAST:  ISOVUE-300 IOPAMIDOL (ISOVUE-300) INJECTION 61% COMPARISON:  Ultrasound pelvis 09/01/2023, ultrasound thyroid 10/10/2023 FINDINGS:  Lower chest: Mitral annular calcification. Aortic valve leaflet calcification. No  acute abnormality. Hepatobiliary: Multiple fluid density lesions scattered throughout the left right hepatic lobe. There an indeterminate 0.9 cm left hepatic lobe hypodensity with Hounsfield unit of 77 (2:29). No gallstones, gallbladder wall thickening, or pericholecystic fluid. No biliary dilatation. Pancreas: No focal lesion. Normal pancreatic contour. No surrounding inflammatory changes. No main pancreatic ductal dilatation. Spleen: Normal in size without focal abnormality. Adrenals/Urinary Tract: No adrenal nodule bilaterally. Bilateral kidneys enhance symmetrically. No hydronephrosis. No hydroureter. The urinary bladder is unremarkable. On delayed imaging, there is no urothelial wall thickening and there are no filling defects in the opacified portions of the bilateral collecting systems or ureters. Stomach/Bowel: Stomach is within normal limits. No evidence of small bowel wall thickening or dilatation. Increased stool burden proximal to the distal sigmoid colon mass with stool throughout the ascending, transverse, descending colon. Short segment of distal sigmoid colon irregular bowel wall thickening (5:26, 2:62). No large bowel luminal dilatation. Colonic diverticulosis appendix appears normal. Vascular/Lymphatic: No abdominal aorta or iliac aneurysm. 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 No abdominal, pelvic, or inguinal lymphadenopathy. Reproductive: There is a heterogeneous solid and cystic 11 x 9 cm mass arising from the uterine fundus. Finding is noted to invade into the urinary bladder dome (6:81, 5:56 close) where there is loss of intraperitoneal and lower ring of the urinary bladder wall margin. The mass is noted to abut and appears to be inseparable from a short segment of distal sigmoid colon in the region of irregular bowel wall thickening. Other: No intraperitoneal free fluid. No intraperitoneal free gas. No  organized fluid collection. Musculoskeletal: No abdominal wall hernia or abnormality. No suspicious lytic or blastic osseous lesions. No acute displaced fracture. Multilevel severe degenerative changes of the spine. Grade 1 anterolisthesis of L4 on L5 and L5 on S1. Mild retrolisthesis of L2 on L3 and L3 on L4. Dextroscoliosis centered at the L3-L4 level. IMPRESSION: 1. An 11 x 9 cm heterogeneous solid and cystic mass arises from the uterine fundus and is noted to invade the urinary bladder dome wall as well as the distal sigmoid colon. No associated bowel obstruction; however, constipation proximal to irregular bowel wall thickening/mass. No associated stercoral colitis. Finding consistent with malignancy. Recommend gynecologic consultation. When the patient is clinically stable and able to follow directions and hold their breath (preferably as an outpatient) further evaluation with dedicated MRI with and without contrast should be considered. 2. Indeterminate 0.9 cm left hepatic lobe hypodense lesion with a density of 77 HU. Question metastasis versus primary hepatic lesion. 3. Stool throughout the colon 4. Colonic diverticulosis with no acute diverticulitis. 5. 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 6.  Aortic Atherosclerosis (ICD10-I70.0)-severe. These results will be called to the ordering clinician or representative by the Radiologist Assistant, and communication documented in the PACS or [employer]. Electronically Signed   By: Tish Frederickson M.D.   On: 10/26/2023 18:42   DG BONE DENSITY (DXA)  Result Date: 10/26/2023 EXAM: DUAL X-RAY ABSORPTIOMETRY (DXA) FOR BONE MINERAL DENSITY IMPRESSION: Referring Physician:  Henrine Screws Your patient completed a bone mineral density test using GE Lunar iDXA system (analysis version: 16). Technologist: BEC PATIENT: Name: Lori Conrad, Lori Conrad Patient ID: [id number] Birth Date: 1946-08-01 Height: 60.5 in. Sex: Female Measured: 10/26/2023 Weight: 110.2 lbs.  Indications: Advanced Age, Caucasian, Estrogen Deficient, Height Loss (781.91), History of Osteoporosis, Levothyroxine, Postmenopausal Fractures: Left Ankle Treatments: Calcium (  E943.0), Vitamin D (E933.5) ASSESSMENT: The BMD measured at Forearm Radius 33% is 0.624 g/cm2 with a T-score of -2.9. This patient's diagnostic category is OSTEOPOROSIS according to World Health Organization [facility]) criteria. Comparison to 10/16/2019. Since the prior study, there has been a SIGNIFICANT DECREASE in bone mineral density of the hips (-4.6%). The lumbar spine was excluded due to being excluded from prior exam. The quality of the exam is good. Site Region Measured Date Measured Age YA BMD Significant CHANGE T-score Right Forearm Radius 33% 10/26/2023 77.0 -2.8 0.624 g/cm2 Right Forearm Radius 33% 10/16/2019 73.0 -2.6 0.653 g/cm2 DualFemur Neck Left 10/26/2023 77.0 -0.6 0.952 g/cm2 DualFemur Neck Left 10/16/2019 74.0 -0.5 0.962 g/cm2 DualFemur Total Mean 10/26/2023 78.0 -0.7 0.918 g/cm2 * DualFemur Total Mean 10/16/2019 74.0 -0.4 0.962 g/cm2 World Health Organization [facility]) criteria for post-menopausal, Caucasian Women: Normal       T-score at or above -1 SD Osteopenia   T-score between -1 and -2.5 SD Osteoporosis T-score at or below -2.5 SD RECOMMENDATION: 1. All patients should optimize calcium and vitamin D intake. 2. Consider FDA-approved medical therapies in postmenopausal women and men aged 64 years and older, based on the following: a. A hip or vertebral (clinical or morphometric) fracture. b. T-score = -2.5 at the femoral neck or spine after appropriate evaluation to exclude secondary causes. c. Low bone mass (T-score between -1.0 and -2.5 at the femoral neck or spine) and a 10-year probability of a hip fracture = 3% or a 10-year probability of a major osteoporosis-related fracture = 20% based on the [place]-adapted WHO algorithm. d. Clinician judgment and/or patient preferences may indicate treatment for people with 10-year  fracture probabilities above or below these levels. FOLLOW-UP: Patients with diagnosis of osteoporosis or at high risk for fracture should have regular bone mineral density tests.? Patients eligible for Medicare are allowed routine testing every 2 years.? The testing frequency can be increased to one year for patients who have rapidly progressing disease, are receiving or discontinuing medical therapy to restore bone mass, or have additional risk factors. I have reviewed this study and agree with the findings. [facility] Electronically Signed   By: Harmon Pier M.D.   On: 10/26/2023 12:30   US PELVIS LIMITED (TRANSABDOMINAL ONLY)  Result Date: 10/25/2023 CLINICAL DATA:  77 year old female with reported 3 cm mass between the umbilicus and the pubic symphysis. EXAM: LIMITED ULTRASOUND OF PELVIS TECHNIQUE: Limited transabdominal ultrasound examination of the pelvis was performed. COMPARISON:  None Available. FINDINGS: Limited transabdominal ultrasound of the pelvis was performed. Dedicated imaging of the uterus and ovaries was not included. The provided images are limited, although demonstrate an apparent solid mass extending into the lumen of the bladder on the right. It is unclear as to whether this reflects a primary bladder mass or an adjacent adnexal lesion. Regardless, findings are concerning for pelvic malignancy. Further evaluation strongly recommended. IMPRESSION: Limited transabdominal ultrasound of the pelvis demonstrates an apparent solid mass extending into the lumen of the bladder on the right. It is unclear as to whether this reflects a primary bladder mass or an adjacent adnexal lesion. Regardless, findings are concerning for pelvic malignancy. Further evaluation with CT of the abdomen and pelvis with contrast recommended. These results will be called to the ordering clinician or representative by the Radiologist Assistant, and communication documented in the PACS or [employer]. Electronically Signed   By: Carey Bullocks M.D.   On: 10/25/2023 09:24   US THYROID  Result Date: 10/21/2023  CLINICAL DATA:  Goiter. History of prior right hemithyroidectomy for benign disease. Subsequently, a left inferior thyroid nodule was biopsied in July of 2020. EXAM: THYROID ULTRASOUND TECHNIQUE: Ultrasound examination of the thyroid gland and adjacent soft tissues was performed. COMPARISON:  Prior thyroid ultrasound 06/10/2019; left inferior thyroid nodule biopsy 07/11/2019 FINDINGS: Parenchymal Echotexture: Moderately heterogenous Isthmus: Surgically absent Right lobe: Surgically absent Left lobe: 3.2 x 1.3 x 1.5 cm _________________________________________________________ Estimated total number of nodules >/= 1 cm: 2 Number of spongiform nodules >/=  2 cm not described below (TR1): 0 Number of mixed cystic and solid nodules >/= 1.5 cm not described below (TR2): 0 _________________________________________________________ The thyroid isthmus and right gland are surgically absent. The remaining left thyroid gland is diffusely heterogeneous and largely replaced by multiple solid thyroid nodules. The majority of the nodules are small and subcentimeter or complex cystic or spongiform in nature. Nodule # 4: The previously biopsied nodule in the deep aspect of the left lower gland measures 1.1 x 0.8 x 0.9 cm, slightly smaller than seen at the time of biopsy. No other individual thyroid nodule meets criteria for imaging surveillance. IMPRESSION: 1. Slight interval decrease in size of the previously biopsied nodule in the left lower gland consistent with benign findings. 2. Surgical changes of prior right hemithyroidectomy. 3. 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 None of the individual nodules meet criteria for further evaluation. The above is in keeping with the ACR TI-RADS recommendations - J Am Coll Radiol 2017;14:587-595. Electronically Signed   By: Malachy Moan M.D.   On: 10/21/2023 09:13   MM 3D SCREENING MAMMOGRAM BILATERAL BREAST  Result Date: 10/13/2023 CLINICAL DATA:  Screening. EXAM: DIGITAL SCREENING BILATERAL MAMMOGRAM WITH TOMOSYNTHESIS AND CAD TECHNIQUE: Bilateral screening digital craniocaudal and mediolateral oblique mammograms were obtained. Bilateral screening digital breast tomosynthesis was performed. The images were evaluated with computer-aided detection. COMPARISON:  Previous exam(s). ACR Breast Density Category d: The breasts are extremely dense, which lowers the sensitivity of mammography. FINDINGS: There are no findings suspicious for malignancy. IMPRESSION: No mammographic evidence of malignancy. A result letter of this screening mammogram will be mailed directly to the patient. RECOMMENDATION: Screening mammogram in one year. (Code:SM-B-01Y) BI-RADS CATEGORY  1: Negative. Electronically Signed   By: Ted Mcalpine M.D.   On: 10/13/2023 13:17

## 2023-11-01 ENCOUNTER — Inpatient Hospital Stay (HOSPITAL_COMMUNITY): Payer: Medicare HMO

## 2023-11-01 DIAGNOSIS — R1900 Intra-abdominal and pelvic swelling, mass and lump, unspecified site: Secondary | ICD-10-CM | POA: Diagnosis not present

## 2023-11-01 HISTORY — PX: IR IMAGING GUIDED PORT INSERTION: IMG5740

## 2023-11-01 LAB — CBC WITH DIFFERENTIAL/PLATELET
Abs Immature Granulocytes: 0.03 10*3/uL (ref 0.00–0.07)
Basophils Absolute: 0 10*3/uL (ref 0.0–0.1)
Basophils Relative: 0 %
Eosinophils Absolute: 0.1 10*3/uL (ref 0.0–0.5)
Eosinophils Relative: 2 %
HCT: 43.9 % (ref 36.0–46.0)
Hemoglobin: 14.2 g/dL (ref 12.0–15.0)
Immature Granulocytes: 0 %
Lymphocytes Relative: 17 %
Lymphs Abs: 1.1 10*3/uL (ref 0.7–4.0)
MCH: 29.1 pg (ref 26.0–34.0)
MCHC: 32.3 g/dL (ref 30.0–36.0)
MCV: 90 fL (ref 80.0–100.0)
Monocytes Absolute: 0.7 10*3/uL (ref 0.1–1.0)
Monocytes Relative: 10 %
Neutro Abs: 4.8 10*3/uL (ref 1.7–7.7)
Neutrophils Relative %: 71 %
Platelets: 163 10*3/uL (ref 150–400)
RBC: 4.88 MIL/uL (ref 3.87–5.11)
RDW: 13.5 % (ref 11.5–15.5)
WBC: 6.7 10*3/uL (ref 4.0–10.5)
nRBC: 0 % (ref 0.0–0.2)

## 2023-11-01 LAB — MAGNESIUM: Magnesium: 2 mg/dL (ref 1.7–2.4)

## 2023-11-01 LAB — CANCER ANTIGEN 19-9: CA 19-9: 11 U/mL (ref 0–35)

## 2023-11-01 LAB — BASIC METABOLIC PANEL
Anion gap: 7 (ref 5–15)
BUN: 12 mg/dL (ref 8–23)
CO2: 29 mmol/L (ref 22–32)
Calcium: 8.9 mg/dL (ref 8.9–10.3)
Chloride: 102 mmol/L (ref 98–111)
Creatinine, Ser: 0.62 mg/dL (ref 0.44–1.00)
GFR, Estimated: >60 mL/min (ref >60)
Glucose, Bld: 104 mg/dL — ABNORMAL HIGH (ref 70–99)
Potassium: 3.8 mmol/L (ref 3.5–5.1)
Sodium: 138 mmol/L (ref 135–145)

## 2023-11-01 LAB — SURGICAL PATHOLOGY

## 2023-11-01 LAB — PROTIME-INR
INR: 1.1 (ref 0.8–1.2)
Prothrombin Time: 14.3 s (ref 11.4–15.2)

## 2023-11-01 LAB — CEA: CEA: 3.5 ng/mL (ref 0.0–4.7)

## 2023-11-01 LAB — CA 125: Cancer Antigen (CA) 125: 401 U/mL — ABNORMAL HIGH (ref 0.0–38.1)

## 2023-11-01 MED ORDER — LIDOCAINE-EPINEPHRINE 1 %-1:100000 IJ SOLN
INTRAMUSCULAR | Status: AC
  Filled 2023-11-01: qty 1

## 2023-11-01 MED ORDER — HEPARIN SOD (PORK) LOCK FLUSH 100 UNIT/ML IV SOLN
INTRAVENOUS | Status: AC
  Filled 2023-11-01: qty 5

## 2023-11-01 MED ORDER — SODIUM CHLORIDE 0.9 % IV SOLN: INTRAVENOUS | Status: DC

## 2023-11-01 MED ORDER — MIDAZOLAM HCL 2 MG/2ML IJ SOLN
INTRAMUSCULAR | Status: AC | PRN
  Administered 2023-11-01: 1 mg via INTRAVENOUS

## 2023-11-01 MED ORDER — FENTANYL CITRATE (PF) 100 MCG/2ML IJ SOLN
INTRAMUSCULAR | Status: AC | PRN
  Administered 2023-11-01: 50 ug via INTRAVENOUS

## 2023-11-01 MED ORDER — FENTANYL CITRATE (PF) 100 MCG/2ML IJ SOLN
INTRAMUSCULAR | Status: AC
  Filled 2023-11-01: qty 4

## 2023-11-01 MED ORDER — LIDOCAINE-EPINEPHRINE 1 %-1:100000 IJ SOLN
20.0000 mL | Freq: Once | INTRAMUSCULAR | Status: AC
  Administered 2023-11-01: 20 mL via INTRADERMAL
  Filled 2023-11-01: qty 20

## 2023-11-01 MED ORDER — CHLORHEXIDINE GLUCONATE CLOTH 2 % EX PADS
6.0000 | MEDICATED_PAD | Freq: Every day | CUTANEOUS | Status: DC
  Administered 2023-11-02 – 2023-11-03 (×2): 6 via TOPICAL

## 2023-11-01 MED ORDER — MIDAZOLAM HCL 2 MG/2ML IJ SOLN
INTRAMUSCULAR | Status: AC
  Filled 2023-11-01: qty 4

## 2023-11-01 MED ORDER — PEG 3350-KCL-NA BICARB-NACL 420 G PO SOLR
4000.0000 mL | Freq: Once | ORAL | Status: AC
  Administered 2023-11-01: 4000 mL via ORAL
  Filled 2023-11-01: qty 4000

## 2023-11-01 NOTE — Consult Note (Signed)
Chief Complaint: Patient was seen in consultation today for [facility] a cath placement Chief Complaint  Patient presents with   Abdominal Pain   at the request of Dr Artis Delay  Supervising Physician: Marliss Coots  Patient Status: [facility] - In-pt  History of Present Illness: Lori Conrad is a 77 y.o. female   FULL Code status per pt Large pelvic mass- suspect primary GYN malignancy Denies bleeding Family Hx cancer Dtr died of Panc Ca age 70 Endometrial biopsy performed yesterday-- pending result  MR yesterday:  IMPRESSION: Large complex cystic and solid pelvic mass centrally measuring up to 11.5 x 7.6 x 7.4 cm. Based on overall appearance this has worrisome for a neoplasm including an ovarian malignancy. Small amount of ascites in the pelvis. Soft tissue enhancing aggressive nodules along the course of the sigmoid colon as well as in the adjacent fat and presacral spaces. With the larger central mass this very well could be spread of disease to adjacent structures rather than a primary colonic process but correlate with symptoms and if needed colonoscopy. IMPRESSION: Multiple benign-appearing liver lesions including cysts and 1 hemangioma. Persistent enlarged upper abdominal retroperitoneal lymph node. Additional smaller but prominent nodes as well. With the pelvic findings these are worrisome for potential spread of neoplasm.  Dr Bertis Ruddy asking for [facility] placement    Past Medical History:  Diagnosis Date   Hyperlipidemia    Hypothyroidism    Macular degeneration     Past Surgical History:  Procedure Laterality Date   ANKLE FRACTURE SURGERY Left    BROW LIFT Bilateral 05/17/2022   Procedure: BILATERAL UPPER BLEPHAROPLASTY WITH PTOSIS REPAIR;  Surgeon: Janne Napoleon, MD;  Location: [facility] OR;  Service: Plastics;  Laterality: Bilateral;  1.5 hours   BUNIONECTOMY Left    EYE SURGERY     REFRACTIVE SURGERY     laser for macular degeneration   SHOULDER ARTHROSCOPY      TONSILLECTOMY      Allergies: Patient has no known allergies.  Medications: Prior to Admission medications   Medication Sig Start Date End Date Taking? Authorizing Provider  atorvastatin (LIPITOR) 40 MG tablet Take 40 mg by mouth at bedtime.   Yes [provider]  Calcium Carb-Cholecalciferol (SUPER CALCIUM 600 + D 400 PO) Take 1 tablet by mouth daily.   Yes [provider]  Cholecalciferol (VITAMIN D) 50 MCG (2000 UT) tablet Take 2,000 Units by mouth daily.   Yes [provider]  Coenzyme Q10 (COQ10) 400 MG CAPS Take 400 mg by mouth daily.   Yes [provider]  doxylamine, Sleep, (UNISOM) 25 MG tablet Take 25 mg by mouth at bedtime as needed for sleep.   Yes [provider]  Glucosamine-Chondroit-Vit C-Mn (GLUCOSAMINE 1500 COMPLEX PO) Take 1 tablet by mouth daily.   Yes [provider]  levothyroxine (SYNTHROID) 75 MCG tablet Take 75 mcg by mouth daily before breakfast.   Yes [provider]  Magnesium 250 MG TABS Take 250 mg by mouth daily.   Yes [provider]  meloxicam (MOBIC) 7.5 MG tablet Take 7.5 mg by mouth daily. 07/28/21  Yes [provider]  Multiple Vitamins-Minerals (PRESERVISION AREDS 2+MULTI VIT PO) Take 1 tablet by mouth 2 (two) times daily. 11/12/19  Yes [provider]  Omega-3 Fatty Acids (OMEGA 3 500) 500 MG CAPS Take 500 mg by mouth daily.   Yes [provider]  COMIRNATY syringe  09/08/22   [provider]     Family History  Problem Relation Age of Onset   Lung cancer Mother    Stroke Mother 29   Alzheimer's disease Father    CAD Father        95% occlusion of L Main & RCA, severe descending aorta, arterial & arteriolonephrosclerosis    Cholecystitis Father    Esophagitis Father    Pancreatic cancer Daughter    BRCA 1/2 Neg Hx    Breast cancer Neg Hx    Colon cancer Neg Hx    Ovarian cancer Neg Hx    Endometrial cancer Neg Hx    Prostate cancer  Neg Hx     Social History   Socioeconomic History   Marital status: Widowed    Spouse name: Not on file   Number of children: 2   Years of education: Not on file   Highest education level: Not on file  Occupational History   Not on file  Tobacco Use   Smoking status: Former    Current packs/day: 0.00    Types: Cigarettes    Quit date: 05/15/1988    Years since quitting: 35.4   Smokeless tobacco: Never  Vaping Use   Vaping status: Never Used  Substance and Sexual Activity   Alcohol use: Yes    Alcohol/week: 14.0 standard drinks of alcohol    Types: 14 drink(s) per week    Comment: white wine   Drug use: No   Sexual activity: Not Currently    Partners: Male  Other Topics Concern   Not on file  Social History Narrative   Not on file   Social Determinants of Health   Financial Resource Strain: Not on file  Food Insecurity: No Food Insecurity (10/30/2023)   Hunger Vital Sign    Worried About Running Out of Food in the Last Year: Never true    Ran Out of Food in the Last Year: Never true  Transportation Needs: No Transportation Needs (10/30/2023)   PRAPARE - Administrator, Civil Service (Medical): No    Lack of Transportation (Non-Medical): No  Physical Activity: Not on file  Stress: Not on file  Social Connections: Not on file    Review of Systems: A 12 point ROS discussed and pertinent positives are indicated in the HPI above.  All other systems are negative.  Review of Systems  Constitutional:  Positive for activity change. Negative for fatigue.  Respiratory:  Negative for cough and shortness of breath.   Cardiovascular:  Negative for chest pain.  Gastrointestinal:  Positive for abdominal pain.  Psychiatric/Behavioral:  Negative for behavioral problems and confusion.     Vital Signs: BP (!) 152/98 (BP Location: Right Arm)   Pulse 61   Temp 98.6 F (37 C) (Oral)   Resp 16   Ht 5\' 1"  (1.549 m)   Wt 109 lb (49.4 kg)   SpO2 95%   BMI 20.60  kg/m   Advance Care Plan: The advanced care plan/surrogate decision maker was discussed at the time of visit and documented in the medical record.    Physical Exam Vitals reviewed.  HENT:     Mouth/Throat:     Mouth: Mucous membranes are moist.  Cardiovascular:     Rate and Rhythm: Normal rate and regular rhythm.  Pulmonary:     Effort: Pulmonary effort is normal.     Breath sounds: Normal breath sounds.  Abdominal:     Tenderness: There is abdominal tenderness.  Skin:    General: Skin is warm.  Neurological:  Mental Status: She is oriented to person, place, and time.  Psychiatric:        Behavior: Behavior normal.     Imaging: MR ABDOMEN W WO CONTRAST  Result Date: 10/31/2023 CLINICAL DATA:  Pelvic mass.  Liver lesion EXAM: MRI ABDOMEN WITHOUT AND WITH CONTRAST TECHNIQUE: Multiplanar multisequence MR imaging of the abdomen was performed both before and after the administration of intravenous contrast. CONTRAST:  5mL GADAVIST GADOBUTROL 1 MMOL/ML IV SOLN COMPARISON:  CT 10/30/2023 and older. FINDINGS: Lower chest: Trace pleural fluid. Hepatobiliary: Numerous bright T2, low T1 nonenhancing foci identified consistent with benign cystic lesions. Many of these are under 15 mm. There are some larger foci identified such as segment 4 measuring 4.3 cm and caudate measuring 2.8 cm. Prior CT did demonstrate 1 lesion which is more complex anteriorly in the left hepatic lobe, segment 3 which on today's examination when taking into account motion show some progressive enhancement, is bright on T2 but not as bright as simple fluid and consistent with a small hemangioma. No specific aggressive liver lesion clearly identified today. Patent portal vein. Gallbladder is nondilated. No biliary ductal dilatation. Pancreas: Ectatic pancreatic duct diffusely measuring up to 6 mm, severe. No pancreatic focal atrophy, abnormal enhancement or abnormal T1 signal. No restricted diffusion along the pancreas.  Spleen:  Within normal limits in size and appearance. Adrenals/Urinary Tract: Adrenal glands are preserved. No enhancing renal mass or collecting system dilatation. Stomach/Bowel: Visualized bowel is nondilated. This includes visualized portions of the small and large bowel. The stomach is underdistended. Vascular/Lymphatic: Normal caliber aorta and IVC. Atherosclerotic changes along the aorta. Circumaortic left renal vein. Once again there is a abnormal lymph node identified anterior to the aorta in the upper abdomen on series 1602, image 58 measuring 2.3 x 1.8 cm. Few other small retroperitoneal nodes identified. Other:  Trace ascites.  Mesenteric stranding. Musculoskeletal: Curvature and degenerative changes along the spine. IMPRESSION: Multiple benign-appearing liver lesions including cysts and 1 hemangioma. Persistent enlarged upper abdominal retroperitoneal lymph node. Additional smaller but prominent nodes as well. With the pelvic findings these are worrisome for potential spread of neoplasm. Mild ascites. Electronically Signed   By: Karen Kays M.D.   On: 10/31/2023 14:10   MR PELVIS W WO CONTRAST  Result Date: 10/31/2023 CLINICAL DATA:  Pelvic mass EXAM: MRI PELVIS WITHOUT AND WITH CONTRAST TECHNIQUE: Multiplanar multisequence MR imaging of the pelvis was performed both before and after administration of intravenous contrast. CONTRAST:  5mL GADAVIST GADOBUTROL 1 MMOL/ML IV SOLN COMPARISON:  CT 10/30/2019 FINDINGS: Urinary Tract: Bladder is mildly distended with fluid. There is some mass effect along the posterior aspect of the bladder related to the adjacent complex mass. The bladder itself appears grossly intact. No abnormal wall enhancement. Grossly preserved course of the urethra. Bowel: The visualized bowel in the pelvis is nondilated. However there is lobular masslike area along the distal sigmoid colon with the areas of heterogeneous enhancement. The dominant lesion in this location on series 4,  image 29 measures 3.7 by 2.6 cm. There are several adjacent soft tissue nodules as well such as just posterior to the cervix and anterior to the bowel measuring 10 mm on series 22 image 30. Focus superior left lateral series 22, image 26 measures 2.3 x 1.7 cm. Additional foci elsewhere dependently in the pelvis including the presacral space. Vascular/Lymphatic: Atherosclerotic changes identified along the iliac vessels. No separate nodal enlargement identified. Reproductive: Uterus measures 8.2 by 2.0 by 3.4 cm. Endometrial stripe is less  than 3 mm. Slightly heterogeneous myometrium. Anterior to the uterus is a large complex cystic and solid mass with heterogeneous enhancement of the solid component. Lesion measures 11.5 by 7.6 by 7.4 cm. The more solid component is right lateral inferior with a aggressive in enhancement measuring proximally 7.7 by 5.6 cm. Cephalocaudal length 8.5 cm. The cystic component more towards the left has a dimensions approaching 8.7 cm. There is surrounding free fluid and edema. Although this more in the central pelvis towards midline a right ovary is not seen as a separate structure. There is what appears to be a small postmenopausal of the left ovary measuring 15 mm. An ovarian neoplasm is a strong consideration. Other:  Small amount of free fluid in the pelvis.  Edema. Musculoskeletal: Curvature of the spine. Moderate degenerative changes of the lumbar spine with disc bulging and areas of stenosis greatest at L4-5. Degenerative changes of the pelvis and hips as well. Study is somewhat limited due to some artifacts postcontrast axial dataset as well as study being performed as a standard pelvis rather than a gynecologic pelvis exam. Please see separate dictation of abdomen MRI. IMPRESSION: Large complex cystic and solid pelvic mass centrally measuring up to 11.5 x 7.6 x 7.4 cm. Based on overall appearance this has worrisome for a neoplasm including an ovarian malignancy. Small amount of  ascites in the pelvis. Soft tissue enhancing aggressive nodules along the course of the sigmoid colon as well as in the adjacent fat and presacral spaces. With the larger central mass this very well could be spread of disease to adjacent structures rather than a primary colonic process but correlate with symptoms and if needed colonoscopy. Please see separate dictation of abdominal MRI. Electronically Signed   By: Karen Kays M.D.   On: 10/31/2023 14:01   CT CHEST W CONTRAST  Result Date: 10/31/2023 CLINICAL DATA:  77 year old female with suspected gynecologic malignancy. * Tracking Code: BO * EXAM: CT CHEST WITH CONTRAST TECHNIQUE: Multidetector CT imaging of the chest was performed during intravenous contrast administration. RADIATION DOSE REDUCTION: This exam was performed according to the departmental dose-optimization program which includes automated exposure control, adjustment of the mA and/or kV according to patient size and/or use of iterative reconstruction technique. CONTRAST:  75mL OMNIPAQUE IOHEXOL 350 MG/ML SOLN COMPARISON:  None Available. FINDINGS: Cardiovascular: The heart size is mildly enlarged. No pericardial effusion. Aortic atherosclerosis and coronary artery calcification. Mediastinum/Nodes: Trachea and esophagus appear unremarkable. The right lobe of thyroid gland appears surgically absent. No mediastinal or hilar adenopathy. Lungs/Pleura: No pleural effusion identified. Subsegmental atelectasis identified within the lingula and bilateral posterior lung bases. No signs of interstitial edema or airspace consolidation. No suspicious pulmonary nodule identified to suggest lung metastases. Upper Abdomen: No acute abnormality. Multiple liver cysts. Enlarged lymph node within the portal caval region measures 1.5 cm, image 155/3. Defer to report from CT AP dated 10/30/2019 for and same-day MRI of the abdomen pelvis for further details. Musculoskeletal: Mild curvature of the thoracic spine and  lumbar spine is convex towards the left. Multilevel degenerative disc disease. No acute or suspicious osseous lesions. IMPRESSION: 1. No signs of metastatic disease to the chest. 2. Areas of subsegmental atelectasis noted within bilateral posterior lung bases and lingula. 3. Enlarged upper abdominal lymph node as above. In the setting of a known malignancy this is concerning for nodal metastasis. 4. Coronary artery calcifications. 5.  Aortic Atherosclerosis (ICD10-I70.0). Electronically Signed   By: Signa Kell M.D.   On: 10/31/2023 05:57  CT ABDOMEN PELVIS W CONTRAST  Result Date: 10/30/2023 CLINICAL DATA:  Abdominal pain. EXAM: CT ABDOMEN AND PELVIS WITH CONTRAST TECHNIQUE: Multidetector CT imaging of the abdomen and pelvis was performed using the standard protocol following bolus administration of intravenous contrast. RADIATION DOSE REDUCTION: This exam was performed according to the departmental dose-optimization program which includes automated exposure control, adjustment of the mA and/or kV according to patient size and/or use of iterative reconstruction technique. CONTRAST:  75mL OMNIPAQUE IOHEXOL 350 MG/ML SOLN COMPARISON:  Limb 1424 FINDINGS: Lower chest: No acute findings. Hepatobiliary: Multiple hepatic cysts evident. Scattered tiny hypodensities in the liver parenchyma are too small to characterize but are statistically most likely benign. No followup imaging is recommended. Tiny subcapsular lesion measured previously at 9 mm in the anterior left liver is stable on image 23/3 today, nonspecific. There is no evidence for gallstones, gallbladder wall thickening, or pericholecystic fluid. No intrahepatic or extrahepatic biliary dilation. Pancreas: Dilatation of the pancreatic duct to the head and body of pancreas is similar to prior. Spleen: No splenomegaly. No suspicious focal mass lesion. Adrenals/Urinary Tract: No adrenal nodule or mass. Kidneys unremarkable. No evidence for hydroureter.  Bladder is distended. Stomach/Bowel: Stomach is unremarkable. No gastric wall thickening. No evidence of outlet obstruction. Duodenum is normally positioned as is the ligament of Treitz. No small bowel wall thickening. No small bowel dilatation. Diverticular changes are noted in the left colon without evidence of diverticulitis. Vascular/Lymphatic: 16 mm short axis portal caval lymph node seen on 22/3. No para-aortic lymphadenopathy. No pelvic sidewall lymphadenopathy. Reproductive: Multiple uterine fibroids evident. As noted on prior study there is an area of the anterior cervix that appears to obliterate the fat plane between the cervix and the posterior wall of the bladder (see sagittal 86/7). Small soft tissue nodules are again noted in the cul-de-sac some of which may pertain to diverticuli, but others raise concern for peritoneal nodularity (see images 61 and 56 of series 3). Other: No substantial free fluid. Musculoskeletal: No worrisome lytic or sclerotic osseous abnormality. IMPRESSION: 1. Multiple uterine fibroids. As noted on prior study there is an area of the anterior cervix that appears to obliterate the fat plane between the cervix and the posterior wall of the bladder. This is concerning for a cervical mass. Gynecologic consultation recommended. 2. Small soft tissue nodules in the cul-de-sac some of which may relate to diverticuli, but others raise concern for peritoneal nodularity. Attention on follow-up recommended. PET-CT may prove helpful to further evaluate 3. 16 mm short axis portal caval lymph node, metastatic disease not excluded. 4. Left colonic diverticulosis without diverticulitis. Electronically Signed   By: Kennith Center M.D.   On: 10/30/2023 13:08   CT ABDOMEN PELVIS W CONTRAST  Result Date: 10/26/2023 CLINICAL DATA:  Pt w/ abnormal [place]; mass in pelvis; no h/o cancer; no pain; no urinary issues EXAM: CT ABDOMEN AND PELVIS WITH CONTRAST TECHNIQUE: Multidetector CT imaging of the  abdomen and pelvis was performed using the standard protocol following bolus administration of intravenous contrast. RADIATION DOSE REDUCTION: This exam was performed according to the departmental dose-optimization program which includes automated exposure control, adjustment of the mA and/or kV according to patient size and/or use of iterative reconstruction technique. CONTRAST:  ISOVUE-300 IOPAMIDOL (ISOVUE-300) INJECTION 61% COMPARISON:  Ultrasound pelvis 09/01/2023, ultrasound thyroid 10/10/2023 FINDINGS: Lower chest: Mitral annular calcification. Aortic valve leaflet calcification. No acute abnormality. Hepatobiliary: Multiple fluid density lesions scattered throughout the left right hepatic lobe. There an indeterminate 0.9 cm left hepatic lobe  hypodensity with Hounsfield unit of 77 (2:29). No gallstones, gallbladder wall thickening, or pericholecystic fluid. No biliary dilatation. Pancreas: No focal lesion. Normal pancreatic contour. No surrounding inflammatory changes. No main pancreatic ductal dilatation. Spleen: Normal in size without focal abnormality. Adrenals/Urinary Tract: No adrenal nodule bilaterally. Bilateral kidneys enhance symmetrically. No hydronephrosis. No hydroureter. The urinary bladder is unremarkable. On delayed imaging, there is no urothelial wall thickening and there are no filling defects in the opacified portions of the bilateral collecting systems or ureters. Stomach/Bowel: Stomach is within normal limits. No evidence of small bowel wall thickening or dilatation. Increased stool burden proximal to the distal sigmoid colon mass with stool throughout the ascending, transverse, descending colon. Short segment of distal sigmoid colon irregular bowel wall thickening (5:26, 2:62). No large bowel luminal dilatation. Colonic diverticulosis appendix appears normal. Vascular/Lymphatic: No abdominal aorta or iliac aneurysm. Severe atherosclerotic plaque of the aorta and its branches with  severe narrowing of the proximal celiac artery (6:80). No abdominal, pelvic, or inguinal lymphadenopathy. Reproductive: There is a heterogeneous solid and cystic 11 x 9 cm mass arising from the uterine fundus. Finding is noted to invade into the urinary bladder dome (6:81, 5:56 close) where there is loss of intraperitoneal and lower ring of the urinary bladder wall margin. The mass is noted to abut and appears to be inseparable from a short segment of distal sigmoid colon in the region of irregular bowel wall thickening. Other: No intraperitoneal free fluid. No intraperitoneal free gas. No organized fluid collection. Musculoskeletal: No abdominal wall hernia or abnormality. No suspicious lytic or blastic osseous lesions. No acute displaced fracture. Multilevel severe degenerative changes of the spine. Grade 1 anterolisthesis of L4 on L5 and L5 on S1. Mild retrolisthesis of L2 on L3 and L3 on L4. Dextroscoliosis centered at the L3-L4 level. IMPRESSION: 1. An 11 x 9 cm heterogeneous solid and cystic mass arises from the uterine fundus and is noted to invade the urinary bladder dome wall as well as the distal sigmoid colon. No associated bowel obstruction; however, constipation proximal to irregular bowel wall thickening/mass. No associated stercoral colitis. Finding consistent with malignancy. Recommend gynecologic consultation. When the patient is clinically stable and able to follow directions and hold their breath (preferably as an outpatient) further evaluation with dedicated MRI with and without contrast should be considered. 2. Indeterminate 0.9 cm left hepatic lobe hypodense lesion with a density of 77 HU. Question metastasis versus primary hepatic lesion. 3. Stool throughout the colon 4. Colonic diverticulosis with no acute diverticulitis. 5. 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 6.  Aortic Atherosclerosis (ICD10-I70.0)-severe. These results will be called to the ordering clinician or representative  by the Radiologist Assistant, and communication documented in the PACS or [employer]. Electronically Signed   By: Tish Frederickson M.D.   On: 10/26/2023 18:42   DG BONE DENSITY (DXA)  Result Date: 10/26/2023 EXAM: DUAL X-RAY ABSORPTIOMETRY (DXA) FOR BONE MINERAL DENSITY IMPRESSION: Referring Physician:  Henrine Screws Your patient completed a bone mineral density test using GE Lunar iDXA system (analysis version: 16). Technologist: BEC PATIENT: Name: Loza, Bonder Patient ID: [id number] Birth Date: 1946/03/22 Height: 60.5 in. Sex: Female Measured: 10/26/2023 Weight: 110.2 lbs. Indications: Advanced Age, Caucasian, Estrogen Deficient, Height Loss (781.91), History of Osteoporosis, Levothyroxine, Postmenopausal Fractures: Left Ankle Treatments: Calcium (E943.0), Vitamin D (E933.5) ASSESSMENT: The BMD measured at Forearm Radius 33% is 0.624 g/cm2 with a T-score of -2.9. This patient's diagnostic category is OSTEOPOROSIS according to World Health Organization [facility]) criteria.  Comparison to 10/16/2019. Since the prior study, there has been a SIGNIFICANT DECREASE in bone mineral density of the hips (-4.6%). The lumbar spine was excluded due to being excluded from prior exam. The quality of the exam is good. Site Region Measured Date Measured Age YA BMD Significant CHANGE T-score Right Forearm Radius 33% 10/26/2023 78.0 -2.9 0.624 g/cm2 Right Forearm Radius 33% 10/16/2019 74.0 -2.6 0.653 g/cm2 DualFemur Neck Left 10/26/2023 78.0 -0.6 0.952 g/cm2 DualFemur Neck Left 10/16/2019 74.0 -0.5 0.962 g/cm2 DualFemur Total Mean 10/26/2023 78.0 -0.7 0.918 g/cm2 * DualFemur Total Mean 10/16/2019 74.0 -0.4 0.962 g/cm2 World Health Organization [facility]) criteria for post-menopausal, Caucasian Women: Normal       T-score at or above -1 SD Osteopenia   T-score between -1 and -2.5 SD Osteoporosis T-score at or below -2.5 SD RECOMMENDATION: 1. All patients should optimize calcium and vitamin D intake. 2. Consider FDA-approved  medical therapies in postmenopausal women and men aged 41 years and older, based on the following: a. A hip or vertebral (clinical or morphometric) fracture. b. T-score = -2.5 at the femoral neck or spine after appropriate evaluation to exclude secondary causes. c. Low bone mass (T-score between -1.0 and -2.5 at the femoral neck or spine) and a 10-year probability of a hip fracture = 3% or a 10-year probability of a major osteoporosis-related fracture = 20% based on the [place]-adapted WHO algorithm. d. Clinician judgment and/or patient preferences may indicate treatment for people with 10-year fracture probabilities above or below these levels. FOLLOW-UP: Patients with diagnosis of osteoporosis or at high risk for fracture should have regular bone mineral density tests.? Patients eligible for Medicare are allowed routine testing every 2 years.? The testing frequency can be increased to one year for patients who have rapidly progressing disease, are receiving or discontinuing medical therapy to restore bone mass, or have additional risk factors. I have reviewed this study and agree with the findings. [facility] Electronically Signed   By: Harmon Pier M.D.   On: 10/26/2023 12:30   US PELVIS LIMITED (TRANSABDOMINAL ONLY)  Result Date: 10/25/2023 CLINICAL DATA:  77 year old female with reported 3 cm mass between the umbilicus and the pubic symphysis. EXAM: LIMITED ULTRASOUND OF PELVIS TECHNIQUE: Limited transabdominal ultrasound examination of the pelvis was performed. COMPARISON:  None Available. FINDINGS: Limited transabdominal ultrasound of the pelvis was performed. Dedicated imaging of the uterus and ovaries was not included. The provided images are limited, although demonstrate an apparent solid mass extending into the lumen of the bladder on the right. It is unclear as to whether this reflects a primary bladder mass or an adjacent adnexal lesion. Regardless, findings are concerning for pelvic  malignancy. Further evaluation strongly recommended. IMPRESSION: Limited transabdominal ultrasound of the pelvis demonstrates an apparent solid mass extending into the lumen of the bladder on the right. It is unclear as to whether this reflects a primary bladder mass or an adjacent adnexal lesion. Regardless, findings are concerning for pelvic malignancy. Further evaluation with CT of the abdomen and pelvis with contrast recommended. These results will be called to the ordering clinician or representative by the Radiologist Assistant, and communication documented in the PACS or [employer]. Electronically Signed   By: Carey Bullocks M.D.   On: 10/25/2023 09:24   US THYROID  Result Date: 10/21/2023 CLINICAL DATA:  Goiter. History of prior right hemithyroidectomy for benign disease. Subsequently, a left inferior thyroid nodule was biopsied in July of 2020. EXAM: THYROID ULTRASOUND TECHNIQUE: Ultrasound examination of the thyroid  gland and adjacent soft tissues was performed. COMPARISON:  Prior thyroid ultrasound 06/10/2019; left inferior thyroid nodule biopsy 07/11/2019 FINDINGS: Parenchymal Echotexture: Moderately heterogenous Isthmus: Surgically absent Right lobe: Surgically absent Left lobe: 3.2 x 1.3 x 1.5 cm _________________________________________________________ Estimated total number of nodules >/= 1 cm: 2 Number of spongiform nodules >/=  2 cm not described below (TR1): 0 Number of mixed cystic and solid nodules >/= 1.5 cm not described below (TR2): 0 _________________________________________________________ The thyroid isthmus and right gland are surgically absent. The remaining left thyroid gland is diffusely heterogeneous and largely replaced by multiple solid thyroid nodules. The majority of the nodules are small and subcentimeter or complex cystic or spongiform in nature. Nodule # 4: The previously biopsied nodule in the deep aspect of the left lower gland measures 1.1 x 0.8 x 0.9 cm, slightly  smaller than seen at the time of biopsy. No other individual thyroid nodule meets criteria for imaging surveillance. IMPRESSION: 1. Slight interval decrease in size of the previously biopsied nodule in the left lower gland consistent with benign findings. 2. Surgical changes of prior right hemithyroidectomy. 3. 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 None of the individual nodules meet criteria for further evaluation. The above is in keeping with the ACR TI-RADS recommendations - J Am Coll Radiol 2017;14:587-595. Electronically Signed   By: Malachy Moan M.D.   On: 10/21/2023 09:13   MM 3D SCREENING MAMMOGRAM BILATERAL BREAST  Result Date: 10/13/2023 CLINICAL DATA:  Screening. EXAM: DIGITAL SCREENING BILATERAL MAMMOGRAM WITH TOMOSYNTHESIS AND CAD TECHNIQUE: Bilateral screening digital craniocaudal and mediolateral oblique mammograms were obtained. Bilateral screening digital breast tomosynthesis was performed. The images were evaluated with computer-aided detection. COMPARISON:  Previous exam(s). ACR Breast Density Category d: The breasts are extremely dense, which lowers the sensitivity of mammography. FINDINGS: There are no findings suspicious for malignancy. IMPRESSION: No mammographic evidence of malignancy. A result letter of this screening mammogram will be mailed directly to the patient. RECOMMENDATION: Screening mammogram in one year. (Code:SM-B-01Y) BI-RADS CATEGORY  1: Negative. Electronically Signed   By: Ted Mcalpine M.D.   On: 10/13/2023 13:17    Labs:  CBC: Recent Labs    10/30/23 0739 10/31/23 0740 11/01/23 0527  WBC 11.3* 9.6 6.7  HGB 14.9 14.5 14.2  HCT 47.0* 45.7 43.9  PLT 184 197 163    COAGS: Recent Labs    11/01/23 0527  INR 1.1    BMP: Recent Labs    10/30/23 0739 10/31/23 0740 11/01/23 0527  NA 137 137 138  K 3.8 4.2 3.8  CL 102 102 102  CO2 25 26 29   GLUCOSE 145* 94 104*  BUN 18 17 12   CALCIUM 9.6 9.0 8.9   CREATININE 0.66 0.64 0.62  GFRNONAA >60 >60 >60    LIVER FUNCTION TESTS: Recent Labs    10/30/23 0739  BILITOT 0.9  AST 29  ALT 16  ALKPHOS 66  PROT 6.3*  ALBUMIN 3.8    TUMOR MARKERS: No results for input(s): "AFPTM", "CEA", "CA199", "CHROMGRNA" in the last 8760 hours.  Assessment and Plan:  Scheduled for Port a cath placement in IR today Risks and benefits of image guided port-a-catheter placement was discussed with the patient including, but not limited to bleeding, infection, pneumothorax, or fibrin sheath development and need for additional procedures.  All of the patient's questions were answered, patient is agreeable to proceed. Consent signed and in chart.  Thank you for this interesting consult.  I greatly enjoyed meeting [place] and look forward to participating in their care.  A copy of this report was sent to the requesting provider on this date.  Electronically Signed: Robet Leu, PA-C 11/01/2023, 7:38 AM   I spent a total of 20 Minutes    in face to face in clinical consultation, greater than 50% of which was counseling/coordinating care for [facility] placement

## 2023-11-01 NOTE — Procedures (Signed)
Interventional Radiology Procedure Note  Procedure: Single Lumen Power Port Placement    Access:  Right internal jugular vein  Findings: Catheter tip positioned at cavoatrial junction. Port is ready for immediate use.   Complications: None  EBL: < 10 mL  Recommendations:  - OK to use line immediately, left accessed  - OK to shower in 24 hours - Do not submerge for 7 days - Routine line care    Marliss Coots, MD

## 2023-11-01 NOTE — Progress Notes (Signed)
PROGRESS NOTE    Lori Conrad  VHQ:[id number] DOB: [birth year] DOA: 10/30/2023 PCP: Henrine Screws, MD    Brief Narrative:   Lori Conrad is a 77 y.o. female with past medical history significant for hypothyroidism, hyperlipidemia who presented to [facility] ED on 11/18 with complaints of abdominal pain that started around 5:30 AM.  Patient had her annual physical exam with her primary care provider  where he had felt a possible mass on her abdomen at the end of last month.  She had ultrasound which a showed solid mass extending into the lumen of the bladder on the righ on 10/29.  Thereafter a CT scan of the abdomen pelvis have been ordered for further evaluation.  CT scan of the abdomen pelvis from 11/14 which noted 11 x 9 cm heterogeneous solid and cystic mass arising from the uterine fundus invading the urinary bladder dome wall as well as the distal sigmoid colon without signs of obstruction.  At that time patient had not reported any complaints of abdominal pain.  She was scheduled to follow-up with Dr. Alvester Morin this morning at 12:45 PM.  However this morning had awoken to severe pain in the right lower quadrant of her abdomen. Pain was sharp. Noted associated nausea, but had not had any vomiting.  Patient words after the CT scan she had a large bowel movement, but had not had any since.  Denies any prior history of abdominal surgeries or known family history for ovarian or uterine cancer.  Denies having any significant fever, cough, shortness of breath, dysuria, or blood in stool/urine.  Her weight has been stable at around 110 pounds.   In the emergency department patient was noted to be afebrile with stable vital signs.  Labs significant for WBC 11.3, glucose 145, and lipase 56.  Urinalysis did not note significant signs for infection.  CT scan of the abdomen pelvis noted multiple uterine fibroids with a area of the anterior cervix that appears to obliterate the fat plane between the cervix and  the posterior wall of the bladder that is concerning for cervical mass, soft tissue nodules in the cul-de-sac concerning for peritoneal nodularity, and 16 mm short axis portal caval lymph node for which metastatic disease was not ruled out.  Dr. Alvester Morin of gynecology oncology have been consulted.  Patient had been given 1 L normal saline IV fluids, morphine IV, and Dilaudid.   Assessment & Plan:   Complex pelvic mass Patient presenting to ED with abdominal pain, localized to the right lower quadrant.  CT abdomen/pelvis with multiple uterine fibroids, area of anterior cervix that appears to obliterate the fat plane between the cervix and posterior wall of the bladder concerning for cervical mass, soft tissue nodules in the cul-de-sac concerning for peritoneal nodularity and 16 mm short axis portal lymph node concerning for metastatic disease.  MR pelvis with large complex cystic and solid pelvic mass centrally measuring up to 11.5 x 11.6 x 11.4 cm, concerning for ovarian malignancy, small amount of ascites, soft tissue enhancing aggressive nodules along the course of the sigmoid colon as well as adjacent fat and presacral spaces.  Underwent IR port placement on 11/01/2023.  CEA 3.5 within normal limits, CA125 elevated at 401.0, CA 19-9 11, within normal limits. -- [facility] gastroenterology, medical oncology, and GYN-Onc following, appreciate assistance --  -- Clear liquid diet, n.p.o. after midnight for planned colonoscopy on 11/21  Hypothyroidism -- Levothyroxine 75 mcg p.o. daily  Hyperlipidemia -- Atorvastatin 40 mg p.o. 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  Continue outpatient follow-up/surveillance.   DVT prophylaxis: enoxaparin (LOVENOX) injection 40 mg Start: 10/30/23 2200    Code Status: Full Code Family Communication: Updated patient's son present at bedside this morning  Disposition Plan:  Level of  care: Med-Surg Status is: Inpatient Remains inpatient appropriate because: Pending colonoscopy    Consultants:  GYN-Onc Medical oncology [facility] gastroenterology Interventional radiology  Procedures:  Single-lumen PowerPort placement, interventional radiology, 11/01/2023  Antimicrobials:  None   Subjective: Patient seen examined bedside, resting calmly.  Lying in bed.  Son present at bedside.  Pain much better controlled on current regimen.  Awaiting colonoscopy planned for tomorrow.  No other specific questions or concerns at this time.  Denies headache, no visual changes, no chest pain, no palpitations, no shortness of breath, no abdominal pain, no fever/chills/night sweats, no nausea/vomiting/diarrhea, no focal weakness, no fatigue, no paresthesias.  No acute events overnight per nursing staff.  Objective: Vitals:   11/01/23 0900 11/01/23 0901 11/01/23 0947 11/01/23 1502  BP: 127/74 127/74 124/86 (!) 163/88  Pulse: 89 79 65 67  Resp: (!) 23 (!) 27 19 18   Temp:   98 F (36.7 C) 97.6 F (36.4 C)  TempSrc:   Oral Oral  SpO2: 95% 95% 94% 98%  Weight:      Height:        Intake/Output Summary (Last 24 hours) at 11/01/2023 1601 Last data filed at 11/01/2023 1035 Gross per 24 hour  Intake 100 ml  Output --  Net 100 ml   Filed Weights   10/30/23 0731 10/30/23 0958  Weight: 51.5 kg 49.4 kg    Examination:  Physical Exam: GEN: NAD, alert and oriented x 3, wd/wn HEENT: NCAT, PERRL, EOMI, sclera clear, MMM PULM: CTAB w/o wheezes/crackles, normal respiratory effort, on room air CV: RRR w/o M/G/R GI: abd soft, + soft mass right lower pelvis, + BS MSK: no peripheral edema, muscle strength globally intact 5/5 bilateral upper/lower extremities NEURO: CN II-XII intact, no focal deficits, sensation to light touch intact PSYCH: normal mood/affect Integumentary: dry/intact, no rashes or wounds    Data Reviewed: I have personally reviewed following labs and imaging  studies  CBC: Recent Labs  Lab 10/30/23 0739 10/31/23 0740 11/01/23 0527  WBC 11.3* 9.6 6.7  NEUTROABS  --   --  4.8  HGB 14.9 14.5 14.2  HCT 47.0* 45.7 43.9  MCV 91.4 91.6 90.0  PLT 184 197 163   Basic Metabolic Panel: Recent Labs  Lab 10/30/23 0739 10/31/23 0740 11/01/23 0527  NA 137 137 138  K 3.8 4.2 3.8  CL 102 102 102  CO2 25 26 29   GLUCOSE 145* 94 104*  BUN 18 17 12   CREATININE 0.66 0.64 0.62  CALCIUM 9.6 9.0 8.9  MG  --   --  2.0   GFR: Estimated Creatinine Clearance: 43.7 mL/min (by C-G formula based on SCr of 0.62 mg/dL). Liver Function Tests: Recent Labs  Lab 10/30/23 0739  AST 29  ALT 16  ALKPHOS 66  BILITOT 0.9  PROT 6.3*  ALBUMIN 3.8   Recent Labs  Lab 10/30/23 0739  LIPASE 56*   No results for input(s): "AMMONIA" in the last 168 hours. Coagulation Profile: Recent Labs  Lab 11/01/23 0527  INR 1.1   Cardiac Enzymes: No results for input(s): "CKTOTAL", "CKMB", "CKMBINDEX", "TROPONINI" in the last 168 hours. BNP (last 3 results) No results for input(s): "PROBNP" in  the last 8760 hours. HbA1C: No results for input(s): "HGBA1C" in the last 72 hours. CBG: No results for input(s): "GLUCAP" in the last 168 hours. Lipid Profile: No results for input(s): "CHOL", "HDL", "LDLCALC", "TRIG", "CHOLHDL", "LDLDIRECT" in the last 72 hours. Thyroid Function Tests: No results for input(s): "TSH", "T4TOTAL", "FREET4", "T3FREE", "THYROIDAB" in the last 72 hours. Anemia Panel: No results for input(s): "VITAMINB12", "FOLATE", "FERRITIN", "TIBC", "IRON", "RETICCTPCT" in the last 72 hours. Sepsis Labs: No results for input(s): "PROCALCITON", "LATICACIDVEN" in the last 168 hours.  No results found for this or any previous visit (from the past 240 hour(s)).       Radiology Studies: IR IMAGING GUIDED PORT INSERTION  Result Date: 11/01/2023 INDICATION: 77 year old female with suspected gynecologic malignancy requiring central venous access for  chemotherapy. EXAM: IMPLANTED PORT A CATH PLACEMENT WITH ULTRASOUND AND FLUOROSCOPIC GUIDANCE COMPARISON:  None Available. MEDICATIONS: None. ANESTHESIA/SEDATION: Moderate (conscious) sedation was employed during this procedure. A total of Versed 1 mg and Fentanyl 50 mcg was administered intravenously. Moderate Sedation Time: 15 minutes. The patient's level of consciousness and vital signs were monitored continuously by radiology nursing throughout the procedure under my direct supervision. CONTRAST:  None FLUOROSCOPY TIME:  One mGy COMPLICATIONS: None immediate. PROCEDURE: The procedure, risks, benefits, and alternatives were explained to the patient. Questions regarding the procedure were encouraged and answered. The patient understands and consents to the procedure. The right neck and chest were prepped with chlorhexidine in a sterile fashion, and a sterile drape was applied covering the operative field. Maximum barrier sterile technique with sterile gowns and gloves were used for the procedure. A timeout was performed prior to the initiation of the procedure. Ultrasound was used to examine the jugular vein which was compressible and free of internal echoes. A skin marker was used to demarcate the planned venotomy and port pocket incision sites. Local anesthesia was provided to these sites and the subcutaneous tunnel track with 1% lidocaine with 1:100,000 epinephrine. A small incision was created at the jugular access site and blunt dissection was performed of the subcutaneous tissues. Under ultrasound guidance, the jugular vein was accessed with a 21 ga micropuncture needle and an 0.018" wire was inserted to the superior vena cava. Real-time ultrasound guidance was utilized for vascular access including the acquisition of a permanent ultrasound image documenting patency of the accessed vessel. A 5 Fr micopuncture set was then used, through which a 0.035" Rosen wire was passed under fluoroscopic guidance into the  inferior vena cava. An 8 Fr dilator was then placed over the wire. A subcutaneous port pocket was then created along the upper chest wall utilizing a combination of sharp and blunt dissection. The pocket was irrigated with sterile saline, packed with gauze, and observed for hemorrhage. A single lumen plastic power injectable port was chosen for placement. The 8 Fr catheter was tunneled from the port pocket site to the venotomy incision. The port was placed in the pocket. The external catheter was trimmed to appropriate length. The dilator was exchanged for an 8 Fr peel-away sheath under fluoroscopic guidance. The catheter was then placed through the sheath and the sheath was removed. Final catheter positioning was confirmed and documented with a fluoroscopic spot radiograph. The port was accessed with a Huber needle, aspirated, and flushed with heparinized saline. The deep dermal layer of the port pocket incision was closed with interrupted 3-0 Vicryl suture. Dermabond was then placed over the port pocket and neck incisions. The patient tolerated the procedure well without immediate  post procedural complication. FINDINGS: After catheter placement, the tip lies within the superior cavoatrial junction. The catheter aspirates and flushes normally and is ready for immediate use. IMPRESSION: Successful placement of a power injectable Port-A-Cath via the right internal jugular vein. The catheter is ready for immediate use. Marliss Coots, MD [facility] Radiology Electronically Signed   By: Marliss Coots M.D.   On: 11/01/2023 13:22   MR ABDOMEN W WO CONTRAST  Result Date: 10/31/2023 CLINICAL DATA:  Pelvic mass.  Liver lesion EXAM: MRI ABDOMEN WITHOUT AND WITH CONTRAST TECHNIQUE: Multiplanar multisequence MR imaging of the abdomen was performed both before and after the administration of intravenous contrast. CONTRAST:  5mL GADAVIST GADOBUTROL 1 MMOL/ML IV SOLN  COMPARISON:  CT 10/30/2023 and older. FINDINGS: Lower chest: Trace pleural fluid. Hepatobiliary: Numerous bright T2, low T1 nonenhancing foci identified consistent with benign cystic lesions. Many of these are under 15 mm. There are some larger foci identified such as segment 4 measuring 4.3 cm and caudate measuring 2.8 cm. Prior CT did demonstrate 1 lesion which is more complex anteriorly in the left hepatic lobe, segment 3 which on today's examination when taking into account motion show some progressive enhancement, is bright on T2 but not as bright as simple fluid and consistent with a small hemangioma. No specific aggressive liver lesion clearly identified today. Patent portal vein. Gallbladder is nondilated. No biliary ductal dilatation. Pancreas: Ectatic pancreatic duct diffusely measuring up to 6 mm, severe. No pancreatic focal atrophy, abnormal enhancement or abnormal T1 signal. No restricted diffusion along the pancreas. Spleen:  Within normal limits in size and appearance. Adrenals/Urinary Tract: Adrenal glands are preserved. No enhancing renal mass or collecting system dilatation. Stomach/Bowel: Visualized bowel is nondilated. This includes visualized portions of the small and large bowel. The stomach is underdistended. Vascular/Lymphatic: Normal caliber aorta and IVC. Atherosclerotic changes along the aorta. Circumaortic left renal vein. Once again there is a abnormal lymph node identified anterior to the aorta in the upper abdomen on series 1602, image 58 measuring 2.3 x 1.8 cm. Few other small retroperitoneal nodes identified. Other:  Trace ascites.  Mesenteric stranding. Musculoskeletal: Curvature and degenerative changes along the spine. IMPRESSION: Multiple benign-appearing liver lesions including cysts and 1 hemangioma. Persistent enlarged upper abdominal retroperitoneal lymph node. Additional smaller but prominent nodes as well. With the pelvic findings these are worrisome for potential spread of  neoplasm. Mild ascites. Electronically Signed   By: Karen Kays M.D.   On: 10/31/2023 14:10   MR PELVIS W WO CONTRAST  Result Date: 10/31/2023 CLINICAL DATA:  Pelvic mass EXAM: MRI PELVIS WITHOUT AND WITH CONTRAST TECHNIQUE: Multiplanar multisequence MR imaging of the pelvis was performed both before and after administration of intravenous contrast. CONTRAST:  5mL GADAVIST GADOBUTROL 1 MMOL/ML IV SOLN COMPARISON:  CT 10/30/2019 FINDINGS: Urinary Tract: Bladder is mildly distended with fluid. There is some mass effect along the posterior aspect of the bladder related to the adjacent complex mass. The bladder itself appears grossly intact. No abnormal wall enhancement. Grossly preserved course of the urethra. Bowel: The visualized bowel in the pelvis is nondilated. However there is lobular masslike area along the distal sigmoid colon with the areas of heterogeneous enhancement. The dominant lesion in this location on series 4, image 29 measures 3.7 by 2.6 cm. There are several adjacent soft tissue nodules as well such as just posterior to the cervix and anterior to the bowel measuring 10 mm on series 22 image 30. Focus superior left  lateral series 22, image 26 measures 2.3 x 1.7 cm. Additional foci elsewhere dependently in the pelvis including the presacral space. Vascular/Lymphatic: Atherosclerotic changes identified along the iliac vessels. No separate nodal enlargement identified. Reproductive: Uterus measures 8.2 by 2.0 by 3.4 cm. Endometrial stripe is less than 3 mm. Slightly heterogeneous myometrium. Anterior to the uterus is a large complex cystic and solid mass with heterogeneous enhancement of the solid component. Lesion measures 11.5 by 7.6 by 7.4 cm. The more solid component is right lateral inferior with a aggressive in enhancement measuring proximally 7.7 by 5.6 cm. Cephalocaudal length 8.5 cm. The cystic component more towards the left has a dimensions approaching 8.7 cm. There is surrounding free  fluid and edema. Although this more in the central pelvis towards midline a right ovary is not seen as a separate structure. There is what appears to be a small postmenopausal of the left ovary measuring 15 mm. An ovarian neoplasm is a strong consideration. Other:  Small amount of free fluid in the pelvis.  Edema. Musculoskeletal: Curvature of the spine. Moderate degenerative changes of the lumbar spine with disc bulging and areas of stenosis greatest at L4-5. Degenerative changes of the pelvis and hips as well. Study is somewhat limited due to some artifacts postcontrast axial dataset as well as study being performed as a standard pelvis rather than a gynecologic pelvis exam. Please see separate dictation of abdomen MRI. IMPRESSION: Large complex cystic and solid pelvic mass centrally measuring up to 11.5 x 7.6 x 7.4 cm. Based on overall appearance this has worrisome for a neoplasm including an ovarian malignancy. Small amount of ascites in the pelvis. Soft tissue enhancing aggressive nodules along the course of the sigmoid colon as well as in the adjacent fat and presacral spaces. With the larger central mass this very well could be spread of disease to adjacent structures rather than a primary colonic process but correlate with symptoms and if needed colonoscopy. Please see separate dictation of abdominal MRI. Electronically Signed   By: Karen Kays M.D.   On: 10/31/2023 14:01   CT CHEST W CONTRAST  Result Date: 10/31/2023 CLINICAL DATA:  77 year old female with suspected gynecologic malignancy. * Tracking Code: BO * EXAM: CT CHEST WITH CONTRAST TECHNIQUE: Multidetector CT imaging of the chest was performed during intravenous contrast administration. RADIATION DOSE REDUCTION: This exam was performed according to the departmental dose-optimization program which includes automated exposure control, adjustment of the mA and/or kV according to patient size and/or use of iterative reconstruction technique.  CONTRAST:  75mL OMNIPAQUE IOHEXOL 350 MG/ML SOLN COMPARISON:  None Available. FINDINGS: Cardiovascular: The heart size is mildly enlarged. No pericardial effusion. Aortic atherosclerosis and coronary artery calcification. Mediastinum/Nodes: Trachea and esophagus appear unremarkable. The right lobe of thyroid gland appears surgically absent. No mediastinal or hilar adenopathy. Lungs/Pleura: No pleural effusion identified. Subsegmental atelectasis identified within the lingula and bilateral posterior lung bases. No signs of interstitial edema or airspace consolidation. No suspicious pulmonary nodule identified to suggest lung metastases. Upper Abdomen: No acute abnormality. Multiple liver cysts. Enlarged lymph node within the portal caval region measures 1.5 cm, image 155/3. Defer to report from CT AP dated 10/30/2019 for and same-day MRI of the abdomen pelvis for further details. Musculoskeletal: Mild curvature of the thoracic spine and lumbar spine is convex towards the left. Multilevel degenerative disc disease. No acute or suspicious osseous lesions. IMPRESSION: 1. No signs of metastatic disease to the chest. 2. Areas of subsegmental atelectasis noted within bilateral posterior lung bases  and lingula. 3. Enlarged upper abdominal lymph node as above. In the setting of a known malignancy this is concerning for nodal metastasis. 4. Coronary artery calcifications. 5.  Aortic Atherosclerosis (ICD10-I70.0). Electronically Signed   By: Signa Kell M.D.   On: 10/31/2023 05:57        Scheduled Meds:  atorvastatin  40 mg Oral QHS   enoxaparin (LOVENOX) injection  40 mg Subcutaneous Q24H   folic acid  1 mg Oral Daily   levothyroxine  75 mcg Oral Q0600   multivitamin with minerals  1 tablet Oral Daily   sodium chloride flush  3 mL Intravenous Q12H   thiamine  100 mg Oral Daily   Or   thiamine  100 mg Intravenous Daily   Continuous Infusions:  sodium chloride       LOS: 1 day    Time spent: 52  minutes spent on chart review, discussion with nursing staff, consultants, updating family and interview/physical exam; more than 50% of that time was spent in counseling and/or coordination of care.    Alvira Philips [place], DO [facility] Available via Epic secure chat 7am-7pm After these hours, please refer to coverage provider listed on [email or web address] 11/01/2023, 4:01 PM

## 2023-11-01 NOTE — Anesthesia Preprocedure Evaluation (Signed)
Anesthesia Evaluation  Patient identified by MRN, date of birth, ID band Patient awake    Reviewed: Allergy & Precautions, NPO status , Patient's Chart, lab work & pertinent test results  History of Anesthesia Complications Negative for: history of anesthetic complications  Airway Mallampati: II  TM Distance: >3 FB Neck ROM: Full    Dental no notable dental hx.    Pulmonary former smoker   Pulmonary exam normal        Cardiovascular hypertension, Normal cardiovascular exam  10/2022 Stress: Myocardial perfusion is normal. Overall LV systolic function is normal without regional wall motion abnormalities. Rest LV EF: 80%. Stress LV EF: 80%. Low risk study. Normal ECG stress  10/2022 ECHO: Normal LV systolic function with visual EF 60-65%. Left ventricle cavity is normal in size. Hypertrophic cardiomyopathy. Mild concentric LVH. Normal global wall motion.  grade I (impaired) diastolic dysfunction, normal LAP.  Calculated EF 69%.  Trileaflet aortic valve with no regurgitation. Mild aortic valve leaflet thickening with moderate calcification.  No mitral valve regurgitation. Mild mitral valve leaflet thickening.  Structurally normal tricuspid valve.  Mild tricuspid regurgitation.  The aortic root is mildly dilated at 4.1 cm. Mildly dilated ascending aorta at 3.9 cm.     Neuro/Psych    GI/Hepatic ,,,(+)     substance abuse  alcohol useSigmoid mass:  Large complex cystic and solid pelvic mass, 11.5 x 7.6 x 7.4 cm worrisome for neoplasm with nodules along course of sigmoid, large central mass    Endo/Other  Hypothyroidism    Renal/GU      Musculoskeletal   Abdominal   Peds  Hematology   Anesthesia Other Findings   Reproductive/Obstetrics                              Anesthesia Physical Anesthesia Plan  ASA: 3  Anesthesia Plan: MAC   Post-op Pain Management: Minimal or no pain anticipated    Induction:   PONV Risk Score and Plan: 2 and Treatment may vary due to age or medical condition  Airway Management Planned: Nasal Cannula and Natural Airway  Additional Equipment:   Intra-op Plan:   Post-operative Plan:   Informed Consent: I have reviewed the patients History and Physical, chart, labs and discussed the procedure including the risks, benefits and alternatives for the proposed anesthesia with the patient or authorized representative who has indicated his/her understanding and acceptance.       Plan Discussed with: CRNA  Anesthesia Plan Comments:          Anesthesia Quick Evaluation

## 2023-11-01 NOTE — Consult Note (Signed)
[facility]enterology Consult  Referring Provider: [facility] hospitalist/Dr. Hanley Ben Primary Care Physician:  Henrine Screws, MD Primary Gastroenterologist: Previous patient of Dr. Matthias Hughs, recently seen Dr. Marca Ancona 11/23/[facility]  Reason for Consultation: Need for colonoscopy  HPI: Lori Conrad is a 77 y.o. female presented to the ER with right lower back pain described as severe, sharp which prompted her to come to the ER.  Patient had an outpatient CAT scan with primary care physician on 10/26/2023 which showed 11 x 9 cm mass from uterine fundus invading urinary bladder as well as distal sigmoid.  She was seen by GYN, underwent  CT abdomen and pelvis with contrast on 10/30/2023: Multiple uterine fibroids, soft tissue nodules in cul-de-sac left colonic diverticulosis  MRI pelvis with contrast 10/30/2023: Large complex cystic and solid pelvic mass, 11.5 x 7.6 x 7.4 cm worrisome for neoplasm with nodules along course  of sigmoid, large central mass, recommend colonoscopy if needed  MRI abdomen with and without contrast 10/30/2023: Multiple liver lesions, possible cyst and hemangiomas with persistent enlarged upper abdominal retroperitoneal lymph nodes worrisome for potential spread of neoplasm from pelvis  CT chest with contrast 10/30/2023: No metastatic disease to chest  Last seen in office in 10/2022 and advised to take a high-fiber diet for diverticulosis. Patient denies change in bowel habits.  She reports usually having bowel movement every day, denies noticing blood in stool or black stools.  Denies unintentional weight loss or loss of appetite.  She complains of mild nausea without vomiting.  Denies acid reflux or heartburn.  Denies difficulty swallowing or pain on swallowing.  Previous GI workup: Colonoscopy, 2015, Dr. Matthias Hughs, screening: Diverticulosis in sigmoid, no other abnormalities, otherwise, repeat recommended in 10 years. Colonoscopy, 2005, occult blood in stool, Dr.  Matthias Hughs: Hyperplastic rectal polyps removed EGD, 2005, occult blood in stool: Unremarkable Flexible sigmoidoscopy, 2000, Dr. Matthias Hughs Flexible sigmoidoscopy, 1992, Dr. Matthias Hughs, diarrhea: Active colitis with few crypt abscesses  Past Medical History:  Diagnosis Date   Hyperlipidemia    Hypothyroidism    Macular degeneration     Past Surgical History:  Procedure Laterality Date   ANKLE FRACTURE SURGERY Left    BROW LIFT Bilateral 05/17/2022   Procedure: BILATERAL UPPER BLEPHAROPLASTY WITH PTOSIS REPAIR;  Surgeon: Janne Napoleon, MD;  Location: [facility] OR;  Service: Plastics;  Laterality: Bilateral;  1.5 hours   BUNIONECTOMY Left    EYE SURGERY     REFRACTIVE SURGERY     laser for macular degeneration   SHOULDER ARTHROSCOPY     TONSILLECTOMY      Prior to Admission medications   Medication Sig Start Date End Date Taking? Authorizing Provider  atorvastatin (LIPITOR) 40 MG tablet Take 40 mg by mouth at bedtime.   Yes [provider]  Calcium Carb-Cholecalciferol (SUPER CALCIUM 600 + D 400 PO) Take 1 tablet by mouth daily.   Yes [provider]  Cholecalciferol (VITAMIN D) 50 MCG (2000 UT) tablet Take 2,000 Units by mouth daily.   Yes [provider]  Coenzyme Q10 (COQ10) 400 MG CAPS Take 400 mg by mouth daily.   Yes [provider]  doxylamine, Sleep, (UNISOM) 25 MG tablet Take 25 mg by mouth at bedtime as needed for sleep.   Yes [provider]  Glucosamine-Chondroit-Vit C-Mn (GLUCOSAMINE 1500 COMPLEX PO) Take 1 tablet by mouth daily.   Yes [provider]  levothyroxine (SYNTHROID) 75 MCG tablet Take 75 mcg by mouth daily before breakfast.   Yes [provider]  Magnesium 250  MG TABS Take 250 mg by mouth daily.   Yes [provider]  meloxicam (MOBIC) 7.5 MG tablet Take 7.5 mg by mouth daily. 07/28/21  Yes [provider]  Multiple Vitamins-Minerals (PRESERVISION AREDS 2+MULTI VIT PO) Take 1 tablet by mouth 2  (two) times daily. 11/12/19  Yes [provider]  Omega-3 Fatty Acids (OMEGA 3 500) 500 MG CAPS Take 500 mg by mouth daily.   Yes [provider]  COMIRNATY syringe  09/08/22   [provider]    Current Facility-Administered Medications  Medication Dose Route Frequency Provider Last Rate Last Admin   acetaminophen (TYLENOL) tablet 650 mg  650 mg Oral Q6H PRN Clydie Braun, MD       Or   acetaminophen (TYLENOL) suppository 650 mg  650 mg Rectal Q6H PRN Madelyn Flavors A, MD       albuterol (PROVENTIL) (2.5 MG/3ML) 0.083% nebulizer solution 2.5 mg  2.5 mg Nebulization Q6H PRN Katrinka Blazing, Rondell A, MD       atorvastatin (LIPITOR) tablet 40 mg  40 mg Oral QHS Smith, Rondell A, MD   40 mg at 10/31/23 2109   doxylamine (Sleep) (UNISOM) tablet 25 mg  25 mg Oral QHS PRN Madelyn Flavors A, MD       enoxaparin (LOVENOX) injection 40 mg  40 mg Subcutaneous Q24H Smith, Rondell A, MD   40 mg at 10/31/23 2111   folic acid (FOLVITE) tablet 1 mg  1 mg Oral Daily Smith, Rondell A, MD   1 mg at 10/31/23 0815   hydrALAZINE (APRESOLINE) injection 10 mg  10 mg Intravenous Q4H PRN Madelyn Flavors A, MD       HYDROcodone-acetaminophen (NORCO/VICODIN) 5-325 MG per tablet 1-2 tablet  1-2 tablet Oral Q4H PRN Glade Lloyd, MD   2 tablet at 11/01/23 0617   levothyroxine (SYNTHROID) tablet 75 mcg  75 mcg Oral Q0600 Madelyn Flavors A, MD   75 mcg at 11/01/23 0618   LORazepam (ATIVAN) tablet 1-4 mg  1-4 mg Oral Q1H PRN Madelyn Flavors A, MD       Or   LORazepam (ATIVAN) injection 1-4 mg  1-4 mg Intravenous Q1H PRN Katrinka Blazing, Rondell A, MD       morphine (PF) 2 MG/ML injection 2 mg  2 mg Intravenous Q2H PRN Madelyn Flavors A, MD   2 mg at 10/31/23 1156   multivitamin with minerals tablet 1 tablet  1 tablet Oral Daily Madelyn Flavors A, MD   1 tablet at 10/31/23 0815   polyethylene glycol-electrolytes (NuLYTELY) solution 4,000 mL  4,000 mL Oral Once Kerin Salen, MD       sodium chloride flush (NS) 0.9 %  injection 3 mL  3 mL Intravenous Q12H Smith, Rondell A, MD   3 mL at 10/31/23 2111   thiamine (VITAMIN B1) tablet 100 mg  100 mg Oral Daily Madelyn Flavors A, MD   100 mg at 10/31/23 2952   Or   thiamine (VITAMIN B1) injection 100 mg  100 mg Intravenous Daily Clydie Braun, MD        Allergies as of 10/30/2023   (No Known Allergies)    Family History  Problem Relation Age of Onset   Lung cancer Mother    Stroke Mother 13   Alzheimer's disease Father    CAD Father        95% occlusion of L Main & RCA, severe descending aorta, arterial & arteriolonephrosclerosis    Cholecystitis Father    Esophagitis  Father    Pancreatic cancer Daughter    BRCA 1/2 Neg Hx    Breast cancer Neg Hx    Colon cancer Neg Hx    Ovarian cancer Neg Hx    Endometrial cancer Neg Hx    Prostate cancer Neg Hx     Social History   Socioeconomic History   Marital status: Widowed    Spouse name: Not on file   Number of children: 2   Years of education: Not on file   Highest education level: Not on file  Occupational History   Not on file  Tobacco Use   Smoking status: Former    Current packs/day: 0.00    Types: Cigarettes    Quit date: 05/15/1988    Years since quitting: 35.4   Smokeless tobacco: Never  Vaping Use   Vaping status: Never Used  Substance and Sexual Activity   Alcohol use: Yes    Alcohol/week: 14.0 standard drinks of alcohol    Types: 14 drink(s) per week    Comment: white wine   Drug use: No   Sexual activity: Not Currently    Partners: Male  Other Topics Concern   Not on file  Social History Narrative   Not on file   Social Determinants of Health   Financial Resource Strain: Not on file  Food Insecurity: No Food Insecurity (10/30/2023)   Hunger Vital Sign    Worried About Running Out of Food in the Last Year: Never true    Ran Out of Food in the Last Year: Never true  Transportation Needs: No Transportation Needs (10/30/2023)   PRAPARE - Scientist, research (physical sciences) (Medical): No    Lack of Transportation (Non-Medical): No  Physical Activity: Not on file  Stress: Not on file  Social Connections: Not on file  Intimate Partner Violence: Not At Risk (10/30/2023)   Humiliation, Afraid, Rape, and Kick questionnaire    Fear of Current or Ex-Partner: No    Emotionally Abused: No    Physically Abused: No    Sexually Abused: No    Review of Systems: As per HPI Physical Exam: Vital signs in last 24 hours: Temp:  [97.7 F (36.5 C)-98.7 F (37.1 C)] 98.6 F (37 C) (11/20 0619) Pulse Rate:  [61-71] 61 (11/20 0619) Resp:  [16-19] 16 (11/20 0619) BP: (109-152)/(74-98) 152/98 (11/20 0619) SpO2:  [95 %-96 %] 95 % (11/20 0619)    General:   Alert,  Well-developed, well-nourished, pleasant and cooperative in NAD Head:  Normocephalic and atraumatic. Eyes:  Sclera clear, no icterus.   Conjunctiva pink. Ears:  Normal auditory acuity. Nose:  No deformity, discharge,  or lesions. Mouth:  No deformity or lesions.  Oropharynx pink & moist. Neck:  Supple; no masses or thyromegaly. Lungs:  Clear throughout to auscultation.   No wheezes, crackles, or rhonchi. No acute distress. Heart:  Regular rate and rhythm; no murmurs, clicks, rubs,  or gallops. Extremities:  Without clubbing or edema. Neurologic:  Alert and  oriented x4;  grossly normal neurologically. Skin:  Intact without significant lesions or rashes. Psych:  Alert and cooperative. Normal mood and affect. Abdomen: Soft mass felt in lower pelvis        Lab Results: Recent Labs    10/30/23 0739 10/31/23 0740 11/01/23 0527  WBC 11.3* 9.6 6.7  HGB 14.9 14.5 14.2  HCT 47.0* 45.7 43.9  PLT 184 197 163   BMET Recent Labs    10/30/23 0739 10/31/23 0740  11/01/23 0527  NA 137 137 138  K 3.8 4.2 3.8  CL 102 102 102  CO2 25 26 29   GLUCOSE 145* 94 104*  BUN 18 17 12   CREATININE 0.66 0.64 0.62  CALCIUM 9.6 9.0 8.9   LFT Recent Labs    10/30/23 0739  PROT 6.3*  ALBUMIN 3.8   AST 29  ALT 16  ALKPHOS 66  BILITOT 0.9   PT/INR Recent Labs    11/01/23 0527  LABPROT 14.3  INR 1.1    Studies/Results: MR ABDOMEN W WO CONTRAST  Result Date: 10/31/2023 CLINICAL DATA:  Pelvic mass.  Liver lesion EXAM: MRI ABDOMEN WITHOUT AND WITH CONTRAST TECHNIQUE: Multiplanar multisequence MR imaging of the abdomen was performed both before and after the administration of intravenous contrast. CONTRAST:  5mL GADAVIST GADOBUTROL 1 MMOL/ML IV SOLN COMPARISON:  CT 10/30/2023 and older. FINDINGS: Lower chest: Trace pleural fluid. Hepatobiliary: Numerous bright T2, low T1 nonenhancing foci identified consistent with benign cystic lesions. Many of these are under 15 mm. There are some larger foci identified such as segment 4 measuring 4.3 cm and caudate measuring 2.8 cm. Prior CT did demonstrate 1 lesion which is more complex anteriorly in the left hepatic lobe, segment 3 which on today's examination when taking into account motion show some progressive enhancement, is bright on T2 but not as bright as simple fluid and consistent with a small hemangioma. No specific aggressive liver lesion clearly identified today. Patent portal vein. Gallbladder is nondilated. No biliary ductal dilatation. Pancreas: Ectatic pancreatic duct diffusely measuring up to 6 mm, severe. No pancreatic focal atrophy, abnormal enhancement or abnormal T1 signal. No restricted diffusion along the pancreas. Spleen:  Within normal limits in size and appearance. Adrenals/Urinary Tract: Adrenal glands are preserved. No enhancing renal mass or collecting system dilatation. Stomach/Bowel: Visualized bowel is nondilated. This includes visualized portions of the small and large bowel. The stomach is underdistended. Vascular/Lymphatic: Normal caliber aorta and IVC. Atherosclerotic changes along the aorta. Circumaortic left renal vein. Once again there is a abnormal lymph node identified anterior to the aorta in the upper abdomen on  series 1602, image 58 measuring 2.3 x 1.8 cm. Few other small retroperitoneal nodes identified. Other:  Trace ascites.  Mesenteric stranding. Musculoskeletal: Curvature and degenerative changes along the spine. IMPRESSION: Multiple benign-appearing liver lesions including cysts and 1 hemangioma. Persistent enlarged upper abdominal retroperitoneal lymph node. Additional smaller but prominent nodes as well. With the pelvic findings these are worrisome for potential spread of neoplasm. Mild ascites. Electronically Signed   By: Karen Kays M.D.   On: 10/31/2023 14:10   MR PELVIS W WO CONTRAST  Result Date: 10/31/2023 CLINICAL DATA:  Pelvic mass EXAM: MRI PELVIS WITHOUT AND WITH CONTRAST TECHNIQUE: Multiplanar multisequence MR imaging of the pelvis was performed both before and after administration of intravenous contrast. CONTRAST:  5mL GADAVIST GADOBUTROL 1 MMOL/ML IV SOLN COMPARISON:  CT 10/30/2019 FINDINGS: Urinary Tract: Bladder is mildly distended with fluid. There is some mass effect along the posterior aspect of the bladder related to the adjacent complex mass. The bladder itself appears grossly intact. No abnormal wall enhancement. Grossly preserved course of the urethra. Bowel: The visualized bowel in the pelvis is nondilated. However there is lobular masslike area along the distal sigmoid colon with the areas of heterogeneous enhancement. The dominant lesion in this location on series 4, image 29 measures 3.7 by 2.6 cm. There are several adjacent soft tissue nodules as well such as just posterior to the cervix  and anterior to the bowel measuring 10 mm on series 22 image 30. Focus superior left lateral series 22, image 26 measures 2.3 x 1.7 cm. Additional foci elsewhere dependently in the pelvis including the presacral space. Vascular/Lymphatic: Atherosclerotic changes identified along the iliac vessels. No separate nodal enlargement identified. Reproductive: Uterus measures 8.2 by 2.0 by 3.4 cm.  Endometrial stripe is less than 3 mm. Slightly heterogeneous myometrium. Anterior to the uterus is a large complex cystic and solid mass with heterogeneous enhancement of the solid component. Lesion measures 11.5 by 7.6 by 7.4 cm. The more solid component is right lateral inferior with a aggressive in enhancement measuring proximally 7.7 by 5.6 cm. Cephalocaudal length 8.5 cm. The cystic component more towards the left has a dimensions approaching 8.7 cm. There is surrounding free fluid and edema. Although this more in the central pelvis towards midline a right ovary is not seen as a separate structure. There is what appears to be a small postmenopausal of the left ovary measuring 15 mm. An ovarian neoplasm is a strong consideration. Other:  Small amount of free fluid in the pelvis.  Edema. Musculoskeletal: Curvature of the spine. Moderate degenerative changes of the lumbar spine with disc bulging and areas of stenosis greatest at L4-5. Degenerative changes of the pelvis and hips as well. Study is somewhat limited due to some artifacts postcontrast axial dataset as well as study being performed as a standard pelvis rather than a gynecologic pelvis exam. Please see separate dictation of abdomen MRI. IMPRESSION: Large complex cystic and solid pelvic mass centrally measuring up to 11.5 x 7.6 x 7.4 cm. Based on overall appearance this has worrisome for a neoplasm including an ovarian malignancy. Small amount of ascites in the pelvis. Soft tissue enhancing aggressive nodules along the course of the sigmoid colon as well as in the adjacent fat and presacral spaces. With the larger central mass this very well could be spread of disease to adjacent structures rather than a primary colonic process but correlate with symptoms and if needed colonoscopy. Please see separate dictation of abdominal MRI. Electronically Signed   By: Karen Kays M.D.   On: 10/31/2023 14:01   CT CHEST W CONTRAST  Result Date:  10/31/2023 CLINICAL DATA:  77 year old female with suspected gynecologic malignancy. * Tracking Code: BO * EXAM: CT CHEST WITH CONTRAST TECHNIQUE: Multidetector CT imaging of the chest was performed during intravenous contrast administration. RADIATION DOSE REDUCTION: This exam was performed according to the departmental dose-optimization program which includes automated exposure control, adjustment of the mA and/or kV according to patient size and/or use of iterative reconstruction technique. CONTRAST:  75mL OMNIPAQUE IOHEXOL 350 MG/ML SOLN COMPARISON:  None Available. FINDINGS: Cardiovascular: The heart size is mildly enlarged. No pericardial effusion. Aortic atherosclerosis and coronary artery calcification. Mediastinum/Nodes: Trachea and esophagus appear unremarkable. The right lobe of thyroid gland appears surgically absent. No mediastinal or hilar adenopathy. Lungs/Pleura: No pleural effusion identified. Subsegmental atelectasis identified within the lingula and bilateral posterior lung bases. No signs of interstitial edema or airspace consolidation. No suspicious pulmonary nodule identified to suggest lung metastases. Upper Abdomen: No acute abnormality. Multiple liver cysts. Enlarged lymph node within the portal caval region measures 1.5 cm, image 155/3. Defer to report from CT AP dated 10/30/2019 for and same-day MRI of the abdomen pelvis for further details. Musculoskeletal: Mild curvature of the thoracic spine and lumbar spine is convex towards the left. Multilevel degenerative disc disease. No acute or suspicious osseous lesions. IMPRESSION: 1. No signs of  metastatic disease to the chest. 2. Areas of subsegmental atelectasis noted within bilateral posterior lung bases and lingula. 3. Enlarged upper abdominal lymph node as above. In the setting of a known malignancy this is concerning for nodal metastasis. 4. Coronary artery calcifications. 5.  Aortic Atherosclerosis (ICD10-I70.0). Electronically Signed    By: Signa Kell M.D.   On: 10/31/2023 05:57   CT ABDOMEN PELVIS W CONTRAST  Result Date: 10/30/2023 CLINICAL DATA:  Abdominal pain. EXAM: CT ABDOMEN AND PELVIS WITH CONTRAST TECHNIQUE: Multidetector CT imaging of the abdomen and pelvis was performed using the standard protocol following bolus administration of intravenous contrast. RADIATION DOSE REDUCTION: This exam was performed according to the departmental dose-optimization program which includes automated exposure control, adjustment of the mA and/or kV according to patient size and/or use of iterative reconstruction technique. CONTRAST:  75mL OMNIPAQUE IOHEXOL 350 MG/ML SOLN COMPARISON:  Limb 1424 FINDINGS: Lower chest: No acute findings. Hepatobiliary: Multiple hepatic cysts evident. Scattered tiny hypodensities in the liver parenchyma are too small to characterize but are statistically most likely benign. No followup imaging is recommended. Tiny subcapsular lesion measured previously at 9 mm in the anterior left liver is stable on image 23/3 today, nonspecific. There is no evidence for gallstones, gallbladder wall thickening, or pericholecystic fluid. No intrahepatic or extrahepatic biliary dilation. Pancreas: Dilatation of the pancreatic duct to the head and body of pancreas is similar to prior. Spleen: No splenomegaly. No suspicious focal mass lesion. Adrenals/Urinary Tract: No adrenal nodule or mass. Kidneys unremarkable. No evidence for hydroureter. Bladder is distended. Stomach/Bowel: Stomach is unremarkable. No gastric wall thickening. No evidence of outlet obstruction. Duodenum is normally positioned as is the ligament of Treitz. No small bowel wall thickening. No small bowel dilatation. Diverticular changes are noted in the left colon without evidence of diverticulitis. Vascular/Lymphatic: 16 mm short axis portal caval lymph node seen on 22/3. No para-aortic lymphadenopathy. No pelvic sidewall lymphadenopathy. Reproductive: Multiple uterine  fibroids evident. As noted on prior study there is an area of the anterior cervix that appears to obliterate the fat plane between the cervix and the posterior wall of the bladder (see sagittal 86/7). Small soft tissue nodules are again noted in the cul-de-sac some of which may pertain to diverticuli, but others raise concern for peritoneal nodularity (see images 61 and 56 of series 3). Other: No substantial free fluid. Musculoskeletal: No worrisome lytic or sclerotic osseous abnormality. IMPRESSION: 1. Multiple uterine fibroids. As noted on prior study there is an area of the anterior cervix that appears to obliterate the fat plane between the cervix and the posterior wall of the bladder. This is concerning for a cervical mass. Gynecologic consultation recommended. 2. Small soft tissue nodules in the cul-de-sac some of which may relate to diverticuli, but others raise concern for peritoneal nodularity. Attention on follow-up recommended. PET-CT may prove helpful to further evaluate 3. 16 mm short axis portal caval lymph node, metastatic disease not excluded. 4. Left colonic diverticulosis without diverticulitis. Electronically Signed   By: Kennith Center M.D.   On: 10/30/2023 13:08    Impression: Large pelvic mass suspect GYN malignancy Endometrial/cervical biopsies obtained yesterday Patient scheduled for Port-A-Cath placement today Colonoscopy recommended for further evaluation  Plan: Clear liquid diet after Port-A-Cath placement. Colonoscopy prep hopefully can be started at 2 PM today. Diagnostic colonoscopy tomorrow. The risks and the benefits of the procedure were discussed with the patient in details. She understands and verbalizes consent.   LOS: 1 day   Kerin Salen, MD  11/01/2023, 7:54 AM

## 2023-11-01 NOTE — Plan of Care (Signed)
  Problem: Education: Goal: Knowledge of General Education information will improve Description: Including pain rating scale, medication(s)/side effects and non-pharmacologic comfort measures Outcome: Progressing   Problem: Clinical Measurements: Goal: Will remain free from infection Outcome: Progressing   Problem: Activity: Goal: Risk for activity intolerance will decrease Outcome: Progressing   Problem: Nutrition: Goal: Adequate nutrition will be maintained Outcome: Progressing   Problem: Elimination: Goal: Will not experience complications related to urinary retention Outcome: Progressing   Problem: Pain Management: Goal: General experience of comfort will improve Outcome: Progressing   Problem: Safety: Goal: Ability to remain free from injury will improve Outcome: Progressing

## 2023-11-01 NOTE — H&P (View-Only) (Signed)
[facility] Gastroenterology Consult  Referring Provider: [facility] hospitalist/Dr. Hanley Ben Primary Care Physician:  Henrine Screws, MD Primary Gastroenterologist: Previous patient of Dr. Matthias Hughs, recently seen Dr. Marca Ancona 11/23/[facility]  Reason for Consultation: Need for colonoscopy  HPI: Lori Conrad is a 77 y.o. female presented to the ER with right lower back pain described as severe, sharp which prompted her to come to the ER.  Patient had an outpatient CAT scan with primary care physician on 10/26/2023 which showed 11 x 9 cm mass from uterine fundus invading urinary bladder as well as distal sigmoid.  She was seen by GYN, underwent  CT abdomen and pelvis with contrast on 10/30/2023: Multiple uterine fibroids, soft tissue nodules in cul-de-sac left colonic diverticulosis  MRI pelvis with contrast 10/30/2023: Large complex cystic and solid pelvic mass, 11.5 x 7.6 x 7.4 cm worrisome for neoplasm with nodules along course  of sigmoid, large central mass, recommend colonoscopy if needed  MRI abdomen with and without contrast 10/30/2023: Multiple liver lesions, possible cyst and hemangiomas with persistent enlarged upper abdominal retroperitoneal lymph nodes worrisome for potential spread of neoplasm from pelvis  CT chest with contrast 10/30/2023: No metastatic disease to chest  Last seen in office in 10/2022 and advised to take a high-fiber diet for diverticulosis. Patient denies change in bowel habits.  She reports usually having bowel movement every day, denies noticing blood in stool or black stools.  Denies unintentional weight loss or loss of appetite.  She complains of mild nausea without vomiting.  Denies acid reflux or heartburn.  Denies difficulty swallowing or pain on swallowing.  Previous GI workup: Colonoscopy, 2015, Dr. Matthias Hughs, screening: Diverticulosis in sigmoid, no other abnormalities, otherwise, repeat recommended in 10 years. Colonoscopy, 2005, occult blood in stool, Dr.  Matthias Hughs: Hyperplastic rectal polyps removed EGD, 2005, occult blood in stool: Unremarkable Flexible sigmoidoscopy, 2000, Dr. Matthias Hughs Flexible sigmoidoscopy, 1992, Dr. Matthias Hughs, diarrhea: Active colitis with few crypt abscesses  Past Medical History:  Diagnosis Date   Hyperlipidemia    Hypothyroidism    Macular degeneration     Past Surgical History:  Procedure Laterality Date   ANKLE FRACTURE SURGERY Left    BROW LIFT Bilateral 05/17/2022   Procedure: BILATERAL UPPER BLEPHAROPLASTY WITH PTOSIS REPAIR;  Surgeon: Janne Napoleon, MD;  Location: [facility] OR;  Service: Plastics;  Laterality: Bilateral;  1.5 hours   BUNIONECTOMY Left    EYE SURGERY     REFRACTIVE SURGERY     laser for macular degeneration   SHOULDER ARTHROSCOPY     TONSILLECTOMY      Prior to Admission medications   Medication Sig Start Date End Date Taking? Authorizing Provider  atorvastatin (LIPITOR) 40 MG tablet Take 40 mg by mouth at bedtime.   Yes [provider]  Calcium Carb-Cholecalciferol (SUPER CALCIUM 600 + D 400 PO) Take 1 tablet by mouth daily.   Yes [provider]  Cholecalciferol (VITAMIN D) 50 MCG (2000 UT) tablet Take 2,000 Units by mouth daily.   Yes [provider]  Coenzyme Q10 (COQ10) 400 MG CAPS Take 400 mg by mouth daily.   Yes [provider]  doxylamine, Sleep, (UNISOM) 25 MG tablet Take 25 mg by mouth at bedtime as needed for sleep.   Yes [provider]  Glucosamine-Chondroit-Vit C-Mn (GLUCOSAMINE 1500 COMPLEX PO) Take 1 tablet by mouth daily.   Yes [provider]  levothyroxine (SYNTHROID) 75 MCG tablet Take 75 mcg by mouth daily before breakfast.   Yes [provider]  Magnesium 250  MG TABS Take 250 mg by mouth daily.   Yes [provider]  meloxicam (MOBIC) 7.5 MG tablet Take 7.5 mg by mouth daily. 07/28/21  Yes [provider]  Multiple Vitamins-Minerals (PRESERVISION AREDS 2+MULTI VIT PO) Take 1 tablet by mouth 2  (two) times daily. 11/12/19  Yes [provider]  Omega-3 Fatty Acids (OMEGA 3 500) 500 MG CAPS Take 500 mg by mouth daily.   Yes [provider]  COMIRNATY syringe  09/08/22   [provider]    Current Facility-Administered Medications  Medication Dose Route Frequency Provider Last Rate Last Admin   acetaminophen (TYLENOL) tablet 650 mg  650 mg Oral Q6H PRN Clydie Braun, MD       Or   acetaminophen (TYLENOL) suppository 650 mg  650 mg Rectal Q6H PRN Madelyn Flavors A, MD       albuterol (PROVENTIL) (2.5 MG/3ML) 0.083% nebulizer solution 2.5 mg  2.5 mg Nebulization Q6H PRN Katrinka Blazing, Rondell A, MD       atorvastatin (LIPITOR) tablet 40 mg  40 mg Oral QHS Smith, Rondell A, MD   40 mg at 10/31/23 2109   doxylamine (Sleep) (UNISOM) tablet 25 mg  25 mg Oral QHS PRN Madelyn Flavors A, MD       enoxaparin (LOVENOX) injection 40 mg  40 mg Subcutaneous Q24H Smith, Rondell A, MD   40 mg at 10/31/23 2111   folic acid (FOLVITE) tablet 1 mg  1 mg Oral Daily Smith, Rondell A, MD   1 mg at 10/31/23 0815   hydrALAZINE (APRESOLINE) injection 10 mg  10 mg Intravenous Q4H PRN Madelyn Flavors A, MD       HYDROcodone-acetaminophen (NORCO/VICODIN) 5-325 MG per tablet 1-2 tablet  1-2 tablet Oral Q4H PRN Glade Lloyd, MD   2 tablet at 11/01/23 0617   levothyroxine (SYNTHROID) tablet 75 mcg  75 mcg Oral Q0600 Madelyn Flavors A, MD   75 mcg at 11/01/23 0618   LORazepam (ATIVAN) tablet 1-4 mg  1-4 mg Oral Q1H PRN Madelyn Flavors A, MD       Or   LORazepam (ATIVAN) injection 1-4 mg  1-4 mg Intravenous Q1H PRN Katrinka Blazing, Rondell A, MD       morphine (PF) 2 MG/ML injection 2 mg  2 mg Intravenous Q2H PRN Madelyn Flavors A, MD   2 mg at 10/31/23 1156   multivitamin with minerals tablet 1 tablet  1 tablet Oral Daily Madelyn Flavors A, MD   1 tablet at 10/31/23 0815   polyethylene glycol-electrolytes (NuLYTELY) solution 4,000 mL  4,000 mL Oral Once Kerin Salen, MD       sodium chloride flush (NS) 0.9 %  injection 3 mL  3 mL Intravenous Q12H Smith, Rondell A, MD   3 mL at 10/31/23 2111   thiamine (VITAMIN B1) tablet 100 mg  100 mg Oral Daily Madelyn Flavors A, MD   100 mg at 10/31/23 2952   Or   thiamine (VITAMIN B1) injection 100 mg  100 mg Intravenous Daily Clydie Braun, MD        Allergies as of 10/30/2023   (No Known Allergies)    Family History  Problem Relation Age of Onset   Lung cancer Mother    Stroke Mother 13   Alzheimer's disease Father    CAD Father        95% occlusion of L Main & RCA, severe descending aorta, arterial & arteriolonephrosclerosis    Cholecystitis Father    Esophagitis  Father    Pancreatic cancer Daughter    BRCA 1/2 Neg Hx    Breast cancer Neg Hx    Colon cancer Neg Hx    Ovarian cancer Neg Hx    Endometrial cancer Neg Hx    Prostate cancer Neg Hx     Social History   Socioeconomic History   Marital status: Widowed    Spouse name: Not on file   Number of children: 2   Years of education: Not on file   Highest education level: Not on file  Occupational History   Not on file  Tobacco Use   Smoking status: Former    Current packs/day: 0.00    Types: Cigarettes    Quit date: 05/15/1988    Years since quitting: 35.4   Smokeless tobacco: Never  Vaping Use   Vaping status: Never Used  Substance and Sexual Activity   Alcohol use: Yes    Alcohol/week: 14.0 standard drinks of alcohol    Types: 14 drink(s) per week    Comment: white wine   Drug use: No   Sexual activity: Not Currently    Partners: Male  Other Topics Concern   Not on file  Social History Narrative   Not on file   Social Determinants of Health   Financial Resource Strain: Not on file  Food Insecurity: No Food Insecurity (10/30/2023)   Hunger Vital Sign    Worried About Running Out of Food in the Last Year: Never true    Ran Out of Food in the Last Year: Never true  Transportation Needs: No Transportation Needs (10/30/2023)   PRAPARE - Scientist, research (physical sciences) (Medical): No    Lack of Transportation (Non-Medical): No  Physical Activity: Not on file  Stress: Not on file  Social Connections: Not on file  Intimate Partner Violence: Not At Risk (10/30/2023)   Humiliation, Afraid, Rape, and Kick questionnaire    Fear of Current or Ex-Partner: No    Emotionally Abused: No    Physically Abused: No    Sexually Abused: No    Review of Systems: As per HPI Physical Exam: Vital signs in last 24 hours: Temp:  [97.7 F (36.5 C)-98.7 F (37.1 C)] 98.6 F (37 C) (11/20 0619) Pulse Rate:  [61-71] 61 (11/20 0619) Resp:  [16-19] 16 (11/20 0619) BP: (109-152)/(74-98) 152/98 (11/20 0619) SpO2:  [95 %-96 %] 95 % (11/20 0619)    General:   Alert,  Well-developed, well-nourished, pleasant and cooperative in NAD Head:  Normocephalic and atraumatic. Eyes:  Sclera clear, no icterus.   Conjunctiva pink. Ears:  Normal auditory acuity. Nose:  No deformity, discharge,  or lesions. Mouth:  No deformity or lesions.  Oropharynx pink & moist. Neck:  Supple; no masses or thyromegaly. Lungs:  Clear throughout to auscultation.   No wheezes, crackles, or rhonchi. No acute distress. Heart:  Regular rate and rhythm; no murmurs, clicks, rubs,  or gallops. Extremities:  Without clubbing or edema. Neurologic:  Alert and  oriented x4;  grossly normal neurologically. Skin:  Intact without significant lesions or rashes. Psych:  Alert and cooperative. Normal mood and affect. Abdomen: Soft mass felt in lower pelvis        Lab Results: Recent Labs    10/30/23 0739 10/31/23 0740 11/01/23 0527  WBC 11.3* 9.6 6.7  HGB 14.9 14.5 14.2  HCT 47.0* 45.7 43.9  PLT 184 197 163   BMET Recent Labs    10/30/23 0739 10/31/23 0740  11/01/23 0527  NA 137 137 138  K 3.8 4.2 3.8  CL 102 102 102  CO2 25 26 29   GLUCOSE 145* 94 104*  BUN 18 17 12   CREATININE 0.66 0.64 0.62  CALCIUM 9.6 9.0 8.9   LFT Recent Labs    10/30/23 0739  PROT 6.3*  ALBUMIN 3.8   AST 29  ALT 16  ALKPHOS 66  BILITOT 0.9   PT/INR Recent Labs    11/01/23 0527  LABPROT 14.3  INR 1.1    Studies/Results: MR ABDOMEN W WO CONTRAST  Result Date: 10/31/2023 CLINICAL DATA:  Pelvic mass.  Liver lesion EXAM: MRI ABDOMEN WITHOUT AND WITH CONTRAST TECHNIQUE: Multiplanar multisequence MR imaging of the abdomen was performed both before and after the administration of intravenous contrast. CONTRAST:  5mL GADAVIST GADOBUTROL 1 MMOL/ML IV SOLN COMPARISON:  CT 10/30/2023 and older. FINDINGS: Lower chest: Trace pleural fluid. Hepatobiliary: Numerous bright T2, low T1 nonenhancing foci identified consistent with benign cystic lesions. Many of these are under 15 mm. There are some larger foci identified such as segment 4 measuring 4.3 cm and caudate measuring 2.8 cm. Prior CT did demonstrate 1 lesion which is more complex anteriorly in the left hepatic lobe, segment 3 which on today's examination when taking into account motion show some progressive enhancement, is bright on T2 but not as bright as simple fluid and consistent with a small hemangioma. No specific aggressive liver lesion clearly identified today. Patent portal vein. Gallbladder is nondilated. No biliary ductal dilatation. Pancreas: Ectatic pancreatic duct diffusely measuring up to 6 mm, severe. No pancreatic focal atrophy, abnormal enhancement or abnormal T1 signal. No restricted diffusion along the pancreas. Spleen:  Within normal limits in size and appearance. Adrenals/Urinary Tract: Adrenal glands are preserved. No enhancing renal mass or collecting system dilatation. Stomach/Bowel: Visualized bowel is nondilated. This includes visualized portions of the small and large bowel. The stomach is underdistended. Vascular/Lymphatic: Normal caliber aorta and IVC. Atherosclerotic changes along the aorta. Circumaortic left renal vein. Once again there is a abnormal lymph node identified anterior to the aorta in the upper abdomen on  series 1602, image 58 measuring 2.3 x 1.8 cm. Few other small retroperitoneal nodes identified. Other:  Trace ascites.  Mesenteric stranding. Musculoskeletal: Curvature and degenerative changes along the spine. IMPRESSION: Multiple benign-appearing liver lesions including cysts and 1 hemangioma. Persistent enlarged upper abdominal retroperitoneal lymph node. Additional smaller but prominent nodes as well. With the pelvic findings these are worrisome for potential spread of neoplasm. Mild ascites. Electronically Signed   By: Karen Kays M.D.   On: 10/31/2023 14:10   MR PELVIS W WO CONTRAST  Result Date: 10/31/2023 CLINICAL DATA:  Pelvic mass EXAM: MRI PELVIS WITHOUT AND WITH CONTRAST TECHNIQUE: Multiplanar multisequence MR imaging of the pelvis was performed both before and after administration of intravenous contrast. CONTRAST:  5mL GADAVIST GADOBUTROL 1 MMOL/ML IV SOLN COMPARISON:  CT 10/30/2019 FINDINGS: Urinary Tract: Bladder is mildly distended with fluid. There is some mass effect along the posterior aspect of the bladder related to the adjacent complex mass. The bladder itself appears grossly intact. No abnormal wall enhancement. Grossly preserved course of the urethra. Bowel: The visualized bowel in the pelvis is nondilated. However there is lobular masslike area along the distal sigmoid colon with the areas of heterogeneous enhancement. The dominant lesion in this location on series 4, image 29 measures 3.7 by 2.6 cm. There are several adjacent soft tissue nodules as well such as just posterior to the cervix  and anterior to the bowel measuring 10 mm on series 22 image 30. Focus superior left lateral series 22, image 26 measures 2.3 x 1.7 cm. Additional foci elsewhere dependently in the pelvis including the presacral space. Vascular/Lymphatic: Atherosclerotic changes identified along the iliac vessels. No separate nodal enlargement identified. Reproductive: Uterus measures 8.2 by 2.0 by 3.4 cm.  Endometrial stripe is less than 3 mm. Slightly heterogeneous myometrium. Anterior to the uterus is a large complex cystic and solid mass with heterogeneous enhancement of the solid component. Lesion measures 11.5 by 7.6 by 7.4 cm. The more solid component is right lateral inferior with a aggressive in enhancement measuring proximally 7.7 by 5.6 cm. Cephalocaudal length 8.5 cm. The cystic component more towards the left has a dimensions approaching 8.7 cm. There is surrounding free fluid and edema. Although this more in the central pelvis towards midline a right ovary is not seen as a separate structure. There is what appears to be a small postmenopausal of the left ovary measuring 15 mm. An ovarian neoplasm is a strong consideration. Other:  Small amount of free fluid in the pelvis.  Edema. Musculoskeletal: Curvature of the spine. Moderate degenerative changes of the lumbar spine with disc bulging and areas of stenosis greatest at L4-5. Degenerative changes of the pelvis and hips as well. Study is somewhat limited due to some artifacts postcontrast axial dataset as well as study being performed as a standard pelvis rather than a gynecologic pelvis exam. Please see separate dictation of abdomen MRI. IMPRESSION: Large complex cystic and solid pelvic mass centrally measuring up to 11.5 x 7.6 x 7.4 cm. Based on overall appearance this has worrisome for a neoplasm including an ovarian malignancy. Small amount of ascites in the pelvis. Soft tissue enhancing aggressive nodules along the course of the sigmoid colon as well as in the adjacent fat and presacral spaces. With the larger central mass this very well could be spread of disease to adjacent structures rather than a primary colonic process but correlate with symptoms and if needed colonoscopy. Please see separate dictation of abdominal MRI. Electronically Signed   By: Karen Kays M.D.   On: 10/31/2023 14:01   CT CHEST W CONTRAST  Result Date:  10/31/2023 CLINICAL DATA:  77 year old female with suspected gynecologic malignancy. * Tracking Code: BO * EXAM: CT CHEST WITH CONTRAST TECHNIQUE: Multidetector CT imaging of the chest was performed during intravenous contrast administration. RADIATION DOSE REDUCTION: This exam was performed according to the departmental dose-optimization program which includes automated exposure control, adjustment of the mA and/or kV according to patient size and/or use of iterative reconstruction technique. CONTRAST:  75mL OMNIPAQUE IOHEXOL 350 MG/ML SOLN COMPARISON:  None Available. FINDINGS: Cardiovascular: The heart size is mildly enlarged. No pericardial effusion. Aortic atherosclerosis and coronary artery calcification. Mediastinum/Nodes: Trachea and esophagus appear unremarkable. The right lobe of thyroid gland appears surgically absent. No mediastinal or hilar adenopathy. Lungs/Pleura: No pleural effusion identified. Subsegmental atelectasis identified within the lingula and bilateral posterior lung bases. No signs of interstitial edema or airspace consolidation. No suspicious pulmonary nodule identified to suggest lung metastases. Upper Abdomen: No acute abnormality. Multiple liver cysts. Enlarged lymph node within the portal caval region measures 1.5 cm, image 155/3. Defer to report from CT AP dated 10/30/2019 for and same-day MRI of the abdomen pelvis for further details. Musculoskeletal: Mild curvature of the thoracic spine and lumbar spine is convex towards the left. Multilevel degenerative disc disease. No acute or suspicious osseous lesions. IMPRESSION: 1. No signs of  metastatic disease to the chest. 2. Areas of subsegmental atelectasis noted within bilateral posterior lung bases and lingula. 3. Enlarged upper abdominal lymph node as above. In the setting of a known malignancy this is concerning for nodal metastasis. 4. Coronary artery calcifications. 5.  Aortic Atherosclerosis (ICD10-I70.0). Electronically Signed    By: Signa Kell M.D.   On: 10/31/2023 05:57   CT ABDOMEN PELVIS W CONTRAST  Result Date: 10/30/2023 CLINICAL DATA:  Abdominal pain. EXAM: CT ABDOMEN AND PELVIS WITH CONTRAST TECHNIQUE: Multidetector CT imaging of the abdomen and pelvis was performed using the standard protocol following bolus administration of intravenous contrast. RADIATION DOSE REDUCTION: This exam was performed according to the departmental dose-optimization program which includes automated exposure control, adjustment of the mA and/or kV according to patient size and/or use of iterative reconstruction technique. CONTRAST:  75mL OMNIPAQUE IOHEXOL 350 MG/ML SOLN COMPARISON:  Limb 1424 FINDINGS: Lower chest: No acute findings. Hepatobiliary: Multiple hepatic cysts evident. Scattered tiny hypodensities in the liver parenchyma are too small to characterize but are statistically most likely benign. No followup imaging is recommended. Tiny subcapsular lesion measured previously at 9 mm in the anterior left liver is stable on image 23/3 today, nonspecific. There is no evidence for gallstones, gallbladder wall thickening, or pericholecystic fluid. No intrahepatic or extrahepatic biliary dilation. Pancreas: Dilatation of the pancreatic duct to the head and body of pancreas is similar to prior. Spleen: No splenomegaly. No suspicious focal mass lesion. Adrenals/Urinary Tract: No adrenal nodule or mass. Kidneys unremarkable. No evidence for hydroureter. Bladder is distended. Stomach/Bowel: Stomach is unremarkable. No gastric wall thickening. No evidence of outlet obstruction. Duodenum is normally positioned as is the ligament of Treitz. No small bowel wall thickening. No small bowel dilatation. Diverticular changes are noted in the left colon without evidence of diverticulitis. Vascular/Lymphatic: 16 mm short axis portal caval lymph node seen on 22/3. No para-aortic lymphadenopathy. No pelvic sidewall lymphadenopathy. Reproductive: Multiple uterine  fibroids evident. As noted on prior study there is an area of the anterior cervix that appears to obliterate the fat plane between the cervix and the posterior wall of the bladder (see sagittal 86/7). Small soft tissue nodules are again noted in the cul-de-sac some of which may pertain to diverticuli, but others raise concern for peritoneal nodularity (see images 61 and 56 of series 3). Other: No substantial free fluid. Musculoskeletal: No worrisome lytic or sclerotic osseous abnormality. IMPRESSION: 1. Multiple uterine fibroids. As noted on prior study there is an area of the anterior cervix that appears to obliterate the fat plane between the cervix and the posterior wall of the bladder. This is concerning for a cervical mass. Gynecologic consultation recommended. 2. Small soft tissue nodules in the cul-de-sac some of which may relate to diverticuli, but others raise concern for peritoneal nodularity. Attention on follow-up recommended. PET-CT may prove helpful to further evaluate 3. 16 mm short axis portal caval lymph node, metastatic disease not excluded. 4. Left colonic diverticulosis without diverticulitis. Electronically Signed   By: Kennith Center M.D.   On: 10/30/2023 13:08    Impression: Large pelvic mass suspect GYN malignancy Endometrial/cervical biopsies obtained yesterday Patient scheduled for Port-A-Cath placement today Colonoscopy recommended for further evaluation  Plan: Clear liquid diet after Port-A-Cath placement. Colonoscopy prep hopefully can be started at 2 PM today. Diagnostic colonoscopy tomorrow. The risks and the benefits of the procedure were discussed with the patient in details. She understands and verbalizes consent.   LOS: 1 day   Kerin Salen, MD  11/01/2023, 7:54 AM

## 2023-11-02 ENCOUNTER — Inpatient Hospital Stay (HOSPITAL_COMMUNITY): Payer: Self-pay | Admitting: Anesthesiology

## 2023-11-02 ENCOUNTER — Encounter (HOSPITAL_COMMUNITY): Payer: Self-pay | Admitting: Internal Medicine

## 2023-11-02 ENCOUNTER — Encounter (HOSPITAL_COMMUNITY)
Admission: EM | Disposition: A | Discharge status: Home / Self Care | Payer: Self-pay | Source: Home / Self Care | Attending: Internal Medicine

## 2023-11-02 ENCOUNTER — Inpatient Hospital Stay (HOSPITAL_COMMUNITY): Payer: Medicare HMO | Admitting: Anesthesiology

## 2023-11-02 DIAGNOSIS — R933 Abnormal findings on diagnostic imaging of other parts of digestive tract: Secondary | ICD-10-CM

## 2023-11-02 DIAGNOSIS — K5730 Diverticulosis of large intestine without perforation or abscess without bleeding: Secondary | ICD-10-CM | POA: Diagnosis not present

## 2023-11-02 DIAGNOSIS — R1900 Intra-abdominal and pelvic swelling, mass and lump, unspecified site: Secondary | ICD-10-CM | POA: Diagnosis not present

## 2023-11-02 HISTORY — PX: FLEXIBLE SIGMOIDOSCOPY: SHX5431

## 2023-11-02 SURGERY — SIGMOIDOSCOPY, FLEXIBLE
Anesthesia: Monitor Anesthesia Care

## 2023-11-02 MED ORDER — SENNOSIDES-DOCUSATE SODIUM 8.6-50 MG PO TABS
1.0000 | ORAL_TABLET | Freq: Two times a day (BID) | ORAL | Status: DC
  Administered 2023-11-02 – 2023-11-03 (×3): 1 via ORAL
  Filled 2023-11-02 (×3): qty 1

## 2023-11-02 MED ORDER — POLYETHYLENE GLYCOL 3350 17 G PO PACK: 17.0000 g | PACK | Freq: Every day | ORAL | Status: DC | PRN

## 2023-11-02 MED ORDER — PROPOFOL 10 MG/ML IV BOLUS
INTRAVENOUS | Status: DC | PRN
  Administered 2023-11-02: 30 mg via INTRAVENOUS
  Administered 2023-11-02: 20 mg via INTRAVENOUS
  Administered 2023-11-02: 30 mg via INTRAVENOUS
  Administered 2023-11-02: 20 mg via INTRAVENOUS
  Administered 2023-11-02: 50 mg via INTRAVENOUS

## 2023-11-02 MED ORDER — PHENYLEPHRINE 80 MCG/ML (10ML) SYRINGE FOR IV PUSH (FOR BLOOD PRESSURE SUPPORT)
PREFILLED_SYRINGE | INTRAVENOUS | Status: DC | PRN
Start: 2023-11-02 — End: 2023-11-02
  Administered 2023-11-02: 160 ug via INTRAVENOUS

## 2023-11-02 MED ORDER — LIDOCAINE 2% (20 MG/ML) 5 ML SYRINGE
INTRAMUSCULAR | Status: DC | PRN
  Administered 2023-11-02: 60 mg via INTRAVENOUS

## 2023-11-02 MED ORDER — PROPOFOL 500 MG/50ML IV EMUL
INTRAVENOUS | Status: DC | PRN
  Administered 2023-11-02: 50 ug/kg/min via INTRAVENOUS

## 2023-11-02 SURGICAL SUPPLY — 20 items
ELECT REM PT RETURN 9FT ADLT (ELECTROSURGICAL)
ELECTRODE REM PT RTRN 9FT ADLT (ELECTROSURGICAL) IMPLANT
FCP BXJMBJMB 240X2.8X (CUTTING FORCEPS)
FLOOR PAD 36X40 (MISCELLANEOUS) ×1
FORCEPS BIOP RAD 4 LRG CAP 4 (CUTTING FORCEPS) IMPLANT
FORCEPS BXJMBJMB 240X2.8X (CUTTING FORCEPS) IMPLANT
INJECTOR/SNARE I SNARE (MISCELLANEOUS) IMPLANT
LUBRICANT JELLY 4.5OZ STERILE (MISCELLANEOUS) IMPLANT
MANIFOLD NEPTUNE II (INSTRUMENTS) IMPLANT
NDL SCLEROTHERAPY 25GX240 (NEEDLE) IMPLANT
NEEDLE SCLEROTHERAPY 25GX240 (NEEDLE) IMPLANT
PAD FLOOR 36X40 (MISCELLANEOUS) ×1 IMPLANT
PROBE APC STR FIRE (PROBE) IMPLANT
PROBE INJECTION GOLD 7FR (MISCELLANEOUS) IMPLANT
SNARE ROTATE MED OVAL 20MM (MISCELLANEOUS) IMPLANT
SYR 50ML LL SCALE MARK (SYRINGE) IMPLANT
TRAP SPECIMEN MUCOUS 40CC (MISCELLANEOUS) IMPLANT
TUBING ENDO SMARTCAP PENTAX (MISCELLANEOUS) IMPLANT
TUBING IRRIGATION ENDOGATOR (MISCELLANEOUS) ×1 IMPLANT
WATER STERILE IRR 1000ML POUR (IV SOLUTION) IMPLANT

## 2023-11-02 NOTE — Plan of Care (Signed)
  Problem: Education: Goal: Knowledge of General Education information will improve Description: Including pain rating scale, medication(s)/side effects and non-pharmacologic comfort measures Outcome: Progressing   Problem: Pain Management: Goal: General experience of comfort will improve Outcome: Progressing   Problem: Coping: Goal: Level of anxiety will decrease Outcome: Progressing

## 2023-11-02 NOTE — Plan of Care (Signed)
Patient ID: Verl Blalock, female   DOB: Dec 01, 1946, 77 y.o.   MRN: [id number]  Problem: Education: Goal: Knowledge of General Education information will improve Description: Pain scale 0-10, Tylenol pain level 1-3, Norco pain level 4-6 q 4 hours, Morphine 2 mg IV q 2 hours pain level 7-10 Outcome: Progressing   Problem: Health Behavior/Discharge Planning: Goal: Ability to manage health-related needs will improve Outcome: Progressing  Son at bedside helping with reinforcement   Problem: Clinical Measurements: Goal: Ability to maintain clinical measurements within normal limits will improve Outcome: Progressing Goal: Will remain free from infection Outcome: Progressing CHG bath Goal: Diagnostic test results will improve Outcome: Progressing Order for CT guided biopsy 11/22 Goal: Cardiovascular complication will be avoided Outcome: Progressing Monitoring VS BP (!) 167/80   Pulse 70   Temp (!) 97.2 F (36.2 C) (Tympanic)   Resp 17   Ht 5\' 1"  (1.549 m)   Wt 49.4 kg   SpO2 98%   BMI 20.60 kg/m     Problem: Activity: Goal: Risk for activity intolerance will decrease Outcome: Progressing  Encouraging mobility   Problem: Coping: Goal: Level of anxiety will decrease Outcome: Progressing   Problem: Elimination: Goal: Will not experience complications related to bowel motility Outcome: Progressing Encouraging fluids and mobility Goal: Will not experience complications related to urinary retention Outcome: Progressing   Problem: Pain Management: Goal: General experience of comfort will improve Outcome: Progressing   Problem: Safety: Goal: Ability to remain free from injury will improve Outcome: Progressing   Problem: Skin Integrity: Goal: Risk for impaired skin integrity will decrease Outcome: Progressing   Problem: Activity: Goal: Ability to implement measures to reduce episodes of fatigue will improve Outcome: Progressing   Problem: Bowel/Gastric: Goal:  Will not experience complications related to bowel motility Outcome: Progressing   Problem: Coping: Goal: Ability to identify and develop effective coping behavior will improve Outcome: Progressing    Lidia Collum, RN

## 2023-11-02 NOTE — Progress Notes (Signed)
PROGRESS NOTE    Lori Conrad  WUJ:[id number] DOB: 05-05-1946 DOA: 10/30/2023 PCP: Henrine Screws, MD    Brief Narrative:   Lori Conrad is a 77 y.o. female with past medical history significant for hypothyroidism, hyperlipidemia who presented to [facility] ED on 11/18 with complaints of abdominal pain that started around 5:30 AM.  Patient had her annual physical exam with her primary care provider  where he had felt a possible mass on her abdomen at the end of last month.  She had ultrasound which a showed solid mass extending into the lumen of the bladder on the righ on 10/29.  Thereafter a CT scan of the abdomen pelvis have been ordered for further evaluation.  CT scan of the abdomen pelvis from 11/14 which noted 11 x 9 cm heterogeneous solid and cystic mass arising from the uterine fundus invading the urinary bladder dome wall as well as the distal sigmoid colon without signs of obstruction.  At that time patient had not reported any complaints of abdominal pain.  She was scheduled to follow-up with Dr. Alvester Morin this morning at 12:45 PM.  However this morning had awoken to severe pain in the right lower quadrant of her abdomen. Pain was sharp. Noted associated nausea, but had not had any vomiting.  Patient words after the CT scan she had a large bowel movement, but had not had any since.  Denies any prior history of abdominal surgeries or known family history for ovarian or uterine cancer.  Denies having any significant fever, cough, shortness of breath, dysuria, or blood in stool/urine.  Her weight has been stable at around 110 pounds.   In the emergency department patient was noted to be afebrile with stable vital signs.  Labs significant for WBC 11.3, glucose 145, and lipase 56.  Urinalysis did not note significant signs for infection.  CT scan of the abdomen pelvis noted multiple uterine fibroids with a area of the anterior cervix that appears to obliterate the fat plane between the cervix and  the posterior wall of the bladder that is concerning for cervical mass, soft tissue nodules in the cul-de-sac concerning for peritoneal nodularity, and 16 mm short axis portal caval lymph node for which metastatic disease was not ruled out.  Dr. Alvester Morin of gynecology oncology have been consulted.  Patient had been given 1 L normal saline IV fluids, morphine IV, and Dilaudid.   Assessment & Plan:   Complex pelvic mass concerning for malignancy Patient presenting to ED with abdominal pain, localized to the right lower quadrant.  CT abdomen/pelvis with multiple uterine fibroids, area of anterior cervix that appears to obliterate the fat plane between the cervix and posterior wall of the bladder concerning for cervical mass, soft tissue nodules in the cul-de-sac concerning for peritoneal nodularity and 16 mm short axis portal lymph node concerning for metastatic disease.  MR pelvis with large complex cystic and solid pelvic mass centrally measuring up to 11.5 x 11.6 x 11.4 cm, concerning for ovarian malignancy, small amount of ascites, soft tissue enhancing aggressive nodules along the course of the sigmoid colon as well as adjacent fat and presacral spaces.  Underwent IR port placement on 11/01/2023.  CEA 3.5 within normal limits, CA125 elevated at 401.0, CA 19-9 11, within normal limits.  Underwent colonoscopy by GI on 11/02/2023 preparation poor, noted diverticulosis, skin tags, stool noted throughout with no obvious sigmoid extension of pelvic mass seen; no biopsies taken.  IR evaluated for possible percutaneous sampling of pelvic mass,  unfortunately no approachable window per Dr. Lowella Dandy.  -- [facility] gastroenterology, medical oncology, and GYN-Onc following, appreciate assistance -- Await further recommendations from medical/Gyn oncology as patient will likely need laparoscopic approach for biopsy  Hypothyroidism -- Levothyroxine 75 mcg p.o. daily  Hyperlipidemia -- Atorvastatin 40 mg p.o. 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  Continue outpatient follow-up/surveillance.   DVT prophylaxis: enoxaparin (LOVENOX) injection 40 mg Start: 10/30/23 2200    Code Status: Full Code Family Communication: Updated patient's son present at bedside this morning  Disposition Plan:  Level of care: Med-Surg Status is: Inpatient Remains inpatient appropriate because: Pending colonoscopy    Consultants:  GYN-Onc Medical oncology [facility] gastroenterology Interventional radiology  Procedures:  Single-lumen PowerPort placement, interventional radiology, 11/01/2023  Antimicrobials:  None   Subjective: Patient seen examined bedside, resting calmly.  Lying in bed.  Son present at bedside.  Just returned from colonoscopy, sleeping but arousable.  No biopsies taken as no sigmoid extension seen from pelvic mass.  Discussed with interventional radiology, no percutaneous approach available given anatomy per Dr. Lowella Dandy and recommended laparoscopic biopsy by GYN.  No other specific questions or concerns at this time.  Denies headache, no visual changes, no chest pain, no palpitations, no shortness of breath, no abdominal pain, no fever/chills/night sweats, no nausea/vomiting/diarrhea, no focal weakness, no fatigue, no paresthesias.  No acute events overnight per nursing staff.  Objective: Vitals:   11/02/23 0917 11/02/23 0927 11/02/23 0937 11/02/23 1037  BP: (!) 125/100 (!) 166/80 (!) 160/90 (!) 167/80  Pulse: 67 63 71 70  Resp: 16 14 17    Temp: (!) 97.2 F (36.2 C)     TempSrc: Tympanic     SpO2: 98% 95% 98%   Weight:      Height:        Intake/Output Summary (Last 24 hours) at 11/02/2023 1323 Last data filed at 11/02/2023 1208 Gross per 24 hour  Intake 453 ml  Output --  Net 453 ml   Filed Weights   10/30/23 0731 10/30/23 0958 11/02/23 0754  Weight: 51.5 kg 49.4 kg 49.4 kg    Examination:  Physical Exam: GEN:  NAD, alert and oriented x 3, wd/wn HEENT: NCAT, PERRL, EOMI, sclera clear, MMM PULM: CTAB w/o wheezes/crackles, normal respiratory effort, on room air CV: RRR w/o M/G/R GI: abd soft, + soft mass right lower pelvis, + BS MSK: no peripheral edema, muscle strength globally intact 5/5 bilateral upper/lower extremities NEURO: CN II-XII intact, no focal deficits, sensation to light touch intact PSYCH: normal mood/affect Integumentary: dry/intact, no rashes or wounds    Data Reviewed: I have personally reviewed following labs and imaging studies  CBC: Recent Labs  Lab 10/30/23 0739 10/31/23 0740 11/01/23 0527  WBC 11.3* 9.6 6.7  NEUTROABS  --   --  4.8  HGB 14.9 14.5 14.2  HCT 47.0* 45.7 43.9  MCV 91.4 91.6 90.0  PLT 184 197 163   Basic Metabolic Panel: Recent Labs  Lab 10/30/23 0739 10/31/23 0740 11/01/23 0527  NA 137 137 138  K 3.8 4.2 3.8  CL 102 102 102  CO2 25 26 29   GLUCOSE 145* 94 104*  BUN 18 17 12   CREATININE 0.66 0.64 0.62  CALCIUM 9.6 9.0 8.9  MG  --   --  2.0   GFR: Estimated Creatinine Clearance: 43.7 mL/min (by C-G formula based on SCr of 0.62 mg/dL). Liver Function Tests: Recent Labs  Lab 10/30/23  0739  AST 29  ALT 16  ALKPHOS 66  BILITOT 0.9  PROT 6.3*  ALBUMIN 3.8   Recent Labs  Lab 10/30/23 0739  LIPASE 56*   No results for input(s): "AMMONIA" in the last 168 hours. Coagulation Profile: Recent Labs  Lab 11/01/23 0527  INR 1.1   Cardiac Enzymes: No results for input(s): "CKTOTAL", "CKMB", "CKMBINDEX", "TROPONINI" in the last 168 hours. BNP (last 3 results) No results for input(s): "PROBNP" in the last 8760 hours. HbA1C: No results for input(s): "HGBA1C" in the last 72 hours. CBG: No results for input(s): "GLUCAP" in the last 168 hours. Lipid Profile: No results for input(s): "CHOL", "HDL", "LDLCALC", "TRIG", "CHOLHDL", "LDLDIRECT" in the last 72 hours. Thyroid Function Tests: No results for input(s): "TSH", "T4TOTAL", "FREET4",  "T3FREE", "THYROIDAB" in the last 72 hours. Anemia Panel: No results for input(s): "VITAMINB12", "FOLATE", "FERRITIN", "TIBC", "IRON", "RETICCTPCT" in the last 72 hours. Sepsis Labs: No results for input(s): "PROCALCITON", "LATICACIDVEN" in the last 168 hours.  No results found for this or any previous visit (from the past 240 hour(s)).       Radiology Studies: IR IMAGING GUIDED PORT INSERTION  Result Date: 11/01/2023 INDICATION: 77 year old female with suspected gynecologic malignancy requiring central venous access for chemotherapy. EXAM: IMPLANTED PORT A CATH PLACEMENT WITH ULTRASOUND AND FLUOROSCOPIC GUIDANCE COMPARISON:  None Available. MEDICATIONS: None. ANESTHESIA/SEDATION: Moderate (conscious) sedation was employed during this procedure. A total of Versed 1 mg and Fentanyl 50 mcg was administered intravenously. Moderate Sedation Time: 15 minutes. The patient's level of consciousness and vital signs were monitored continuously by radiology nursing throughout the procedure under my direct supervision. CONTRAST:  None FLUOROSCOPY TIME:  One mGy COMPLICATIONS: None immediate. PROCEDURE: The procedure, risks, benefits, and alternatives were explained to the patient. Questions regarding the procedure were encouraged and answered. The patient understands and consents to the procedure. The right neck and chest were prepped with chlorhexidine in a sterile fashion, and a sterile drape was applied covering the operative field. Maximum barrier sterile technique with sterile gowns and gloves were used for the procedure. A timeout was performed prior to the initiation of the procedure. Ultrasound was used to examine the jugular vein which was compressible and free of internal echoes. A skin marker was used to demarcate the planned venotomy and port pocket incision sites. Local anesthesia was provided to these sites and the subcutaneous tunnel track with 1% lidocaine with 1:100,000 epinephrine. A small  incision was created at the jugular access site and blunt dissection was performed of the subcutaneous tissues. Under ultrasound guidance, the jugular vein was accessed with a 21 ga micropuncture needle and an 0.018" wire was inserted to the superior vena cava. Real-time ultrasound guidance was utilized for vascular access including the acquisition of a permanent ultrasound image documenting patency of the accessed vessel. A 5 Fr micopuncture set was then used, through which a 0.035" Rosen wire was passed under fluoroscopic guidance into the inferior vena cava. An 8 Fr dilator was then placed over the wire. A subcutaneous port pocket was then created along the upper chest wall utilizing a combination of sharp and blunt dissection. The pocket was irrigated with sterile saline, packed with gauze, and observed for hemorrhage. A single lumen plastic power injectable port was chosen for placement. The 8 Fr catheter was tunneled from the port pocket site to the venotomy incision. The port was placed in the pocket. The external catheter was trimmed to appropriate length. The dilator was exchanged for an 8  Fr peel-away sheath under fluoroscopic guidance. The catheter was then placed through the sheath and the sheath was removed. Final catheter positioning was confirmed and documented with a fluoroscopic spot radiograph. The port was accessed with a Huber needle, aspirated, and flushed with heparinized saline. The deep dermal layer of the port pocket incision was closed with interrupted 3-0 Vicryl suture. Dermabond was then placed over the port pocket and neck incisions. The patient tolerated the procedure well without immediate post procedural complication. FINDINGS: After catheter placement, the tip lies within the superior cavoatrial junction. The catheter aspirates and flushes normally and is ready for immediate use. IMPRESSION: Successful placement of a power injectable Port-A-Cath via the right internal jugular vein.  The catheter is ready for immediate use. Marliss Coots, MD [facility] Radiology Electronically Signed   By: Marliss Coots M.D.   On: 11/01/2023 13:22        Scheduled Meds:  atorvastatin  40 mg Oral QHS   Chlorhexidine Gluconate Cloth  6 each Topical Daily   enoxaparin (LOVENOX) injection  40 mg Subcutaneous Q24H   folic acid  1 mg Oral Daily   levothyroxine  75 mcg Oral Q0600   multivitamin with minerals  1 tablet Oral Daily   sodium chloride flush  3 mL Intravenous Q12H   thiamine  100 mg Oral Daily   Or   thiamine  100 mg Intravenous Daily   Continuous Infusions:     LOS: 2 days    Time spent: 52 minutes spent on chart review, discussion with nursing staff, consultants, updating family and interview/physical exam; more than 50% of that time was spent in counseling and/or coordination of care.    Alvira Philips [place], DO [facility] Available via Epic secure chat 7am-7pm After these hours, please refer to coverage provider listed on [email or web address] 11/02/2023, 1:23 PM

## 2023-11-02 NOTE — Anesthesia Postprocedure Evaluation (Signed)
Anesthesia Post Note  Patient: Lori Conrad  Procedure(s) Performed: FLEXIBLE SIGMOIDOSCOPY     Patient location during evaluation: PACU Anesthesia Type: MAC Level of consciousness: awake and alert Pain management: pain level controlled Vital Signs Assessment: post-procedure vital signs reviewed and stable Respiratory status: spontaneous breathing, nonlabored ventilation and respiratory function stable Cardiovascular status: blood pressure returned to baseline Postop Assessment: no apparent nausea or vomiting Anesthetic complications: no   No notable events documented.  Last Vitals:  Vitals:   11/02/23 0927 11/02/23 0937  BP: (!) 166/80 (!) 160/90  Pulse: 63 71  Resp: 14 17  Temp:    SpO2: 95% 98%    Last Pain:  Vitals:   11/02/23 0937  TempSrc:   PainSc: 9                  Shanda Howells

## 2023-11-02 NOTE — Op Note (Signed)
[facility] Patient Name: Lori Conrad Procedure Date : 11/02/2023 MRN: [id number] Attending MD: Kerin Salen , MD, [id number] Date of Birth: [birth year] CSN: [id number] Age: 77 Admit Type: Inpatient Procedure:                Flexible Sigmoidoscopy Indications:              Abnormal CT of the GI tract, large pelvic mass                            ?ovarian origin with suspected sigmoid extension Providers:                Kerin Salen, MD, Stephens Shire RN, RN, Sunday Corn                            [place], Technician Referring MD:             [facility] Medicines:                Monitored Anesthesia Care Complications:            No immediate complications. Estimated Blood Loss:     Estimated blood loss: none. Procedure:                Pre-Anesthesia Assessment:                           - Prior to the procedure, a History and Physical                            was performed, and patient medications and                            allergies were reviewed. The patient's tolerance of                            previous anesthesia was also reviewed. The risks                            and benefits of the procedure and the sedation                            options and risks were discussed with the patient.                            All questions were answered, and informed consent                            was obtained. Prior Anticoagulants: The patient has                            taken no anticoagulant or antiplatelet agents. ASA                            Grade Assessment: III - A patient with severe  systemic disease. After reviewing the risks and                            benefits, the patient was deemed in satisfactory                            condition to undergo the procedure.                           After obtaining informed consent, the scope was                            passed under direct vision. The PCF-190TL ([id number])                             Olympus colonoscope was introduced through the anus                            and advanced to the the left transverse colon. The                            PCF-H190TL ([id number]) Olympus slim colonoscope was                            introduced through the anus and advanced to the.                            After obtaining informed consent, the scope was                            passed under direct vision. The flexible                            sigmoidoscopy was extremely difficult due to                            extrinsic compression and multiple diverticula in                            the colon. Successful completion of the procedure                            was aided by withdrawing the scope and replacing                            with the UltraSlim scope. The quality of the bowel                            preparation was poor. Scope In: 8:32:49 AM Scope Out: 9:07:36 AM Total Procedure Duration: 0 hours 34 minutes 47 seconds  Findings:      Skin tags were found on perianal exam.      Multiple large-mouthed diverticula were found in the sigmoid colon.      Significant luminal narrowing  noted.      I had to exchange the scope from pediatric colonoscope to an ultrathin       colonoscope for advancement of the scope beyond 30 cm.      Extensive amounts of liquid, semi-liquid, semi-solid and solid stool was       found in the rectum, in the recto-sigmoid colon, in the sigmoid colon,       in the descending colon, at the splenic flexure and in the transverse       colon, interfering with visualization. Impression:               - Preparation of the colon was poor.                           - Perianal skin tags found on perianal exam.                           - Diverticulosis in the sigmoid colon.                           - Stool in the rectum, in the recto-sigmoid colon,                            in the sigmoid colon, in the descending colon, at                             the splenic flexure and in the transverse colon.                           - No specimens collected. Moderate Sedation:      Patient did not receive moderate sedation for this procedure, but       instead received monitored anesthesia care. Recommendation:           - High fiber diet.                           - As no obvious sigmoid extension of pelvic mass                            was seen, further management as per GYN and                            oncology. Procedure Code(s):        --- Professional ---                           [phone], Sigmoidoscopy, flexible; diagnostic,                            including collection of specimen(s) by brushing or                            washing, when performed (separate procedure) Diagnosis Code(s):        --- Professional ---  K64.4, Residual hemorrhoidal skin tags                           K57.30, Diverticulosis of large intestine without                            perforation or abscess without bleeding                           R93.3, Abnormal findings on diagnostic imaging of                            other parts of digestive tract CPT copyright 2022 American Medical Association. All rights reserved. The codes documented in this report are preliminary and upon coder review may  be revised to meet current compliance requirements. Kerin Salen, MD 11/02/2023 9:29:18 AM This report has been signed electronically. Number of Addenda: 0

## 2023-11-02 NOTE — Interval H&P Note (Signed)
History and Physical Interval Note: 77/female with pelvic mass suspected colonic extension for colonoscopy with propofol.  11/02/2023 8:13 AM  Lori Conrad  has presented today for colonoscopy, with the diagnosis of Pelvis mass, concern for sigmoid extension.  The various methods of treatment have been discussed with the patient and family. After consideration of risks, benefits and other options for treatment, the patient has consented to  Procedure(s): COLONOSCOPY WITH PROPOFOL (N/A) as a surgical intervention.  The patient's history has been reviewed, patient examined, no change in status, stable for surgery.  I have reviewed the patient's chart and labs.  Questions were answered to the patient's satisfaction.     Kerin Salen

## 2023-11-02 NOTE — Transfer of Care (Signed)
Immediate Anesthesia Transfer of Care Note  Patient: Lori Conrad  Procedure(s) Performed: SIGMOIDOSCOPY  Patient Location: Endoscopy Unit  Anesthesia Type:MAC  Level of Consciousness: awake, alert , and oriented  Airway & Oxygen Therapy: Patient Spontanous Breathing and Patient connected to nasal cannula oxygen  Post-op Assessment: Report given to RN, Post -op Vital signs reviewed and stable, and Patient moving all extremities X 4  Post vital signs: Reviewed and stable  Last Vitals:  Vitals Value Taken Time  BP 125/100 11/02/23 0917  Temp 36.2 C 11/02/23 0917  Pulse 67 11/02/23 0917  Resp 16 11/02/23 0917  SpO2 98 % 11/02/23 0917    Last Pain:  Vitals:   11/02/23 0917  TempSrc: Tympanic  PainSc: Asleep      Patients Stated Pain Goal: 3 (11/01/23 2152)  Complications: No notable events documented.

## 2023-11-02 NOTE — Progress Notes (Signed)
   IR Note  Request received for pelvic mass biopsy in IR Imaging reviewed with Dr Lowella Dandy  NOT amenable to percutaneous biopsy-- Rec: consideration of GYN/ONC bx in OR

## 2023-11-03 ENCOUNTER — Telehealth: Payer: Self-pay | Admitting: Oncology

## 2023-11-03 ENCOUNTER — Inpatient Hospital Stay: Payer: Medicare HMO | Admitting: Gynecologic Oncology

## 2023-11-03 DIAGNOSIS — R1900 Intra-abdominal and pelvic swelling, mass and lump, unspecified site: Secondary | ICD-10-CM | POA: Diagnosis not present

## 2023-11-03 MED ORDER — SENNOSIDES-DOCUSATE SODIUM 8.6-50 MG PO TABS: 1.0000 | ORAL_TABLET | Freq: Two times a day (BID) | ORAL | 0 refills | Status: DC

## 2023-11-03 MED ORDER — POLYETHYLENE GLYCOL 3350 17 G PO PACK: 17.0000 g | PACK | Freq: Every day | ORAL | 0 refills | Status: DC | PRN

## 2023-11-03 MED ORDER — ONDANSETRON HCL 4 MG PO TABS: 4.0000 mg | ORAL_TABLET | Freq: Three times a day (TID) | ORAL | 0 refills | Status: DC | PRN

## 2023-11-03 MED ORDER — HYDROCODONE-ACETAMINOPHEN 5-325 MG PO TABS: 1.0000 | ORAL_TABLET | ORAL | 0 refills | Status: DC | PRN

## 2023-11-03 NOTE — Telephone Encounter (Signed)
Lori Conrad and discussed her procedure scheduled on 11/08/23 with Dr. Pricilla Holm. Also went over her appointment to see Dr. Alvester Morin on Monday, 11/06/23 at 3:15.  Her hospital pre surgical testing is scheduled for 11/07/23.  Gave her my contact information to call with any questions or needs.

## 2023-11-03 NOTE — Telephone Encounter (Signed)
Left a message regarding surgery scheduled for 11/08/23.  Requested a return call.

## 2023-11-03 NOTE — Discharge Summary (Signed)
Physician Discharge Summary  [name] ZOX:[id number] DOB: 10-Oct-1946 DOA: 10/30/2023  PCP: Henrine Screws, MD  Admit date: 10/30/2023 Discharge date: 11/03/2023  Admitted From: Home Disposition: Home  Recommendations for Outpatient Follow-up:  Follow up with PCP in 1-2 weeks Follow-up with GYN-Oncology, Dr. Alvester Morin this upcoming week for discussion on need for laparoscopic biopsy for complex pelvic mass Follow-up with medical oncology, Dr. Emeline Darling such Discharged on Norco 1-2 tablets every 4 hours as needed pain control Needs continued outpatient surveillance of abdominal aortic aneurysm   Home Health: No Equipment/Devices: None  Discharge Condition: Stable CODE STATUS: Full code Diet recommendation: Regular diet  History of present illness:  Lori Conrad is a 77 y.o. female with past medical history significant for hypothyroidism, hyperlipidemia who presented to [facility] ED on 11/18 with complaints of abdominal pain that started around 5:30 AM.  Patient had her annual physical exam with her primary care provider  where he had felt a possible mass on her abdomen at the end of last month.  She had ultrasound which a showed solid mass extending into the lumen of the bladder on the righ on 10/29.  Thereafter a CT scan of the abdomen pelvis have been ordered for further evaluation.  CT scan of the abdomen pelvis from 11/14 which noted 11 x 9 cm heterogeneous solid and cystic mass arising from the uterine fundus invading the urinary bladder dome wall as well as the distal sigmoid colon without signs of obstruction.  At that time patient had not reported any complaints of abdominal pain.  She was scheduled to follow-up with Dr. Alvester Morin this morning at 12:45 PM.  However this morning had awoken to severe pain in the right lower quadrant of her abdomen. Pain was sharp. Noted associated nausea, but had not had any vomiting.  Patient words after the CT scan she had a large bowel movement, but  had not had any since.  Denies any prior history of abdominal surgeries or known family history for ovarian or uterine cancer.  Denies having any significant fever, cough, shortness of breath, dysuria, or blood in stool/urine.  Her weight has been stable at around 110 pounds.   In the emergency department patient was noted to be afebrile with stable vital signs.  Labs significant for WBC 11.3, glucose 145, and lipase 56.  Urinalysis did not note significant signs for infection.  CT scan of the abdomen pelvis noted multiple uterine fibroids with a area of the anterior cervix that appears to obliterate the fat plane between the cervix and the posterior wall of the bladder that is concerning for cervical mass, soft tissue nodules in the cul-de-sac concerning for peritoneal nodularity, and 16 mm short axis portal caval lymph node for which metastatic disease was not ruled out.  Dr. Alvester Morin of gynecology oncology have been consulted.  Patient had been given 1 L normal saline IV fluids, morphine IV, and Dilaudid.   Hospital course:  Complex pelvic mass concerning for malignancy Patient presenting to ED with abdominal pain, localized to the right lower quadrant.  CT abdomen/pelvis with multiple uterine fibroids, area of anterior cervix that appears to obliterate the fat plane between the cervix and posterior wall of the bladder concerning for cervical mass, soft tissue nodules in the cul-de-sac concerning for peritoneal nodularity and 16 mm short axis portal lymph node concerning for metastatic disease.  MR pelvis with large complex cystic and solid pelvic mass centrally measuring up to 11.5 x 11.6 x 11.4 cm, concerning for  ovarian malignancy, small amount of ascites, soft tissue enhancing aggressive nodules along the course of the sigmoid colon as well as adjacent fat and presacral spaces.  Underwent IR port placement on 11/01/2023.  CEA 3.5 within normal limits, CA125 elevated at 401.0, CA 19-9 11, within normal  limits.  Underwent colonoscopy by GI on 11/02/2023 preparation poor, noted diverticulosis, skin tags, stool noted throughout with no obvious sigmoid extension of pelvic mass seen; no biopsies taken.  IR evaluated for possible percutaneous sampling of pelvic mass, unfortunately no approachable window per Dr. Lowella Dandy.  Discussed with GYN oncology, Dr. Pricilla Holm with plans for outpatient follow-up with her colleague Dr. Alvester Morin this upcoming week for anticipated laparoscopic approach for biopsy this coming week as well.  Patient and son agreeable for plan.    Hypothyroidism Levothyroxine 75 mcg p.o. daily   Hyperlipidemia Atorvastatin 40 mg p.o. daily   Abdominal aortic aneurysm 4.1 cm aneurysmal dilation of the ascending thoracic aorta, last evaluated on 09/26/2022.  Continue outpatient follow-up/surveillance.  Discharge Diagnoses:  Principal Problem:   Abdominal pain Active Problems:   Abdominal mass   Leukocytosis   Hypothyroidism   Dyslipidemia   AAA (abdominal aortic aneurysm) (HCC)   Uterine mass   Pelvic mass    Discharge Instructions  Discharge Instructions     Call MD for:  difficulty breathing, headache or visual disturbances   Complete by: As directed    Call MD for:  extreme fatigue   Complete by: As directed    Call MD for:  persistant dizziness or light-headedness   Complete by: As directed    Call MD for:  persistant nausea and vomiting   Complete by: As directed    Call MD for:  severe uncontrolled pain   Complete by: As directed    Call MD for:  temperature >100.4   Complete by: As directed    Diet - low sodium heart healthy   Complete by: As directed    Increase activity slowly   Complete by: As directed       Allergies as of 11/03/2023   No Known Allergies      Medication List     TAKE these medications    atorvastatin 40 MG tablet Commonly known as: LIPITOR Take 40 mg by mouth at bedtime.   CoQ10 400 MG Caps Take 400 mg by mouth daily.    doxylamine (Sleep) 25 MG tablet Commonly known as: UNISOM Take 25 mg by mouth at bedtime as needed for sleep.   GLUCOSAMINE 1500 COMPLEX PO Take 1 tablet by mouth daily.   HYDROcodone-acetaminophen 5-325 MG tablet Commonly known as: NORCO/VICODIN Take 1-2 tablets by mouth every 4 (four) hours as needed for moderate pain (pain score 4-6) or severe pain (pain score 7-10).   levothyroxine 75 MCG tablet Commonly known as: SYNTHROID Take 75 mcg by mouth daily before breakfast.   Magnesium 250 MG Tabs Take 250 mg by mouth daily.   meloxicam 7.5 MG tablet Commonly known as: MOBIC Take 7.5 mg by mouth daily.   Omega 3 500 500 MG Caps Take 500 mg by mouth daily.   ondansetron 4 MG tablet Commonly known as: Zofran Take 1 tablet (4 mg total) by mouth every 8 (eight) hours as needed for nausea or vomiting.   polyethylene glycol 17 g packet Commonly known as: MIRALAX / GLYCOLAX Take 17 g by mouth daily as needed for mild constipation.   PRESERVISION AREDS 2+MULTI VIT PO Take 1 tablet by mouth 2 (two)  times daily.   senna-docusate 8.6-50 MG tablet Commonly known as: Senokot-S Take 1 tablet by mouth 2 (two) times daily.   SUPER CALCIUM 600 + D 400 PO Take 1 tablet by mouth daily.   Vitamin D 50 MCG (2000 UT) tablet Take 2,000 Units by mouth daily.        Follow-up Information     Henrine Screws, MD. Schedule an appointment as soon as possible for a visit in 1 week(s).   Specialty: Family Medicine Contact information: [address] [place] [address] [phone]         Clide Cliff, MD. Schedule an appointment as soon as possible for a visit.   Specialty: Gynecologic Oncology Contact information: [address] [place] [address] [phone]         Artis Delay, MD. Schedule an appointment as soon as possible for a visit.   Specialty: Hematology and Oncology Contact information: [address] [place]  [address] [phone]                No Known Allergies  Consultations: GYN-Onc Medical oncology [facility] gastroenterology Interventional radiology   Procedures/Studies: IR IMAGING GUIDED PORT INSERTION  Result Date: 11/01/2023 INDICATION: 77 year old female with suspected gynecologic malignancy requiring central venous access for chemotherapy. EXAM: IMPLANTED PORT A CATH PLACEMENT WITH ULTRASOUND AND FLUOROSCOPIC GUIDANCE COMPARISON:  None Available. MEDICATIONS: None. ANESTHESIA/SEDATION: Moderate (conscious) sedation was employed during this procedure. A total of Versed 1 mg and Fentanyl 50 mcg was administered intravenously. Moderate Sedation Time: 15 minutes. The patient's level of consciousness and vital signs were monitored continuously by radiology nursing throughout the procedure under my direct supervision. CONTRAST:  None FLUOROSCOPY TIME:  One mGy COMPLICATIONS: None immediate. PROCEDURE: The procedure, risks, benefits, and alternatives were explained to the patient. Questions regarding the procedure were encouraged and answered. The patient understands and consents to the procedure. The right neck and chest were prepped with chlorhexidine in a sterile fashion, and a sterile drape was applied covering the operative field. Maximum barrier sterile technique with sterile gowns and gloves were used for the procedure. A timeout was performed prior to the initiation of the procedure. Ultrasound was used to examine the jugular vein which was compressible and free of internal echoes. A skin marker was used to demarcate the planned venotomy and port pocket incision sites. Local anesthesia was provided to these sites and the subcutaneous tunnel track with 1% lidocaine with 1:100,000 epinephrine. A small incision was created at the jugular access site and blunt dissection was performed of the subcutaneous tissues. Under ultrasound guidance, the jugular vein was accessed with a 21 ga  micropuncture needle and an 0.018" wire was inserted to the superior vena cava. Real-time ultrasound guidance was utilized for vascular access including the acquisition of a permanent ultrasound image documenting patency of the accessed vessel. A 5 Fr micopuncture set was then used, through which a 0.035" Rosen wire was passed under fluoroscopic guidance into the inferior vena cava. An 8 Fr dilator was then placed over the wire. A subcutaneous port pocket was then created along the upper chest wall utilizing a combination of sharp and blunt dissection. The pocket was irrigated with sterile saline, packed with gauze, and observed for hemorrhage. A single lumen plastic power injectable port was chosen for placement. The 8 Fr catheter was tunneled from the port pocket site to the venotomy incision. The port was placed in the pocket. The external catheter was trimmed to appropriate length.  The dilator was exchanged for an 8 Fr peel-away sheath under fluoroscopic guidance. The catheter was then placed through the sheath and the sheath was removed. Final catheter positioning was confirmed and documented with a fluoroscopic spot radiograph. The port was accessed with a Huber needle, aspirated, and flushed with heparinized saline. The deep dermal layer of the port pocket incision was closed with interrupted 3-0 Vicryl suture. Dermabond was then placed over the port pocket and neck incisions. The patient tolerated the procedure well without immediate post procedural complication. FINDINGS: After catheter placement, the tip lies within the superior cavoatrial junction. The catheter aspirates and flushes normally and is ready for immediate use. IMPRESSION: Successful placement of a power injectable Port-A-Cath via the right internal jugular vein. The catheter is ready for immediate use. Marliss Coots, MD [facility] Radiology Electronically Signed   By: Marliss Coots M.D.   On:  11/01/2023 13:22   MR ABDOMEN W WO CONTRAST  Result Date: 10/31/2023 CLINICAL DATA:  Pelvic mass.  Liver lesion EXAM: MRI ABDOMEN WITHOUT AND WITH CONTRAST TECHNIQUE: Multiplanar multisequence MR imaging of the abdomen was performed both before and after the administration of intravenous contrast. CONTRAST:  5mL GADAVIST GADOBUTROL 1 MMOL/ML IV SOLN COMPARISON:  CT 10/30/2023 and older. FINDINGS: Lower chest: Trace pleural fluid. Hepatobiliary: Numerous bright T2, low T1 nonenhancing foci identified consistent with benign cystic lesions. Many of these are under 15 mm. There are some larger foci identified such as segment 4 measuring 4.3 cm and caudate measuring 2.8 cm. Prior CT did demonstrate 1 lesion which is more complex anteriorly in the left hepatic lobe, segment 3 which on today's examination when taking into account motion show some progressive enhancement, is bright on T2 but not as bright as simple fluid and consistent with a small hemangioma. No specific aggressive liver lesion clearly identified today. Patent portal vein. Gallbladder is nondilated. No biliary ductal dilatation. Pancreas: Ectatic pancreatic duct diffusely measuring up to 6 mm, severe. No pancreatic focal atrophy, abnormal enhancement or abnormal T1 signal. No restricted diffusion along the pancreas. Spleen:  Within normal limits in size and appearance. Adrenals/Urinary Tract: Adrenal glands are preserved. No enhancing renal mass or collecting system dilatation. Stomach/Bowel: Visualized bowel is nondilated. This includes visualized portions of the small and large bowel. The stomach is underdistended. Vascular/Lymphatic: Normal caliber aorta and IVC. Atherosclerotic changes along the aorta. Circumaortic left renal vein. Once again there is a abnormal lymph node identified anterior to the aorta in the upper abdomen on series 1602, image 58 measuring 2.3 x 1.8 cm. Few other small retroperitoneal nodes identified. Other:  Trace ascites.   Mesenteric stranding. Musculoskeletal: Curvature and degenerative changes along the spine. IMPRESSION: Multiple benign-appearing liver lesions including cysts and 1 hemangioma. Persistent enlarged upper abdominal retroperitoneal lymph node. Additional smaller but prominent nodes as well. With the pelvic findings these are worrisome for potential spread of neoplasm. Mild ascites. Electronically Signed   By: Karen Kays M.D.   On: 10/31/2023 14:10   MR PELVIS W WO CONTRAST  Result Date: 10/31/2023 CLINICAL DATA:  Pelvic mass EXAM: MRI PELVIS WITHOUT AND WITH CONTRAST TECHNIQUE: Multiplanar multisequence MR imaging of the pelvis was performed both before and after administration of intravenous contrast. CONTRAST:  5mL GADAVIST GADOBUTROL 1 MMOL/ML IV SOLN COMPARISON:  CT 10/30/2019 FINDINGS: Urinary Tract: Bladder is mildly distended with fluid. There is some mass effect along the posterior aspect of the bladder related to the adjacent complex mass. The bladder itself  appears grossly intact. No abnormal wall enhancement. Grossly preserved course of the urethra. Bowel: The visualized bowel in the pelvis is nondilated. However there is lobular masslike area along the distal sigmoid colon with the areas of heterogeneous enhancement. The dominant lesion in this location on series 4, image 29 measures 3.7 by 2.6 cm. There are several adjacent soft tissue nodules as well such as just posterior to the cervix and anterior to the bowel measuring 10 mm on series 22 image 30. Focus superior left lateral series 22, image 26 measures 2.3 x 1.7 cm. Additional foci elsewhere dependently in the pelvis including the presacral space. Vascular/Lymphatic: Atherosclerotic changes identified along the iliac vessels. No separate nodal enlargement identified. Reproductive: Uterus measures 8.2 by 2.0 by 3.4 cm. Endometrial stripe is less than 3 mm. Slightly heterogeneous myometrium. Anterior to the uterus is a large complex cystic and  solid mass with heterogeneous enhancement of the solid component. Lesion measures 11.5 by 7.6 by 7.4 cm. The more solid component is right lateral inferior with a aggressive in enhancement measuring proximally 7.7 by 5.6 cm. Cephalocaudal length 8.5 cm. The cystic component more towards the left has a dimensions approaching 8.7 cm. There is surrounding free fluid and edema. Although this more in the central pelvis towards midline a right ovary is not seen as a separate structure. There is what appears to be a small postmenopausal of the left ovary measuring 15 mm. An ovarian neoplasm is a strong consideration. Other:  Small amount of free fluid in the pelvis.  Edema. Musculoskeletal: Curvature of the spine. Moderate degenerative changes of the lumbar spine with disc bulging and areas of stenosis greatest at L4-5. Degenerative changes of the pelvis and hips as well. Study is somewhat limited due to some artifacts postcontrast axial dataset as well as study being performed as a standard pelvis rather than a gynecologic pelvis exam. Please see separate dictation of abdomen MRI. IMPRESSION: Large complex cystic and solid pelvic mass centrally measuring up to 11.5 x 7.6 x 7.4 cm. Based on overall appearance this has worrisome for a neoplasm including an ovarian malignancy. Small amount of ascites in the pelvis. Soft tissue enhancing aggressive nodules along the course of the sigmoid colon as well as in the adjacent fat and presacral spaces. With the larger central mass this very well could be spread of disease to adjacent structures rather than a primary colonic process but correlate with symptoms and if needed colonoscopy. Please see separate dictation of abdominal MRI. Electronically Signed   By: Karen Kays M.D.   On: 10/31/2023 14:01   CT CHEST W CONTRAST  Result Date: 10/31/2023 CLINICAL DATA:  77 year old female with suspected gynecologic malignancy. * Tracking Code: BO * EXAM: CT CHEST WITH CONTRAST  TECHNIQUE: Multidetector CT imaging of the chest was performed during intravenous contrast administration. RADIATION DOSE REDUCTION: This exam was performed according to the departmental dose-optimization program which includes automated exposure control, adjustment of the mA and/or kV according to patient size and/or use of iterative reconstruction technique. CONTRAST:  75mL OMNIPAQUE IOHEXOL 350 MG/ML SOLN COMPARISON:  None Available. FINDINGS: Cardiovascular: The heart size is mildly enlarged. No pericardial effusion. Aortic atherosclerosis and coronary artery calcification. Mediastinum/Nodes: Trachea and esophagus appear unremarkable. The right lobe of thyroid gland appears surgically absent. No mediastinal or hilar adenopathy. Lungs/Pleura: No pleural effusion identified. Subsegmental atelectasis identified within the lingula and bilateral posterior lung bases. No signs of interstitial edema or airspace consolidation. No suspicious pulmonary nodule identified to suggest lung  metastases. Upper Abdomen: No acute abnormality. Multiple liver cysts. Enlarged lymph node within the portal caval region measures 1.5 cm, image 155/3. Defer to report from CT AP dated 10/30/2019 for and same-day MRI of the abdomen pelvis for further details. Musculoskeletal: Mild curvature of the thoracic spine and lumbar spine is convex towards the left. Multilevel degenerative disc disease. No acute or suspicious osseous lesions. IMPRESSION: 1. No signs of metastatic disease to the chest. 2. Areas of subsegmental atelectasis noted within bilateral posterior lung bases and lingula. 3. Enlarged upper abdominal lymph node as above. In the setting of a known malignancy this is concerning for nodal metastasis. 4. Coronary artery calcifications. 5.  Aortic Atherosclerosis (ICD10-I70.0). Electronically Signed   By: Signa Kell M.D.   On: 10/31/2023 05:57   CT ABDOMEN PELVIS W CONTRAST  Result Date: 10/30/2023 CLINICAL DATA:  Abdominal  pain. EXAM: CT ABDOMEN AND PELVIS WITH CONTRAST TECHNIQUE: Multidetector CT imaging of the abdomen and pelvis was performed using the standard protocol following bolus administration of intravenous contrast. RADIATION DOSE REDUCTION: This exam was performed according to the departmental dose-optimization program which includes automated exposure control, adjustment of the mA and/or kV according to patient size and/or use of iterative reconstruction technique. CONTRAST:  75mL OMNIPAQUE IOHEXOL 350 MG/ML SOLN COMPARISON:  Limb 1424 FINDINGS: Lower chest: No acute findings. Hepatobiliary: Multiple hepatic cysts evident. Scattered tiny hypodensities in the liver parenchyma are too small to characterize but are statistically most likely benign. No followup imaging is recommended. Tiny subcapsular lesion measured previously at 9 mm in the anterior left liver is stable on image 23/3 today, nonspecific. There is no evidence for gallstones, gallbladder wall thickening, or pericholecystic fluid. No intrahepatic or extrahepatic biliary dilation. Pancreas: Dilatation of the pancreatic duct to the head and body of pancreas is similar to prior. Spleen: No splenomegaly. No suspicious focal mass lesion. Adrenals/Urinary Tract: No adrenal nodule or mass. Kidneys unremarkable. No evidence for hydroureter. Bladder is distended. Stomach/Bowel: Stomach is unremarkable. No gastric wall thickening. No evidence of outlet obstruction. Duodenum is normally positioned as is the ligament of Treitz. No small bowel wall thickening. No small bowel dilatation. Diverticular changes are noted in the left colon without evidence of diverticulitis. Vascular/Lymphatic: 16 mm short axis portal caval lymph node seen on 22/3. No para-aortic lymphadenopathy. No pelvic sidewall lymphadenopathy. Reproductive: Multiple uterine fibroids evident. As noted on prior study there is an area of the anterior cervix that appears to obliterate the fat plane between the  cervix and the posterior wall of the bladder (see sagittal 86/7). Small soft tissue nodules are again noted in the cul-de-sac some of which may pertain to diverticuli, but others raise concern for peritoneal nodularity (see images 61 and 56 of series 3). Other: No substantial free fluid. Musculoskeletal: No worrisome lytic or sclerotic osseous abnormality. IMPRESSION: 1. Multiple uterine fibroids. As noted on prior study there is an area of the anterior cervix that appears to obliterate the fat plane between the cervix and the posterior wall of the bladder. This is concerning for a cervical mass. Gynecologic consultation recommended. 2. Small soft tissue nodules in the cul-de-sac some of which may relate to diverticuli, but others raise concern for peritoneal nodularity. Attention on follow-up recommended. PET-CT may prove helpful to further evaluate 3. 16 mm short axis portal caval lymph node, metastatic disease not excluded. 4. Left colonic diverticulosis without diverticulitis. Electronically Signed   By: Kennith Center M.D.   On: 10/30/2023 13:08   CT ABDOMEN PELVIS  W CONTRAST  Result Date: 10/26/2023 CLINICAL DATA:  Pt w/ abnormal [place]; mass in pelvis; no h/o cancer; no pain; no urinary issues EXAM: CT ABDOMEN AND PELVIS WITH CONTRAST TECHNIQUE: Multidetector CT imaging of the abdomen and pelvis was performed using the standard protocol following bolus administration of intravenous contrast. RADIATION DOSE REDUCTION: This exam was performed according to the departmental dose-optimization program which includes automated exposure control, adjustment of the mA and/or kV according to patient size and/or use of iterative reconstruction technique. CONTRAST:  ISOVUE-300 IOPAMIDOL (ISOVUE-300) INJECTION 61% COMPARISON:  Ultrasound pelvis 09/01/2023, ultrasound thyroid 10/10/2023 FINDINGS: Lower chest: Mitral annular calcification. Aortic valve leaflet calcification. No acute abnormality. Hepatobiliary:  Multiple fluid density lesions scattered throughout the left right hepatic lobe. There an indeterminate 0.9 cm left hepatic lobe hypodensity with Hounsfield unit of 77 (2:29). No gallstones, gallbladder wall thickening, or pericholecystic fluid. No biliary dilatation. Pancreas: No focal lesion. Normal pancreatic contour. No surrounding inflammatory changes. No main pancreatic ductal dilatation. Spleen: Normal in size without focal abnormality. Adrenals/Urinary Tract: No adrenal nodule bilaterally. Bilateral kidneys enhance symmetrically. No hydronephrosis. No hydroureter. The urinary bladder is unremarkable. On delayed imaging, there is no urothelial wall thickening and there are no filling defects in the opacified portions of the bilateral collecting systems or ureters. Stomach/Bowel: Stomach is within normal limits. No evidence of small bowel wall thickening or dilatation. Increased stool burden proximal to the distal sigmoid colon mass with stool throughout the ascending, transverse, descending colon. Short segment of distal sigmoid colon irregular bowel wall thickening (5:26, 2:62). No large bowel luminal dilatation. Colonic diverticulosis appendix appears normal. Vascular/Lymphatic: No abdominal aorta or iliac aneurysm. 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 No abdominal, pelvic, or inguinal lymphadenopathy. Reproductive: There is a heterogeneous solid and cystic 11 x 9 cm mass arising from the uterine fundus. Finding is noted to invade into the urinary bladder dome (6:81, 5:56 close) where there is loss of intraperitoneal and lower ring of the urinary bladder wall margin. The mass is noted to abut and appears to be inseparable from a short segment of distal sigmoid colon in the region of irregular bowel wall thickening. Other: No intraperitoneal free fluid. No intraperitoneal free gas. No organized fluid collection.  Musculoskeletal: No abdominal wall hernia or abnormality. No suspicious lytic or blastic osseous lesions. No acute displaced fracture. Multilevel severe degenerative changes of the spine. Grade 1 anterolisthesis of L4 on L5 and L5 on S1. Mild retrolisthesis of L2 on L3 and L3 on L4. Dextroscoliosis centered at the L3-L4 level. IMPRESSION: 1. An 11 x 9 cm heterogeneous solid and cystic mass arises from the uterine fundus and is noted to invade the urinary bladder dome wall as well as the distal sigmoid colon. No associated bowel obstruction; however, constipation proximal to irregular bowel wall thickening/mass. No associated stercoral colitis. Finding consistent with malignancy. Recommend gynecologic consultation. When the patient is clinically stable and able to follow directions and hold their breath (preferably as an outpatient) further evaluation with dedicated MRI with and without contrast should be considered. 2. Indeterminate 0.9 cm left hepatic lobe hypodense lesion with a density of 77 HU. Question metastasis versus primary hepatic lesion. 3. Stool throughout the colon 4. Colonic diverticulosis with no acute diverticulitis. 5. 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 6.  Aortic Atherosclerosis (ICD10-I70.0)-severe. These results will be called to the ordering clinician or representative by the Radiologist Assistant, and communication documented in  the PACS or [employer]. Electronically Signed   By: Tish Frederickson M.D.   On: 10/26/2023 18:42   DG BONE DENSITY (DXA)  Result Date: 10/26/2023 EXAM: DUAL X-RAY ABSORPTIOMETRY (DXA) FOR BONE MINERAL DENSITY IMPRESSION: Referring Physician:  Henrine Screws Your patient completed a bone mineral density test using GE Lunar iDXA system (analysis version: 16). Technologist: BEC PATIENT: Name: Leone, King Patient ID: [id number] Birth Date: 1946-09-13 Height: 60.5 in. Sex: Female Measured: 10/26/2023 Weight: 110.2 lbs. Indications: Advanced Age,  Caucasian, Estrogen Deficient, Height Loss (781.91), History of Osteoporosis, Levothyroxine, Postmenopausal Fractures: Left Ankle Treatments: Calcium (E943.0), Vitamin D (E933.5) ASSESSMENT: The BMD measured at Forearm Radius 33% is 0.624 g/cm2 with a T-score of -2.9. This patient's diagnostic category is OSTEOPOROSIS according to World Health Organization [facility]) criteria. Comparison to 10/16/2019. Since the prior study, there has been a SIGNIFICANT DECREASE in bone mineral density of the hips (-4.6%). The lumbar spine was excluded due to being excluded from prior exam. The quality of the exam is good. Site Region Measured Date Measured Age YA BMD Significant CHANGE T-score Right Forearm Radius 33% 10/26/2023 78.0 -2.9 0.624 g/cm2 Right Forearm Radius 33% 10/16/2019 73.0 -2.6 0.653 g/cm2 DualFemur Neck Left 10/26/2023 78.0 -0.6 0.952 g/cm2 DualFemur Neck Left 10/16/2019 74.0 -0.5 0.962 g/cm2 DualFemur Total Mean 10/26/2023 78.0 -0.7 0.918 g/cm2 * DualFemur Total Mean 10/16/2019 74.0 -0.4 0.962 g/cm2 World Health Organization [facility]) criteria for post-menopausal, Caucasian Women: Normal       T-score at or above -1 SD Osteopenia   T-score between -1 and -2.5 SD Osteoporosis T-score at or below -2.5 SD RECOMMENDATION: 1. All patients should optimize calcium and vitamin D intake. 2. Consider FDA-approved medical therapies in postmenopausal women and men aged 78 years and older, based on the following: a. A hip or vertebral (clinical or morphometric) fracture. b. T-score = -2.5 at the femoral neck or spine after appropriate evaluation to exclude secondary causes. c. Low bone mass (T-score between -1.0 and -2.5 at the femoral neck or spine) and a 10-year probability of a hip fracture = 3% or a 10-year probability of a major osteoporosis-related fracture = 20% based on the [place]-adapted WHO algorithm. d. Clinician judgment and/or patient preferences may indicate treatment for people with 10-year fracture probabilities above or  below these levels. FOLLOW-UP: Patients with diagnosis of osteoporosis or at high risk for fracture should have regular bone mineral density tests.? Patients eligible for Medicare are allowed routine testing every 2 years.? The testing frequency can be increased to one year for patients who have rapidly progressing disease, are receiving or discontinuing medical therapy to restore bone mass, or have additional risk factors. I have reviewed this study and agree with the findings. [facility] Electronically Signed   By: Harmon Pier M.D.   On: 10/26/2023 12:30   US PELVIS LIMITED (TRANSABDOMINAL ONLY)  Result Date: 10/25/2023 CLINICAL DATA:  78 year old female with reported 3 cm mass between the umbilicus and the pubic symphysis. EXAM: LIMITED ULTRASOUND OF PELVIS TECHNIQUE: Limited transabdominal ultrasound examination of the pelvis was performed. COMPARISON:  None Available. FINDINGS: Limited transabdominal ultrasound of the pelvis was performed. Dedicated imaging of the uterus and ovaries was not included. The provided images are limited, although demonstrate an apparent solid mass extending into the lumen of the bladder on the right. It is unclear as to whether this reflects a primary bladder mass or an adjacent adnexal lesion. Regardless, findings are concerning for pelvic malignancy. Further evaluation strongly recommended. IMPRESSION: Limited  transabdominal ultrasound of the pelvis demonstrates an apparent solid mass extending into the lumen of the bladder on the right. It is unclear as to whether this reflects a primary bladder mass or an adjacent adnexal lesion. Regardless, findings are concerning for pelvic malignancy. Further evaluation with CT of the abdomen and pelvis with contrast recommended. These results will be called to the ordering clinician or representative by the Radiologist Assistant, and communication documented in the PACS or [employer]. Electronically Signed   By:  Carey Bullocks M.D.   On: 10/25/2023 09:24   US THYROID  Result Date: 10/21/2023 CLINICAL DATA:  Goiter. History of prior right hemithyroidectomy for benign disease. Subsequently, a left inferior thyroid nodule was biopsied in July of 2020. EXAM: THYROID ULTRASOUND TECHNIQUE: Ultrasound examination of the thyroid gland and adjacent soft tissues was performed. COMPARISON:  Prior thyroid ultrasound 06/10/2019; left inferior thyroid nodule biopsy 07/11/2019 FINDINGS: Parenchymal Echotexture: Moderately heterogenous Isthmus: Surgically absent Right lobe: Surgically absent Left lobe: 3.2 x 1.3 x 1.5 cm _________________________________________________________ Estimated total number of nodules >/= 1 cm: 2 Number of spongiform nodules >/=  2 cm not described below (TR1): 0 Number of mixed cystic and solid nodules >/= 1.5 cm not described below (TR2): 0 _________________________________________________________ The thyroid isthmus and right gland are surgically absent. The remaining left thyroid gland is diffusely heterogeneous and largely replaced by multiple solid thyroid nodules. The majority of the nodules are small and subcentimeter or complex cystic or spongiform in nature. Nodule # 4: The previously biopsied nodule in the deep aspect of the left lower gland measures 1.1 x 0.8 x 0.9 cm, slightly smaller than seen at the time of biopsy. No other individual thyroid nodule meets criteria for imaging surveillance. IMPRESSION: 1. Slight interval decrease in size of the previously biopsied nodule in the left lower gland consistent with benign findings. 2. Surgical changes of prior right hemithyroidectomy. 3. 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 None of the individual nodules meet criteria for further evaluation. The above is in keeping with the ACR TI-RADS recommendations - J Am Coll Radiol 2017;14:587-595. Electronically Signed   By: Malachy Moan M.D.   On: 10/21/2023 09:13   MM  3D SCREENING MAMMOGRAM BILATERAL BREAST  Result Date: 10/13/2023 CLINICAL DATA:  Screening. EXAM: DIGITAL SCREENING BILATERAL MAMMOGRAM WITH TOMOSYNTHESIS AND CAD TECHNIQUE: Bilateral screening digital craniocaudal and mediolateral oblique mammograms were obtained. Bilateral screening digital breast tomosynthesis was performed. The images were evaluated with computer-aided detection. COMPARISON:  Previous exam(s). ACR Breast Density Category d: The breasts are extremely dense, which lowers the sensitivity of mammography. FINDINGS: There are no findings suspicious for malignancy. IMPRESSION: No mammographic evidence of malignancy. A result letter of this screening mammogram will be mailed directly to the patient. RECOMMENDATION: Screening mammogram in one year. (Code:[id number]-B-01Y) BI-RADS CATEGORY  1: Negative. Electronically Signed   By: Ted Mcalpine M.D.   On: 10/13/2023 13:17     Subjective: Patient seen examined bedside, resting calmly.  Lying in bed.  Son present.  Pain controlled.  Tolerating diet.  Discussed with patient plans for outpatient follow-up with GYN/oncology for need of a laparoscopic approach of complex pelvic mass biopsy.  They are agreeable for discharge and outpatient follow-up, hopefully on Monday with surgery planned thereafter this upcoming week.  No other specific complaints, concerns or questions at this time.  Once again discussed with GYN/oncology, Dr. Pricilla Holm this morning.  Patient denies headache, no visual changes, no chest pain, no palpitations, no shortness of breath,  no abdominal pain, no fever/chills/night sweats, no nausea/vomiting/diarrhea, no focal weakness, no fatigue, no paresthesias.  No acute events overnight per nursing staff.  Discharge Exam: Vitals:   11/03/23 0611 11/03/23 0730  BP: (!) 144/89 130/81  Pulse: 65 62  Resp: 18 15  Temp: 97.8 F (36.6 C) 98.2 F (36.8 C)  SpO2: 95% 95%   Vitals:   11/02/23 1437 11/02/23 1848 11/03/23 0611 11/03/23 0730   BP: 123/77 112/66 (!) 144/89 130/81  Pulse: 78 97 65 62  Resp: 19 16 18 15   Temp: 99.3 F (37.4 C) 99.2 F (37.3 C) 97.8 F (36.6 C) 98.2 F (36.8 C)  TempSrc: Oral Oral Oral Oral  SpO2: 95% 93% 95% 95%  Weight:      Height:        Physical Exam: GEN: NAD, alert and oriented x 3, wd/wn HEENT: NCAT, PERRL, EOMI, sclera clear, MMM PULM: CTAB w/o wheezes/crackles, normal respiratory effort CV: RRR w/o M/G/R GI: abd soft, nondistended, noted soft mass right lower pelvis, + BS MSK: no peripheral edema, muscle strength globally intact 5/5 bilateral upper/lower extremities NEURO: CN II-XII intact, no focal deficits, sensation to light touch intact PSYCH: normal mood/affect Integumentary: dry/intact, no rashes or wounds    The results of significant diagnostics from this hospitalization (including imaging, microbiology, ancillary and laboratory) are listed below for reference.     Microbiology: No results found for this or any previous visit (from the past 240 hour(s)).   Labs: BNP (last 3 results) No results for input(s): "BNP" in the last 8760 hours. Basic Metabolic Panel: Recent Labs  Lab 10/30/23 0739 10/31/23 0740 11/01/23 0527  NA 137 137 138  K 3.8 4.2 3.8  CL 102 102 102  CO2 25 26 29   GLUCOSE 145* 94 104*  BUN 18 17 12   CREATININE 0.66 0.64 0.62  CALCIUM 9.6 9.0 8.9  MG  --   --  2.0   Liver Function Tests: Recent Labs  Lab 10/30/23 0739  AST 29  ALT 16  ALKPHOS 66  BILITOT 0.9  PROT 6.3*  ALBUMIN 3.8   Recent Labs  Lab 10/30/23 0739  LIPASE 56*   No results for input(s): "AMMONIA" in the last 168 hours. CBC: Recent Labs  Lab 10/30/23 0739 10/31/23 0740 11/01/23 0527  WBC 11.3* 9.6 6.7  NEUTROABS  --   --  4.8  HGB 14.9 14.5 14.2  HCT 47.0* 45.7 43.9  MCV 91.4 91.6 90.0  PLT 184 197 163   Cardiac Enzymes: No results for input(s): "CKTOTAL", "CKMB", "CKMBINDEX", "TROPONINI" in the last 168 hours. BNP: Invalid input(s):  "POCBNP" CBG: No results for input(s): "GLUCAP" in the last 168 hours. D-Dimer No results for input(s): "DDIMER" in the last 72 hours. Hgb A1c No results for input(s): "HGBA1C" in the last 72 hours. Lipid Profile No results for input(s): "CHOL", "HDL", "LDLCALC", "TRIG", "CHOLHDL", "LDLDIRECT" in the last 72 hours. Thyroid function studies No results for input(s): "TSH", "T4TOTAL", "T3FREE", "THYROIDAB" in the last 72 hours.  Invalid input(s): "FREET3" Anemia work up No results for input(s): "VITAMINB12", "FOLATE", "FERRITIN", "TIBC", "IRON", "RETICCTPCT" in the last 72 hours. Urinalysis    Component Value Date/Time   COLORURINE YELLOW 10/30/2023 0739   APPEARANCEUR HAZY (A) 10/30/2023 0739   LABSPEC 1.012 10/30/2023 0739   PHURINE 7.0 10/30/2023 0739   GLUCOSEU NEGATIVE 10/30/2023 0739   HGBUR NEGATIVE 10/30/2023 0739   BILIRUBINUR NEGATIVE 10/30/2023 0739   KETONESUR 5 (A) 10/30/2023 0739   PROTEINUR NEGATIVE  10/30/2023 0739   NITRITE NEGATIVE 10/30/2023 0739   LEUKOCYTESUR TRACE (A) 10/30/2023 0739   Sepsis Labs Recent Labs  Lab 10/30/23 0739 10/31/23 0740 11/01/23 0527  WBC 11.3* 9.6 6.7   Microbiology No results found for this or any previous visit (from the past 240 hour(s)).   Time coordinating discharge: Over 30 minutes  SIGNED:   Alvira Philips [place], DO  [facility] 11/03/2023, 9:58 AM

## 2023-11-03 NOTE — Telephone Encounter (Signed)
Also called patient's son, Lennon Alstrom and updated him on the plan and appointment times.

## 2023-11-03 NOTE — Care Management Important Message (Signed)
Important Message  Patient Details  Name: Lori Conrad MRN: [id number] Date of Birth: 10-Apr-1946   Important Message Given:  Yes - Medicare IM     Sherilyn Banker 11/03/2023, 9:27 AM

## 2023-11-03 NOTE — Plan of Care (Signed)
  Problem: Education: Goal: Knowledge of General Education information will improve Description: Pain scale 0-10, Tylenol pain level 1-3, Norco pain level 4-6 q 4 hours, Morphine 2 mg IV q 2 hours pain level 7-10 Outcome: Progressing   Problem: Health Behavior/Discharge Planning: Goal: Ability to manage health-related needs will improve Outcome: Progressing   Problem: Clinical Measurements: Goal: Ability to maintain clinical measurements within normal limits will improve Outcome: Progressing Goal: Will remain free from infection Outcome: Progressing Goal: Diagnostic test results will improve Outcome: Progressing Goal: Cardiovascular complication will be avoided Outcome: Progressing   Problem: Activity: Goal: Risk for activity intolerance will decrease Outcome: Progressing   Problem: Nutrition: Goal: Adequate nutrition will be maintained Description: Patient has no appetite. Encouraging fluids Outcome: Progressing

## 2023-11-03 NOTE — Progress Notes (Signed)
Patient and son both verbalized understanding of dc instructions. All belongings and dc papers  given to patient.

## 2023-11-03 NOTE — Progress Notes (Signed)
   11/03/23 1000  Mobility  Activity Ambulated independently in hallway  Level of Assistance Independent  Assistive Device None  Distance Ambulated (ft) 1200 ft  Activity Response Tolerated well  Mobility Referral Yes  $Mobility charge 1 Mobility  Mobility Specialist Start Time (ACUTE ONLY) 0945  Mobility Specialist Stop Time (ACUTE ONLY) 1000  Mobility Specialist Time Calculation (min) (ACUTE ONLY) 15 min   Mobility Specialist: Progress Note  Pt agreeable to mobility session - received in bed. Pt was asymptomatic throughout session with no complaints. Returned to bed with all needs met - call bell within reach. Pt encouraged to continue mobilizing post surgery.  Barnie Mort, BS Mobility Specialist Please contact via SecureChat or Rehab office at [phone].

## 2023-11-05 ENCOUNTER — Encounter (HOSPITAL_COMMUNITY): Payer: Self-pay | Admitting: Gastroenterology

## 2023-11-06 ENCOUNTER — Other Ambulatory Visit: Payer: Self-pay

## 2023-11-06 ENCOUNTER — Inpatient Hospital Stay: Payer: Medicare HMO

## 2023-11-06 ENCOUNTER — Inpatient Hospital Stay: Payer: Medicare HMO | Attending: Psychiatry | Admitting: Psychiatry

## 2023-11-06 ENCOUNTER — Inpatient Hospital Stay: Payer: Medicare HMO | Admitting: Gynecologic Oncology

## 2023-11-06 ENCOUNTER — Encounter: Payer: Self-pay | Admitting: Psychiatry

## 2023-11-06 VITALS — BP 151/98 | HR 83 | Temp 99.0°F | Resp 20 | Wt 109.6 lb

## 2023-11-06 DIAGNOSIS — C786 Secondary malignant neoplasm of retroperitoneum and peritoneum: Secondary | ICD-10-CM

## 2023-11-06 DIAGNOSIS — C801 Malignant (primary) neoplasm, unspecified: Secondary | ICD-10-CM | POA: Diagnosis not present

## 2023-11-06 DIAGNOSIS — C541 Malignant neoplasm of endometrium: Secondary | ICD-10-CM

## 2023-11-06 DIAGNOSIS — C772 Secondary and unspecified malignant neoplasm of intra-abdominal lymph nodes: Secondary | ICD-10-CM

## 2023-11-06 DIAGNOSIS — R1900 Intra-abdominal and pelvic swelling, mass and lump, unspecified site: Secondary | ICD-10-CM

## 2023-11-06 DIAGNOSIS — N858 Other specified noninflammatory disorders of uterus: Secondary | ICD-10-CM

## 2023-11-06 DIAGNOSIS — Z7189 Other specified counseling: Secondary | ICD-10-CM

## 2023-11-06 DIAGNOSIS — N838 Other noninflammatory disorders of ovary, fallopian tube and broad ligament: Secondary | ICD-10-CM

## 2023-11-06 DIAGNOSIS — R971 Elevated cancer antigen 125 [CA 125]: Secondary | ICD-10-CM | POA: Diagnosis not present

## 2023-11-06 MED ORDER — SODIUM CHLORIDE 0.9% FLUSH
10.0000 mL | Freq: Once | INTRAVENOUS | Status: AC
  Administered 2023-11-06: 10 mL

## 2023-11-06 MED ORDER — HEPARIN SOD (PORK) LOCK FLUSH 100 UNIT/ML IV SOLN
500.0000 [IU] | Freq: Once | INTRAVENOUS | Status: DC
Start: 2023-11-06 — End: 2023-11-06

## 2023-11-06 MED ORDER — HEPARIN SOD (PORK) LOCK FLUSH 100 UNIT/ML IV SOLN
500.0000 [IU] | Freq: Once | INTRAVENOUS | Status: AC
  Administered 2023-11-06: 500 [IU] via INTRAVENOUS

## 2023-11-06 MED ORDER — SODIUM CHLORIDE 0.9% FLUSH: 10.0000 mL | INTRAVENOUS | Status: DC | PRN

## 2023-11-06 NOTE — H&P (View-Only) (Signed)
Gynecologic Oncology Return Clinic Visit  Date of Service: 11/06/2023 Referring Provider: Henrine Screws, MD [address] [place],  [place] [address]  Assessment & Plan: Lori Conrad is a 77 y.o. woman with imaging findings concerning for an ovarian mass with peritoneal implants along the rectosigmoid colon, and 1.6cm PA lymph node near the renal vessels who presents for treatment discussion today.  Summarized workup to date.  Based on MRI pelvis, the mass seems more suspicious for an ovarian mass.  She additionally has concern for peritoneal implants along the rectosigmoid colon.  On sigmoidoscopy, there is no evidence of direct invasion through to the mucosal surface, and as such, no biopsy was able to be obtained on sigmoidoscopy.  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  Given the high PA lymph node as well as concern for involvement of the rectum that would require a rectosigmoid resection, we have discussed proceeding with diagnostic laparoscopy and biopsy to obtain a tissue diagnosis and proceed with neoadjuvant chemotherapy prior to debulking surgery.  Patient is amenable to this plan.  Patient was consented for: diagnostic laparoscopy and biopsy on 11/08/23 with Dr. Pricilla Holm.  The risks of surgery were discussed in detail and she understands these to including but not limited to bleeding requiring a blood transfusion, infection, injury to adjacent organs (including but not limited to the bowels, bladder, ureters, nerves, blood vessels), thromboembolic events, wound separation, unforseen complication, and possible need for re-exploration.  If the patient experiences any of these events, she understands that her hospitalization or recovery may be prolonged and that she may need to take additional medications for a prolonged period. The patient will receive DVT and antibiotic prophylaxis as indicated. She voiced a clear understanding. She had the  opportunity to ask questions and informed consent was obtained today. She wishes to proceed.  She does not require preoperative clearance. Her METs are >4.  All preoperative instructions were reviewed. Postoperative expectations were also reviewed. Written handouts were provided to the patient.  RTC postop.  Clide Cliff, MD Gynecologic Oncology   Medical Decision Making I personally spent  TOTAL 45 minutes face-to-face and non-face-to-face in the care of this patient, which includes all pre, intra, and post visit time on the date of service.  ----------------------- Reason for Visit: Follow-up  Treatment History: Presented to the [facility] ER on 10/30/2023 with right sided abdominal pain. She reported the pain as severe, sharp, starting early that am. She reported nausea with no emesis. Reports last BM was large output after the PO contrast from CT on 10/26/23. Up until presentation, she was eating/drinking at home without issue. Labs in the ER included lipase at 56, WBC 11.3, Hgb 14.9, Hct 47, UA with trace leukocytes. CT AP imaging was performed on 10/30/2023 and compared with recent CT on 10/26/23 showed an area anterior to the cervix that appears to obliterate the fat plane between the cervix and the posterior wall of the bladder, concerning for cervical mass.  Soft tissue nodules in the cul-de-sac concerning for peritoneal nodularity.  16mm portacaval lymph node.   Of note, she saw her primary care provider for her annual examination and was noted to have a palpable abdominal mass. She underwent a pelvic ultrasound revealing an apparent solid mass extending into the lumen of the bladder on the right. To further evaluate, she underwent a CT AP on 10/26/2023 with similar findings as above an indeterminate 0.9 cm left hepatic lobe hypodense lesion.  She was  seen for new consultation inpatient on 10/30/2023.  During her hospitalization, she underwent a CT chest which was negative for metastatic  disease.  MR abdomen showed benign appearing liver lesions.  An endometrial biopsy was performed and negative.  Tumor markers were collected and demonstrated a CA125 of 401, normal CEA of 3.5, and normal CA 19-9 of 11.  A sigmoidoscopy was performed which showed multiple large mouth diverticula and significant luminal narrowing but no obvious sigmoid extension of pelvic mass seen.  She was discharged to outpatient follow-up with plan for outpatient diagnostic laparoscopy and biopsy for tissue diagnosis.  Interval History: Presents today with her son.  Has not been having pain at home and not needing any pain medications.  Having regular bowel movements.  Denies nausea or vomiting.  Eating normally.   Past Medical/Surgical History: Past Medical History:  Diagnosis Date   Hyperlipidemia    Hypothyroidism    Macular degeneration     Past Surgical History:  Procedure Laterality Date   ANKLE FRACTURE SURGERY Left    BROW LIFT Bilateral 05/17/2022   Procedure: BILATERAL UPPER BLEPHAROPLASTY WITH PTOSIS REPAIR;  Surgeon: Janne Napoleon, MD;  Location: [facility] OR;  Service: Plastics;  Laterality: Bilateral;  1.5 hours   BUNIONECTOMY Left    EYE SURGERY     FLEXIBLE SIGMOIDOSCOPY N/A 11/02/2023   Procedure: FLEXIBLE SIGMOIDOSCOPY;  Surgeon: Kerin Salen, MD;  Location[facility] ENDOSCOPY;  Service: Gastroenterology;  Laterality: N/A;   IR IMAGING GUIDED PORT INSERTION  11/01/2023   REFRACTIVE SURGERY     laser for macular degeneration   SHOULDER ARTHROSCOPY     TONSILLECTOMY      Family History  Problem Relation Age of Onset   Lung cancer Mother    Stroke Mother 45   Alzheimer's disease Father    CAD Father        95% occlusion of L Main & RCA, severe descending aorta, arterial & arteriolonephrosclerosis    Cholecystitis Father    Esophagitis Father    Pancreatic cancer Daughter    BRCA 1/2 Neg Hx    Breast cancer Neg Hx    Colon cancer Neg Hx    Ovarian cancer Neg Hx    Endometrial cancer  Neg Hx    Prostate cancer Neg Hx     Social History   Socioeconomic History   Marital status: Widowed    Spouse name: Not on file   Number of children: 2   Years of education: Not on file   Highest education level: Not on file  Occupational History   Not on file  Tobacco Use   Smoking status: Former    Current packs/day: 0.00    Types: Cigarettes    Quit date: 05/15/1988    Years since quitting: 35.5   Smokeless tobacco: Never  Vaping Use   Vaping status: Never Used  Substance and Sexual Activity   Alcohol use: Yes    Alcohol/week: 14.0 standard drinks of alcohol    Types: 14 drink(s) per week    Comment: white wine   Drug use: No   Sexual activity: Not Currently    Partners: Male  Other Topics Concern   Not on file  Social History Narrative   Not on file   Social Determinants of Health   Financial Resource Strain: Not on file  Food Insecurity: No Food Insecurity (10/30/2023)   Hunger Vital Sign    Worried About Running Out of Food in the Last Year: Never true  Ran Out of Food in the Last Year: Never true  Transportation Needs: No Transportation Needs (10/30/2023)   PRAPARE - Administrator, Civil Service (Medical): No    Lack of Transportation (Non-Medical): No  Physical Activity: Not on file  Stress: Not on file  Social Connections: Not on file    Current Medications:  Current Outpatient Medications:    atorvastatin (LIPITOR) 40 MG tablet, Take 40 mg by mouth at bedtime., Disp: , Rfl:    Calcium Carb-Cholecalciferol (SUPER CALCIUM 600 + D 400 PO), Take 1 tablet by mouth daily., Disp: , Rfl:    Cholecalciferol (VITAMIN D) 50 MCG (2000 UT) tablet, Take 2,000 Units by mouth daily., Disp: , Rfl:    Coenzyme Q10 (COQ10) 400 MG CAPS, Take 400 mg by mouth daily., Disp: , Rfl:    doxylamine, Sleep, (UNISOM) 25 MG tablet, Take 25 mg by mouth at bedtime as needed for sleep., Disp: , Rfl:    Glucosamine-Chondroit-Vit C-Mn (GLUCOSAMINE 1500 COMPLEX PO),  Take 1 tablet by mouth daily., Disp: , Rfl:    HYDROcodone-acetaminophen (NORCO/VICODIN) 5-325 MG tablet, Take 1-2 tablets by mouth every 4 (four) hours as needed for moderate pain (pain score 4-6) or severe pain (pain score 7-10)., Disp: 30 tablet, Rfl: 0   levothyroxine (SYNTHROID) 75 MCG tablet, Take 75 mcg by mouth daily before breakfast., Disp: , Rfl:    Magnesium 250 MG TABS, Take 250 mg by mouth daily., Disp: , Rfl:    meloxicam (MOBIC) 7.5 MG tablet, Take 7.5 mg by mouth daily., Disp: , Rfl:    Multiple Vitamins-Minerals (PRESERVISION AREDS 2+MULTI VIT PO), Take 1 tablet by mouth 2 (two) times daily., Disp: , Rfl:    Omega-3 Fatty Acids (OMEGA 3 500) 500 MG CAPS, Take 500 mg by mouth daily., Disp: , Rfl:    ondansetron (ZOFRAN) 4 MG tablet, Take 1 tablet (4 mg total) by mouth every 8 (eight) hours as needed for nausea or vomiting., Disp: 30 tablet, Rfl: 0   polyethylene glycol (MIRALAX / GLYCOLAX) 17 g packet, Take 17 g by mouth daily as needed for mild constipation., Disp: 14 each, Rfl: 0   senna-docusate (SENOKOT-S) 8.6-50 MG tablet, Take 1 tablet by mouth 2 (two) times daily., Disp: 180 tablet, Rfl: 0  Review of Symptoms: Complete 10-system review is negative except as above in Interval History.  Physical Exam: There were no vitals taken for this visit. General: Alert, oriented, no acute distress. HEENT: Normocephalic, atraumatic. Neck symmetric without masses. Sclera anicteric.  Chest: Normal work of breathing. Port site clean. Bruising. Glue at surgical sites Cardiovascular: Regular rate and rhythm. Extremities: Grossly normal range of motion.  Warm, well perfused.  No edema bilaterally. Skin: No rashes or lesions noted.   Laboratory & Radiologic Studies: Surgical pathology (10/31/23): A. ENDOMETRIUM, BIOPSY:  -  Scant superficial fragments of Endo/ectocervical mucosa and atrophic  strips of endometrium in the background of mucus and blood.    CT CHEST W CONTRAST  10/30/2023  Narrative CLINICAL DATA:  77 year old female with suspected gynecologic malignancy.  * Tracking Code: BO *  EXAM: CT CHEST WITH CONTRAST  TECHNIQUE: Multidetector CT imaging of the chest was performed during intravenous contrast administration.  RADIATION DOSE REDUCTION: This exam was performed according to the departmental dose-optimization program which includes automated exposure control, adjustment of the mA and/or kV according to patient size and/or use of iterative reconstruction technique.  CONTRAST:  75mL OMNIPAQUE IOHEXOL 350 MG/ML SOLN  COMPARISON:  None Available.  FINDINGS: Cardiovascular: The heart size is mildly enlarged. No pericardial effusion. Aortic atherosclerosis and coronary artery calcification.  Mediastinum/Nodes: Trachea and esophagus appear unremarkable. The right lobe of thyroid gland appears surgically absent. No mediastinal or hilar adenopathy.  Lungs/Pleura: No pleural effusion identified. Subsegmental atelectasis identified within the lingula and bilateral posterior lung bases. No signs of interstitial edema or airspace consolidation. No suspicious pulmonary nodule identified to suggest lung metastases.  Upper Abdomen: No acute abnormality. Multiple liver cysts. Enlarged lymph node within the portal caval region measures 1.5 cm, image 155/3. Defer to report from CT AP dated 10/30/2019 for and same-day MRI of the abdomen pelvis for further details.  Musculoskeletal: Mild curvature of the thoracic spine and lumbar spine is convex towards the left. Multilevel degenerative disc disease. No acute or suspicious osseous lesions.  IMPRESSION: 1. No signs of metastatic disease to the chest. 2. Areas of subsegmental atelectasis noted within bilateral posterior lung bases and lingula. 3. Enlarged upper abdominal lymph node as above. In the setting of a known malignancy this is concerning for nodal metastasis. 4. Coronary artery  calcifications. 5.  Aortic Atherosclerosis (ICD10-I70.0).   Electronically Signed By: Signa Kell M.D. On: 10/31/2023 05:57   MR PELVIS W WO CONTRAST 10/30/2023  Narrative CLINICAL DATA:  Pelvic mass  EXAM: MRI PELVIS WITHOUT AND WITH CONTRAST  TECHNIQUE: Multiplanar multisequence MR imaging of the pelvis was performed both before and after administration of intravenous contrast.  CONTRAST:  5mL GADAVIST GADOBUTROL 1 MMOL/ML IV SOLN  COMPARISON:  CT 10/30/2019  FINDINGS: Urinary Tract: Bladder is mildly distended with fluid. There is some mass effect along the posterior aspect of the bladder related to the adjacent complex mass. The bladder itself appears grossly intact. No abnormal wall enhancement. Grossly preserved course of the urethra.  Bowel: The visualized bowel in the pelvis is nondilated. However there is lobular masslike area along the distal sigmoid colon with the areas of heterogeneous enhancement. The dominant lesion in this location on series 4, image 29 measures 3.7 by 2.6 cm. There are several adjacent soft tissue nodules as well such as just posterior to the cervix and anterior to the bowel measuring 10 mm on series 22 image 30. Focus superior left lateral series 22, image 26 measures 2.3 x 1.7 cm. Additional foci elsewhere dependently in the pelvis including the presacral space.  Vascular/Lymphatic: Atherosclerotic changes identified along the iliac vessels. No separate nodal enlargement identified.  Reproductive: Uterus measures 8.2 by 2.0 by 3.4 cm. Endometrial stripe is less than 3 mm. Slightly heterogeneous myometrium. Anterior to the uterus is a large complex cystic and solid mass with heterogeneous enhancement of the solid component. Lesion measures 11.5 by 7.6 by 7.4 cm. The more solid component is right lateral inferior with a aggressive in enhancement measuring proximally 7.7 by 5.6 cm. Cephalocaudal length 8.5 cm. The cystic component  more towards the left has a dimensions approaching 8.7 cm. There is surrounding free fluid and edema. Although this more in the central pelvis towards midline a right ovary is not seen as a separate structure. There is what appears to be a small postmenopausal of the left ovary measuring 15 mm. An ovarian neoplasm is a strong consideration.  Other:  Small amount of free fluid in the pelvis.  Edema.  Musculoskeletal: Curvature of the spine. Moderate degenerative changes of the lumbar spine with disc bulging and areas of stenosis greatest at L4-5. Degenerative changes of the pelvis and hips as well.  Study is somewhat limited  due to some artifacts postcontrast axial dataset as well as study being performed as a standard pelvis rather than a gynecologic pelvis exam. Please see separate dictation of abdomen MRI.  IMPRESSION: Large complex cystic and solid pelvic mass centrally measuring up to 11.5 x 7.6 x 7.4 cm. Based on overall appearance this has worrisome for a neoplasm including an ovarian malignancy. Small amount of ascites in the pelvis.  Soft tissue enhancing aggressive nodules along the course of the sigmoid colon as well as in the adjacent fat and presacral spaces. With the larger central mass this very well could be spread of disease to adjacent structures rather than a primary colonic process but correlate with symptoms and if needed colonoscopy.  Please see separate dictation of abdominal MRI.   Electronically Signed By: Karen Kays M.D. On: 10/31/2023 14:01

## 2023-11-06 NOTE — Patient Instructions (Addendum)
Preparing for your Surgery  Plan for surgery on 11/08/2023 with Dr. Eugene Garnet at [facility]. You will be scheduled for diagnostic laparoscopy (looking into the abdomen through small incisions with a camera), intra-abdominal biopsies.   Pre-operative Testing -(Done, tomorrow) You will receive a phone call from presurgical testing at [facility] to arrange for a pre-operative appointment and lab work.  -Bring your insurance card, copy of an advanced directive if applicable, medication list  -At that visit, you will be asked to sign a consent for a possible blood transfusion in case a transfusion becomes necessary during surgery.  The need for a blood transfusion is rare but having consent is a necessary part of your care.     -You should not be taking blood thinners or aspirin at least ten days prior to surgery unless instructed by your surgeon.  -Do not take supplements such as fish oil (omega 3), red yeast rice, turmeric before your surgery. You want to avoid medications with aspirin in them including headache powders such as BC or Goody's), Excedrin migraine.  Day Before Surgery at Home -You will be asked to take in a light diet the day before surgery. You will be advised you can have clear liquids up until 3 hours before your surgery.    Eat a light diet the day before surgery.  Examples including soups, broths, toast, yogurt, mashed potatoes.  AVOID GAS PRODUCING FOODS AND BEVERAGES. Things to avoid include carbonated beverages (fizzy beverages, sodas), raw fruits and raw vegetables (uncooked), or beans.   If your bowels are filled with gas, your surgeon will have difficulty visualizing your pelvic organs which increases your surgical risks.  Your role in recovery Your role is to become active as soon as directed by your doctor, while still giving yourself time to heal.  Rest when you feel tired. You will be asked to do the following in order to speed your  recovery:  - Cough and breathe deeply. This helps to clear and expand your lungs and can prevent pneumonia after surgery.  - STAY ACTIVE WHEN YOU GET HOME. Do mild physical activity. Walking or moving your legs help your circulation and body functions return to normal. Do not try to get up or walk alone the first time after surgery.   -If you develop swelling on one leg or the other, pain in the back of your leg, redness/warmth in one of your legs, please call the office or go to the Emergency Room to have a doppler to rule out a blood clot. For shortness of breath, chest pain-seek care in the Emergency Room as soon as possible. - Actively manage your pain. Managing your pain lets you move in comfort. We will ask you to rate your pain on a scale of zero to 10. It is your responsibility to tell your doctor or nurse where and how much you hurt so your pain can be treated.  Special Considerations -If you are diabetic, you may be placed on insulin after surgery to have closer control over your blood sugars to promote healing and recovery.  This does not mean that you will be discharged on insulin.  If applicable, your oral antidiabetics will be resumed when you are tolerating a solid diet.  -Your final pathology results from surgery should be available around one week after surgery and the results will be relayed to you when available.  -Dr. Antionette Char is the surgeon that assists your GYN Oncologist with surgery.  If you end up staying the night, the next day after your surgery you will either see Dr. Pricilla Holm, Dr. Alvester Morin, or Dr. Antionette Char.  -FMLA forms can be faxed to [phone] and please allow 5-7 business days for completion.  Pain Management After Surgery -You can use the hydrocodone/APAP for pain at home. It has tylenol in it so monitor your tylenol intake.   -Make sure that you have Tylenol and Ibuprofen IF YOU ARE ABLE TO TAKE THESE MEDICATIONS at home to use on a regular  basis after surgery for pain control. We recommend alternating the medications every hour to six hours since they work differently and are processed in the body differently for pain relief.  -Review the attached handout on narcotic use and their risks and side effects.   Bowel Regimen -It is important to prevent constipation and drink adequate amounts of liquids. Monitor for constipation and take a laxative as needed.  Risks of Surgery Risks of surgery are low but include bleeding, infection, damage to surrounding structures, re-operation, blood clots, and very rarely death.   Blood Transfusion Information (For the consent to be signed before surgery)  We will be checking your blood type before surgery so in case of emergencies, we will know what type of blood you would need.                                            WHAT IS A BLOOD TRANSFUSION?  A transfusion is the replacement of blood or some of its parts. Blood is made up of multiple cells which provide different functions. Red blood cells carry oxygen and are used for blood loss replacement. White blood cells fight against infection. Platelets control bleeding. Plasma helps clot blood. Other blood products are available for specialized needs, such as hemophilia or other clotting disorders. BEFORE THE TRANSFUSION  Who gives blood for transfusions?  You may be able to donate blood to be used at a later date on yourself (autologous donation). Relatives can be asked to donate blood. This is generally not any safer than if you have received blood from a stranger. The same precautions are taken to ensure safety when a relative's blood is donated. Healthy volunteers who are fully evaluated to make sure their blood is safe. This is blood bank blood. Transfusion therapy is the safest it has ever been in the practice of medicine. Before blood is taken from a donor, a complete history is taken to make sure that person has no history of  diseases nor engages in risky social behavior (examples are intravenous drug use or sexual activity with multiple partners). The donor's travel history is screened to minimize risk of transmitting infections, such as malaria. The donated blood is tested for signs of infectious diseases, such as HIV and hepatitis. The blood is then tested to be sure it is compatible with you in order to minimize the chance of a transfusion reaction. If you or a relative donates blood, this is often done in anticipation of surgery and is not appropriate for emergency situations. It takes many days to process the donated blood. RISKS AND COMPLICATIONS Although transfusion therapy is very safe and saves many lives, the main dangers of transfusion include:  Getting an infectious disease. Developing a transfusion reaction. This is an allergic reaction to something in the blood you were given. Every precaution is taken to  prevent this. The decision to have a blood transfusion has been considered carefully by your caregiver before blood is given. Blood is not given unless the benefits outweigh the risks.  AFTER SURGERY INSTRUCTIONS  Return to work: 2-4 weeks if applicable  Activity: 1. Be up and out of the bed during the day.  Take a nap if needed.  You may walk up steps but be careful and use the hand rail.  Stair climbing will tire you more than you think, you may need to stop part way and rest.   2. No lifting or straining for 4 weeks minimum over 10 pounds. No pushing, pulling, straining for 4 weeks.  3. No driving for 1-61 days until the following criteria have been met: Do not drive if you are taking narcotic pain medicine and make sure that your reaction time has returned.   4. You can shower as soon as the next day after surgery. Shower daily.  Use your regular soap and water (not directly on the incision) and pat your incision(s) dry afterwards; don't rub.  No tub baths or submerging your body in water until  cleared by your surgeon. If you have the soap that was given to you by pre-surgical testing that was used before surgery, you do not need to use it afterwards because this can irritate your incisions.   5. No sexual activity and nothing in the vagina for 2 weeks minimum.  6. You may experience a small amount of clear drainage from your incisions, which is normal.  If the drainage persists, increases, or changes color please call the office.  7. Do not use creams, lotions, or ointments such as neosporin on your incisions after surgery until advised by your surgeon because they can cause removal of the dermabond glue on your incisions.    8. Take Tylenol or ibuprofen first for pain if you are able to take these medications and only use narcotic pain medication for severe pain not relieved by the Tylenol or Ibuprofen.  Monitor your Tylenol intake to a max of 4,000 mg in a 24 hour period. You can alternate these medications after surgery.  Diet: 1. Low sodium Heart Healthy Diet is recommended but you are cleared to resume your normal (before surgery) diet after your procedure.  2. It is safe to use a laxative, such as Miralax or Colace, if you have difficulty moving your bowels.   Wound Care: 1. Keep clean and dry.  Shower daily.  Reasons to call the Doctor: Fever - Oral temperature greater than 100.4 degrees Fahrenheit Foul-smelling vaginal discharge Difficulty urinating Nausea and vomiting Increased pain at the site of the incision that is unrelieved with pain medicine. Difficulty breathing with or without chest pain New calf pain especially if only on one side Sudden, continuing increased vaginal bleeding with or without clots.   Contacts: For questions or concerns you should contact:  Dr. Clide Cliff and Dr. Eugene Garnet at [phone]  Warner Mccreedy, NP at [phone]  After Hours: call [phone] and have the GYN Oncologist paged/contacted (after 5 pm or on the  weekends). You will speak with an after hours RN and let he or she know you have had surgery.  Messages sent via mychart are for non-urgent matters and are not responded to after hours so for urgent needs, please call the after hours number.

## 2023-11-06 NOTE — Progress Notes (Signed)
COVID Vaccine Completed:  Yes  Date of COVID positive in last 90 days:  PCP - Henrine Screws, MD Cardiologist - Clotilde Dieter, DO  Chest x-ray -  CT chest 10-30-23 Epic EKG -  Stress Test - 10-24-22 Epic ECHO - 10-24-22 Epic, scheduled for 11-29-23 Cardiac Cath -  Pacemaker/ICD device last checked: Spinal Cord Stimulator: Cardiac CT - 09-26-22 Epic  Bowel Prep -   Sleep Study -  CPAP -   Fasting Blood Sugar -  Checks Blood Sugar _____ times a day  Last dose of GLP1 agonist-  N/A GLP1 instructions:  Hold 7 days before surgery    Last dose of SGLT-2 inhibitors-  N/A SGLT-2 instructions:  Hold 3 days before surgery    Blood Thinner Instructions:  Time Aspirin Instructions: Last Dose:  Activity level:  Can go up a flight of stairs and perform activities of daily living without stopping and without symptoms of chest pain or shortness of breath.  Able to exercise without symptoms  Unable to go up a flight of stairs without symptoms of     Anesthesia review:  AAA, elevated calcium score, HTN  Patient denies shortness of breath, fever, cough and chest pain at PAT appointment  Patient verbalized understanding of instructions that were given to them at the PAT appointment. Patient was also instructed that they will need to review over the PAT instructions again at home before surgery.

## 2023-11-06 NOTE — Patient Instructions (Signed)
SURGICAL WAITING ROOM VISITATION Patients having surgery or a procedure may have no more than 2 support people in the waiting area - these visitors may rotate.    Children under the age of 44 must have an adult with them who is not the patient.  If the patient needs to stay at the hospital during part of their recovery, the visitor guidelines for inpatient rooms apply. Pre-op nurse will coordinate an appropriate time for 1 support person to accompany patient in pre-op.  This support person may not rotate.    Please refer to the [facility] website for the visitor guidelines for Inpatients (after your surgery is over and you are in a regular room).       Your procedure is scheduled on: 11-08-23   Report to [facility] Main Entrance    Report to admitting at 9:45 AM   Call this number if you have problems the morning of surgery [phone]   Follow a light diet the day before surgery, avoid gas producing foods   Do not eat food :After Midnight.   After Midnight you may have the following liquids until 9:00 AM DAY OF SURGERY  Water Non-Citrus Juices (without pulp, NO RED-Apple, White grape, White cranberry) Black Coffee (NO MILK/CREAM OR CREAMERS, sugar ok)  Clear Tea (NO MILK/CREAM OR CREAMERS, sugar ok) regular and decaf                             Plain Jell-O (NO RED)                                           Fruit ices (not with fruit pulp, NO RED)                                     Popsicles (NO RED)                                                               Sports drinks like Gatorade (NO RED)                        If you have questions, please contact your surgeon's office.   FOLLOW  ANY ADDITIONAL PRE OP INSTRUCTIONS YOU RECEIVED FROM YOUR SURGEON'S OFFICE!!!     Oral Hygiene is also important to reduce your risk of infection.                                    Remember - BRUSH YOUR TEETH THE MORNING OF SURGERY WITH YOUR REGULAR TOOTHPASTE   Do NOT  smoke after Midnight   Take these medicines the morning of surgery with A SIP OF WATER:   Levothyroxine  If needed Hydrocodone, Ondansetron  Stop all vitamins and herbal supplements 7 days before surgery  Bring CPAP mask and tubing day of surgery.  You may not have any metal on your body including hair pins, jewelry, and body piercing             Do not wear make-up, lotions, powders, perfumes, or deodorant  Do not wear nail polish including gel and S&S, artificial/acrylic nails, or any other type of covering on natural nails including finger and toenails. If you have artificial nails, gel coating, etc. that needs to be removed by a nail salon please have this removed prior to surgery or surgery may need to be canceled/ delayed if the surgeon/ anesthesia feels like they are unable to be safely monitored.   Do not shave  48 hours prior to surgery.    Do not bring valuables to the hospital. [facility] IS NOT RESPONSIBLE   FOR VALUABLES.   Contacts, dentures or bridgework may not be worn into surgery.  DO NOT BRING YOUR HOME MEDICATIONS TO THE HOSPITAL. PHARMACY WILL DISPENSE MEDICATIONS LISTED ON YOUR MEDICATION LIST TO YOU DURING YOUR ADMISSION IN THE HOSPITAL!    Patients discharged on the day of surgery will not be allowed to drive home.  Someone NEEDS to stay with you for the first 24 hours after anesthesia.   Special Instructions: Bring a copy of your healthcare power of attorney and living will documents the day of surgery if you haven't scanned them before.              Please read over the following fact sheets you were given: IF YOU HAVE QUESTIONS ABOUT YOUR PRE-OP INSTRUCTIONS PLEASE CALL [phone] [facility]  If you received a COVID test during your pre-op visit  it is requested that you wear a mask when out in public, stay away from anyone that may not be feeling well and notify your surgeon if you develop symptoms. If you test positive for Covid or  have been in contact with anyone that has tested positive in the last 10 days please notify you surgeon.   - Preparing for Surgery Before surgery, you can play an important role.  Because skin is not sterile, your skin needs to be as free of germs as possible.  You can reduce the number of germs on your skin by washing with CHG (chlorahexidine gluconate) soap before surgery.  CHG is an antiseptic cleaner which kills germs and bonds with the skin to continue killing germs even after washing. Please DO NOT use if you have an allergy to CHG or antibacterial soaps.  If your skin becomes reddened/irritated stop using the CHG and inform your nurse when you arrive at Short Stay. Do not shave (including legs and underarms) for at least 48 hours prior to the first CHG shower.  You may shave your face/neck.  Please follow these instructions carefully:  1.  Shower with CHG Soap the night before surgery and the  morning of surgery.  2.  If you choose to wash your hair, wash your hair first as usual with your normal  shampoo.  3.  After you shampoo, rinse your hair and body thoroughly to remove the shampoo.                             4.  Use CHG as you would any other liquid soap.  You can apply chg directly to the skin and wash.  Gently with a scrungie or clean washcloth.  5.  Apply the CHG Soap to your body ONLY FROM THE  NECK DOWN.   Do   not use on face/ open                           Wound or open sores. Avoid contact with eyes, ears mouth and   genitals (private parts).                       Wash face,  Genitals (private parts) with your normal soap.             6.  Wash thoroughly, paying special attention to the area where your    surgery  will be performed.  7.  Thoroughly rinse your body with warm water from the neck down.  8.  DO NOT shower/wash with your normal soap after using and rinsing off the CHG Soap.                9.  Pat yourself dry with a clean towel.            10.  Wear  clean pajamas.            11.  Place clean sheets on your bed the night of your first shower and do not  sleep with pets. Day of Surgery : Do not apply any lotions/deodorants the morning of surgery.  Please wear clean clothes to the hospital/surgery center.  FAILURE TO FOLLOW THESE INSTRUCTIONS MAY RESULT IN THE CANCELLATION OF YOUR SURGERY  PATIENT SIGNATURE_________________________________  NURSE SIGNATURE__________________________________  ________________________________________________________________________   WHAT IS A BLOOD TRANSFUSION? Blood Transfusion Information  A transfusion is the replacement of blood or some of its parts. Blood is made up of multiple cells which provide different functions. Red blood cells carry oxygen and are used for blood loss replacement. White blood cells fight against infection. Platelets control bleeding. Plasma helps clot blood. Other blood products are available for specialized needs, such as hemophilia or other clotting disorders. BEFORE THE TRANSFUSION  Who gives blood for transfusions?  Healthy volunteers who are fully evaluated to make sure their blood is safe. This is blood bank blood. Transfusion therapy is the safest it has ever been in the practice of medicine. Before blood is taken from a donor, a complete history is taken to make sure that person has no history of diseases nor engages in risky social behavior (examples are intravenous drug use or sexual activity with multiple partners). The donor's travel history is screened to minimize risk of transmitting infections, such as malaria. The donated blood is tested for signs of infectious diseases, such as HIV and hepatitis. The blood is then tested to be sure it is compatible with you in order to minimize the chance of a transfusion reaction. If you or a relative donates blood, this is often done in anticipation of surgery and is not appropriate for emergency situations. It takes many days to  process the donated blood. RISKS AND COMPLICATIONS Although transfusion therapy is very safe and saves many lives, the main dangers of transfusion include:  Getting an infectious disease. Developing a transfusion reaction. This is an allergic reaction to something in the blood you were given. Every precaution is taken to prevent this. The decision to have a blood transfusion has been considered carefully by your caregiver before blood is given. Blood is not given unless the benefits outweigh the risks. AFTER THE TRANSFUSION Right after receiving a blood transfusion, you will usually feel much better and more  energetic. This is especially true if your red blood cells have gotten low (anemic). The transfusion raises the level of the red blood cells which carry oxygen, and this usually causes an energy increase. The nurse administering the transfusion will monitor you carefully for complications. HOME CARE INSTRUCTIONS  No special instructions are needed after a transfusion. You may find your energy is better. Speak with your caregiver about any limitations on activity for underlying diseases you may have. SEEK MEDICAL CARE IF:  Your condition is not improving after your transfusion. You develop redness or irritation at the intravenous (IV) site. SEEK IMMEDIATE MEDICAL CARE IF:  Any of the following symptoms occur over the next 12 hours: Shaking chills. You have a temperature by mouth above 102 F (38.9 C), not controlled by medicine. Chest, back, or muscle pain. People around you feel you are not acting correctly or are confused. Shortness of breath or difficulty breathing. Dizziness and fainting. You get a rash or develop hives. You have a decrease in urine output. Your urine turns a dark color or changes to pink, red, or brown. Any of the following symptoms occur over the next 10 days: You have a temperature by mouth above 102 F (38.9 C), not controlled by medicine. Shortness of  breath. Weakness after normal activity. The white part of the eye turns yellow (jaundice). You have a decrease in the amount of urine or are urinating less often. Your urine turns a dark color or changes to pink, red, or brown. Document Released: 11/25/2000 Document Revised: 02/20/2012 Document Reviewed: 07/14/2008 [facility] Patient Information 2014 [place], [place].  _______________________________________________________________________

## 2023-11-06 NOTE — Progress Notes (Signed)
Gynecologic Oncology Return Clinic Visit  Date of Service: 11/06/2023 Referring Provider: Henrine Screws, MD [address] [place],  [place] [address]  Assessment & Plan: Lori Conrad is a 77 y.o. woman with imaging findings concerning for an ovarian mass with peritoneal implants along the rectosigmoid colon, and 1.6cm PA lymph node near the renal vessels who presents for treatment discussion today.  Summarized workup to date.  Based on MRI pelvis, the mass seems more suspicious for an ovarian mass.  She additionally has concern for peritoneal implants along the rectosigmoid colon.  On sigmoidoscopy, there is no evidence of direct invasion through to the mucosal surface, and as such, no biopsy was able to be obtained on sigmoidoscopy.  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  Given the high PA lymph node as well as concern for involvement of the rectum that would require a rectosigmoid resection, we have discussed proceeding with diagnostic laparoscopy and biopsy to obtain a tissue diagnosis and proceed with neoadjuvant chemotherapy prior to debulking surgery.  Patient is amenable to this plan.  Patient was consented for: diagnostic laparoscopy and biopsy on 11/08/23 with Dr. Pricilla Holm.  The risks of surgery were discussed in detail and she understands these to including but not limited to bleeding requiring a blood transfusion, infection, injury to adjacent organs (including but not limited to the bowels, bladder, ureters, nerves, blood vessels), thromboembolic events, wound separation, unforseen complication, and possible need for re-exploration.  If the patient experiences any of these events, she understands that her hospitalization or recovery may be prolonged and that she may need to take additional medications for a prolonged period. The patient will receive DVT and antibiotic prophylaxis as indicated. She voiced a clear understanding. She had the  opportunity to ask questions and informed consent was obtained today. She wishes to proceed.  She does not require preoperative clearance. Her METs are >4.  All preoperative instructions were reviewed. Postoperative expectations were also reviewed. Written handouts were provided to the patient.  RTC postop.  Clide Cliff, MD Gynecologic Oncology   Medical Decision Making I personally spent  TOTAL 45 minutes face-to-face and non-face-to-face in the care of this patient, which includes all pre, intra, and post visit time on the date of service.  ----------------------- Reason for Visit: Follow-up  Treatment History: Presented to the [facility] ER on 10/30/2023 with right sided abdominal pain. She reported the pain as severe, sharp, starting early that am. She reported nausea with no emesis. Reports last BM was large output after the PO contrast from CT on 10/26/23. Up until presentation, she was eating/drinking at home without issue. Labs in the ER included lipase at 56, WBC 11.3, Hgb 14.9, Hct 47, UA with trace leukocytes. CT AP imaging was performed on 10/30/2023 and compared with recent CT on 10/26/23 showed an area anterior to the cervix that appears to obliterate the fat plane between the cervix and the posterior wall of the bladder, concerning for cervical mass.  Soft tissue nodules in the cul-de-sac concerning for peritoneal nodularity.  16mm portacaval lymph node.   Of note, she saw her primary care provider for her annual examination and was noted to have a palpable abdominal mass. She underwent a pelvic ultrasound revealing an apparent solid mass extending into the lumen of the bladder on the right. To further evaluate, she underwent a CT AP on 10/26/2023 with similar findings as above an indeterminate 0.9 cm left hepatic lobe hypodense lesion.  She was  seen for new consultation inpatient on 10/30/2023.  During her hospitalization, she underwent a CT chest which was negative for metastatic  disease.  MR abdomen showed benign appearing liver lesions.  An endometrial biopsy was performed and negative.  Tumor markers were collected and demonstrated a CA125 of 401, normal CEA of 3.5, and normal CA 19-9 of 11.  A sigmoidoscopy was performed which showed multiple large mouth diverticula and significant luminal narrowing but no obvious sigmoid extension of pelvic mass seen.  She was discharged to outpatient follow-up with plan for outpatient diagnostic laparoscopy and biopsy for tissue diagnosis.  Interval History: Presents today with her son.  Has not been having pain at home and not needing any pain medications.  Having regular bowel movements.  Denies nausea or vomiting.  Eating normally.   Past Medical/Surgical History: Past Medical History:  Diagnosis Date   Hyperlipidemia    Hypothyroidism    Macular degeneration     Past Surgical History:  Procedure Laterality Date   ANKLE FRACTURE SURGERY Left    BROW LIFT Bilateral 05/17/2022   Procedure: BILATERAL UPPER BLEPHAROPLASTY WITH PTOSIS REPAIR;  Surgeon: Janne Napoleon, MD;  Location: [facility] OR;  Service: Plastics;  Laterality: Bilateral;  1.5 hours   BUNIONECTOMY Left    EYE SURGERY     FLEXIBLE SIGMOIDOSCOPY N/A 11/02/2023   Procedure: FLEXIBLE SIGMOIDOSCOPY;  Surgeon: Kerin Salen, MD;  Location: [facility] ENDOSCOPY;  Service: Gastroenterology;  Laterality: N/A;   IR IMAGING GUIDED PORT INSERTION  11/01/2023   REFRACTIVE SURGERY     laser for macular degeneration   SHOULDER ARTHROSCOPY     TONSILLECTOMY      Family History  Problem Relation Age of Onset   Lung cancer Mother    Stroke Mother 65   Alzheimer's disease Father    CAD Father        95% occlusion of L Main & RCA, severe descending aorta, arterial & arteriolonephrosclerosis    Cholecystitis Father    Esophagitis Father    Pancreatic cancer Daughter    BRCA 1/2 Neg Hx    Breast cancer Neg Hx    Colon cancer Neg Hx    Ovarian cancer Neg Hx    Endometrial cancer  Neg Hx    Prostate cancer Neg Hx     Social History   Socioeconomic History   Marital status: Widowed    Spouse name: Not on file   Number of children: 2   Years of education: Not on file   Highest education level: Not on file  Occupational History   Not on file  Tobacco Use   Smoking status: Former    Current packs/day: 0.00    Types: Cigarettes    Quit date: 05/15/1988    Years since quitting: 35.5   Smokeless tobacco: Never  Vaping Use   Vaping status: Never Used  Substance and Sexual Activity   Alcohol use: Yes    Alcohol/week: 14.0 standard drinks of alcohol    Types: 14 drink(s) per week    Comment: white wine   Drug use: No   Sexual activity: Not Currently    Partners: Male  Other Topics Concern   Not on file  Social History Narrative   Not on file   Social Determinants of Health   Financial Resource Strain: Not on file  Food Insecurity: No Food Insecurity (10/30/2023)   Hunger Vital Sign    Worried About Running Out of Food in the Last Year: Never true  Ran Out of Food in the Last Year: Never true  Transportation Needs: No Transportation Needs (10/30/2023)   PRAPARE - Administrator, Civil Service (Medical): No    Lack of Transportation (Non-Medical): No  Physical Activity: Not on file  Stress: Not on file  Social Connections: Not on file    Current Medications:  Current Outpatient Medications:    atorvastatin (LIPITOR) 40 MG tablet, Take 40 mg by mouth at bedtime., Disp: , Rfl:    Calcium Carb-Cholecalciferol (SUPER CALCIUM 600 + D 400 PO), Take 1 tablet by mouth daily., Disp: , Rfl:    Cholecalciferol (VITAMIN D) 50 MCG (2000 UT) tablet, Take 2,000 Units by mouth daily., Disp: , Rfl:    Coenzyme Q10 (COQ10) 400 MG CAPS, Take 400 mg by mouth daily., Disp: , Rfl:    doxylamine, Sleep, (UNISOM) 25 MG tablet, Take 25 mg by mouth at bedtime as needed for sleep., Disp: , Rfl:    Glucosamine-Chondroit-Vit C-Mn (GLUCOSAMINE 1500 COMPLEX PO),  Take 1 tablet by mouth daily., Disp: , Rfl:    HYDROcodone-acetaminophen (NORCO/VICODIN) 5-325 MG tablet, Take 1-2 tablets by mouth every 4 (four) hours as needed for moderate pain (pain score 4-6) or severe pain (pain score 7-10)., Disp: 30 tablet, Rfl: 0   levothyroxine (SYNTHROID) 75 MCG tablet, Take 75 mcg by mouth daily before breakfast., Disp: , Rfl:    Magnesium 250 MG TABS, Take 250 mg by mouth daily., Disp: , Rfl:    meloxicam (MOBIC) 7.5 MG tablet, Take 7.5 mg by mouth daily., Disp: , Rfl:    Multiple Vitamins-Minerals (PRESERVISION AREDS 2+MULTI VIT PO), Take 1 tablet by mouth 2 (two) times daily., Disp: , Rfl:    Omega-3 Fatty Acids (OMEGA 3 500) 500 MG CAPS, Take 500 mg by mouth daily., Disp: , Rfl:    ondansetron (ZOFRAN) 4 MG tablet, Take 1 tablet (4 mg total) by mouth every 8 (eight) hours as needed for nausea or vomiting., Disp: 30 tablet, Rfl: 0   polyethylene glycol (MIRALAX / GLYCOLAX) 17 g packet, Take 17 g by mouth daily as needed for mild constipation., Disp: 14 each, Rfl: 0   senna-docusate (SENOKOT-S) 8.6-50 MG tablet, Take 1 tablet by mouth 2 (two) times daily., Disp: 180 tablet, Rfl: 0  Review of Symptoms: Complete 10-system review is negative except as above in Interval History.  Physical Exam: There were no vitals taken for this visit. General: Alert, oriented, no acute distress. HEENT: Normocephalic, atraumatic. Neck symmetric without masses. Sclera anicteric.  Chest: Normal work of breathing. Port site clean. Bruising. Glue at surgical sites Cardiovascular: Regular rate and rhythm. Extremities: Grossly normal range of motion.  Warm, well perfused.  No edema bilaterally. Skin: No rashes or lesions noted.   Laboratory & Radiologic Studies: Surgical pathology (10/31/23): A. ENDOMETRIUM, BIOPSY:  -  Scant superficial fragments of Endo/ectocervical mucosa and atrophic  strips of endometrium in the background of mucus and blood.    CT CHEST W CONTRAST  10/30/2023  Narrative CLINICAL DATA:  77 year old female with suspected gynecologic malignancy.  * Tracking Code: BO *  EXAM: CT CHEST WITH CONTRAST  TECHNIQUE: Multidetector CT imaging of the chest was performed during intravenous contrast administration.  RADIATION DOSE REDUCTION: This exam was performed according to the departmental dose-optimization program which includes automated exposure control, adjustment of the mA and/or kV according to patient size and/or use of iterative reconstruction technique.  CONTRAST:  75mL OMNIPAQUE IOHEXOL 350 MG/ML SOLN  COMPARISON:  None Available.  FINDINGS: Cardiovascular: The heart size is mildly enlarged. No pericardial effusion. Aortic atherosclerosis and coronary artery calcification.  Mediastinum/Nodes: Trachea and esophagus appear unremarkable. The right lobe of thyroid gland appears surgically absent. No mediastinal or hilar adenopathy.  Lungs/Pleura: No pleural effusion identified. Subsegmental atelectasis identified within the lingula and bilateral posterior lung bases. No signs of interstitial edema or airspace consolidation. No suspicious pulmonary nodule identified to suggest lung metastases.  Upper Abdomen: No acute abnormality. Multiple liver cysts. Enlarged lymph node within the portal caval region measures 1.5 cm, image 155/3. Defer to report from CT AP dated 10/30/2019 for and same-day MRI of the abdomen pelvis for further details.  Musculoskeletal: Mild curvature of the thoracic spine and lumbar spine is convex towards the left. Multilevel degenerative disc disease. No acute or suspicious osseous lesions.  IMPRESSION: 1. No signs of metastatic disease to the chest. 2. Areas of subsegmental atelectasis noted within bilateral posterior lung bases and lingula. 3. Enlarged upper abdominal lymph node as above. In the setting of a known malignancy this is concerning for nodal metastasis. 4. Coronary artery  calcifications. 5.  Aortic Atherosclerosis (ICD10-I70.0).   Electronically Signed By: Signa Kell M.D. On: 10/31/2023 05:57   MR PELVIS W WO CONTRAST 10/30/2023  Narrative CLINICAL DATA:  Pelvic mass  EXAM: MRI PELVIS WITHOUT AND WITH CONTRAST  TECHNIQUE: Multiplanar multisequence MR imaging of the pelvis was performed both before and after administration of intravenous contrast.  CONTRAST:  5mL GADAVIST GADOBUTROL 1 MMOL/ML IV SOLN  COMPARISON:  CT 10/30/2019  FINDINGS: Urinary Tract: Bladder is mildly distended with fluid. There is some mass effect along the posterior aspect of the bladder related to the adjacent complex mass. The bladder itself appears grossly intact. No abnormal wall enhancement. Grossly preserved course of the urethra.  Bowel: The visualized bowel in the pelvis is nondilated. However there is lobular masslike area along the distal sigmoid colon with the areas of heterogeneous enhancement. The dominant lesion in this location on series 4, image 29 measures 3.7 by 2.6 cm. There are several adjacent soft tissue nodules as well such as just posterior to the cervix and anterior to the bowel measuring 10 mm on series 22 image 30. Focus superior left lateral series 22, image 26 measures 2.3 x 1.7 cm. Additional foci elsewhere dependently in the pelvis including the presacral space.  Vascular/Lymphatic: Atherosclerotic changes identified along the iliac vessels. No separate nodal enlargement identified.  Reproductive: Uterus measures 8.2 by 2.0 by 3.4 cm. Endometrial stripe is less than 3 mm. Slightly heterogeneous myometrium. Anterior to the uterus is a large complex cystic and solid mass with heterogeneous enhancement of the solid component. Lesion measures 11.5 by 7.6 by 7.4 cm. The more solid component is right lateral inferior with a aggressive in enhancement measuring proximally 7.7 by 5.6 cm. Cephalocaudal length 8.5 cm. The cystic component  more towards the left has a dimensions approaching 8.7 cm. There is surrounding free fluid and edema. Although this more in the central pelvis towards midline a right ovary is not seen as a separate structure. There is what appears to be a small postmenopausal of the left ovary measuring 15 mm. An ovarian neoplasm is a strong consideration.  Other:  Small amount of free fluid in the pelvis.  Edema.  Musculoskeletal: Curvature of the spine. Moderate degenerative changes of the lumbar spine with disc bulging and areas of stenosis greatest at L4-5. Degenerative changes of the pelvis and hips as well.  Study is somewhat limited  due to some artifacts postcontrast axial dataset as well as study being performed as a standard pelvis rather than a gynecologic pelvis exam. Please see separate dictation of abdomen MRI.  IMPRESSION: Large complex cystic and solid pelvic mass centrally measuring up to 11.5 x 7.6 x 7.4 cm. Based on overall appearance this has worrisome for a neoplasm including an ovarian malignancy. Small amount of ascites in the pelvis.  Soft tissue enhancing aggressive nodules along the course of the sigmoid colon as well as in the adjacent fat and presacral spaces. With the larger central mass this very well could be spread of disease to adjacent structures rather than a primary colonic process but correlate with symptoms and if needed colonoscopy.  Please see separate dictation of abdominal MRI.   Electronically Signed By: Karen Kays M.D. On: 10/31/2023 14:01

## 2023-11-06 NOTE — Progress Notes (Signed)
Patient was seen in clinic and asked about her dressing on her recent port placement. Patient states her port was inserted  Wednesday 11/20 and patient was discharged from hospital on Friday 11/22. Port dressing removed and noted that port was still accessed with H 22 gauge with biopatch in place. Port site clean, dry and intact. Port was flushed with saline and blood return noted, heparin flush and deaccessed. Band-Aid applied. Pt tolerated procedure well.

## 2023-11-07 ENCOUNTER — Encounter (HOSPITAL_COMMUNITY)
Admission: RE | Admit: 2023-11-07 | Discharge: 2023-11-07 | Disposition: A | Discharge status: Home / Self Care | Payer: Medicare HMO | Source: Ambulatory Visit | Attending: Gynecologic Oncology | Admitting: Gynecologic Oncology

## 2023-11-07 ENCOUNTER — Telehealth: Payer: Self-pay | Admitting: *Deleted

## 2023-11-07 ENCOUNTER — Encounter (HOSPITAL_COMMUNITY): Payer: Self-pay

## 2023-11-07 ENCOUNTER — Other Ambulatory Visit: Payer: Self-pay

## 2023-11-07 VITALS — BP 147/99 | HR 87 | Temp 98.2°F | Resp 16 | Ht 61.0 in | Wt 106.8 lb

## 2023-11-07 DIAGNOSIS — Z01818 Encounter for other preprocedural examination: Secondary | ICD-10-CM | POA: Insufficient documentation

## 2023-11-07 DIAGNOSIS — R1900 Intra-abdominal and pelvic swelling, mass and lump, unspecified site: Secondary | ICD-10-CM | POA: Insufficient documentation

## 2023-11-07 DIAGNOSIS — I10 Essential (primary) hypertension: Secondary | ICD-10-CM | POA: Insufficient documentation

## 2023-11-07 HISTORY — DX: Unspecified osteoarthritis, unspecified site: M19.90

## 2023-11-07 HISTORY — DX: Thoracic aortic ectasia: I77.810

## 2023-11-07 LAB — BASIC METABOLIC PANEL
Anion gap: 15 (ref 5–15)
BUN: 13 mg/dL (ref 8–23)
CO2: 28 mmol/L (ref 22–32)
Calcium: 9.6 mg/dL (ref 8.9–10.3)
Chloride: 98 mmol/L (ref 98–111)
Creatinine, Ser: 0.63 mg/dL (ref 0.44–1.00)
GFR, Estimated: >60 mL/min (ref >60)
Glucose, Bld: 97 mg/dL (ref 70–99)
Potassium: 4.2 mmol/L (ref 3.5–5.1)
Sodium: 141 mmol/L (ref 135–145)

## 2023-11-07 LAB — CBC
HCT: 47.8 % — ABNORMAL HIGH (ref 36.0–46.0)
Hemoglobin: 15.7 g/dL — ABNORMAL HIGH (ref 12.0–15.0)
MCH: 29.7 pg (ref 26.0–34.0)
MCHC: 32.8 g/dL (ref 30.0–36.0)
MCV: 90.5 fL (ref 80.0–100.0)
Platelets: 195 10*3/uL (ref 150–400)
RBC: 5.28 MIL/uL — ABNORMAL HIGH (ref 3.87–5.11)
RDW: 13.4 % (ref 11.5–15.5)
WBC: 7 10*3/uL (ref 4.0–10.5)
nRBC: 0 % (ref 0.0–0.2)

## 2023-11-07 NOTE — Telephone Encounter (Signed)
Telephone call to check on pre-operative status.  Patient compliant with pre-operative instructions.  Reinforced nothing to eat after midnight. Clear liquids until 0845. Patient to arrive at 0945.  No questions or concerns voiced.  Instructed to call for any needs.

## 2023-11-08 ENCOUNTER — Encounter (HOSPITAL_COMMUNITY): Payer: Self-pay | Admitting: Gynecologic Oncology

## 2023-11-08 ENCOUNTER — Other Ambulatory Visit: Payer: Self-pay

## 2023-11-08 ENCOUNTER — Ambulatory Visit (HOSPITAL_COMMUNITY): Payer: Medicare HMO | Admitting: Anesthesiology

## 2023-11-08 ENCOUNTER — Encounter (HOSPITAL_COMMUNITY)
Admission: RE | Disposition: A | Discharge status: Home / Self Care | Payer: Self-pay | Source: Home / Self Care | Attending: Gynecologic Oncology

## 2023-11-08 ENCOUNTER — Ambulatory Visit (HOSPITAL_COMMUNITY)
Admission: RE | Admit: 2023-11-08 | Discharge: 2023-11-08 | Disposition: A | Discharge status: Home / Self Care | Payer: Medicare HMO | Attending: Gynecologic Oncology | Admitting: Gynecologic Oncology

## 2023-11-08 ENCOUNTER — Ambulatory Visit (HOSPITAL_COMMUNITY): Payer: Self-pay | Admitting: Physician Assistant

## 2023-11-08 DIAGNOSIS — R1900 Intra-abdominal and pelvic swelling, mass and lump, unspecified site: Secondary | ICD-10-CM

## 2023-11-08 DIAGNOSIS — C800 Disseminated malignant neoplasm, unspecified: Secondary | ICD-10-CM | POA: Diagnosis not present

## 2023-11-08 DIAGNOSIS — Z87891 Personal history of nicotine dependence: Secondary | ICD-10-CM | POA: Insufficient documentation

## 2023-11-08 DIAGNOSIS — C495 Malignant neoplasm of connective and soft tissue of pelvis: Secondary | ICD-10-CM | POA: Diagnosis not present

## 2023-11-08 DIAGNOSIS — C763 Malignant neoplasm of pelvis: Secondary | ICD-10-CM | POA: Diagnosis not present

## 2023-11-08 DIAGNOSIS — N83519 Torsion of ovary and ovarian pedicle, unspecified side: Secondary | ICD-10-CM

## 2023-11-08 DIAGNOSIS — I700 Atherosclerosis of aorta: Secondary | ICD-10-CM | POA: Diagnosis not present

## 2023-11-08 DIAGNOSIS — E039 Hypothyroidism, unspecified: Secondary | ICD-10-CM | POA: Insufficient documentation

## 2023-11-08 DIAGNOSIS — R1907 Generalized intra-abdominal and pelvic swelling, mass and lump: Secondary | ICD-10-CM

## 2023-11-08 DIAGNOSIS — E785 Hyperlipidemia, unspecified: Secondary | ICD-10-CM | POA: Diagnosis not present

## 2023-11-08 DIAGNOSIS — I2510 Atherosclerotic heart disease of native coronary artery without angina pectoris: Secondary | ICD-10-CM | POA: Insufficient documentation

## 2023-11-08 DIAGNOSIS — C494 Malignant neoplasm of connective and soft tissue of abdomen: Secondary | ICD-10-CM | POA: Diagnosis not present

## 2023-11-08 DIAGNOSIS — Z79890 Hormone replacement therapy (postmenopausal): Secondary | ICD-10-CM | POA: Insufficient documentation

## 2023-11-08 DIAGNOSIS — I10 Essential (primary) hypertension: Secondary | ICD-10-CM | POA: Insufficient documentation

## 2023-11-08 HISTORY — PX: LAPAROSCOPY: SHX197

## 2023-11-08 LAB — ABO/RH: ABO/RH(D): B POS

## 2023-11-08 SURGERY — LAPAROSCOPY, DIAGNOSTIC
Anesthesia: General

## 2023-11-08 MED ORDER — FENTANYL CITRATE (PF) 250 MCG/5ML IJ SOLN
INTRAMUSCULAR | Status: DC | PRN
  Administered 2023-11-08 (×2): 50 ug via INTRAVENOUS

## 2023-11-08 MED ORDER — ORAL CARE MOUTH RINSE: 15.0000 mL | Freq: Once | OROMUCOSAL | Status: AC

## 2023-11-08 MED ORDER — OXYCODONE HCL 5 MG PO TABS
ORAL_TABLET | ORAL | Status: AC
  Administered 2023-11-08: 5 mg via ORAL
  Filled 2023-11-08: qty 1

## 2023-11-08 MED ORDER — OXYCODONE HCL 5 MG PO TABS: 5.0000 mg | ORAL_TABLET | Freq: Once | ORAL | Status: AC | PRN

## 2023-11-08 MED ORDER — ONDANSETRON HCL 4 MG/2ML IJ SOLN
INTRAMUSCULAR | Status: DC | PRN
  Administered 2023-11-08: 4 mg via INTRAVENOUS

## 2023-11-08 MED ORDER — DEXMEDETOMIDINE HCL IN NACL 80 MCG/20ML IV SOLN
INTRAVENOUS | Status: DC | PRN
  Administered 2023-11-08: 6 ug via INTRAVENOUS

## 2023-11-08 MED ORDER — SODIUM CHLORIDE 0.9% FLUSH
3.0000 mL | Freq: Two times a day (BID) | INTRAVENOUS | Status: DC
Start: 2023-11-08 — End: 2023-11-08

## 2023-11-08 MED ORDER — LIDOCAINE 2% (20 MG/ML) 5 ML SYRINGE
INTRAMUSCULAR | Status: DC | PRN
  Administered 2023-11-08: 100 mg via INTRAVENOUS

## 2023-11-08 MED ORDER — FENTANYL CITRATE PF 50 MCG/ML IJ SOSY
PREFILLED_SYRINGE | INTRAMUSCULAR | Status: AC
  Administered 2023-11-08: 50 ug via INTRAVENOUS
  Filled 2023-11-08: qty 1

## 2023-11-08 MED ORDER — LACTATED RINGERS IV SOLN: INTRAVENOUS | Status: DC | PRN

## 2023-11-08 MED ORDER — ACETAMINOPHEN 325 MG PO TABS: 650.0000 mg | ORAL_TABLET | Freq: Four times a day (QID) | ORAL | Status: DC | PRN

## 2023-11-08 MED ORDER — PHENYLEPHRINE 80 MCG/ML (10ML) SYRINGE FOR IV PUSH (FOR BLOOD PRESSURE SUPPORT)
PREFILLED_SYRINGE | INTRAVENOUS | Status: DC | PRN
  Administered 2023-11-08 (×2): 80 ug via INTRAVENOUS

## 2023-11-08 MED ORDER — ONDANSETRON HCL 4 MG/2ML IJ SOLN: 4.0000 mg | Freq: Once | INTRAMUSCULAR | Status: DC | PRN

## 2023-11-08 MED ORDER — DEXAMETHASONE SODIUM PHOSPHATE 10 MG/ML IJ SOLN
INTRAMUSCULAR | Status: DC | PRN
  Administered 2023-11-08: 5 mg via INTRAVENOUS

## 2023-11-08 MED ORDER — DEXAMETHASONE SODIUM PHOSPHATE 4 MG/ML IJ SOLN: 4.0000 mg | INTRAMUSCULAR | Status: DC

## 2023-11-08 MED ORDER — LACTATED RINGERS IR SOLN
Status: DC | PRN
  Administered 2023-11-08: 1000 mL

## 2023-11-08 MED ORDER — BUPIVACAINE HCL 0.25 % IJ SOLN
INTRAMUSCULAR | Status: AC
  Filled 2023-11-08: qty 1

## 2023-11-08 MED ORDER — FENTANYL CITRATE (PF) 100 MCG/2ML IJ SOLN
INTRAMUSCULAR | Status: AC
  Filled 2023-11-08: qty 2

## 2023-11-08 MED ORDER — OXYCODONE HCL 5 MG/5ML PO SOLN: 5.0000 mg | Freq: Once | ORAL | Status: AC | PRN

## 2023-11-08 MED ORDER — BUPIVACAINE HCL (PF) 0.25 % IJ SOLN
INTRAMUSCULAR | Status: DC | PRN
  Administered 2023-11-08: 20 mL

## 2023-11-08 MED ORDER — FENTANYL CITRATE PF 50 MCG/ML IJ SOSY
PREFILLED_SYRINGE | INTRAMUSCULAR | Status: AC
  Filled 2023-11-08: qty 1

## 2023-11-08 MED ORDER — PROPOFOL 10 MG/ML IV BOLUS
INTRAVENOUS | Status: DC | PRN
  Administered 2023-11-08: 100 mg via INTRAVENOUS
  Administered 2023-11-08: 30 ug/kg/min via INTRAVENOUS

## 2023-11-08 MED ORDER — ROCURONIUM BROMIDE 10 MG/ML (PF) SYRINGE
PREFILLED_SYRINGE | INTRAVENOUS | Status: DC | PRN
  Administered 2023-11-08: 50 mg via INTRAVENOUS

## 2023-11-08 MED ORDER — FENTANYL CITRATE PF 50 MCG/ML IJ SOSY
25.0000 ug | PREFILLED_SYRINGE | INTRAMUSCULAR | Status: DC | PRN
  Administered 2023-11-08: 50 ug via INTRAVENOUS

## 2023-11-08 MED ORDER — SUGAMMADEX SODIUM 200 MG/2ML IV SOLN
INTRAVENOUS | Status: DC | PRN
  Administered 2023-11-08: 200 mg via INTRAVENOUS

## 2023-11-08 MED ORDER — HEPARIN SODIUM (PORCINE) 5000 UNIT/ML IJ SOLN
5000.0000 [IU] | INTRAMUSCULAR | Status: AC
  Administered 2023-11-08: 5000 [IU] via SUBCUTANEOUS
  Filled 2023-11-08: qty 1

## 2023-11-08 MED ORDER — ACETAMINOPHEN 500 MG PO TABS
1000.0000 mg | ORAL_TABLET | ORAL | Status: AC
  Administered 2023-11-08: 1000 mg via ORAL
  Filled 2023-11-08: qty 2

## 2023-11-08 MED ORDER — CHLORHEXIDINE GLUCONATE 0.12 % MT SOLN
15.0000 mL | Freq: Once | OROMUCOSAL | Status: AC
  Administered 2023-11-08: 15 mL via OROMUCOSAL

## 2023-11-08 SURGICAL SUPPLY — 36 items
BAG COUNTER SPONGE SURGICOUNT (BAG) IMPLANT
CABLE HIGH FREQUENCY MONO STRZ (ELECTRODE) IMPLANT
CHLORAPREP W/TINT 26 (MISCELLANEOUS) ×1 IMPLANT
CNTNR URN SCR LID CUP LEK RST (MISCELLANEOUS) IMPLANT
COVER BACK TABLE 60X90IN (DRAPES) IMPLANT
COVER SURGICAL LIGHT HANDLE (MISCELLANEOUS) ×1 IMPLANT
DERMABOND ADVANCED .7 DNX12 (GAUZE/BANDAGES/DRESSINGS) ×1 IMPLANT
ELECT REM PT RETURN 15FT ADLT (MISCELLANEOUS) ×1 IMPLANT
GLOVE BIO SURGEON STRL SZ 6 (GLOVE) ×2 IMPLANT
GLOVE BIO SURGEON STRL SZ 6.5 (GLOVE) ×2 IMPLANT
GOWN STRL REUS W/ TWL LRG LVL3 (GOWN DISPOSABLE) ×2 IMPLANT
HOLDER FOLEY CATH W/STRAP (MISCELLANEOUS) IMPLANT
IRRIG SUCT STRYKERFLOW 2 WTIP (MISCELLANEOUS) ×1
IRRIGATION SUCT STRKRFLW 2 WTP (MISCELLANEOUS) IMPLANT
KIT BASIN OR (CUSTOM PROCEDURE TRAY) ×1 IMPLANT
KIT TURNOVER KIT A (KITS) IMPLANT
MANIPULATOR UTERINE 4.5 ZUMI (MISCELLANEOUS) IMPLANT
PACK ROBOT GYN CUSTOM WL (TRAY / TRAY PROCEDURE) IMPLANT
PAD POSITIONING PINK XL (MISCELLANEOUS) ×1 IMPLANT
SCISSORS LAP 5X35 DISP (ENDOMECHANICALS) IMPLANT
SEALER TISSUE G2 CVD JAW 45CM (ENDOMECHANICALS) IMPLANT
SHEET LAVH (DRAPES) ×1 IMPLANT
SLEEVE Z-THREAD 5X100MM (TROCAR) ×1 IMPLANT
SPIKE FLUID TRANSFER (MISCELLANEOUS) IMPLANT
SUT MNCRL AB 4-0 PS2 18 (SUTURE) ×2 IMPLANT
SYS BAG RETRIEVAL 10MM (BASKET)
SYS RETRIEVAL 5MM INZII UNIV (BASKET)
SYSTEM BAG RETRIEVAL 10MM (BASKET) IMPLANT
SYSTEM RETRIEVL 5MM INZII UNIV (BASKET) IMPLANT
TOWEL OR 17X26 10 PK STRL BLUE (TOWEL DISPOSABLE) ×1 IMPLANT
TOWEL OR NON WOVEN STRL DISP B (DISPOSABLE) ×1 IMPLANT
TRAY FOLEY MTR SLVR 16FR STAT (SET/KITS/TRAYS/PACK) ×1 IMPLANT
TRAY LAPAROSCOPIC (CUSTOM PROCEDURE TRAY) ×1 IMPLANT
TROCAR ADV FIXATION 12X100MM (TROCAR) IMPLANT
TROCAR BALLN 12MMX100 BLUNT (TROCAR) IMPLANT
TROCAR Z-THREAD OPTICAL 5X100M (TROCAR) ×1 IMPLANT

## 2023-11-08 NOTE — Anesthesia Preprocedure Evaluation (Signed)
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    Pulmonary neg pulmonary ROS, former smoker   Pulmonary exam normal breath sounds clear to auscultation       Cardiovascular hypertension, + Peripheral Vascular Disease  Normal cardiovascular exam Rhythm:Regular Rate:Normal     Neuro/Psych negative neurological ROS  negative psych ROS   GI/Hepatic negative GI ROS, Neg liver ROS,,,  Endo/Other  Hypothyroidism    Renal/GU negative Renal ROS  negative genitourinary   Musculoskeletal negative musculoskeletal ROS (+)    Abdominal   Peds negative pediatric ROS (+)  Hematology negative hematology ROS (+)   Anesthesia Other Findings   Reproductive/Obstetrics negative OB ROS                             Anesthesia Physical Anesthesia Plan  ASA: 3  Anesthesia Plan: General   Post-op Pain Management: Minimal or no pain anticipated   Induction: Intravenous  PONV Risk Score and Plan: 3 and Ondansetron, Dexamethasone and Treatment may vary due to age or medical condition  Airway Management Planned: Oral ETT  Additional Equipment:   Intra-op Plan:   Post-operative Plan: Extubation in OR  Informed Consent: I have reviewed the patients History and Physical, chart, labs and discussed the procedure including the risks, benefits and alternatives for the proposed anesthesia with the patient or authorized representative who has indicated his/her understanding and acceptance.     Dental advisory given  Plan Discussed with: CRNA and Surgeon  Anesthesia Plan Comments:        Anesthesia Quick Evaluation

## 2023-11-08 NOTE — Anesthesia Procedure Notes (Signed)
Procedure Name: Intubation Date/Time: 11/08/2023 11:48 AM  Performed by: Randa Evens, CRNAPre-anesthesia Checklist: Patient identified, Emergency Drugs available, Suction available and Patient being monitored Patient Re-evaluated:Patient Re-evaluated prior to induction Oxygen Delivery Method: Circle System Utilized Preoxygenation: Pre-oxygenation with 100% oxygen Induction Type: IV induction Ventilation: Mask ventilation without difficulty Laryngoscope Size: Mac and 3 Grade View: Grade I Tube type: Oral Tube size: 7.0 mm Number of attempts: 1 Airway Equipment and Method: Stylet and Oral airway Placement Confirmation: ETT inserted through vocal cords under direct vision, positive ETCO2 and breath sounds checked- equal and bilateral Secured at: 22 cm Tube secured with: Tape Dental Injury: Teeth and Oropharynx as per pre-operative assessment

## 2023-11-08 NOTE — Patient Instructions (Signed)
Preparing for your Surgery   Plan for surgery on 11/08/2023 with Dr. Eugene Garnet at [facility]. You will be scheduled for diagnostic laparoscopy (looking into the abdomen through small incisions with a camera), intra-abdominal biopsies.    Pre-operative Testing -(Done, tomorrow) You will receive a phone call from presurgical testing at [facility] to arrange for a pre-operative appointment and lab work.   -Bring your insurance card, copy of an advanced directive if applicable, medication list   -At that visit, you will be asked to sign a consent for a possible blood transfusion in case a transfusion becomes necessary during surgery.  The need for a blood transfusion is rare but having consent is a necessary part of your care.      -You should not be taking blood thinners or aspirin at least ten days prior to surgery unless instructed by your surgeon.   -Do not take supplements such as fish oil (omega 3), red yeast rice, turmeric before your surgery. You want to avoid medications with aspirin in them including headache powders such as BC or Goody's), Excedrin migraine.   Day Before Surgery at Home -You will be asked to take in a light diet the day before surgery. You will be advised you can have clear liquids up until 3 hours before your surgery.     Eat a light diet the day before surgery.  Examples including soups, broths, toast, yogurt, mashed potatoes.  AVOID GAS PRODUCING FOODS AND BEVERAGES. Things to avoid include carbonated beverages (fizzy beverages, sodas), raw fruits and raw vegetables (uncooked), or beans.    If your bowels are filled with gas, your surgeon will have difficulty visualizing your pelvic organs which increases your surgical risks.   Your role in recovery Your role is to become active as soon as directed by your doctor, while still giving yourself time to heal.  Rest when you feel tired. You will be asked to do the following in order to speed  your recovery:   - Cough and breathe deeply. This helps to clear and expand your lungs and can prevent pneumonia after surgery.  - STAY ACTIVE WHEN YOU GET HOME. Do mild physical activity. Walking or moving your legs help your circulation and body functions return to normal. Do not try to get up or walk alone the first time after surgery.   -If you develop swelling on one leg or the other, pain in the back of your leg, redness/warmth in one of your legs, please call the office or go to the Emergency Room to have a doppler to rule out a blood clot. For shortness of breath, chest pain-seek care in the Emergency Room as soon as possible. - Actively manage your pain. Managing your pain lets you move in comfort. We will ask you to rate your pain on a scale of zero to 10. It is your responsibility to tell your doctor or nurse where and how much you hurt so your pain can be treated.   Special Considerations -If you are diabetic, you may be placed on insulin after surgery to have closer control over your blood sugars to promote healing and recovery.  This does not mean that you will be discharged on insulin.  If applicable, your oral antidiabetics will be resumed when you are tolerating a solid diet.   -Your final pathology results from surgery should be available around one week after surgery and the results will be relayed to you when available.   -  Dr. Antionette Char is the surgeon that assists your GYN Oncologist with surgery.  If you end up staying the night, the next day after your surgery you will either see Dr. Pricilla Holm, Dr. Alvester Morin, or Dr. Antionette Char.   -FMLA forms can be faxed to [phone] and please allow 5-7 business days for completion.   Pain Management After Surgery -You can use the hydrocodone/APAP for pain at home. It has tylenol in it so monitor your tylenol intake.    -Make sure that you have Tylenol and Ibuprofen IF YOU ARE ABLE TO TAKE THESE MEDICATIONS at home to use on a  regular basis after surgery for pain control. We recommend alternating the medications every hour to six hours since they work differently and are processed in the body differently for pain relief.   -Review the attached handout on narcotic use and their risks and side effects.    Bowel Regimen -It is important to prevent constipation and drink adequate amounts of liquids. Monitor for constipation and take a laxative as needed.   Risks of Surgery Risks of surgery are low but include bleeding, infection, damage to surrounding structures, re-operation, blood clots, and very rarely death.     Blood Transfusion Information (For the consent to be signed before surgery)   We will be checking your blood type before surgery so in case of emergencies, we will know what type of blood you would need.                                             WHAT IS A BLOOD TRANSFUSION?   A transfusion is the replacement of blood or some of its parts. Blood is made up of multiple cells which provide different functions. Red blood cells carry oxygen and are used for blood loss replacement. White blood cells fight against infection. Platelets control bleeding. Plasma helps clot blood. Other blood products are available for specialized needs, such as hemophilia or other clotting disorders. BEFORE THE TRANSFUSION  Who gives blood for transfusions?  You may be able to donate blood to be used at a later date on yourself (autologous donation). Relatives can be asked to donate blood. This is generally not any safer than if you have received blood from a stranger. The same precautions are taken to ensure safety when a relative's blood is donated. Healthy volunteers who are fully evaluated to make sure their blood is safe. This is blood bank blood. Transfusion therapy is the safest it has ever been in the practice of medicine. Before blood is taken from a donor, a complete history is taken to make sure that person has no  history of diseases nor engages in risky social behavior (examples are intravenous drug use or sexual activity with multiple partners). The donor's travel history is screened to minimize risk of transmitting infections, such as malaria. The donated blood is tested for signs of infectious diseases, such as HIV and hepatitis. The blood is then tested to be sure it is compatible with you in order to minimize the chance of a transfusion reaction. If you or a relative donates blood, this is often done in anticipation of surgery and is not appropriate for emergency situations. It takes many days to process the donated blood. RISKS AND COMPLICATIONS Although transfusion therapy is very safe and saves many lives, the main dangers of transfusion include:  Getting  an infectious disease. Developing a transfusion reaction. This is an allergic reaction to something in the blood you were given. Every precaution is taken to prevent this. The decision to have a blood transfusion has been considered carefully by your caregiver before blood is given. Blood is not given unless the benefits outweigh the risks.   AFTER SURGERY INSTRUCTIONS   Return to work: 2-4 weeks if applicable   Activity: 1. Be up and out of the bed during the day.  Take a nap if needed.  You may walk up steps but be careful and use the hand rail.  Stair climbing will tire you more than you think, you may need to stop part way and rest.    2. No lifting or straining for 4 weeks minimum over 10 pounds. No pushing, pulling, straining for 4 weeks.   3. No driving for 1-61 days until the following criteria have been met: Do not drive if you are taking narcotic pain medicine and make sure that your reaction time has returned.    4. You can shower as soon as the next day after surgery. Shower daily.  Use your regular soap and water (not directly on the incision) and pat your incision(s) dry afterwards; don't rub.  No tub baths or submerging your body in  water until cleared by your surgeon. If you have the soap that was given to you by pre-surgical testing that was used before surgery, you do not need to use it afterwards because this can irritate your incisions.    5. No sexual activity and nothing in the vagina for 2 weeks minimum.   6. You may experience a small amount of clear drainage from your incisions, which is normal.  If the drainage persists, increases, or changes color please call the office.   7. Do not use creams, lotions, or ointments such as neosporin on your incisions after surgery until advised by your surgeon because they can cause removal of the dermabond glue on your incisions.     8. Take Tylenol or ibuprofen first for pain if you are able to take these medications and only use narcotic pain medication for severe pain not relieved by the Tylenol or Ibuprofen.  Monitor your Tylenol intake to a max of 4,000 mg in a 24 hour period. You can alternate these medications after surgery.   Diet: 1. Low sodium Heart Healthy Diet is recommended but you are cleared to resume your normal (before surgery) diet after your procedure.   2. It is safe to use a laxative, such as Miralax or Colace, if you have difficulty moving your bowels.    Wound Care: 1. Keep clean and dry.  Shower daily.   Reasons to call the Doctor: Fever - Oral temperature greater than 100.4 degrees Fahrenheit Foul-smelling vaginal discharge Difficulty urinating Nausea and vomiting Increased pain at the site of the incision that is unrelieved with pain medicine. Difficulty breathing with or without chest pain New calf pain especially if only on one side Sudden, continuing increased vaginal bleeding with or without clots.   Contacts: For questions or concerns you should contact:   Dr. Clide Cliff and Dr. Eugene Garnet at [phone]   Warner Mccreedy, NP at [phone]   After Hours: call [phone] and have the GYN Oncologist paged/contacted  (after 5 pm or on the weekends). You will speak with an after hours RN and let he or she know you have had surgery.   Messages sent via mychart are  for non-urgent matters and are not responded to after hours so for urgent needs, please call the after hours number.

## 2023-11-08 NOTE — Interval H&P Note (Signed)
History and Physical Interval Note:  11/08/2023 11:25 AM  Lori Conrad  has presented today for surgery, with the diagnosis of PELVIC MASS, SUSPECTED MALIGNANCY.  The various methods of treatment have been discussed with the patient and family. After consideration of risks, benefits and other options for treatment, the patient has consented to  Procedure(s): LAPAROSCOPY DIAGNOSTIC WITH BIOPSY (N/A)  Possible robotic excision of pelvic mass And any other indicated procedures  as a surgical intervention.  The patient's history has been reviewed, patient examined, no change in status, stable for surgery.  I have reviewed the patient's chart and labs.  Questions were answered to the patient's satisfaction.    Discussed plan to send biopsy for frozen to assure it will give [place] a diagnosis. If nothing to biopsy, discussed possible plan to excision pelvic mass (would attempt this robotically).  Carver Fila

## 2023-11-08 NOTE — Op Note (Signed)
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   Anesthesia: GET  Urine Output: 300 cc  Operative Findings: On EUA, small mobile uterus, mobile adnexal mass in the midline into the right.  There is significant nodularity within the cul-de-sac, better appreciated on rectovaginal exam.  It approximately 8 cm from the anus, there is nodularity appreciated and I am unable to pass a finger through the rectum at this location.  On intra-abdominal entry, minimal green-tinged ascites.  Normal upper abdominal survey including liver edges, diaphragm, and stomach.  No obvious disease in the omentum.  Right ovary is replaced by a 14 cm partially cystic, partially solid appearing mass that does not appear necrotic.  The adnexa is torsed.  Fallopian tube is markedly abnormal appearing.  Atrophic appearing left fallopian tube and ovary.  Uterus 6 cm and normal in appearance.  There is minimal carcinomatosis with peritoneal implants along the right pelvic brim, anterior abdominal wall, and deep right pelvic sidewall.  Sigmoid is densely adherent to the posterior cul-de-sac with a loop tethered there.  At this location and at several other areas around the sigmoid colon and high rectum there are multiple, 5 mm - 10 mm firm nodules concerning for metastatic disease.  There is almost complete obliteration of the posterior cul-de-sac. Frozen section consistent with high-grade carcinoma.  Estimated Blood Loss:  25 cc      Total IV Fluids:  see I&O flowsheet         Specimens: right pelvic side wall peritoneum, anterior abdominal wall peritoneum, ascites         Complications:  None apparent; patient tolerated the procedure well.         Disposition: PACU - hemodynamically stable.  Procedure Details  The patient was seen in the Holding Room. The risks, benefits, complications, treatment options, and expected outcomes were discussed with the patient.  The patient concurred with the proposed plan, giving informed consent.  The site of surgery properly noted/marked. The patient was identified as Physiological scientist and the procedure verified as a diagnostic laparoscopy with biopsies.   After induction of anesthesia, the patient was draped and prepped in the usual sterile manner. Patient was placed in supine position after anesthesia and draped and prepped in the usual sterile manner as follows: Her arms were tucked to her side with all appropriate precautions.  The patient was secured to the bed with padding and tape across her chest.  The patient was placed in the semi-lithotomy position in [place] stirrups.  The perineum and vagina were prepped with CHG. The patient's abdomen was prepped with ChloraPrep and then she was draped after the prep had been allowed to dry for 3 minutes.  A Time Out was held and the above information confirmed.  The urethra was prepped with Betadine. Foley catheter was placed.  OG tube placement was confirmed and to suction.   Next, a 5 mm skin incision was made 1 cm below the subcostal margin in the midclavicular line.  The 5 mm Optiview port and scope was used for direct entry.  Opening pressure was under 10 mm CO2.  The  abdomen was insufflated and the findings were noted as above.   At this point and all points during the procedure, the patient's intra-abdominal pressure did not exceed 15 mmHg. The patient was placed in steep Trendelenburg.  The abdomen and pelvis were surveyed with findings noted above.   Under  direct visualization, two additional 5 mm incisions were made and trocars placed above the umbilicus and in the right mid abdomen. Monopolar electrocautery was used to biopsy peritoneum along the right pelvic brim and the anterior abdominal wall where peritoneal nodules were visible. Frozen section confirmed that there was high grade malignancy in these biopsy specimens.  At this point, the decision was made to complete the procedure. Given no abdominal symptoms since her original presentation, I did not want to make a larger incision (which would have been necessary to remove the right adnexa) and risk delaying start of chemotherapy. Excellent hemostasis was noted. The patient was flattened and in so doing, it appeared that the right adnexa was no longer torsed around the IP ligament. Laparoscopic instruments were removed. The subcuticular tissue was closed with 4-0 Vicryl and the skin was closed with 4-0 Monocryl in a subcuticular manner.  Dermabond was applied.    Foley catheter was removed.  All sponge, lap and needle counts were correct x  3.   The patient was transferred to the recovery room in stable condition.  Eugene Garnet, MD

## 2023-11-08 NOTE — Discharge Instructions (Signed)
AFTER SURGERY INSTRUCTIONS   Return to work: 2-4 weeks if applicable   Activity: 1. Be up and out of the bed during the day.  Take a nap if needed.  You may walk up steps but be careful and use the hand rail.  Stair climbing will tire you more than you think, you may need to stop part way and rest.    2. No lifting or straining for 4 weeks minimum over 10 pounds. No pushing, pulling, straining for 4 weeks.   3. No driving for 1-61 days until the following criteria have been met: Do not drive if you are taking narcotic pain medicine and make sure that your reaction time has returned.    4. You can shower as soon as the next day after surgery. Shower daily.  Use your regular soap and water (not directly on the incision) and pat your incision(s) dry afterwards; don't rub.  No tub baths or submerging your body in water until cleared by your surgeon. If you have the soap that was given to you by pre-surgical testing that was used before surgery, you do not need to use it afterwards because this can irritate your incisions.    5. No sexual activity and nothing in the vagina for 2 weeks minimum.   6. You may experience a small amount of clear drainage from your incisions, which is normal.  If the drainage persists, increases, or changes color please call the office.   7. Do not use creams, lotions, or ointments such as neosporin on your incisions after surgery until advised by your surgeon because they can cause removal of the dermabond glue on your incisions.     8. Take Tylenol or ibuprofen first for pain if you are able to take these medications and only use narcotic pain medication for severe pain not relieved by the Tylenol or Ibuprofen.  Monitor your Tylenol intake to a max of 4,000 mg in a 24 hour period. You can alternate these medications after surgery.   Diet: 1. Low sodium Heart Healthy Diet is recommended but you are cleared to resume your normal (before surgery) diet after your  procedure.   2. It is safe to use a laxative, such as Miralax or Colace, if you have difficulty moving your bowels.    Wound Care: 1. Keep clean and dry.  Shower daily.   Reasons to call the Doctor: Fever - Oral temperature greater than 100.4 degrees Fahrenheit Foul-smelling vaginal discharge Difficulty urinating Nausea and vomiting Increased pain at the site of the incision that is unrelieved with pain medicine. Difficulty breathing with or without chest pain New calf pain especially if only on one side Sudden, continuing increased vaginal bleeding with or without clots.   Contacts: For questions or concerns you should contact:   Dr. Clide Cliff and Dr. Eugene Garnet at [phone]   Warner Mccreedy, NP at [phone]   After Hours: call [phone] and have the GYN Oncologist paged/contacted (after 5 pm or on the weekends). You will speak with an after hours RN and let he or she know you have had surgery.   Messages sent via mychart are for non-urgent matters and are not responded to after hours so for urgent needs, please call the after hours number.

## 2023-11-08 NOTE — Transfer of Care (Signed)
Immediate Anesthesia Transfer of Care Note  Patient: Lori Conrad  Procedure(s) Performed: LAPAROSCOPY DIAGNOSTIC WITH BIOPSY  Patient Location: PACU  Anesthesia Type:General  Level of Consciousness: drowsy  Airway & Oxygen Therapy: Patient Spontanous Breathing and Patient connected to nasal cannula oxygen  Post-op Assessment: Report given to RN  Post vital signs: Reviewed and stable  Last Vitals:  Vitals Value Taken Time  BP 106/71 11/08/23 1311  Temp    Pulse 74 11/08/23 1313  Resp 15 11/08/23 1313  SpO2 100 % 11/08/23 1313  Vitals shown include unfiled device data.  Last Pain:  Vitals:   11/08/23 1047  TempSrc:   PainSc: 0-No pain         Complications: No notable events documented.

## 2023-11-08 NOTE — Progress Notes (Signed)
Patient here for follow up and for a pre-operative appointment prior to her scheduled surgery on 11/08/2023. She is scheduled for diagnostic laparoscopy, intra-abdominal biopsies.  She has her pre-admission testing appointment tomorrow at [facility].  The surgery was discussed in detail.  See after visit summary for additional details.    Discussed post-op pain management in detail including the aspects of the enhanced recovery pathway. She has hydrocodone/APAP at home to use if needed. We discussed the use of tylenol post-op and to monitor for a maximum of 4,000 mg in a 24 hour period.  Also prescribed sennakot to be used after surgery and to hold if having loose stools.  Discussed bowel regimen in detail.     Discussed measures to take at home to prevent DVT including frequent mobility.  Reportable signs and symptoms of DVT discussed. Post-operative instructions discussed and expectations for after surgery. Incisional care discussed as well including reportable signs and symptoms including erythema, drainage, wound separation.     10 minutes spent preparing information and with the patient.  Verbalizing understanding of material discussed. No needs or concerns voiced at the end of the visit.   Advised patient to call for any needs.   This appointment is included in the global surgical bundle as pre-operative teaching and has no charge.

## 2023-11-09 ENCOUNTER — Encounter (HOSPITAL_COMMUNITY): Payer: Self-pay | Admitting: Gynecologic Oncology

## 2023-11-09 LAB — TYPE AND SCREEN
ABO/RH(D): B POS
Antibody Screen: NEGATIVE

## 2023-11-13 ENCOUNTER — Telehealth: Payer: Self-pay | Admitting: Oncology

## 2023-11-13 ENCOUNTER — Telehealth: Payer: Self-pay | Admitting: *Deleted

## 2023-11-13 NOTE — Telephone Encounter (Signed)
Spoke with Lori Conrad this morning. She states she is eating, drinking and urinating well. She has had a BM and is passing gas. She is taking senokot as prescribed and encouraged her to drink plenty of water. She denies fever or chills. Incisions are dry and intact. She rates her pain 2/10. She is not taking anything for pain.    Instructed to call office with any fever, chills, purulent drainage, uncontrolled pain or any other questions or concerns. Patient verbalizes understanding.   Pt aware of post op appointments as well as the office number [phone] and after hours number [phone] to call if she has any questions or concerns

## 2023-11-13 NOTE — Telephone Encounter (Signed)
Aliene Altes and scheduled an appointment with Dr. Bertis Ruddy on 11/14/23 at 3:00 to discuss chemotherapy.  Cordelia Pen verbalized understanding and agreement.

## 2023-11-13 NOTE — Anesthesia Postprocedure Evaluation (Signed)
Anesthesia Post Note  Patient: Lori Conrad  Procedure(s) Performed: LAPAROSCOPY DIAGNOSTIC WITH BIOPSY     Patient location during evaluation: Other Anesthesia Type: General Level of consciousness: awake and alert Pain management: pain level controlled Vital Signs Assessment: post-procedure vital signs reviewed and stable Respiratory status: spontaneous breathing, nonlabored ventilation, respiratory function stable and patient connected to nasal cannula oxygen Cardiovascular status: blood pressure returned to baseline and stable Postop Assessment: no apparent nausea or vomiting Anesthetic complications: no  No notable events documented.  Last Vitals:  Vitals:   11/08/23 1358 11/08/23 1421  BP:  (!) 150/85  Pulse: 63 66  Resp: 17 15  Temp:  36.6 C  SpO2: 95% 99%    Last Pain:  Vitals:   11/08/23 1421  TempSrc:   PainSc: 4                  Juvia Aerts S

## 2023-11-13 NOTE — Telephone Encounter (Signed)
Called Sherry back and rescheduled apt to 2:20 tomorrow.

## 2023-11-14 ENCOUNTER — Inpatient Hospital Stay: Payer: Medicare HMO | Attending: Psychiatry | Admitting: Hematology and Oncology

## 2023-11-14 ENCOUNTER — Encounter: Payer: Self-pay | Admitting: Hematology and Oncology

## 2023-11-14 ENCOUNTER — Telehealth: Payer: Self-pay

## 2023-11-14 ENCOUNTER — Ambulatory Visit: Payer: Medicare HMO | Admitting: Hematology and Oncology

## 2023-11-14 VITALS — BP 159/99 | HR 85 | Temp 98.5°F | Resp 18 | Ht 61.0 in | Wt 108.4 lb

## 2023-11-14 DIAGNOSIS — C772 Secondary and unspecified malignant neoplasm of intra-abdominal lymph nodes: Secondary | ICD-10-CM | POA: Diagnosis not present

## 2023-11-14 DIAGNOSIS — Z801 Family history of malignant neoplasm of trachea, bronchus and lung: Secondary | ICD-10-CM | POA: Diagnosis not present

## 2023-11-14 DIAGNOSIS — C786 Secondary malignant neoplasm of retroperitoneum and peritoneum: Secondary | ICD-10-CM | POA: Insufficient documentation

## 2023-11-14 DIAGNOSIS — K5909 Other constipation: Secondary | ICD-10-CM

## 2023-11-14 DIAGNOSIS — R1031 Right lower quadrant pain: Secondary | ICD-10-CM | POA: Diagnosis not present

## 2023-11-14 DIAGNOSIS — C561 Malignant neoplasm of right ovary: Secondary | ICD-10-CM | POA: Diagnosis present

## 2023-11-14 DIAGNOSIS — Z800 Family history of malignant neoplasm of digestive organs: Secondary | ICD-10-CM | POA: Diagnosis not present

## 2023-11-14 DIAGNOSIS — Z87891 Personal history of nicotine dependence: Secondary | ICD-10-CM | POA: Diagnosis not present

## 2023-11-14 DIAGNOSIS — Z5111 Encounter for antineoplastic chemotherapy: Secondary | ICD-10-CM | POA: Insufficient documentation

## 2023-11-14 MED ORDER — LIDOCAINE-PRILOCAINE 2.5-2.5 % EX CREA: TOPICAL_CREAM | CUTANEOUS | 3 refills | Status: DC

## 2023-11-14 MED ORDER — PROCHLORPERAZINE MALEATE 10 MG PO TABS: 10.0000 mg | ORAL_TABLET | Freq: Four times a day (QID) | ORAL | 1 refills | Status: DC | PRN

## 2023-11-14 MED ORDER — ONDANSETRON HCL 8 MG PO TABS: 8.0000 mg | ORAL_TABLET | Freq: Three times a day (TID) | ORAL | 1 refills | Status: DC | PRN

## 2023-11-14 MED ORDER — DEXAMETHASONE 4 MG PO TABS: ORAL_TABLET | ORAL | 6 refills | Status: DC

## 2023-11-14 NOTE — Progress Notes (Signed)
[facility] FOLLOW-UP progress notes  Patient Care Team: Henrine Screws, MD as PCP - General (Family Medicine) Paulina Fusi Servando Snare, RN as Oncology Nurse Navigator (Oncology)  CHIEF COMPLAINTS/PURPOSE OF VISIT:  Newly diagnosed right ovarian cancer, for neoadjuvant treatment She is here accompanied by her son, JJ  HISTORY OF PRESENTING ILLNESS:  Lori Conrad 77 y.o. female was originally seen in the hospital on November 19 for evaluation Please see my note that day for details.  She was evaluated for presentation with abdominal pain and large pelvic mass, suspect GYN primary.  She denies postmenopausal bleeding.  She has strong family history of cancer.  She has 2 children.  Her daughter died 6 years ago from pancreatic cancer at the age of 54.  She was recently widowed.  Her husband died from recent bile duct cancer   She denies significant changes in bowel habits but has noticed intermittent pain and bloating recently.  Her previous colonoscopy was almost 20 years ago.  She had frequent colonoscopy at some point for unknown etiology. 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 Summary of her history is as follows: Oncology History  Malignant ovarian neoplasm, right (HCC)  10/26/2023 Imaging   IR IMAGING GUIDED PORT INSERTION  Result Date: 11/01/2023 INDICATION: 77 year old female with suspected gynecologic malignancy requiring central venous access for chemotherapy. EXAM: IMPLANTED PORT A CATH PLACEMENT WITH ULTRASOUND AND FLUOROSCOPIC GUIDANCE COMPARISON:  None Available. MEDICATIONS: None. ANESTHESIA/SEDATION: Moderate  (conscious) sedation was employed during this procedure. A total of Versed 1 mg and Fentanyl 50 mcg was administered intravenously. Moderate Sedation Time: 15 minutes. The patient's level of consciousness and vital signs were monitored continuously by radiology nursing throughout the procedure under my direct supervision. CONTRAST:  None FLUOROSCOPY TIME:  One mGy COMPLICATIONS: None immediate. PROCEDURE: The procedure, risks, benefits, and alternatives were explained to the patient. Questions regarding the procedure were encouraged and answered. The patient understands and consents to the procedure. The right neck and chest were prepped with chlorhexidine in a sterile fashion, and a sterile drape was applied covering the operative field. Maximum barrier sterile technique with sterile gowns and gloves were used for the procedure. A timeout was performed prior to the initiation of the procedure. Ultrasound was used to examine the jugular vein which was compressible and free of internal echoes. A skin marker was used to demarcate the planned venotomy and port pocket incision sites. Local anesthesia was provided to these sites and the subcutaneous tunnel track with 1% lidocaine with 1:100,000 epinephrine. A small incision was created at the jugular access site and blunt dissection was performed of the subcutaneous tissues. Under ultrasound guidance, the jugular vein was accessed with a 21 ga micropuncture needle and an 0.018" wire was inserted to the superior vena cava. Real-time ultrasound guidance was utilized for vascular access including the acquisition of a permanent ultrasound image documenting patency of the accessed vessel. A 5 Fr micopuncture set was then used, through which a 0.035" Rosen wire was passed under fluoroscopic guidance into the inferior vena cava. An 8 Fr dilator was then placed over the wire. A subcutaneous port pocket was then created along the upper chest wall utilizing  a combination of sharp  and blunt dissection. The pocket was irrigated with sterile saline, packed with gauze, and observed for hemorrhage. A single lumen plastic power injectable port was chosen for placement. The 8 Fr catheter was tunneled from the port pocket site to the venotomy incision. The port was placed in the pocket. The external catheter was trimmed to appropriate length. The dilator was exchanged for an 8 Fr peel-away sheath under fluoroscopic guidance. The catheter was then placed through the sheath and the sheath was removed. Final catheter positioning was confirmed and documented with a fluoroscopic spot radiograph. The port was accessed with a Huber needle, aspirated, and flushed with heparinized saline. The deep dermal layer of the port pocket incision was closed with interrupted 3-0 Vicryl suture. Dermabond was then placed over the port pocket and neck incisions. The patient tolerated the procedure well without immediate post procedural complication. FINDINGS: After catheter placement, the tip lies within the superior cavoatrial junction. The catheter aspirates and flushes normally and is ready for immediate use. IMPRESSION: Successful placement of a power injectable Port-A-Cath via the right internal jugular vein. The catheter is ready for immediate use. Marliss Coots, MD [facility] Electronically Signed   By: Marliss Coots M.D.   On: 11/01/2023 13:22   MR ABDOMEN W WO CONTRAST  Result Date: 10/31/2023 CLINICAL DATA:  Pelvic mass.  Liver lesion EXAM: MRI ABDOMEN WITHOUT AND WITH CONTRAST TECHNIQUE: Multiplanar multisequence MR imaging of the abdomen was performed both before and after the administration of intravenous contrast. CONTRAST:  5mL GADAVIST GADOBUTROL 1 MMOL/ML IV SOLN COMPARISON:  CT 10/30/2023 and older. FINDINGS: Lower chest: Trace pleural fluid. Hepatobiliary: Numerous bright T2, low T1 nonenhancing foci identified consistent with benign  cystic lesions. Many of these are under 15 mm. There are some larger foci identified such as segment 4 measuring 4.3 cm and caudate measuring 2.8 cm. Prior CT did demonstrate 1 lesion which is more complex anteriorly in the left hepatic lobe, segment 3 which on today's examination when taking into account motion show some progressive enhancement, is bright on T2 but not as bright as simple fluid and consistent with a small hemangioma. No specific aggressive liver lesion clearly identified today. Patent portal vein. Gallbladder is nondilated. No biliary ductal dilatation. Pancreas: Ectatic pancreatic duct diffusely measuring up to 6 mm, severe. No pancreatic focal atrophy, abnormal enhancement or abnormal T1 signal. No restricted diffusion along the pancreas. Spleen:  Within normal limits in size and appearance. Adrenals/Urinary Tract: Adrenal glands are preserved. No enhancing renal mass or collecting system dilatation. Stomach/Bowel: Visualized bowel is nondilated. This includes visualized portions of the small and large bowel. The stomach is underdistended. Vascular/Lymphatic: Normal caliber aorta and IVC. Atherosclerotic changes along the aorta. Circumaortic left renal vein. Once again there is a abnormal lymph node identified anterior to the aorta in the upper abdomen on series 1602, image 58 measuring 2.3 x 1.8 cm. Few other small retroperitoneal nodes identified. Other:  Trace ascites.  Mesenteric stranding. Musculoskeletal: Curvature and degenerative changes along the spine. IMPRESSION: Multiple benign-appearing liver lesions including cysts and 1 hemangioma. Persistent enlarged upper abdominal retroperitoneal lymph node. Additional smaller but prominent nodes as well. With the pelvic findings these are worrisome for potential spread of neoplasm. Mild ascites. Electronically Signed   By: Karen Kays M.D.   On: 10/31/2023 14:10   MR PELVIS W WO CONTRAST  Result Date: 10/31/2023 CLINICAL DATA:  Pelvic  mass EXAM: MRI PELVIS WITHOUT AND  WITH CONTRAST TECHNIQUE: Multiplanar multisequence MR imaging of the pelvis was performed both before and after administration of intravenous contrast. CONTRAST:  5mL GADAVIST GADOBUTROL 1 MMOL/ML IV SOLN COMPARISON:  CT 10/30/2019 FINDINGS: Urinary Tract: Bladder is mildly distended with fluid. There is some mass effect along the posterior aspect of the bladder related to the adjacent complex mass. The bladder itself appears grossly intact. No abnormal wall enhancement. Grossly preserved course of the urethra. Bowel: The visualized bowel in the pelvis is nondilated. However there is lobular masslike area along the distal sigmoid colon with the areas of heterogeneous enhancement. The dominant lesion in this location on series 4, image 29 measures 3.7 by 2.6 cm. There are several adjacent soft tissue nodules as well such as just posterior to the cervix and anterior to the bowel measuring 10 mm on series 22 image 30. Focus superior left lateral series 22, image 26 measures 2.3 x 1.7 cm. Additional foci elsewhere dependently in the pelvis including the presacral space. Vascular/Lymphatic: Atherosclerotic changes identified along the iliac vessels. No separate nodal enlargement identified. Reproductive: Uterus measures 8.2 by 2.0 by 3.4 cm. Endometrial stripe is less than 3 mm. Slightly heterogeneous myometrium. Anterior to the uterus is a large complex cystic and solid mass with heterogeneous enhancement of the solid component. Lesion measures 11.5 by 7.6 by 7.4 cm. The more solid component is right lateral inferior with a aggressive in enhancement measuring proximally 7.7 by 5.6 cm. Cephalocaudal length 8.5 cm. The cystic component more towards the left has a dimensions approaching 8.7 cm. There is surrounding free fluid and edema. Although this more in the central pelvis towards midline a right ovary is not seen as a separate structure. There is what appears to be a small  postmenopausal of the left ovary measuring 15 mm. An ovarian neoplasm is a strong consideration. Other:  Small amount of free fluid in the pelvis.  Edema. Musculoskeletal: Curvature of the spine. Moderate degenerative changes of the lumbar spine with disc bulging and areas of stenosis greatest at L4-5. Degenerative changes of the pelvis and hips as well. Study is somewhat limited due to some artifacts postcontrast axial dataset as well as study being performed as a standard pelvis rather than a gynecologic pelvis exam. Please see separate dictation of abdomen MRI. IMPRESSION: Large complex cystic and solid pelvic mass centrally measuring up to 11.5 x 7.6 x 7.4 cm. Based on overall appearance this has worrisome for a neoplasm including an ovarian malignancy. Small amount of ascites in the pelvis. Soft tissue enhancing aggressive nodules along the course of the sigmoid colon as well as in the adjacent fat and presacral spaces. With the larger central mass this very well could be spread of disease to adjacent structures rather than a primary colonic process but correlate with symptoms and if needed colonoscopy. Please see separate dictation of abdominal MRI. Electronically Signed   By: Karen Kays M.D.   On: 10/31/2023 14:01   CT CHEST W CONTRAST  Result Date: 10/31/2023 CLINICAL DATA:  77 year old female with suspected gynecologic malignancy. * Tracking Code: BO * EXAM: CT CHEST WITH CONTRAST TECHNIQUE: Multidetector CT imaging of the chest was performed during intravenous contrast administration. RADIATION DOSE REDUCTION: This exam was performed according to the departmental dose-optimization program which includes automated exposure control, adjustment of the mA and/or kV according to patient size and/or use of iterative reconstruction technique. CONTRAST:  75mL OMNIPAQUE IOHEXOL 350 MG/ML SOLN COMPARISON:  None Available. FINDINGS: Cardiovascular: The heart size is mildly  enlarged. No pericardial effusion.  Aortic atherosclerosis and coronary artery calcification. Mediastinum/Nodes: Trachea and esophagus appear unremarkable. The right lobe of thyroid gland appears surgically absent. No mediastinal or hilar adenopathy. Lungs/Pleura: No pleural effusion identified. Subsegmental atelectasis identified within the lingula and bilateral posterior lung bases. No signs of interstitial edema or airspace consolidation. No suspicious pulmonary nodule identified to suggest lung metastases. Upper Abdomen: No acute abnormality. Multiple liver cysts. Enlarged lymph node within the portal caval region measures 1.5 cm, image 155/3. Defer to report from CT AP dated 10/30/2019 for and same-day MRI of the abdomen pelvis for further details. Musculoskeletal: Mild curvature of the thoracic spine and lumbar spine is convex towards the left. Multilevel degenerative disc disease. No acute or suspicious osseous lesions. IMPRESSION: 1. No signs of metastatic disease to the chest. 2. Areas of subsegmental atelectasis noted within bilateral posterior lung bases and lingula. 3. Enlarged upper abdominal lymph node as above. In the setting of a known malignancy this is concerning for nodal metastasis. 4. Coronary artery calcifications. 5.  Aortic Atherosclerosis (ICD10-I70.0). Electronically Signed   By: Signa Kell M.D.   On: 10/31/2023 05:57   CT ABDOMEN PELVIS W CONTRAST  Result Date: 10/30/2023 CLINICAL DATA:  Abdominal pain. EXAM: CT ABDOMEN AND PELVIS WITH CONTRAST TECHNIQUE: Multidetector CT imaging of the abdomen and pelvis was performed using the standard protocol following bolus administration of intravenous contrast. RADIATION DOSE REDUCTION: This exam was performed according to the departmental dose-optimization program which includes automated exposure control, adjustment of the mA and/or kV according to patient size and/or use of iterative reconstruction technique. CONTRAST:  75mL OMNIPAQUE IOHEXOL 350 MG/ML SOLN COMPARISON:   Limb 1424 FINDINGS: Lower chest: No acute findings. Hepatobiliary: Multiple hepatic cysts evident. Scattered tiny hypodensities in the liver parenchyma are too small to characterize but are statistically most likely benign. No followup imaging is recommended. Tiny subcapsular lesion measured previously at 9 mm in the anterior left liver is stable on image 23/3 today, nonspecific. There is no evidence for gallstones, gallbladder wall thickening, or pericholecystic fluid. No intrahepatic or extrahepatic biliary dilation. Pancreas: Dilatation of the pancreatic duct to the head and body of pancreas is similar to prior. Spleen: No splenomegaly. No suspicious focal mass lesion. Adrenals/Urinary Tract: No adrenal nodule or mass. Kidneys unremarkable. No evidence for hydroureter. Bladder is distended. Stomach/Bowel: Stomach is unremarkable. No gastric wall thickening. No evidence of outlet obstruction. Duodenum is normally positioned as is the ligament of Treitz. No small bowel wall thickening. No small bowel dilatation. Diverticular changes are noted in the left colon without evidence of diverticulitis. Vascular/Lymphatic: 16 mm short axis portal caval lymph node seen on 22/3. No para-aortic lymphadenopathy. No pelvic sidewall lymphadenopathy. Reproductive: Multiple uterine fibroids evident. As noted on prior study there is an area of the anterior cervix that appears to obliterate the fat plane between the cervix and the posterior wall of the bladder (see sagittal 86/7). Small soft tissue nodules are again noted in the cul-de-sac some of which may pertain to diverticuli, but others raise concern for peritoneal nodularity (see images 61 and 56 of series 3). Other: No substantial free fluid. Musculoskeletal: No worrisome lytic or sclerotic osseous abnormality. IMPRESSION: 1. Multiple uterine fibroids. As noted on prior study there is an area of the anterior cervix that appears to obliterate the fat plane between the cervix  and the posterior wall of the bladder. This is concerning for a cervical mass. Gynecologic consultation recommended. 2. Small soft tissue nodules in the cul-de-sac  some of which may relate to diverticuli, but others raise concern for peritoneal nodularity. Attention on follow-up recommended. PET-CT may prove helpful to further evaluate 3. 16 mm short axis portal caval lymph node, metastatic disease not excluded. 4. Left colonic diverticulosis without diverticulitis. Electronically Signed   By: Kennith Center M.D.   On: 10/30/2023 13:08   CT ABDOMEN PELVIS W CONTRAST  Result Date: 10/26/2023 CLINICAL DATA:  Pt w/ abnormal [place]; mass in pelvis; no h/o cancer; no pain; no urinary issues EXAM: CT ABDOMEN AND PELVIS WITH CONTRAST TECHNIQUE: Multidetector CT imaging of the abdomen and pelvis was performed using the standard protocol following bolus administration of intravenous contrast. RADIATION DOSE REDUCTION: This exam was performed according to the departmental dose-optimization program which includes automated exposure control, adjustment of the mA and/or kV according to patient size and/or use of iterative reconstruction technique. CONTRAST:  ISOVUE-300 IOPAMIDOL (ISOVUE-300) INJECTION 61% COMPARISON:  Ultrasound pelvis 09/01/2023, ultrasound thyroid 10/10/2023 FINDINGS: Lower chest: Mitral annular calcification. Aortic valve leaflet calcification. No acute abnormality. Hepatobiliary: Multiple fluid density lesions scattered throughout the left right hepatic lobe. There an indeterminate 0.9 cm left hepatic lobe hypodensity with Hounsfield unit of 77 (2:29). No gallstones, gallbladder wall thickening, or pericholecystic fluid. No biliary dilatation. Pancreas: No focal lesion. Normal pancreatic contour. No surrounding inflammatory changes. No main pancreatic ductal dilatation. Spleen: Normal in size without focal abnormality. Adrenals/Urinary Tract: No adrenal nodule bilaterally. Bilateral kidneys enhance  symmetrically. No hydronephrosis. No hydroureter. The urinary bladder is unremarkable. On delayed imaging, there is no urothelial wall thickening and there are no filling defects in the opacified portions of the bilateral collecting systems or ureters. Stomach/Bowel: Stomach is within normal limits. No evidence of small bowel wall thickening or dilatation. Increased stool burden proximal to the distal sigmoid colon mass with stool throughout the ascending, transverse, descending colon. Short segment of distal sigmoid colon irregular bowel wall thickening (5:26, 2:62). No large bowel luminal dilatation. Colonic diverticulosis appendix appears normal. Vascular/Lymphatic: No abdominal aorta or iliac aneurysm. 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 No abdominal, pelvic, or inguinal lymphadenopathy. Reproductive: There is a heterogeneous solid and cystic 11 x 9 cm mass arising from the uterine fundus. Finding is noted to invade into the urinary bladder dome (6:81, 5:56 close) where there is loss of intraperitoneal and lower ring of the urinary bladder wall margin. The mass is noted to abut and appears to be inseparable from a short segment of distal sigmoid colon in the region of irregular bowel wall thickening. Other: No intraperitoneal free fluid. No intraperitoneal free gas. No organized fluid collection. Musculoskeletal: No abdominal wall hernia or abnormality. No suspicious lytic or blastic osseous lesions. No acute displaced fracture. Multilevel severe degenerative changes of the spine. Grade 1 anterolisthesis of L4 on L5 and L5 on S1. Mild retrolisthesis of L2 on L3 and L3 on L4. Dextroscoliosis centered at the L3-L4 level. IMPRESSION: 1. An 11 x 9 cm heterogeneous solid and cystic mass arises from the uterine fundus and is noted to invade the urinary bladder dome wall as well as the distal sigmoid colon. No associated bowel  obstruction; however, constipation proximal to irregular bowel wall thickening/mass. No associated stercoral colitis. Finding consistent with malignancy. Recommend gynecologic consultation. When the patient is clinically stable and able to follow directions and hold their breath (preferably as an outpatient) further evaluation with dedicated MRI with and without contrast should be considered. 2. Indeterminate  0.9 cm left hepatic lobe hypodense lesion with a density of 77 HU. Question metastasis versus primary hepatic lesion. 3. Stool throughout the colon 4. Colonic diverticulosis with no acute diverticulitis. 5. 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 6.  Aortic Atherosclerosis (ICD10-I70.0)-severe. These results will be called to the ordering clinician or representative by the Radiologist Assistant, and communication documented in the PACS or [employer]. Electronically Signed   By: Tish Frederickson M.D.   On: 10/26/2023 18:42   DG BONE DENSITY (DXA)  Result Date: 10/26/2023 EXAM: DUAL X-RAY ABSORPTIOMETRY (DXA) FOR BONE MINERAL DENSITY IMPRESSION: Referring Physician:  Henrine Screws Your patient completed a bone mineral density test using GE Lunar iDXA system (analysis version: 16). Technologist: BEC PATIENT: Name: Lori Conrad, Lori Conrad Patient ID: [id number] Birth Date: 12/07/1946 Height: 60.5 in. Sex: Female Measured: 10/26/2023 Weight: 110.2 lbs. Indications: Advanced Age, Caucasian, Estrogen Deficient, Height Loss (781.91), History of Osteoporosis, Levothyroxine, Postmenopausal Fractures: Left Ankle Treatments: Calcium (E943.0), Vitamin D (E933.5) ASSESSMENT: The BMD measured at Forearm Radius 33% is 0.624 g/cm2 with a T-score of -2.9. This patient's diagnostic category is OSTEOPOROSIS according to World Health Organization [facility]) criteria. Comparison to 10/16/2019. Since the prior study, there has been a SIGNIFICANT DECREASE in bone mineral density of the hips (-4.6%). The lumbar spine was  excluded due to being excluded from prior exam. The quality of the exam is good. Site Region Measured Date Measured Age YA BMD Significant CHANGE T-score Right Forearm Radius 33% 10/26/2023 78.0 -2.9 0.624 g/cm2 Right Forearm Radius 33% 10/16/2019 74.0 -2.6 0.653 g/cm2 DualFemur Neck Left 10/26/2023 78.0 -0.6 0.952 g/cm2 DualFemur Neck Left 10/16/2019 74.0 -0.5 0.962 g/cm2 DualFemur Total Mean 10/26/2023 78.0 -0.7 0.918 g/cm2 * DualFemur Total Mean 10/16/2019 74.0 -0.4 0.962 g/cm2 World Health Organization [facility]) criteria for post-menopausal, Caucasian Women: Normal       T-score at or above -1 SD Osteopenia   T-score between -1 and -2.5 SD Osteoporosis T-score at or below -2.5 SD RECOMMENDATION: 1. All patients should optimize calcium and vitamin D intake. 2. Consider FDA-approved medical therapies in postmenopausal women and men aged 47 years and older, based on the following: a. A hip or vertebral (clinical or morphometric) fracture. b. T-score = -2.5 at the femoral neck or spine after appropriate evaluation to exclude secondary causes. c. Low bone mass (T-score between -1.0 and -2.5 at the femoral neck or spine) and a 10-year probability of a hip fracture = 3% or a 10-year probability of a major osteoporosis-related fracture = 20% based on the [place]-adapted WHO algorithm. d. Clinician judgment and/or patient preferences may indicate treatment for people with 10-year fracture probabilities above or below these levels. FOLLOW-UP: Patients with diagnosis of osteoporosis or at high risk for fracture should have regular bone mineral density tests.? Patients eligible for Medicare are allowed routine testing every 2 years.? The testing frequency can be increased to one year for patients who have rapidly progressing disease, are receiving or discontinuing medical therapy to restore bone mass, or have additional risk factors. I have reviewed this study and agree with the findings. [facility] Electronically  Signed   By: Harmon Pier M.D.   On: 10/26/2023 12:30      11/08/2023 Pathology Results   SURGICAL PATHOLOGY CASE: [id number] PATIENT: [facility] Surgical Pathology Report  Clinical History: Pelvic mass, suspected malignancy (crm)   FINAL MICROSCOPIC DIAGNOSIS:  A. PELVIC SIDEWALL NODULE, RIGHT, EXCISION: - High-grade serous carcinoma, see comment  B. ABDOMINAL WALL #1, ANTERIOR, EXCISION: -  High-grade serous carcinoma  COMMENT:  A.  Immunohistochemical stain show that the tumor cells are positive for CK7, PAX8, and p16 (diffuse overexpression).  Immunostain for p53 shows a clonal null expression pattern.  Immunostains for CK20 is negative. This immunoprofile is consistent with the above interpretation and suggestive of an ovarian primary.    11/14/2023 Initial Diagnosis   Malignant ovarian neoplasm, right (HCC)   11/14/2023 Cancer Staging   Staging form: Ovary, Fallopian Tube, and Primary Peritoneal Carcinoma, AJCC 8th Edition - Clinical stage from 11/14/2023: FIGO Stage IIIC (cT3c, cN1, cM0) - Signed by Artis Delay, MD on 11/14/2023 Stage prefix: Initial diagnosis   11/22/2023 -  Chemotherapy   Patient is on Treatment Plan : OVARIAN Carboplatin (AUC 6) + Paclitaxel (175) q21d X 6 Cycles       MEDICAL HISTORY:  Past Medical History:  Diagnosis Date   Arthritis    Dilated aortic root (HCC)    Hyperlipidemia    Hypothyroidism    Macular degeneration     SURGICAL HISTORY: Past Surgical History:  Procedure Laterality Date   ANKLE FRACTURE SURGERY Left    BROW LIFT Bilateral 05/17/2022   Procedure: BILATERAL UPPER BLEPHAROPLASTY WITH PTOSIS REPAIR;  Surgeon: Janne Napoleon, MD;  Location: [facility] OR;  Service: Plastics;  Laterality: Bilateral;  1.5 hours   BUNIONECTOMY Left    COLONOSCOPY     EYE SURGERY     FLEXIBLE SIGMOIDOSCOPY N/A 11/02/2023   Procedure: FLEXIBLE SIGMOIDOSCOPY;  Surgeon: Kerin Salen, MD;  Location: [facility] ENDOSCOPY;  Service: Gastroenterology;   Laterality: N/A;   IR IMAGING GUIDED PORT INSERTION  11/01/2023   LAPAROSCOPY N/A 11/08/2023   Procedure: LAPAROSCOPY DIAGNOSTIC WITH BIOPSY;  Surgeon: Carver Fila, MD;  Location: [facility] ORS;  Service: Gynecology;  Laterality: N/A;   REFRACTIVE SURGERY     laser for macular degeneration   SHOULDER ARTHROSCOPY     TONSILLECTOMY      SOCIAL HISTORY: Social History   Socioeconomic History   Marital status: Widowed    Spouse name: Not on file   Number of children: 2   Years of education: Not on file   Highest education level: Not on file  Occupational History   Not on file  Tobacco Use   Smoking status: Former    Current packs/day: 0.00    Types: Cigarettes    Quit date: 05/15/1988    Years since quitting: 35.5   Smokeless tobacco: Never  Vaping Use   Vaping status: Never Used  Substance and Sexual Activity   Alcohol use: Yes    Alcohol/week: 14.0 standard drinks of alcohol    Types: 14 drink(s) per week    Comment: white wine   Drug use: No   Sexual activity: Not Currently    Partners: Male  Other Topics Concern   Not on file  Social History Narrative   Not on file   Social Determinants of Health   Financial Resource Strain: Not on file  Food Insecurity: No Food Insecurity (10/30/2023)   Hunger Vital Sign    Worried About Running Out of Food in the Last Year: Never true    Ran Out of Food in the Last Year: Never true  Transportation Needs: No Transportation Needs (10/30/2023)   PRAPARE - Administrator, Civil Service (Medical): No    Lack of Transportation (Non-Medical): No  Physical Activity: Not on file  Stress: Not on file  Social Connections: Not on file  Intimate Partner Violence: Not At  Risk (10/30/2023)   Humiliation, Afraid, Rape, and Kick questionnaire    Fear of Current or Ex-Partner: No    Emotionally Abused: No    Physically Abused: No    Sexually Abused: No    FAMILY HISTORY: Family History  Problem Relation Age of Onset    Lung cancer Mother    Stroke Mother 37   Alzheimer's disease Father    CAD Father        95% occlusion of L Main & RCA, severe descending aorta, arterial & arteriolonephrosclerosis    Cholecystitis Father    Esophagitis Father    Pancreatic cancer Daughter    BRCA 1/2 Neg Hx    Breast cancer Neg Hx    Colon cancer Neg Hx    Ovarian cancer Neg Hx    Endometrial cancer Neg Hx    Prostate cancer Neg Hx     ALLERGIES:  has No Known Allergies.  MEDICATIONS:  Current Outpatient Medications  Medication Sig Dispense Refill   dexamethasone (DECADRON) 4 MG tablet Take 2 tabs at the night before and 2 tab the morning of chemotherapy, every 3 weeks, by mouth x 6 cycles 24 tablet 6   atorvastatin (LIPITOR) 40 MG tablet Take 40 mg by mouth at bedtime.     Calcium Carb-Cholecalciferol (SUPER CALCIUM 600 + D 400 PO) Take 1 tablet by mouth daily.     Cholecalciferol (VITAMIN D) 50 MCG (2000 UT) tablet Take 2,000 Units by mouth daily.     Coenzyme Q10 (COQ10) 400 MG CAPS Take 400 mg by mouth daily.     doxylamine, Sleep, (UNISOM) 25 MG tablet Take 25 mg by mouth at bedtime as needed for sleep.     Glucosamine-Chondroit-Vit C-Mn (GLUCOSAMINE 1500 COMPLEX PO) Take 1 tablet by mouth daily.     HYDROcodone-acetaminophen (NORCO/VICODIN) 5-325 MG tablet Take 1-2 tablets by mouth every 4 (four) hours as needed for moderate pain (pain score 4-6) or severe pain (pain score 7-10). 30 tablet 0   levothyroxine (SYNTHROID) 75 MCG tablet Take 75 mcg by mouth daily before breakfast.     lidocaine-prilocaine (EMLA) cream Apply to affected area once 30 g 3   Magnesium 250 MG TABS Take 250 mg by mouth daily.     meloxicam (MOBIC) 7.5 MG tablet Take 7.5 mg by mouth daily.     Multiple Vitamins-Minerals (PRESERVISION AREDS 2+MULTI VIT PO) Take 1 tablet by mouth 2 (two) times daily.     Omega-3 Fatty Acids (OMEGA 3 500) 500 MG CAPS Take 500 mg by mouth daily.     ondansetron (ZOFRAN) 4 MG tablet Take 1 tablet (4 mg  total) by mouth every 8 (eight) hours as needed for nausea or vomiting. 30 tablet 0   ondansetron (ZOFRAN) 8 MG tablet Take 1 tablet (8 mg total) by mouth every 8 (eight) hours as needed for nausea or vomiting. Start on the third day after carboplatin. 30 tablet 1   polyethylene glycol (MIRALAX / GLYCOLAX) 17 g packet Take 17 g by mouth daily as needed for mild constipation. 14 each 0   prochlorperazine (COMPAZINE) 10 MG tablet Take 1 tablet (10 mg total) by mouth every 6 (six) hours as needed for nausea or vomiting. 30 tablet 1   senna-docusate (SENOKOT-S) 8.6-50 MG tablet Take 1 tablet by mouth 2 (two) times daily. 180 tablet 0   No current facility-administered medications for this visit.    REVIEW OF SYSTEMS:   Constitutional: Denies fevers, chills or abnormal night sweats  Eyes: Denies blurriness of vision, double vision or watery eyes Ears, nose, mouth, throat, and face: Denies mucositis or sore throat Respiratory: Denies cough, dyspnea or wheezes Cardiovascular: Denies palpitation, chest discomfort or lower extremity swelling Skin: Denies abnormal skin rashes Lymphatics: Denies new lymphadenopathy or easy bruising Neurological:Denies numbness, tingling or new weaknesses Behavioral/Psych: Mood is stable, no new changes  All other systems were reviewed with the patient and are negative.  PHYSICAL EXAMINATION: ECOG PERFORMANCE STATUS: 1 - Symptomatic but completely ambulatory  Vitals:   11/14/23 1434  BP: (!) 159/99  Pulse: 85  Resp: 18  Temp: 98.5 F (36.9 C)  SpO2: 97%   Filed Weights   11/14/23 1434  Weight: 108 lb 6.4 oz (49.2 kg)    GENERAL:alert, no distress and comfortable NEURO: no focal motor/sensory deficits  LABORATORY DATA:  I have reviewed the data as listed Lab Results  Component Value Date   WBC 7.0 11/07/2023   HGB 15.7 (H) 11/07/2023   HCT 47.8 (H) 11/07/2023   MCV 90.5 11/07/2023   PLT 195 11/07/2023   Recent Labs    10/30/23 0739  10/31/23 0740 11/01/23 0527 11/07/23 1140  NA 137 137 138 141  K 3.8 4.2 3.8 4.2  CL 102 102 102 98  CO2 25 26 29 28   GLUCOSE 145* 94 104* 97  BUN 18 17 12 13   CREATININE 0.66 0.64 0.62 0.63  CALCIUM 9.6 9.0 8.9 9.6  GFRNONAA >60 >60 >60 >60  PROT 6.3*  --   --   --   ALBUMIN 3.8  --   --   --   AST 29  --   --   --   ALT 16  --   --   --   ALKPHOS 66  --   --   --   BILITOT 0.9  --   --   --     RADIOGRAPHIC STUDIES: I have reviewed CT imaging with the patient and her son I have personally reviewed the radiological images as listed and agreed with the findings in the report. IR IMAGING GUIDED PORT INSERTION  Result Date: 11/01/2023 INDICATION: 77 year old female with suspected gynecologic malignancy requiring central venous access for chemotherapy. EXAM: IMPLANTED PORT A CATH PLACEMENT WITH ULTRASOUND AND FLUOROSCOPIC GUIDANCE COMPARISON:  None Available. MEDICATIONS: None. ANESTHESIA/SEDATION: Moderate (conscious) sedation was employed during this procedure. A total of Versed 1 mg and Fentanyl 50 mcg was administered intravenously. Moderate Sedation Time: 15 minutes. The patient's level of consciousness and vital signs were monitored continuously by radiology nursing throughout the procedure under my direct supervision. CONTRAST:  None FLUOROSCOPY TIME:  One mGy COMPLICATIONS: None immediate. PROCEDURE: The procedure, risks, benefits, and alternatives were explained to the patient. Questions regarding the procedure were encouraged and answered. The patient understands and consents to the procedure. The right neck and chest were prepped with chlorhexidine in a sterile fashion, and a sterile drape was applied covering the operative field. Maximum barrier sterile technique with sterile gowns and gloves were used for the procedure. A timeout was performed prior to the initiation of the procedure. Ultrasound was used to examine the jugular vein which was compressible and free of internal  echoes. A skin marker was used to demarcate the planned venotomy and port pocket incision sites. Local anesthesia was provided to these sites and the subcutaneous tunnel track with 1% lidocaine with 1:100,000 epinephrine. A small incision was created at the jugular access site and blunt dissection was performed of the subcutaneous  tissues. Under ultrasound guidance, the jugular vein was accessed with a 21 ga micropuncture needle and an 0.018" wire was inserted to the superior vena cava. Real-time ultrasound guidance was utilized for vascular access including the acquisition of a permanent ultrasound image documenting patency of the accessed vessel. A 5 Fr micopuncture set was then used, through which a 0.035" Rosen wire was passed under fluoroscopic guidance into the inferior vena cava. An 8 Fr dilator was then placed over the wire. A subcutaneous port pocket was then created along the upper chest wall utilizing a combination of sharp and blunt dissection. The pocket was irrigated with sterile saline, packed with gauze, and observed for hemorrhage. A single lumen plastic power injectable port was chosen for placement. The 8 Fr catheter was tunneled from the port pocket site to the venotomy incision. The port was placed in the pocket. The external catheter was trimmed to appropriate length. The dilator was exchanged for an 8 Fr peel-away sheath under fluoroscopic guidance. The catheter was then placed through the sheath and the sheath was removed. Final catheter positioning was confirmed and documented with a fluoroscopic spot radiograph. The port was accessed with a Huber needle, aspirated, and flushed with heparinized saline. The deep dermal layer of the port pocket incision was closed with interrupted 3-0 Vicryl suture. Dermabond was then placed over the port pocket and neck incisions. The patient tolerated the procedure well without immediate post procedural complication. FINDINGS: After catheter placement, the  tip lies within the superior cavoatrial junction. The catheter aspirates and flushes normally and is ready for immediate use. IMPRESSION: Successful placement of a power injectable Port-A-Cath via the right internal jugular vein. The catheter is ready for immediate use. Marliss Coots, MD [facility] Electronically Signed   By: Marliss Coots M.D.   On: 11/01/2023 13:22   MR ABDOMEN W WO CONTRAST  Result Date: 10/31/2023 CLINICAL DATA:  Pelvic mass.  Liver lesion EXAM: MRI ABDOMEN WITHOUT AND WITH CONTRAST TECHNIQUE: Multiplanar multisequence MR imaging of the abdomen was performed both before and after the administration of intravenous contrast. CONTRAST:  5mL GADAVIST GADOBUTROL 1 MMOL/ML IV SOLN COMPARISON:  CT 10/30/2023 and older. FINDINGS: Lower chest: Trace pleural fluid. Hepatobiliary: Numerous bright T2, low T1 nonenhancing foci identified consistent with benign cystic lesions. Many of these are under 15 mm. There are some larger foci identified such as segment 4 measuring 4.3 cm and caudate measuring 2.8 cm. Prior CT did demonstrate 1 lesion which is more complex anteriorly in the left hepatic lobe, segment 3 which on today's examination when taking into account motion show some progressive enhancement, is bright on T2 but not as bright as simple fluid and consistent with a small hemangioma. No specific aggressive liver lesion clearly identified today. Patent portal vein. Gallbladder is nondilated. No biliary ductal dilatation. Pancreas: Ectatic pancreatic duct diffusely measuring up to 6 mm, severe. No pancreatic focal atrophy, abnormal enhancement or abnormal T1 signal. No restricted diffusion along the pancreas. Spleen:  Within normal limits in size and appearance. Adrenals/Urinary Tract: Adrenal glands are preserved. No enhancing renal mass or collecting system dilatation. Stomach/Bowel: Visualized bowel is nondilated. This includes  visualized portions of the small and large bowel. The stomach is underdistended. Vascular/Lymphatic: Normal caliber aorta and IVC. Atherosclerotic changes along the aorta. Circumaortic left renal vein. Once again there is a abnormal lymph node identified anterior to the aorta in the upper abdomen on series 1602, image 58 measuring 2.3 x 1.8 cm.  Few other small retroperitoneal nodes identified. Other:  Trace ascites.  Mesenteric stranding. Musculoskeletal: Curvature and degenerative changes along the spine. IMPRESSION: Multiple benign-appearing liver lesions including cysts and 1 hemangioma. Persistent enlarged upper abdominal retroperitoneal lymph node. Additional smaller but prominent nodes as well. With the pelvic findings these are worrisome for potential spread of neoplasm. Mild ascites. Electronically Signed   By: Karen Kays M.D.   On: 10/31/2023 14:10   MR PELVIS W WO CONTRAST  Result Date: 10/31/2023 CLINICAL DATA:  Pelvic mass EXAM: MRI PELVIS WITHOUT AND WITH CONTRAST TECHNIQUE: Multiplanar multisequence MR imaging of the pelvis was performed both before and after administration of intravenous contrast. CONTRAST:  5mL GADAVIST GADOBUTROL 1 MMOL/ML IV SOLN COMPARISON:  CT 10/30/2019 FINDINGS: Urinary Tract: Bladder is mildly distended with fluid. There is some mass effect along the posterior aspect of the bladder related to the adjacent complex mass. The bladder itself appears grossly intact. No abnormal wall enhancement. Grossly preserved course of the urethra. Bowel: The visualized bowel in the pelvis is nondilated. However there is lobular masslike area along the distal sigmoid colon with the areas of heterogeneous enhancement. The dominant lesion in this location on series 4, image 29 measures 3.7 by 2.6 cm. There are several adjacent soft tissue nodules as well such as just posterior to the cervix and anterior to the bowel measuring 10 mm on series 22 image 30. Focus superior left lateral series  22, image 26 measures 2.3 x 1.7 cm. Additional foci elsewhere dependently in the pelvis including the presacral space. Vascular/Lymphatic: Atherosclerotic changes identified along the iliac vessels. No separate nodal enlargement identified. Reproductive: Uterus measures 8.2 by 2.0 by 3.4 cm. Endometrial stripe is less than 3 mm. Slightly heterogeneous myometrium. Anterior to the uterus is a large complex cystic and solid mass with heterogeneous enhancement of the solid component. Lesion measures 11.5 by 7.6 by 7.4 cm. The more solid component is right lateral inferior with a aggressive in enhancement measuring proximally 7.7 by 5.6 cm. Cephalocaudal length 8.5 cm. The cystic component more towards the left has a dimensions approaching 8.7 cm. There is surrounding free fluid and edema. Although this more in the central pelvis towards midline a right ovary is not seen as a separate structure. There is what appears to be a small postmenopausal of the left ovary measuring 15 mm. An ovarian neoplasm is a strong consideration. Other:  Small amount of free fluid in the pelvis.  Edema. Musculoskeletal: Curvature of the spine. Moderate degenerative changes of the lumbar spine with disc bulging and areas of stenosis greatest at L4-5. Degenerative changes of the pelvis and hips as well. Study is somewhat limited due to some artifacts postcontrast axial dataset as well as study being performed as a standard pelvis rather than a gynecologic pelvis exam. Please see separate dictation of abdomen MRI. IMPRESSION: Large complex cystic and solid pelvic mass centrally measuring up to 11.5 x 7.6 x 7.4 cm. Based on overall appearance this has worrisome for a neoplasm including an ovarian malignancy. Small amount of ascites in the pelvis. Soft tissue enhancing aggressive nodules along the course of the sigmoid colon as well as in the adjacent fat and presacral spaces. With the larger central mass this very well could be spread of disease  to adjacent structures rather than a primary colonic process but correlate with symptoms and if needed colonoscopy. Please see separate dictation of abdominal MRI. Electronically Signed   By: Karen Kays M.D.   On: 10/31/2023 14:01  CT CHEST W CONTRAST  Result Date: 10/31/2023 CLINICAL DATA:  77 year old female with suspected gynecologic malignancy. * Tracking Code: BO * EXAM: CT CHEST WITH CONTRAST TECHNIQUE: Multidetector CT imaging of the chest was performed during intravenous contrast administration. RADIATION DOSE REDUCTION: This exam was performed according to the departmental dose-optimization program which includes automated exposure control, adjustment of the mA and/or kV according to patient size and/or use of iterative reconstruction technique. CONTRAST:  75mL OMNIPAQUE IOHEXOL 350 MG/ML SOLN COMPARISON:  None Available. FINDINGS: Cardiovascular: The heart size is mildly enlarged. No pericardial effusion. Aortic atherosclerosis and coronary artery calcification. Mediastinum/Nodes: Trachea and esophagus appear unremarkable. The right lobe of thyroid gland appears surgically absent. No mediastinal or hilar adenopathy. Lungs/Pleura: No pleural effusion identified. Subsegmental atelectasis identified within the lingula and bilateral posterior lung bases. No signs of interstitial edema or airspace consolidation. No suspicious pulmonary nodule identified to suggest lung metastases. Upper Abdomen: No acute abnormality. Multiple liver cysts. Enlarged lymph node within the portal caval region measures 1.5 cm, image 155/3. Defer to report from CT [facility] dated 10/30/2019 for and same-day MRI of the abdomen pelvis for further details. Musculoskeletal: Mild curvature of the thoracic spine and lumbar spine is convex towards the left. Multilevel degenerative disc disease. No acute or suspicious osseous lesions. IMPRESSION: 1. No signs of metastatic disease to the chest. 2. Areas of subsegmental atelectasis noted  within bilateral posterior lung bases and lingula. 3. Enlarged upper abdominal lymph node as above. In the setting of a known malignancy this is concerning for nodal metastasis. 4. Coronary artery calcifications. 5.  Aortic Atherosclerosis (ICD10-I70.0). Electronically Signed   By: Signa Kell M.D.   On: 10/31/2023 05:57   CT ABDOMEN PELVIS W CONTRAST  Result Date: 10/30/2023 CLINICAL DATA:  Abdominal pain. EXAM: CT ABDOMEN AND PELVIS WITH CONTRAST TECHNIQUE: Multidetector CT imaging of the abdomen and pelvis was performed using the standard protocol following bolus administration of intravenous contrast. RADIATION DOSE REDUCTION: This exam was performed according to the departmental dose-optimization program which includes automated exposure control, adjustment of the mA and/or kV according to patient size and/or use of iterative reconstruction technique. CONTRAST:  75mL OMNIPAQUE IOHEXOL 350 MG/ML SOLN COMPARISON:  Limb 1424 FINDINGS: Lower chest: No acute findings. Hepatobiliary: Multiple hepatic cysts evident. Scattered tiny hypodensities in the liver parenchyma are too small to characterize but are statistically most likely benign. No followup imaging is recommended. Tiny subcapsular lesion measured previously at 9 mm in the anterior left liver is stable on image 23/3 today, nonspecific. There is no evidence for gallstones, gallbladder wall thickening, or pericholecystic fluid. No intrahepatic or extrahepatic biliary dilation. Pancreas: Dilatation of the pancreatic duct to the head and body of pancreas is similar to prior. Spleen: No splenomegaly. No suspicious focal mass lesion. Adrenals/Urinary Tract: No adrenal nodule or mass. Kidneys unremarkable. No evidence for hydroureter. Bladder is distended. Stomach/Bowel: Stomach is unremarkable. No gastric wall thickening. No evidence of outlet obstruction. Duodenum is normally positioned as is the ligament of Treitz. No small bowel wall thickening. No small  bowel dilatation. Diverticular changes are noted in the left colon without evidence of diverticulitis. Vascular/Lymphatic: 16 mm short axis portal caval lymph node seen on 22/3. No para-aortic lymphadenopathy. No pelvic sidewall lymphadenopathy. Reproductive: Multiple uterine fibroids evident. As noted on prior study there is an area of the anterior cervix that appears to obliterate the fat plane between the cervix and the posterior wall of the bladder (see sagittal 86/7). Small soft tissue nodules are again  noted in the cul-de-sac some of which may pertain to diverticuli, but others raise concern for peritoneal nodularity (see images 61 and 56 of series 3). Other: No substantial free fluid. Musculoskeletal: No worrisome lytic or sclerotic osseous abnormality. IMPRESSION: 1. Multiple uterine fibroids. As noted on prior study there is an area of the anterior cervix that appears to obliterate the fat plane between the cervix and the posterior wall of the bladder. This is concerning for a cervical mass. Gynecologic consultation recommended. 2. Small soft tissue nodules in the cul-de-sac some of which may relate to diverticuli, but others raise concern for peritoneal nodularity. Attention on follow-up recommended. PET-CT may prove helpful to further evaluate 3. 16 mm short axis portal caval lymph node, metastatic disease not excluded. 4. Left colonic diverticulosis without diverticulitis. Electronically Signed   By: Kennith Center M.D.   On: 10/30/2023 13:08   CT ABDOMEN PELVIS W CONTRAST  Result Date: 10/26/2023 CLINICAL DATA:  Pt w/ abnormal [place]; mass in pelvis; no h/o cancer; no pain; no urinary issues EXAM: CT ABDOMEN AND PELVIS WITH CONTRAST TECHNIQUE: Multidetector CT imaging of the abdomen and pelvis was performed using the standard protocol following bolus administration of intravenous contrast. RADIATION DOSE REDUCTION: This exam was performed according to the departmental dose-optimization program which  includes automated exposure control, adjustment of the mA and/or kV according to patient size and/or use of iterative reconstruction technique. CONTRAST:  ISOVUE-300 IOPAMIDOL (ISOVUE-300) INJECTION 61% COMPARISON:  Ultrasound pelvis 09/01/2023, ultrasound thyroid 10/10/2023 FINDINGS: Lower chest: Mitral annular calcification. Aortic valve leaflet calcification. No acute abnormality. Hepatobiliary: Multiple fluid density lesions scattered throughout the left right hepatic lobe. There an indeterminate 0.9 cm left hepatic lobe hypodensity with Hounsfield unit of 77 (2:29). No gallstones, gallbladder wall thickening, or pericholecystic fluid. No biliary dilatation. Pancreas: No focal lesion. Normal pancreatic contour. No surrounding inflammatory changes. No main pancreatic ductal dilatation. Spleen: Normal in size without focal abnormality. Adrenals/Urinary Tract: No adrenal nodule bilaterally. Bilateral kidneys enhance symmetrically. No hydronephrosis. No hydroureter. The urinary bladder is unremarkable. On delayed imaging, there is no urothelial wall thickening and there are no filling defects in the opacified portions of the bilateral collecting systems or ureters. Stomach/Bowel: Stomach is within normal limits. No evidence of small bowel wall thickening or dilatation. Increased stool burden proximal to the distal sigmoid colon mass with stool throughout the ascending, transverse, descending colon. Short segment of distal sigmoid colon irregular bowel wall thickening (5:26, 2:62). No large bowel luminal dilatation. Colonic diverticulosis appendix appears normal. Vascular/Lymphatic: No abdominal aorta or iliac aneurysm. 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 No abdominal, pelvic, or inguinal lymphadenopathy. Reproductive: There is a heterogeneous solid and cystic 11 x 9 cm mass arising from the uterine fundus. Finding is noted to  invade into the urinary bladder dome (6:81, 5:56 close) where there is loss of intraperitoneal and lower ring of the urinary bladder wall margin. The mass is noted to abut and appears to be inseparable from a short segment of distal sigmoid colon in the region of irregular bowel wall thickening. Other: No intraperitoneal free fluid. No intraperitoneal free gas. No organized fluid collection. Musculoskeletal: No abdominal wall hernia or abnormality. No suspicious lytic or blastic osseous lesions. No acute displaced fracture. Multilevel severe degenerative changes of the spine. Grade 1 anterolisthesis of L4 on L5 and L5 on S1. Mild retrolisthesis of L2 on L3 and L3 on L4. Dextroscoliosis centered at the  L3-L4 level. IMPRESSION: 1. An 11 x 9 cm heterogeneous solid and cystic mass arises from the uterine fundus and is noted to invade the urinary bladder dome wall as well as the distal sigmoid colon. No associated bowel obstruction; however, constipation proximal to irregular bowel wall thickening/mass. No associated stercoral colitis. Finding consistent with malignancy. Recommend gynecologic consultation. When the patient is clinically stable and able to follow directions and hold their breath (preferably as an outpatient) further evaluation with dedicated MRI with and without contrast should be considered. 2. Indeterminate 0.9 cm left hepatic lobe hypodense lesion with a density of 77 HU. Question metastasis versus primary hepatic lesion. 3. Stool throughout the colon 4. Colonic diverticulosis with no acute diverticulitis. 5. 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 6.  Aortic Atherosclerosis (ICD10-I70.0)-severe. These results will be called to the ordering clinician or representative by the Radiologist Assistant, and communication documented in the PACS or [employer]. Electronically Signed   By: Tish Frederickson M.D.   On: 10/26/2023 18:42   DG BONE DENSITY (DXA)  Result Date: 10/26/2023 EXAM: DUAL  X-RAY ABSORPTIOMETRY (DXA) FOR BONE MINERAL DENSITY IMPRESSION: Referring Physician:  Henrine Screws Your patient completed a bone mineral density test using GE Lunar iDXA system (analysis version: 16). Technologist: BEC PATIENT: Name: Lori Conrad, Lori Conrad Patient ID: [id number] Birth Date: [birth year] Height: 60.5 in. Sex: Female Measured: 10/26/2023 Weight: 110.2 lbs. Indications: Advanced Age, Caucasian, Estrogen Deficient, Height Loss (781.91), History of Osteoporosis, Levothyroxine, Postmenopausal Fractures: Left Ankle Treatments: Calcium (E943.0), Vitamin D (E933.5) ASSESSMENT: The BMD measured at Forearm Radius 33% is 0.624 g/cm2 with a T-score of -2.9. This patient's diagnostic category is OSTEOPOROSIS according to World Health Organization [facility]) criteria. Comparison to 10/16/2019. Since the prior study, there has been a SIGNIFICANT DECREASE in bone mineral density of the hips (-4.6%). The lumbar spine was excluded due to being excluded from prior exam. The quality of the exam is good. Site Region Measured Date Measured Age YA BMD Significant CHANGE T-score Right Forearm Radius 33% 10/26/2023 78.0 -2.9 0.624 g/cm2 Right Forearm Radius 33% 10/16/2019 74.0 -2.6 0.653 g/cm2 DualFemur Neck Left 10/26/2023 78.0 -0.6 0.952 g/cm2 DualFemur Neck Left 10/16/2019 74.0 -0.5 0.962 g/cm2 DualFemur Total Mean 10/26/2023 78.0 -0.7 0.918 g/cm2 * DualFemur Total Mean 10/16/2019 74.0 -0.4 0.962 g/cm2 World Health Organization [facility]) criteria for post-menopausal, Caucasian Women: Normal       T-score at or above -1 SD Osteopenia   T-score between -1 and -2.5 SD Osteoporosis T-score at or below -2.5 SD RECOMMENDATION: 1. All patients should optimize calcium and vitamin D intake. 2. Consider FDA-approved medical therapies in postmenopausal women and men aged 47 years and older, based on the following: a. A hip or vertebral (clinical or morphometric) fracture. b. T-score = -2.5 at the femoral neck or spine after appropriate evaluation  to exclude secondary causes. c. Low bone mass (T-score between -1.0 and -2.5 at the femoral neck or spine) and a 10-year probability of a hip fracture = 3% or a 10-year probability of a major osteoporosis-related fracture = 20% based on the [place]-adapted WHO algorithm. d. Clinician judgment and/or patient preferences may indicate treatment for people with 10-year fracture probabilities above or below these levels. FOLLOW-UP: Patients with diagnosis of osteoporosis or at high risk for fracture should have regular bone mineral density tests.? Patients eligible for Medicare are allowed routine testing every 2 years.? The testing frequency can be increased to one year for patients who have rapidly progressing disease, are receiving or discontinuing  medical therapy to restore bone mass, or have additional risk factors. I have reviewed this study and agree with the findings. [facility] Electronically Signed   By: Harmon Pier M.D.   On: 10/26/2023 12:30    ASSESSMENT & PLAN:  Malignant ovarian neoplasm, right (HCC) I have reviewed blood work including tumor marker monitoring as well as CT imaging with the patient and her son We discussed pathology report Overall, presentation is most consistent with right ovarian cancer We discussed neoadjuvant approach to treating her disease  We reviewed the NCCN guidelines We discussed the role of chemotherapy. The intent is of curative intent.  We discussed some of the risks, benefits, side-effects of carboplatin & Taxol. Treatment is intravenous, every 3 weeks x 6 cycles  Some of the short term side-effects included, though not limited to, including weight loss, life threatening infections, risk of allergic reactions, need for transfusions of blood products, nausea, vomiting, change in bowel habits, loss of hair, admission to hospital for various reasons, and risks of death.   Long term side-effects are also discussed including risks of infertility,  permanent damage to nerve function, hearing loss, chronic fatigue, kidney damage with possibility needing hemodialysis, and rare secondary malignancy including bone marrow disorders.  The patient is aware that the response rates discussed earlier is not guaranteed.  After a long discussion, patient made an informed decision to proceed with the prescribed plan of care.   Patient education material was dispensed. We discussed premedication with dexamethasone before chemotherapy. She had port placed I will schedule chemo education class this week The patient desire hair preservation and we will use Dignicap I am hopeful we can get her started with chemotherapy next week I will see her prior to cycle 2 of treatment I plan to repeat imaging study after cycle 3 of therapy for objective assessment of response to therapy We will monitor CA125 monthly  Other constipation We discussed importance of regular laxatives  Orders Placed This Encounter  Procedures   CBC with Differential (Cancer Center Only)    Standing Status:   Future    Standing Expiration Date:   11/21/2024   CMP (Cancer Center only)    Standing Status:   Future    Standing Expiration Date:   11/21/2024   CBC with Differential (Cancer Center Only)    Standing Status:   Future    Standing Expiration Date:   12/14/2024   CMP (Cancer Center only)    Standing Status:   Future    Standing Expiration Date:   12/14/2024   CBC with Differential (Cancer Center Only)    Standing Status:   Future    Standing Expiration Date:   01/04/2025   CMP (Cancer Center only)    Standing Status:   Future    Standing Expiration Date:   01/04/2025   CBC with Differential (Cancer Center Only)    Standing Status:   Future    Standing Expiration Date:   01/25/2025   CMP (Cancer Center only)    Standing Status:   Future    Standing Expiration Date:   01/25/2025   CBC with Differential (Cancer Center Only)    Standing Status:   Future    Standing  Expiration Date:   02/15/2025   CMP (Cancer Center only)    Standing Status:   Future    Standing Expiration Date:   02/15/2025   CBC with Differential (Cancer Center Only)    Standing Status:   Future  Standing Expiration Date:   03/08/2025   CMP (Cancer Center only)    Standing Status:   Future    Standing Expiration Date:   03/08/2025   CA 125    Standing Status:   Standing    Number of Occurrences:   11    Standing Expiration Date:   11/13/2024    All questions were answered. The patient knows to call the clinic with any problems, questions or concerns. The total time spent in the appointment was 80 minutes encounter with patients including review of chart and various tests results, discussions about plan of care and coordination of care plan   Artis Delay, MD 11/14/2023 4:10 PM

## 2023-11-14 NOTE — Progress Notes (Signed)
START ON PATHWAY REGIMEN - Ovarian     A cycle is every 21 days:     Paclitaxel      Carboplatin   **Always confirm dose/schedule in your pharmacy ordering system**  Patient Characteristics: Preoperative or Nonsurgical Candidate (Clinical Staging), Newly Diagnosed, Neoadjuvant Therapy followed by Surgery BRCA Mutation Status: Awaiting Test Results Therapeutic Status: Preoperative or Nonsurgical Candidate (Clinical Staging) AJCC T Category: cT3 AJCC 8 Stage Grouping: Unknown AJCC N Category: cN1 AJCC M Category: cM0 Therapy Plan: Neoadjuvant Therapy followed by Surgery Intent of Therapy: Curative Intent, Discussed with Patient

## 2023-11-14 NOTE — Assessment & Plan Note (Signed)
I have reviewed blood work including tumor marker monitoring as well as CT imaging with the patient and her son We discussed pathology report Overall, presentation is most consistent with right ovarian cancer We discussed neoadjuvant approach to treating her disease  We reviewed the NCCN guidelines We discussed the role of chemotherapy. The intent is of curative intent.  We discussed some of the risks, benefits, side-effects of carboplatin & Taxol. Treatment is intravenous, every 3 weeks x 6 cycles  Some of the short term side-effects included, though not limited to, including weight loss, life threatening infections, risk of allergic reactions, need for transfusions of blood products, nausea, vomiting, change in bowel habits, loss of hair, admission to hospital for various reasons, and risks of death.   Long term side-effects are also discussed including risks of infertility, permanent damage to nerve function, hearing loss, chronic fatigue, kidney damage with possibility needing hemodialysis, and rare secondary malignancy including bone marrow disorders.  The patient is aware that the response rates discussed earlier is not guaranteed.  After a long discussion, patient made an informed decision to proceed with the prescribed plan of care.   Patient education material was dispensed. We discussed premedication with dexamethasone before chemotherapy. She had port placed I will schedule chemo education class this week The patient desire hair preservation and we will use Dignicap I am hopeful we can get her started with chemotherapy next week I will see her prior to cycle 2 of treatment I plan to repeat imaging study after cycle 3 of therapy for objective assessment of response to therapy We will monitor CA125 monthly

## 2023-11-14 NOTE — Assessment & Plan Note (Signed)
We discussed importance of regular laxatives 

## 2023-11-14 NOTE — Telephone Encounter (Signed)
Per the request of Dr.Newton, Lori Conrad' appointment has been moved from 12/16 to 12/9 @ 4:00. Pt agreed to date/time

## 2023-11-15 ENCOUNTER — Other Ambulatory Visit: Payer: Self-pay

## 2023-11-15 DIAGNOSIS — H353221 Exudative age-related macular degeneration, left eye, with active choroidal neovascularization: Secondary | ICD-10-CM | POA: Diagnosis not present

## 2023-11-15 LAB — CYTOLOGY - NON PAP

## 2023-11-15 LAB — SURGICAL PATHOLOGY

## 2023-11-15 NOTE — Progress Notes (Signed)
Pharmacist Chemotherapy Monitoring - Initial Assessment    Anticipated start date: 11/22/23   The following has been reviewed per standard work regarding the patient's treatment regimen: The patient's diagnosis, treatment plan and drug doses, and organ/hematologic function Lab orders and baseline tests specific to treatment regimen  The treatment plan start date, drug sequencing, and pre-medications Prior authorization status  Patient's documented medication list, including drug-drug interaction screen and prescriptions for anti-emetics and supportive care specific to the treatment regimen The drug concentrations, fluid compatibility, administration routes, and timing of the medications to be used The patient's access for treatment and lifetime cumulative dose history, if applicable  The patient's medication allergies and previous infusion related reactions, if applicable   Changes made to treatment plan:  N/A  Follow up needed:  Pending authorization for treatment    Janeice Robinson, RPH, 11/15/2023  11:34 AM

## 2023-11-17 ENCOUNTER — Inpatient Hospital Stay: Payer: Medicare HMO

## 2023-11-17 DIAGNOSIS — K5909 Other constipation: Secondary | ICD-10-CM | POA: Diagnosis not present

## 2023-11-17 DIAGNOSIS — C772 Secondary and unspecified malignant neoplasm of intra-abdominal lymph nodes: Secondary | ICD-10-CM | POA: Diagnosis not present

## 2023-11-17 DIAGNOSIS — C786 Secondary malignant neoplasm of retroperitoneum and peritoneum: Secondary | ICD-10-CM | POA: Diagnosis not present

## 2023-11-17 DIAGNOSIS — R1031 Right lower quadrant pain: Secondary | ICD-10-CM | POA: Diagnosis not present

## 2023-11-17 DIAGNOSIS — C561 Malignant neoplasm of right ovary: Secondary | ICD-10-CM | POA: Diagnosis not present

## 2023-11-17 DIAGNOSIS — Z5111 Encounter for antineoplastic chemotherapy: Secondary | ICD-10-CM | POA: Diagnosis not present

## 2023-11-17 LAB — CMP (CANCER CENTER ONLY)
ALT: 13 U/L (ref 0–44)
AST: 20 U/L (ref 15–41)
Albumin: 3.7 g/dL (ref 3.5–5.0)
Alkaline Phosphatase: 62 U/L (ref 38–126)
Anion gap: 5 (ref 5–15)
BUN: 13 mg/dL (ref 8–23)
CO2: 29 mmol/L (ref 22–32)
Calcium: 9 mg/dL (ref 8.9–10.3)
Chloride: 106 mmol/L (ref 98–111)
Creatinine: 0.55 mg/dL (ref 0.44–1.00)
GFR, Estimated: >60 mL/min (ref >60)
Glucose, Bld: 92 mg/dL (ref 70–99)
Potassium: 4.1 mmol/L (ref 3.5–5.1)
Sodium: 140 mmol/L (ref 135–145)
Total Bilirubin: 0.5 mg/dL (ref <1.2)
Total Protein: 6.1 g/dL — ABNORMAL LOW (ref 6.5–8.1)

## 2023-11-17 LAB — CBC WITH DIFFERENTIAL (CANCER CENTER ONLY)
Abs Immature Granulocytes: 0.06 10*3/uL (ref 0.00–0.07)
Basophils Absolute: 0.1 10*3/uL (ref 0.0–0.1)
Basophils Relative: 1 %
Eosinophils Absolute: 0.1 10*3/uL (ref 0.0–0.5)
Eosinophils Relative: 3 %
HCT: 43.4 % (ref 36.0–46.0)
Hemoglobin: 14.4 g/dL (ref 12.0–15.0)
Immature Granulocytes: 1 %
Lymphocytes Relative: 24 %
Lymphs Abs: 1.2 10*3/uL (ref 0.7–4.0)
MCH: 30.1 pg (ref 26.0–34.0)
MCHC: 33.2 g/dL (ref 30.0–36.0)
MCV: 90.8 fL (ref 80.0–100.0)
Monocytes Absolute: 0.6 10*3/uL (ref 0.1–1.0)
Monocytes Relative: 13 %
Neutro Abs: 2.9 10*3/uL (ref 1.7–7.7)
Neutrophils Relative %: 58 %
Platelet Count: 188 10*3/uL (ref 150–400)
RBC: 4.78 MIL/uL (ref 3.87–5.11)
RDW: 13.5 % (ref 11.5–15.5)
WBC Count: 5 10*3/uL (ref 4.0–10.5)
nRBC: 0 % (ref 0.0–0.2)

## 2023-11-17 MED ORDER — HEPARIN SOD (PORK) LOCK FLUSH 100 UNIT/ML IV SOLN
500.0000 [IU] | Freq: Once | INTRAVENOUS | Status: AC
  Administered 2023-11-17: 500 [IU]

## 2023-11-17 MED ORDER — SODIUM CHLORIDE 0.9% FLUSH
10.0000 mL | Freq: Once | INTRAVENOUS | Status: AC
  Administered 2023-11-17: 10 mL

## 2023-11-18 LAB — CA 125: Cancer Antigen (CA) 125: 169 U/mL — ABNORMAL HIGH (ref 0.0–38.1)

## 2023-11-20 ENCOUNTER — Encounter: Payer: Self-pay | Admitting: Oncology

## 2023-11-20 ENCOUNTER — Inpatient Hospital Stay (HOSPITAL_BASED_OUTPATIENT_CLINIC_OR_DEPARTMENT_OTHER): Payer: Medicare HMO | Admitting: Psychiatry

## 2023-11-20 ENCOUNTER — Other Ambulatory Visit: Payer: Self-pay | Admitting: Oncology

## 2023-11-20 ENCOUNTER — Other Ambulatory Visit: Payer: Self-pay

## 2023-11-20 VITALS — BP 133/86 | HR 86 | Temp 98.4°F | Resp 16 | Ht 61.0 in | Wt 109.0 lb

## 2023-11-20 DIAGNOSIS — C772 Secondary and unspecified malignant neoplasm of intra-abdominal lymph nodes: Secondary | ICD-10-CM

## 2023-11-20 DIAGNOSIS — Z5111 Encounter for antineoplastic chemotherapy: Secondary | ICD-10-CM | POA: Diagnosis not present

## 2023-11-20 DIAGNOSIS — C786 Secondary malignant neoplasm of retroperitoneum and peritoneum: Secondary | ICD-10-CM

## 2023-11-20 DIAGNOSIS — Z9889 Other specified postprocedural states: Secondary | ICD-10-CM

## 2023-11-20 DIAGNOSIS — K5909 Other constipation: Secondary | ICD-10-CM | POA: Diagnosis not present

## 2023-11-20 DIAGNOSIS — C561 Malignant neoplasm of right ovary: Secondary | ICD-10-CM | POA: Diagnosis not present

## 2023-11-20 DIAGNOSIS — Z7189 Other specified counseling: Secondary | ICD-10-CM

## 2023-11-20 DIAGNOSIS — R1031 Right lower quadrant pain: Secondary | ICD-10-CM | POA: Diagnosis not present

## 2023-11-20 NOTE — Progress Notes (Signed)
Gynecologic Oncology Multi-Disciplinary Disposition Conference Note  Date of the Conference: 11/20/2023  Patient Name: Lori Conrad  Primary GYN Oncologist: Dr. 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 Disposition is to neoadjuvant chemotherapy followed by consideration for interval debulking surgery. Also referral to genetic counseling.   This Multidisciplinary conference took place involving physicians from Gynecologic Oncology, Medical Oncology, Radiation Oncology, Pathology, Radiology along with the Gynecologic Oncology Nurse Practitioner and Gynecologic Oncology Nurse Navigator.  Comprehensive assessment of the patient's malignancy, staging, need for surgery, chemotherapy, radiation therapy, and need for further testing were reviewed. Supportive measures, both inpatient and following discharge were also discussed. The recommended plan of care is documented. Greater than 35 minutes were spent correlating and coordinating this patient's care.

## 2023-11-20 NOTE — Progress Notes (Signed)
Faxed order requisition to [facility] for [employer] testing on accession [phone].

## 2023-11-20 NOTE — Progress Notes (Signed)
Gynecologic Oncology Return Clinic Visit  Date of Service: 11/20/2023 Referring Provider: Henrine Screws, MD [address] [place],  [place] [address]  Assessment & Plan: Lori Conrad is a 77 y.o. 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 Eugene Garnet.  Postop: - Pt recovering well from surgery and healing appropriately postoperatively - Ongoing postoperative expectations and precautions reviewed.   Ovarian cancer: - Intraoperative findings and pathology results reviewed. - Intra-op findings suggestive of ovarian torsion, may explain prior pelvic pain - Given multiple firm nodules in the posterior cul-de-sac involving the sigmoid colon and rectum with almost complete obliteration of the posterior cul-de-sac, as well as enlarged para-aortic lymph node that would be challenging to resect due to the location high in the abdomen, would recommend treatment with NACT followed by imaging and consideration of interval debulking surgery. - Chemo reviewed in brief.  - Would plan for 3 cycles, repeat imaging and interval debulk after 3-4 cycles pending response on imaging. - Interval would likely involve bowel surgery - Recommend referral to genetics and tumor NGS including evaluation for HRD. Orders placed.  - Referral to Dr. Bertis Ruddy for chemo.   RTC for interval debulking discussion.  Clide Cliff, MD Gynecologic Oncology   Medical Decision Making I personally spent  TOTAL 35 minutes face-to-face and non-face-to-face in the care of this patient, which includes all pre, intra, and post visit time on the date of service. The discussion of diagnosis and treatment of ovarian cancer is beyond the scope of routine postoperative care.   ----------------------- Reason for Visit: Postop/Treatment Counseling  Treatment History: Oncology History  Malignant  ovarian neoplasm, right (HCC)  10/26/2023 Imaging   IR IMAGING GUIDED PORT INSERTION  Result Date: 11/01/2023 INDICATION: 77 year old female with suspected gynecologic malignancy requiring central venous access for chemotherapy. EXAM: IMPLANTED PORT A CATH PLACEMENT WITH ULTRASOUND AND FLUOROSCOPIC GUIDANCE COMPARISON:  None Available. MEDICATIONS: None. ANESTHESIA/SEDATION: Moderate (conscious) sedation was employed during this procedure. A total of Versed 1 mg and Fentanyl 50 mcg was administered intravenously. Moderate Sedation Time: 15 minutes. The patient's level of consciousness and vital signs were monitored continuously by radiology nursing throughout the procedure under my direct supervision. CONTRAST:  None FLUOROSCOPY TIME:  One mGy COMPLICATIONS: None immediate. PROCEDURE: The procedure, risks, benefits, and alternatives were explained to the patient. Questions regarding the procedure were encouraged and answered. The patient understands and consents to the procedure. The right neck and chest were prepped with chlorhexidine in a sterile fashion, and a sterile drape was applied covering the operative field. Maximum barrier sterile technique with sterile gowns and gloves were used for the procedure. A timeout was performed prior to the initiation of the procedure. Ultrasound was used to examine the jugular vein which was compressible and free of internal echoes. A skin marker was used to demarcate the planned venotomy and port pocket incision sites. Local anesthesia was provided to these sites and the subcutaneous tunnel track with 1% lidocaine with 1:100,000 epinephrine. A small incision was created at the jugular access site and blunt dissection was performed of the subcutaneous tissues. Under ultrasound guidance, the jugular vein was accessed with a 21 ga micropuncture needle and an 0.018" wire was inserted to the superior vena cava. Real-time ultrasound guidance was utilized for vascular access  including the acquisition of a permanent ultrasound image documenting patency of the accessed vessel. A 5 Fr micopuncture  set was then used, through which a 0.035" Rosen wire was passed under fluoroscopic guidance into the inferior vena cava. An 8 Fr dilator was then placed over the wire. A subcutaneous port pocket was then created along the upper chest wall utilizing a combination of sharp and blunt dissection. The pocket was irrigated with sterile saline, packed with gauze, and observed for hemorrhage. A single lumen plastic power injectable port was chosen for placement. The 8 Fr catheter was tunneled from the port pocket site to the venotomy incision. The port was placed in the pocket. The external catheter was trimmed to appropriate length. The dilator was exchanged for an 8 Fr peel-away sheath under fluoroscopic guidance. The catheter was then placed through the sheath and the sheath was removed. Final catheter positioning was confirmed and documented with a fluoroscopic spot radiograph. The port was accessed with a Huber needle, aspirated, and flushed with heparinized saline. The deep dermal layer of the port pocket incision was closed with interrupted 3-0 Vicryl suture. Dermabond was then placed over the port pocket and neck incisions. The patient tolerated the procedure well without immediate post procedural complication. FINDINGS: After catheter placement, the tip lies within the superior cavoatrial junction. The catheter aspirates and flushes normally and is ready for immediate use. IMPRESSION: Successful placement of a power injectable Port-A-Cath via the right internal jugular vein. The catheter is ready for immediate use. Marliss Coots, MD [facility] Electronically Signed   By: Marliss Coots M.D.   On: 11/01/2023 13:22   MR ABDOMEN W WO CONTRAST  Result Date: 10/31/2023 CLINICAL DATA:  Pelvic mass.  Liver lesion EXAM: MRI ABDOMEN WITHOUT  AND WITH CONTRAST TECHNIQUE: Multiplanar multisequence MR imaging of the abdomen was performed both before and after the administration of intravenous contrast. CONTRAST:  5mL GADAVIST GADOBUTROL 1 MMOL/ML IV SOLN COMPARISON:  CT 10/30/2023 and older. FINDINGS: Lower chest: Trace pleural fluid. Hepatobiliary: Numerous bright T2, low T1 nonenhancing foci identified consistent with benign cystic lesions. Many of these are under 15 mm. There are some larger foci identified such as segment 4 measuring 4.3 cm and caudate measuring 2.8 cm. Prior CT did demonstrate 1 lesion which is more complex anteriorly in the left hepatic lobe, segment 3 which on today's examination when taking into account motion show some progressive enhancement, is bright on T2 but not as bright as simple fluid and consistent with a small hemangioma. No specific aggressive liver lesion clearly identified today. Patent portal vein. Gallbladder is nondilated. No biliary ductal dilatation. Pancreas: Ectatic pancreatic duct diffusely measuring up to 6 mm, severe. No pancreatic focal atrophy, abnormal enhancement or abnormal T1 signal. No restricted diffusion along the pancreas. Spleen:  Within normal limits in size and appearance. Adrenals/Urinary Tract: Adrenal glands are preserved. No enhancing renal mass or collecting system dilatation. Stomach/Bowel: Visualized bowel is nondilated. This includes visualized portions of the small and large bowel. The stomach is underdistended. Vascular/Lymphatic: Normal caliber aorta and IVC. Atherosclerotic changes along the aorta. Circumaortic left renal vein. Once again there is a abnormal lymph node identified anterior to the aorta in the upper abdomen on series 1602, image 58 measuring 2.3 x 1.8 cm. Few other small retroperitoneal nodes identified. Other:  Trace ascites.  Mesenteric stranding. Musculoskeletal: Curvature and degenerative changes along the spine. IMPRESSION: Multiple benign-appearing liver lesions  including cysts and 1 hemangioma. Persistent enlarged upper abdominal retroperitoneal lymph node. Additional smaller but prominent nodes as well. With the pelvic findings these are worrisome  for potential spread of neoplasm. Mild ascites. Electronically Signed   By: Karen Kays M.D.   On: 10/31/2023 14:10   MR PELVIS W WO CONTRAST  Result Date: 10/31/2023 CLINICAL DATA:  Pelvic mass EXAM: MRI PELVIS WITHOUT AND WITH CONTRAST TECHNIQUE: Multiplanar multisequence MR imaging of the pelvis was performed both before and after administration of intravenous contrast. CONTRAST:  5mL GADAVIST GADOBUTROL 1 MMOL/ML IV SOLN COMPARISON:  CT 10/30/2019 FINDINGS: Urinary Tract: Bladder is mildly distended with fluid. There is some mass effect along the posterior aspect of the bladder related to the adjacent complex mass. The bladder itself appears grossly intact. No abnormal wall enhancement. Grossly preserved course of the urethra. Bowel: The visualized bowel in the pelvis is nondilated. However there is lobular masslike area along the distal sigmoid colon with the areas of heterogeneous enhancement. The dominant lesion in this location on series 4, image 29 measures 3.7 by 2.6 cm. There are several adjacent soft tissue nodules as well such as just posterior to the cervix and anterior to the bowel measuring 10 mm on series 22 image 30. Focus superior left lateral series 22, image 26 measures 2.3 x 1.7 cm. Additional foci elsewhere dependently in the pelvis including the presacral space. Vascular/Lymphatic: Atherosclerotic changes identified along the iliac vessels. No separate nodal enlargement identified. Reproductive: Uterus measures 8.2 by 2.0 by 3.4 cm. Endometrial stripe is less than 3 mm. Slightly heterogeneous myometrium. Anterior to the uterus is a large complex cystic and solid mass with heterogeneous enhancement of the solid component. Lesion measures 11.5 by 7.6 by 7.4 cm. The more solid component is right  lateral inferior with a aggressive in enhancement measuring proximally 7.7 by 5.6 cm. Cephalocaudal length 8.5 cm. The cystic component more towards the left has a dimensions approaching 8.7 cm. There is surrounding free fluid and edema. Although this more in the central pelvis towards midline a right ovary is not seen as a separate structure. There is what appears to be a small postmenopausal of the left ovary measuring 15 mm. An ovarian neoplasm is a strong consideration. Other:  Small amount of free fluid in the pelvis.  Edema. Musculoskeletal: Curvature of the spine. Moderate degenerative changes of the lumbar spine with disc bulging and areas of stenosis greatest at L4-5. Degenerative changes of the pelvis and hips as well. Study is somewhat limited due to some artifacts postcontrast axial dataset as well as study being performed as a standard pelvis rather than a gynecologic pelvis exam. Please see separate dictation of abdomen MRI. IMPRESSION: Large complex cystic and solid pelvic mass centrally measuring up to 11.5 x 7.6 x 7.4 cm. Based on overall appearance this has worrisome for a neoplasm including an ovarian malignancy. Small amount of ascites in the pelvis. Soft tissue enhancing aggressive nodules along the course of the sigmoid colon as well as in the adjacent fat and presacral spaces. With the larger central mass this very well could be spread of disease to adjacent structures rather than a primary colonic process but correlate with symptoms and if needed colonoscopy. Please see separate dictation of abdominal MRI. Electronically Signed   By: Karen Kays M.D.   On: 10/31/2023 14:01   CT CHEST W CONTRAST  Result Date: 10/31/2023 CLINICAL DATA:  77 year old female with suspected gynecologic malignancy. * Tracking Code: BO * EXAM: CT CHEST WITH CONTRAST TECHNIQUE: Multidetector CT imaging of the chest was performed during intravenous contrast administration. RADIATION DOSE REDUCTION: This exam was  performed according to the  departmental dose-optimization program which includes automated exposure control, adjustment of the mA and/or kV according to patient size and/or use of iterative reconstruction technique. CONTRAST:  75mL OMNIPAQUE IOHEXOL 350 MG/ML SOLN COMPARISON:  None Available. FINDINGS: Cardiovascular: The heart size is mildly enlarged. No pericardial effusion. Aortic atherosclerosis and coronary artery calcification. Mediastinum/Nodes: Trachea and esophagus appear unremarkable. The right lobe of thyroid gland appears surgically absent. No mediastinal or hilar adenopathy. Lungs/Pleura: No pleural effusion identified. Subsegmental atelectasis identified within the lingula and bilateral posterior lung bases. No signs of interstitial edema or airspace consolidation. No suspicious pulmonary nodule identified to suggest lung metastases. Upper Abdomen: No acute abnormality. Multiple liver cysts. Enlarged lymph node within the portal caval region measures 1.5 cm, image 155/3. Defer to report from CT [facility] dated 10/30/2019 for and same-day MRI of the abdomen pelvis for further details. Musculoskeletal: Mild curvature of the thoracic spine and lumbar spine is convex towards the left. Multilevel degenerative disc disease. No acute or suspicious osseous lesions. IMPRESSION: 1. No signs of metastatic disease to the chest. 2. Areas of subsegmental atelectasis noted within bilateral posterior lung bases and lingula. 3. Enlarged upper abdominal lymph node as above. In the setting of a known malignancy this is concerning for nodal metastasis. 4. Coronary artery calcifications. 5.  Aortic Atherosclerosis (ICD10-I70.0). Electronically Signed   By: Signa Kell M.D.   On: 10/31/2023 05:57   CT ABDOMEN PELVIS W CONTRAST  Result Date: 10/30/2023 CLINICAL DATA:  Abdominal pain. EXAM: CT ABDOMEN AND PELVIS WITH CONTRAST TECHNIQUE: Multidetector CT imaging of the abdomen and pelvis was performed using the standard  protocol following bolus administration of intravenous contrast. RADIATION DOSE REDUCTION: This exam was performed according to the departmental dose-optimization program which includes automated exposure control, adjustment of the mA and/or kV according to patient size and/or use of iterative reconstruction technique. CONTRAST:  75mL OMNIPAQUE IOHEXOL 350 MG/ML SOLN COMPARISON:  Limb 1424 FINDINGS: Lower chest: No acute findings. Hepatobiliary: Multiple hepatic cysts evident. Scattered tiny hypodensities in the liver parenchyma are too small to characterize but are statistically most likely benign. No followup imaging is recommended. Tiny subcapsular lesion measured previously at 9 mm in the anterior left liver is stable on image 23/3 today, nonspecific. There is no evidence for gallstones, gallbladder wall thickening, or pericholecystic fluid. No intrahepatic or extrahepatic biliary dilation. Pancreas: Dilatation of the pancreatic duct to the head and body of pancreas is similar to prior. Spleen: No splenomegaly. No suspicious focal mass lesion. Adrenals/Urinary Tract: No adrenal nodule or mass. Kidneys unremarkable. No evidence for hydroureter. Bladder is distended. Stomach/Bowel: Stomach is unremarkable. No gastric wall thickening. No evidence of outlet obstruction. Duodenum is normally positioned as is the ligament of Treitz. No small bowel wall thickening. No small bowel dilatation. Diverticular changes are noted in the left colon without evidence of diverticulitis. Vascular/Lymphatic: 16 mm short axis portal caval lymph node seen on 22/3. No para-aortic lymphadenopathy. No pelvic sidewall lymphadenopathy. Reproductive: Multiple uterine fibroids evident. As noted on prior study there is an area of the anterior cervix that appears to obliterate the fat plane between the cervix and the posterior wall of the bladder (see sagittal 86/7). Small soft tissue nodules are again noted in the cul-de-sac some of which may  pertain to diverticuli, but others raise concern for peritoneal nodularity (see images 61 and 56 of series 3). Other: No substantial free fluid. Musculoskeletal: No worrisome lytic or sclerotic osseous abnormality. IMPRESSION: 1. Multiple uterine fibroids. As noted on prior study there  is an area of the anterior cervix that appears to obliterate the fat plane between the cervix and the posterior wall of the bladder. This is concerning for a cervical mass. Gynecologic consultation recommended. 2. Small soft tissue nodules in the cul-de-sac some of which may relate to diverticuli, but others raise concern for peritoneal nodularity. Attention on follow-up recommended. PET-CT may prove helpful to further evaluate 3. 16 mm short axis portal caval lymph node, metastatic disease not excluded. 4. Left colonic diverticulosis without diverticulitis. Electronically Signed   By: Kennith Center M.D.   On: 10/30/2023 13:08   CT ABDOMEN PELVIS W CONTRAST  Result Date: 10/26/2023 CLINICAL DATA:  Pt w/ abnormal [place]; mass in pelvis; no h/o cancer; no pain; no urinary issues EXAM: CT ABDOMEN AND PELVIS WITH CONTRAST TECHNIQUE: Multidetector CT imaging of the abdomen and pelvis was performed using the standard protocol following bolus administration of intravenous contrast. RADIATION DOSE REDUCTION: This exam was performed according to the departmental dose-optimization program which includes automated exposure control, adjustment of the mA and/or kV according to patient size and/or use of iterative reconstruction technique. CONTRAST:  ISOVUE-300 IOPAMIDOL (ISOVUE-300) INJECTION 61% COMPARISON:  Ultrasound pelvis 09/01/2023, ultrasound thyroid 10/10/2023 FINDINGS: Lower chest: Mitral annular calcification. Aortic valve leaflet calcification. No acute abnormality. Hepatobiliary: Multiple fluid density lesions scattered throughout the left right hepatic lobe. There an indeterminate 0.9 cm left hepatic lobe hypodensity with  Hounsfield unit of 77 (2:29). No gallstones, gallbladder wall thickening, or pericholecystic fluid. No biliary dilatation. Pancreas: No focal lesion. Normal pancreatic contour. No surrounding inflammatory changes. No main pancreatic ductal dilatation. Spleen: Normal in size without focal abnormality. Adrenals/Urinary Tract: No adrenal nodule bilaterally. Bilateral kidneys enhance symmetrically. No hydronephrosis. No hydroureter. The urinary bladder is unremarkable. On delayed imaging, there is no urothelial wall thickening and there are no filling defects in the opacified portions of the bilateral collecting systems or ureters. Stomach/Bowel: Stomach is within normal limits. No evidence of small bowel wall thickening or dilatation. Increased stool burden proximal to the distal sigmoid colon mass with stool throughout the ascending, transverse, descending colon. Short segment of distal sigmoid colon irregular bowel wall thickening (5:26, 2:62). No large bowel luminal dilatation. Colonic diverticulosis appendix appears normal. Vascular/Lymphatic: No abdominal aorta or iliac aneurysm. 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 No abdominal, pelvic, or inguinal lymphadenopathy. Reproductive: There is a heterogeneous solid and cystic 11 x 9 cm mass arising from the uterine fundus. Finding is noted to invade into the urinary bladder dome (6:81, 5:56 close) where there is loss of intraperitoneal and lower ring of the urinary bladder wall margin. The mass is noted to abut and appears to be inseparable from a short segment of distal sigmoid colon in the region of irregular bowel wall thickening. Other: No intraperitoneal free fluid. No intraperitoneal free gas. No organized fluid collection. Musculoskeletal: No abdominal wall hernia or abnormality. No suspicious lytic or blastic osseous lesions. No acute displaced fracture. Multilevel severe  degenerative changes of the spine. Grade 1 anterolisthesis of L4 on L5 and L5 on S1. Mild retrolisthesis of L2 on L3 and L3 on L4. Dextroscoliosis centered at the L3-L4 level. IMPRESSION: 1. An 11 x 9 cm heterogeneous solid and cystic mass arises from the uterine fundus and is noted to invade the urinary bladder dome wall as well as the distal sigmoid colon. No associated bowel obstruction; however, constipation proximal to irregular bowel wall thickening/mass. No associated  stercoral colitis. Finding consistent with malignancy. Recommend gynecologic consultation. When the patient is clinically stable and able to follow directions and hold their breath (preferably as an outpatient) further evaluation with dedicated MRI with and without contrast should be considered. 2. Indeterminate 0.9 cm left hepatic lobe hypodense lesion with a density of 77 HU. Question metastasis versus primary hepatic lesion. 3. Stool throughout the colon 4. Colonic diverticulosis with no acute diverticulitis. 5. 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 6.  Aortic Atherosclerosis (ICD10-I70.0)-severe. These results will be called to the ordering clinician or representative by the Radiologist Assistant, and communication documented in the PACS or [employer]. Electronically Signed   By: Tish Frederickson M.D.   On: 10/26/2023 18:42   DG BONE DENSITY (DXA)  Result Date: 10/26/2023 EXAM: DUAL X-RAY ABSORPTIOMETRY (DXA) FOR BONE MINERAL DENSITY IMPRESSION: Referring Physician:  Henrine Screws Your patient completed a bone mineral density test using GE Lunar iDXA system (analysis version: 16). Technologist: BEC PATIENT: Name: Kaysi, Ourada Patient ID: [id number] Birth Date: 1946/06/30 Height: 60.5 in. Sex: Female Measured: 10/26/2023 Weight: 110.2 lbs. Indications: Advanced Age, Caucasian, Estrogen Deficient, Height Loss (781.91), History of Osteoporosis, Levothyroxine, Postmenopausal Fractures: Left Ankle Treatments: Calcium  (E943.0), Vitamin D (E933.5) ASSESSMENT: The BMD measured at Forearm Radius 33% is 0.624 g/cm2 with a T-score of -2.9. This patient's diagnostic category is OSTEOPOROSIS according to World Health Organization [facility]) criteria. Comparison to 10/16/2019. Since the prior study, there has been a SIGNIFICANT DECREASE in bone mineral density of the hips (-4.6%). The lumbar spine was excluded due to being excluded from prior exam. The quality of the exam is good. Site Region Measured Date Measured Age YA BMD Significant CHANGE T-score Right Forearm Radius 33% 10/26/2023 78.0 -2.9 0.624 g/cm2 Right Forearm Radius 33% 10/16/2019 74.0 -2.6 0.653 g/cm2 DualFemur Neck Left 10/26/2023 78.0 -0.6 0.952 g/cm2 DualFemur Neck Left 10/16/2019 74.0 -0.5 0.962 g/cm2 DualFemur Total Mean 10/26/2023 78.0 -0.7 0.918 g/cm2 * DualFemur Total Mean 10/16/2019 74.0 -0.4 0.962 g/cm2 World Health Organization [facility]) criteria for post-menopausal, Caucasian Women: Normal       T-score at or above -1 SD Osteopenia   T-score between -1 and -2.5 SD Osteoporosis T-score at or below -2.5 SD RECOMMENDATION: 1. All patients should optimize calcium and vitamin D intake. 2. Consider FDA-approved medical therapies in postmenopausal women and men aged 91 years and older, based on the following: a. A hip or vertebral (clinical or morphometric) fracture. b. T-score = -2.5 at the femoral neck or spine after appropriate evaluation to exclude secondary causes. c. Low bone mass (T-score between -1.0 and -2.5 at the femoral neck or spine) and a 10-year probability of a hip fracture = 3% or a 10-year probability of a major osteoporosis-related fracture = 20% based on the [place]-adapted WHO algorithm. d. Clinician judgment and/or patient preferences may indicate treatment for people with 10-year fracture probabilities above or below these levels. FOLLOW-UP: Patients with diagnosis of osteoporosis or at high risk for fracture should have regular bone mineral density tests.?  Patients eligible for Medicare are allowed routine testing every 2 years.? The testing frequency can be increased to one year for patients who have rapidly progressing disease, are receiving or discontinuing medical therapy to restore bone mass, or have additional risk factors. I have reviewed this study and agree with the findings. [facility] Electronically Signed   By: Harmon Pier M.D.   On: 10/26/2023 12:30      11/08/2023 Pathology Results   SURGICAL PATHOLOGY  CASE: [phone] PATIENT: [facility] [facility] Surgical Pathology Report  Clinical History: Pelvic mass, suspected malignancy (crm)   FINAL MICROSCOPIC DIAGNOSIS:  A. PELVIC SIDEWALL NODULE, RIGHT, EXCISION: - High-grade serous carcinoma, see comment  B. ABDOMINAL WALL #1, ANTERIOR, EXCISION: - High-grade serous carcinoma  COMMENT:  A.  Immunohistochemical stain show that the tumor cells are positive for CK7, PAX8, and p16 (diffuse overexpression).  Immunostain for p53 shows a clonal null expression pattern.  Immunostains for CK20 is negative. This immunoprofile is consistent with the above interpretation and suggestive of an ovarian primary.    11/14/2023 Initial Diagnosis   Malignant ovarian neoplasm, right (HCC)   11/14/2023 Cancer Staging   Staging form: Ovary, Fallopian Tube, and Primary Peritoneal Carcinoma, AJCC 8th Edition - Clinical stage from 11/14/2023: FIGO Stage IIIC (cT3c, cN1, cM0) - Signed by Artis Delay, MD on 11/14/2023 Stage prefix: Initial diagnosis   11/20/2023 Tumor Marker   Patient's tumor was tested for the following markers: CA-125. Results of the tumor marker test revealed 169.   11/22/2023 -  Chemotherapy   Patient is on Treatment Plan : OVARIAN Carboplatin (AUC 6) + Paclitaxel (175) q21d X 6 Cycles       Interval History: Pt reports that she is recovering well from surgery. She is not needing anything for pain. She is eating and drinking well. She is voiding without issue and  having regular bowel movements.   Presents today with her son's father-in-law.   Past Medical/Surgical History: Past Medical History:  Diagnosis Date   Arthritis    Dilated aortic root (HCC)    Hyperlipidemia    Hypothyroidism    Macular degeneration     Past Surgical History:  Procedure Laterality Date   ANKLE FRACTURE SURGERY Left    BROW LIFT Bilateral 05/17/2022   Procedure: BILATERAL UPPER BLEPHAROPLASTY WITH PTOSIS REPAIR;  Surgeon: Janne Napoleon, MD;  Location: [facility] OR;  Service: Plastics;  Laterality: Bilateral;  1.5 hours   BUNIONECTOMY Left    COLONOSCOPY     EYE SURGERY     FLEXIBLE SIGMOIDOSCOPY N/A 11/02/2023   Procedure: FLEXIBLE SIGMOIDOSCOPY;  Surgeon: Kerin Salen, MD;  Location: [facility] ENDOSCOPY;  Service: Gastroenterology;  Laterality: N/A;   IR IMAGING GUIDED PORT INSERTION  11/01/2023   LAPAROSCOPY N/A 11/08/2023   Procedure: LAPAROSCOPY DIAGNOSTIC WITH BIOPSY;  Surgeon: Carver Fila, MD;  Location: [facility] ORS;  Service: Gynecology;  Laterality: N/A;   REFRACTIVE SURGERY     laser for macular degeneration   SHOULDER ARTHROSCOPY     TONSILLECTOMY      Family History  Problem Relation Age of Onset   Lung cancer Mother    Stroke Mother 38   Alzheimer's disease Father    CAD Father        95% occlusion of L Main & RCA, severe descending aorta, arterial & arteriolonephrosclerosis    Cholecystitis Father    Esophagitis Father    Pancreatic cancer Daughter    BRCA 1/2 Neg Hx    Breast cancer Neg Hx    Colon cancer Neg Hx    Ovarian cancer Neg Hx    Endometrial cancer Neg Hx    Prostate cancer Neg Hx     Social History   Socioeconomic History   Marital status: Widowed    Spouse name: Not on file   Number of children: 2   Years of education: Not on file   Highest education level: Not on file  Occupational History   Not on file  Tobacco Use   Smoking status: Former    Current packs/day: 0.00    Types: Cigarettes    Quit date: 05/15/1988     Years since quitting: 35.5   Smokeless tobacco: Never  Vaping Use   Vaping status: Never Used  Substance and Sexual Activity   Alcohol use: Yes    Alcohol/week: 14.0 standard drinks of alcohol    Types: 14 drink(s) per week    Comment: white wine   Drug use: No   Sexual activity: Not Currently    Partners: Male  Other Topics Concern   Not on file  Social History Narrative   Not on file   Social Determinants of Health   Financial Resource Strain: Not on file  Food Insecurity: No Food Insecurity (10/30/2023)   Hunger Vital Sign    Worried About Running Out of Food in the Last Year: Never true    Ran Out of Food in the Last Year: Never true  Transportation Needs: No Transportation Needs (10/30/2023)   PRAPARE - Administrator, Civil Service (Medical): No    Lack of Transportation (Non-Medical): No  Physical Activity: Not on file  Stress: Not on file  Social Connections: Not on file    Current Medications:  Current Outpatient Medications:    atorvastatin (LIPITOR) 40 MG tablet, Take 40 mg by mouth at bedtime., Disp: , Rfl:    Calcium Carb-Cholecalciferol (SUPER CALCIUM 600 + D 400 PO), Take 1 tablet by mouth daily., Disp: , Rfl:    Cholecalciferol (VITAMIN D) 50 MCG (2000 UT) tablet, Take 2,000 Units by mouth daily., Disp: , Rfl:    Coenzyme Q10 (COQ10) 400 MG CAPS, Take 400 mg by mouth daily., Disp: , Rfl:    dexamethasone (DECADRON) 4 MG tablet, Take 2 tabs at the night before and 2 tab the morning of chemotherapy, every 3 weeks, by mouth x 6 cycles, Disp: 24 tablet, Rfl: 6   doxylamine, Sleep, (UNISOM) 25 MG tablet, Take 25 mg by mouth at bedtime as needed for sleep., Disp: , Rfl:    Glucosamine-Chondroit-Vit C-Mn (GLUCOSAMINE 1500 COMPLEX PO), Take 1 tablet by mouth daily., Disp: , Rfl:    HYDROcodone-acetaminophen (NORCO/VICODIN) 5-325 MG tablet, Take 1-2 tablets by mouth every 4 (four) hours as needed for moderate pain (pain score 4-6) or severe pain (pain  score 7-10)., Disp: 30 tablet, Rfl: 0   levothyroxine (SYNTHROID) 75 MCG tablet, Take 75 mcg by mouth daily before breakfast., Disp: , Rfl:    lidocaine-prilocaine (EMLA) cream, Apply to affected area once, Disp: 30 g, Rfl: 3   Magnesium 250 MG TABS, Take 250 mg by mouth daily., Disp: , Rfl:    meloxicam (MOBIC) 7.5 MG tablet, Take 7.5 mg by mouth daily., Disp: , Rfl:    Multiple Vitamins-Minerals (PRESERVISION AREDS 2+MULTI VIT PO), Take 1 tablet by mouth 2 (two) times daily., Disp: , Rfl:    Omega-3 Fatty Acids (OMEGA 3 500) 500 MG CAPS, Take 500 mg by mouth daily., Disp: , Rfl:    ondansetron (ZOFRAN) 4 MG tablet, Take 1 tablet (4 mg total) by mouth every 8 (eight) hours as needed for nausea or vomiting., Disp: 30 tablet, Rfl: 0   ondansetron (ZOFRAN) 8 MG tablet, Take 1 tablet (8 mg total) by mouth every 8 (eight) hours as needed for nausea or vomiting. Start on the third day after carboplatin., Disp: 30 tablet, Rfl: 1   polyethylene glycol (MIRALAX / GLYCOLAX) 17 g packet, Take 17 g  by mouth daily as needed for mild constipation., Disp: 14 each, Rfl: 0   prochlorperazine (COMPAZINE) 10 MG tablet, Take 1 tablet (10 mg total) by mouth every 6 (six) hours as needed for nausea or vomiting., Disp: 30 tablet, Rfl: 1   senna-docusate (SENOKOT-S) 8.6-50 MG tablet, Take 1 tablet by mouth 2 (two) times daily., Disp: 180 tablet, Rfl: 0  Review of Symptoms: Complete 10-system review is negative except as above in Interval History.  Physical Exam: BP 133/86 (BP Location: Left Arm, Patient Position: Sitting)   Pulse 86   Temp 98.4 F (36.9 C) (Oral)   Resp 16   Ht 5\' 1"  (1.549 m)   Wt 109 lb (49.4 kg)   SpO2 99%   BMI 20.60 kg/m  General: Alert, oriented, no acute distress. HEENT: Normocephalic, atraumatic. Neck symmetric without masses. Sclera anicteric.  Chest: Normal work of breathing. Clear to auscultation bilaterally.   Cardiovascular: Regular rate and rhythm, no murmurs. Abdomen: Soft,  nontender.  Normoactive bowel sounds.  No masses appreciated.  Well-healing incisions. Extremities: Grossly normal range of motion.  Warm, well perfused.  No edema bilaterally. Skin: No rashes or lesions noted.   Laboratory & Radiologic Studies: Surgical pathology (11/08/23): A. PELVIC SIDEWALL NODULE, RIGHT, EXCISION:  - High-grade serous carcinoma, see comment   B. ABDOMINAL WALL #1, ANTERIOR, EXCISION:  - High-grade serous carcinoma   COMMENT:  A. Immunohistochemical stain show that the tumor cells are positive for  CK7, PAX8, and p16 (diffuse overexpression).  Immunostain for p53 shows  a clonal null expression pattern.  Immunostains for CK20 is negative.  This immunoprofile is consistent with the above interpretation and  suggestive of an ovarian primary.   Cytology (11/08/23): FINAL MICROSCOPIC DIAGNOSIS:  - Adenocarcinoma  - See comment   SPECIMEN ADEQUACY:  Satisfactory for evaluation   DIAGNOSTIC COMMENTS:  Immunohistochemical stain show that the tumor cells are positive for  PAX8, WT1 and p16 with clonal loss of expression pattern for p53.  This  immunoprofile is consistent with high-grade serous carcinoma, of likely  ovarian origin.

## 2023-11-20 NOTE — Patient Instructions (Signed)
It was a pleasure to see you in clinic today. -Talked about that you will receive chemotherapy once every 3 weeks.  You will receive 3 treatments and then will undergo repeat imaging to see how you responding to treatment.  If this is going well, you might have surgery 4 weeks after her last chemotherapy, sometimes after a fourth cycle of chemotherapy.  If we need more response to treatment you may get more chemotherapy prior to surgery, possibly 6 treatments of chemotherapy total prior to surgery. -Dr. Bertis Ruddy will have you return to me after interval imaging to decide if you are ready for surgery. -Let Clydie Braun know what medication it is you have at home that you felt was helpful for anxiety.  Thank you very much for allowing me to provide care for you today.  I appreciate your confidence in choosing our Gynecologic Oncology team at [facility].  If you have any questions about your visit today please call our office or send [place] a MyChart message and we will get back to you as soon as possible.

## 2023-11-21 MED FILL — Fosaprepitant Dimeglumine For IV Infusion 150 MG (Base Eq): INTRAVENOUS | Qty: 5 | Status: AC

## 2023-11-22 ENCOUNTER — Other Ambulatory Visit: Payer: Self-pay

## 2023-11-22 ENCOUNTER — Inpatient Hospital Stay: Payer: Medicare HMO

## 2023-11-22 VITALS — BP 157/84 | HR 78 | Temp 97.9°F | Resp 18 | Wt 111.2 lb

## 2023-11-22 DIAGNOSIS — C561 Malignant neoplasm of right ovary: Secondary | ICD-10-CM

## 2023-11-22 DIAGNOSIS — K5909 Other constipation: Secondary | ICD-10-CM | POA: Diagnosis not present

## 2023-11-22 DIAGNOSIS — C772 Secondary and unspecified malignant neoplasm of intra-abdominal lymph nodes: Secondary | ICD-10-CM | POA: Diagnosis not present

## 2023-11-22 DIAGNOSIS — Z5111 Encounter for antineoplastic chemotherapy: Secondary | ICD-10-CM | POA: Diagnosis not present

## 2023-11-22 DIAGNOSIS — R1031 Right lower quadrant pain: Secondary | ICD-10-CM | POA: Diagnosis not present

## 2023-11-22 DIAGNOSIS — C786 Secondary malignant neoplasm of retroperitoneum and peritoneum: Secondary | ICD-10-CM | POA: Diagnosis not present

## 2023-11-22 MED ORDER — SODIUM CHLORIDE 0.9 % IV SOLN
175.0000 mg/m2 | Freq: Once | INTRAVENOUS | Status: AC
  Administered 2023-11-22: 258 mg via INTRAVENOUS
  Filled 2023-11-22: qty 43

## 2023-11-22 MED ORDER — SODIUM CHLORIDE 0.9% FLUSH
10.0000 mL | INTRAVENOUS | Status: DC | PRN
Start: 2023-11-22 — End: 2023-11-22
  Administered 2023-11-22: 10 mL

## 2023-11-22 MED ORDER — CARBOPLATIN CHEMO INJECTION 450 MG/45ML
366.0000 mg | Freq: Once | INTRAVENOUS | Status: AC
  Administered 2023-11-22: 370 mg via INTRAVENOUS
  Filled 2023-11-22: qty 37

## 2023-11-22 MED ORDER — DEXAMETHASONE SODIUM PHOSPHATE 10 MG/ML IJ SOLN
10.0000 mg | Freq: Once | INTRAMUSCULAR | Status: AC
  Administered 2023-11-22: 10 mg via INTRAVENOUS
  Filled 2023-11-22: qty 1

## 2023-11-22 MED ORDER — HEPARIN SOD (PORK) LOCK FLUSH 100 UNIT/ML IV SOLN
500.0000 [IU] | Freq: Once | INTRAVENOUS | Status: AC | PRN
Start: 2023-11-22 — End: 2023-11-22
  Administered 2023-11-22: 500 [IU]

## 2023-11-22 MED ORDER — SODIUM CHLORIDE 0.9 % IV SOLN
INTRAVENOUS | Status: DC
Start: 2023-11-22 — End: 2023-11-22

## 2023-11-22 MED ORDER — CETIRIZINE HCL 10 MG/ML IV SOLN
10.0000 mg | Freq: Once | INTRAVENOUS | Status: AC
  Administered 2023-11-22: 10 mg via INTRAVENOUS
  Filled 2023-11-22: qty 1

## 2023-11-22 MED ORDER — PALONOSETRON HCL INJECTION 0.25 MG/5ML
0.2500 mg | Freq: Once | INTRAVENOUS | Status: AC
  Administered 2023-11-22: 0.25 mg via INTRAVENOUS
  Filled 2023-11-22: qty 5

## 2023-11-22 MED ORDER — FAMOTIDINE IN NACL 20-0.9 MG/50ML-% IV SOLN
20.0000 mg | Freq: Once | INTRAVENOUS | Status: AC
  Administered 2023-11-22: 20 mg via INTRAVENOUS
  Filled 2023-11-22: qty 50

## 2023-11-22 MED ORDER — FOSAPREPITANT DIMEGLUMINE INJECTION 150 MG
150.0000 mg | Freq: Once | INTRAVENOUS | Status: AC
  Administered 2023-11-22: 150 mg via INTRAVENOUS
  Filled 2023-11-22: qty 150

## 2023-11-22 NOTE — Patient Instructions (Signed)
[facility] - A DEPT OF [facility]  Discharge Instructions: Thank you for choosing [facility] to provide your oncology and hematology care.   If you have a lab appointment with the Cancer Center, please go directly to the Cancer Center and check in at the registration area.   Wear comfortable clothing and clothing appropriate for easy access to any Portacath or PICC line.   We strive to give you quality time with your provider. You may need to reschedule your appointment if you arrive late (15 or more minutes).  Arriving late affects you and other patients whose appointments are after yours.  Also, if you miss three or more appointments without notifying the office, you may be dismissed from the clinic at the provider's discretion.      For prescription refill requests, have your pharmacy contact our office and allow 72 hours for refills to be completed.    Today you received the following chemotherapy and/or immunotherapy agents Paclitaxel/Carboplatin      To help prevent nausea and vomiting after your treatment, we encourage you to take your nausea medication as directed.  BELOW ARE SYMPTOMS THAT SHOULD BE REPORTED IMMEDIATELY: *FEVER GREATER THAN 100.4 F (38 C) OR HIGHER *CHILLS OR SWEATING *NAUSEA AND VOMITING THAT IS NOT CONTROLLED WITH YOUR NAUSEA MEDICATION *UNUSUAL SHORTNESS OF BREATH *UNUSUAL BRUISING OR BLEEDING *URINARY PROBLEMS (pain or burning when urinating, or frequent urination) *BOWEL PROBLEMS (unusual diarrhea, constipation, pain near the anus) TENDERNESS IN MOUTH AND THROAT WITH OR WITHOUT PRESENCE OF ULCERS (sore throat, sores in mouth, or a toothache) UNUSUAL RASH, SWELLING OR PAIN  UNUSUAL VAGINAL DISCHARGE OR ITCHING   Items with * indicate a potential emergency and should be followed up as soon as possible or go to the Emergency Department if any problems should occur.  Please show the CHEMOTHERAPY ALERT CARD or  IMMUNOTHERAPY ALERT CARD at check-in to the Emergency Department and triage nurse.  Should you have questions after your visit or need to cancel or reschedule your appointment, please contact [facility] - A DE[facility]  Dept: [phone]  and follow the prompts.  Office hours are 8:00 a.m. to 4:30 p.m. Monday - Friday. Please note that voicemails left after 4:00 p.m. may not be returned until the following business day.  We are closed weekends and major holidays. You have access to a nurse at all times for urgent questions. Please call the main number to the clinic Dept: [phone] and follow the prompts.   For any non-urgent questions, you may also contact your provider using MyChart. We now offer e-Visits for anyone 51 and older to request care online for non-urgent symptoms. For details visit [email or web address].   Also download the MyChart app! Go to the app store, search "MyChart", open the app, select [place], and log in with your MyChart username and password.  Paclitaxel Injection What is this medication? PACLITAXEL (PAK li TAX el) treats some types of cancer. It works by slowing down the growth of cancer cells. This medicine may be used for other purposes; ask your health care provider or pharmacist if you have questions. COMMON BRAND NAME(S): Onxol, Taxol What should I tell my care team before I take this medication? They need to know if you have any of these conditions: Heart disease Liver disease Low white blood cell levels An unusual or allergic reaction to paclitaxel, other medications, foods,  dyes, or preservatives If you or your partner are pregnant or trying to get pregnant Breast-feeding How should I use this medication? This medication is injected into a vein. It is given by your care team in a hospital or clinic setting. Talk to your care team about the use of this medication in children. While it may be given to children  for selected conditions, precautions do apply. Overdosage: If you think you have taken too much of this medicine contact a poison control center or emergency room at once. NOTE: This medicine is only for you. Do not share this medicine with others. What if I miss a dose? Keep appointments for follow-up doses. It is important not to miss your dose. Call your care team if you are unable to keep an appointment. What may interact with this medication? Do not take this medication with any of the following: Live virus vaccines Other medications may affect the way this medication works. Talk with your care team about all of the medications you take. They may suggest changes to your treatment plan to lower the risk of side effects and to make sure your medications work as intended. This list may not describe all possible interactions. Give your health care provider a list of all the medicines, herbs, non-prescription drugs, or dietary supplements you use. Also tell them if you smoke, drink alcohol, or use illegal drugs. Some items may interact with your medicine. What should I watch for while using this medication? Your condition will be monitored carefully while you are receiving this medication. You may need blood work while taking this medication. This medication may make you feel generally unwell. This is not uncommon as chemotherapy can affect healthy cells as well as cancer cells. Report any side effects. Continue your course of treatment even though you feel ill unless your care team tells you to stop. This medication can cause serious allergic reactions. To reduce the risk, your care team may give you other medications to take before receiving this one. Be sure to follow the directions from your care team. This medication may increase your risk of getting an infection. Call your care team for advice if you get a fever, chills, sore throat, or other symptoms of a cold or flu. Do not treat yourself. Try  to avoid being around people who are sick. This medication may increase your risk to bruise or bleed. Call your care team if you notice any unusual bleeding. Be careful brushing or flossing your teeth or using a toothpick because you may get an infection or bleed more easily. If you have any dental work done, tell your dentist you are receiving this medication. Talk to your care team if you may be pregnant. Serious birth defects can occur if you take this medication during pregnancy. Talk to your care team before breastfeeding. Changes to your treatment plan may be needed. What side effects may I notice from receiving this medication? Side effects that you should report to your care team as soon as possible: Allergic reactions--skin rash, itching, hives, swelling of the face, lips, tongue, or throat Heart rhythm changes--fast or irregular heartbeat, dizziness, feeling faint or lightheaded, chest pain, trouble breathing Increase in blood pressure Infection--fever, chills, cough, sore throat, wounds that don't heal, pain or trouble when passing urine, general feeling of discomfort or being unwell Low blood pressure--dizziness, feeling faint or lightheaded, blurry vision Low red blood cell level--unusual weakness or fatigue, dizziness, headache, trouble breathing Painful swelling, warmth, or redness of  the skin, blisters or sores at the infusion site Pain, tingling, or numbness in the hands or feet Slow heartbeat--dizziness, feeling faint or lightheaded, confusion, trouble breathing, unusual weakness or fatigue Unusual bruising or bleeding Side effects that usually do not require medical attention (report to your care team if they continue or are bothersome): Diarrhea Hair loss Joint pain Loss of appetite Muscle pain Nausea Vomiting This list may not describe all possible side effects. Call your doctor for medical advice about side effects. You may report side effects to FDA at  1-800-FDA-1088. Where should I keep my medication? This medication is given in a hospital or clinic. It will not be stored at home. NOTE: This sheet is a summary. It may not cover all possible information. If you have questions about this medicine, talk to your doctor, pharmacist, or health care provider.  2024 Elsevier/Gold Standard (2022-04-19 00:00:00)  Carboplatin Injection What is this medication? CARBOPLATIN (KAR boe pla tin) treats some types of cancer. It works by slowing down the growth of cancer cells. This medicine may be used for other purposes; ask your health care provider or pharmacist if you have questions. COMMON BRAND NAME(S): Paraplatin What should I tell my care team before I take this medication? They need to know if you have any of these conditions: Blood disorders Hearing problems Kidney disease Recent or ongoing radiation therapy An unusual or allergic reaction to carboplatin, cisplatin, other medications, foods, dyes, or preservatives Pregnant or trying to get pregnant Breast-feeding How should I use this medication? This medication is injected into a vein. It is given by your care team in a hospital or clinic setting. Talk to your care team about the use of this medication in children. Special care may be needed. Overdosage: If you think you have taken too much of this medicine contact a poison control center or emergency room at once. NOTE: This medicine is only for you. Do not share this medicine with others. What if I miss a dose? Keep appointments for follow-up doses. It is important not to miss your dose. Call your care team if you are unable to keep an appointment. What may interact with this medication? Medications for seizures Some antibiotics, such as amikacin, gentamicin, neomycin, streptomycin, tobramycin Vaccines This list may not describe all possible interactions. Give your health care provider a list of all the medicines, herbs,  non-prescription drugs, or dietary supplements you use. Also tell them if you smoke, drink alcohol, or use illegal drugs. Some items may interact with your medicine. What should I watch for while using this medication? Your condition will be monitored carefully while you are receiving this medication. You may need blood work while taking this medication. This medication may make you feel generally unwell. This is not uncommon, as chemotherapy can affect healthy cells as well as cancer cells. Report any side effects. Continue your course of treatment even though you feel ill unless your care team tells you to stop. In some cases, you may be given additional medications to help with side effects. Follow all directions for their use. This medication may increase your risk of getting an infection. Call your care team for advice if you get a fever, chills, sore throat, or other symptoms of a cold or flu. Do not treat yourself. Try to avoid being around people who are sick. Avoid taking medications that contain aspirin, acetaminophen, ibuprofen, naproxen, or ketoprofen unless instructed by your care team. These medications may hide a fever. Be careful brushing  or flossing your teeth or using a toothpick because you may get an infection or bleed more easily. If you have any dental work done, tell your dentist you are receiving this medication. Talk to your care team if you wish to become pregnant or think you might be pregnant. This medication can cause serious birth defects. Talk to your care team about effective forms of contraception. Do not breast-feed while taking this medication. What side effects may I notice from receiving this medication? Side effects that you should report to your care team as soon as possible: Allergic reactions--skin rash, itching, hives, swelling of the face, lips, tongue, or throat Infection--fever, chills, cough, sore throat, wounds that don't heal, pain or trouble when passing  urine, general feeling of discomfort or being unwell Low red blood cell level--unusual weakness or fatigue, dizziness, headache, trouble breathing Pain, tingling, or numbness in the hands or feet, muscle weakness, change in vision, confusion or trouble speaking, loss of balance or coordination, trouble walking, seizures Unusual bruising or bleeding Side effects that usually do not require medical attention (report to your care team if they continue or are bothersome): Hair loss Nausea Unusual weakness or fatigue Vomiting This list may not describe all possible side effects. Call your doctor for medical advice about side effects. You may report side effects to FDA at 1-800-FDA-1088. Where should I keep my medication? This medication is given in a hospital or clinic. It will not be stored at home. NOTE: This sheet is a summary. It may not cover all possible information. If you have questions about this medicine, talk to your doctor, pharmacist, or health care provider.  2024 Elsevier/Gold Standard (2022-03-22 00:00:00)

## 2023-11-22 NOTE — Progress Notes (Signed)
Pt using Digni-Cap.  Finished cooling & machine off & defrosting before d/c.

## 2023-11-23 ENCOUNTER — Telehealth: Payer: Self-pay

## 2023-11-23 NOTE — Telephone Encounter (Signed)
-----   Message from Nurse Julieanne Manson sent at 11/22/2023  3:13 PM EST ----- Regarding: Lori Conrad 1st Tx F/U call - Taxol/Carbo Gorsuch 1st Tx F/U Call - Taxol/Carbo.  Patient verbalizes understanding.

## 2023-11-23 NOTE — Telephone Encounter (Signed)
Ms Sise states that she is doing fine. She is eating, drinking, and urinating well. She knows to call the office at [phone] if  she has any questions or concerns.

## 2023-11-27 ENCOUNTER — Encounter: Payer: Self-pay | Admitting: Psychiatry

## 2023-11-27 ENCOUNTER — Encounter: Payer: Medicare HMO | Admitting: Psychiatry

## 2023-11-27 ENCOUNTER — Telehealth: Payer: Self-pay

## 2023-11-27 ENCOUNTER — Encounter: Payer: Self-pay | Admitting: Hematology and Oncology

## 2023-11-27 ENCOUNTER — Other Ambulatory Visit: Payer: Self-pay | Admitting: Hematology and Oncology

## 2023-11-27 MED ORDER — HYDROCODONE-ACETAMINOPHEN 5-325 MG PO TABS: 1.0000 | ORAL_TABLET | ORAL | 0 refills | Status: DC | PRN

## 2023-11-27 NOTE — Telephone Encounter (Signed)
Called and given below message to son. He will pick up Rx and give his Mom the message that the office will call tomorrow.

## 2023-11-27 NOTE — Telephone Encounter (Signed)
Received a call from son to call his Mom she is having a lot of abdominal pain. Attempted to call Cordelia Pen, no answer and voicemail full. Called son back and he will call his Mom and get her to call the office.

## 2023-11-27 NOTE — Telephone Encounter (Signed)
Returned her call. She is asking if Dr. Bertis Ruddy will prescribe a sleeping pill, she is having trouble sleeping. Instructed to try Melatonin or tylenol pm OTC and call the office back if needed. She verbalized understanding.

## 2023-11-27 NOTE — Telephone Encounter (Signed)
She called back. She is complaining of constipation. No bm since Friday. She is taking Miralax and benefiber currently. Instructed to stop benefiber and start taking Miralax BID and senokot-s 2 tabs TID. She verbalized understanding and will start laxatives today.  She is complaining of lack of appetite. Instructed eat 5-6 small meals a day until appetite returns and push oral fluids. She is asking if Dr. Bertis Ruddy can send Norco to [facility] on [address]. She has a left over Rx from the hospital and has 5 tabs left. She has been taking the Norco for abdominal pain.

## 2023-11-27 NOTE — Telephone Encounter (Signed)
Please call her again tomorrow after lunch to see if constipation resolved I refilled her Norco

## 2023-11-28 ENCOUNTER — Other Ambulatory Visit: Payer: Self-pay

## 2023-11-28 ENCOUNTER — Telehealth: Payer: Self-pay

## 2023-11-28 MED ORDER — LACTULOSE 10 GM/15ML PO SOLN: 7.0000 g | Freq: Three times a day (TID) | ORAL | 0 refills | Status: DC

## 2023-11-28 NOTE — Telephone Encounter (Signed)
Called to follow up and see if she has had a bm. No bm since Friday. She is starting to have abdominal pain and taking pain medication. She is taking as instructed miralax BID and senokot-s 2 tabs TID. She is passing gas and her abdomen is rumbling.  Dr. Bertis Ruddy given above message and will send Lactulose Rx to her pharmacy to start today. She will continue all the laxatives and will call the office back Thursday am with update.  Instructed Sherry to call the office back with questions/concerns. She verbalized understanding.

## 2023-11-29 ENCOUNTER — Ambulatory Visit (HOSPITAL_COMMUNITY): Payer: Medicare HMO | Attending: Internal Medicine

## 2023-11-29 ENCOUNTER — Other Ambulatory Visit: Payer: Medicare HMO

## 2023-11-29 ENCOUNTER — Telehealth: Payer: Self-pay | Admitting: *Deleted

## 2023-11-29 DIAGNOSIS — I517 Cardiomegaly: Secondary | ICD-10-CM

## 2023-11-29 DIAGNOSIS — I77810 Thoracic aortic ectasia: Secondary | ICD-10-CM

## 2023-11-29 DIAGNOSIS — I7121 Aneurysm of the ascending aorta, without rupture: Secondary | ICD-10-CM | POA: Diagnosis not present

## 2023-11-29 DIAGNOSIS — I5030 Unspecified diastolic (congestive) heart failure: Secondary | ICD-10-CM | POA: Diagnosis not present

## 2023-11-29 LAB — ECHOCARDIOGRAM COMPLETE
Area-P 1/2: 4.39 cm2
S' Lateral: 2.1 cm

## 2023-11-29 NOTE — Telephone Encounter (Signed)
Pt called and stated that she had a bm after 1 dose of the px lactulose and feels relieved. Gave pt recommendations for helping combat constipation. Advised to increase fluids, take stool softeners and laxatives as directed. Pt verbalized understanding.

## 2023-11-30 ENCOUNTER — Inpatient Hospital Stay: Payer: Medicare HMO

## 2023-11-30 ENCOUNTER — Inpatient Hospital Stay (HOSPITAL_BASED_OUTPATIENT_CLINIC_OR_DEPARTMENT_OTHER): Payer: Medicare HMO | Admitting: Genetic Counselor

## 2023-11-30 ENCOUNTER — Telehealth: Payer: Medicare HMO | Admitting: Gynecologic Oncology

## 2023-11-30 ENCOUNTER — Other Ambulatory Visit: Payer: Self-pay | Admitting: Genetic Counselor

## 2023-11-30 ENCOUNTER — Telehealth: Payer: Self-pay

## 2023-11-30 DIAGNOSIS — Z801 Family history of malignant neoplasm of trachea, bronchus and lung: Secondary | ICD-10-CM

## 2023-11-30 DIAGNOSIS — Z800 Family history of malignant neoplasm of digestive organs: Secondary | ICD-10-CM | POA: Diagnosis not present

## 2023-11-30 DIAGNOSIS — Z1379 Encounter for other screening for genetic and chromosomal anomalies: Secondary | ICD-10-CM

## 2023-11-30 DIAGNOSIS — C561 Malignant neoplasm of right ovary: Secondary | ICD-10-CM

## 2023-11-30 DIAGNOSIS — Z8543 Personal history of malignant neoplasm of ovary: Secondary | ICD-10-CM | POA: Diagnosis not present

## 2023-11-30 NOTE — Telephone Encounter (Signed)
Called to follow up on constipation. She has had several bm's and will continue laxatives for now. Her abdominal pain has gone away since having a bm.  She will call the office back for questions.

## 2023-12-01 ENCOUNTER — Encounter: Payer: Self-pay | Admitting: Genetic Counselor

## 2023-12-01 ENCOUNTER — Encounter: Payer: Self-pay | Admitting: Hematology and Oncology

## 2023-12-01 LAB — GENETIC SCREENING ORDER

## 2023-12-01 NOTE — Progress Notes (Signed)
REFERRING PROVIDER: Artis Delay, MD  PRIMARY PROVIDER:  Henrine Screws, MD  PRIMARY REASON FOR VISIT:  1. Malignant ovarian neoplasm, right (HCC)   2. Family history of pancreatic cancer    HISTORY OF PRESENT ILLNESS:   Lori Conrad, a 77 y.o. female, was seen for a [place] cancer genetics consultation at the request of Dr. Bertis Ruddy due to a personal and family history of cancer.  Lori Conrad presents to clinic today to discuss the possibility of a hereditary predisposition to cancer, to discuss genetic testing, and to further clarify her future cancer risks, as well as potential cancer risks for family members.   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  CANCER HISTORY:  Oncology History  Malignant ovarian neoplasm, right (HCC)  10/26/2023 Imaging   IR IMAGING GUIDED PORT INSERTION  Result Date: 11/01/2023 INDICATION: 77 year old female with suspected gynecologic malignancy requiring central venous access for chemotherapy. EXAM: IMPLANTED PORT A CATH PLACEMENT WITH ULTRASOUND AND FLUOROSCOPIC GUIDANCE COMPARISON:  None Available. MEDICATIONS: None. ANESTHESIA/SEDATION: Moderate (conscious) sedation was employed during this procedure. A total of Versed 1 mg and Fentanyl 50 mcg was administered intravenously. Moderate Sedation Time: 15 minutes. The patient's level of consciousness and vital signs were monitored continuously by radiology nursing throughout the procedure under my direct supervision. CONTRAST:  None FLUOROSCOPY TIME:  One mGy COMPLICATIONS: None immediate. PROCEDURE: The procedure, risks, benefits, and alternatives were explained to the patient. Questions regarding the procedure were encouraged and answered. The patient understands and consents to the procedure. The right neck and chest were prepped with chlorhexidine in a sterile fashion, and a sterile drape was applied covering the operative field. Maximum  barrier sterile technique with sterile gowns and gloves were used for the procedure. A timeout was performed prior to the initiation of the procedure. Ultrasound was used to examine the jugular vein which was compressible and free of internal echoes. A skin marker was used to demarcate the planned venotomy and port pocket incision sites. Local anesthesia was provided to these sites and the subcutaneous tunnel track with 1% lidocaine with 1:100,000 epinephrine. A small incision was created at the jugular access site and blunt dissection was performed of the subcutaneous tissues. Under ultrasound guidance, the jugular vein was accessed with a 21 ga micropuncture needle and an 0.018" wire was inserted to the superior vena cava. Real-time ultrasound guidance was utilized for vascular access including the acquisition of a permanent ultrasound image documenting patency of the accessed vessel. A 5 Fr micopuncture set was then used, through which a 0.035" Rosen wire was passed under fluoroscopic guidance into the inferior vena cava. An 8 Fr dilator was then placed over the wire. A subcutaneous port pocket was then created along the upper chest wall utilizing a combination of sharp and blunt dissection. The pocket was irrigated with sterile saline, packed with gauze, and observed for hemorrhage. A single lumen plastic power injectable port was chosen for placement. The 8 Fr catheter was tunneled from the port pocket site to the venotomy incision. The port was placed in the pocket. The external catheter was trimmed to appropriate length. The dilator was exchanged for an 8 Fr peel-away sheath under fluoroscopic guidance. The catheter was then placed through the sheath and the sheath was removed. Final catheter positioning was confirmed and documented with a fluoroscopic spot radiograph. The port was accessed with a Huber needle, aspirated, and flushed with heparinized saline. The  deep dermal layer of the port pocket incision  was closed with interrupted 3-0 Vicryl suture. Dermabond was then placed over the port pocket and neck incisions. The patient tolerated the procedure well without immediate post procedural complication. FINDINGS: After catheter placement, the tip lies within the superior cavoatrial junction. The catheter aspirates and flushes normally and is ready for immediate use. IMPRESSION: Successful placement of a power injectable Port-A-Cath via the right internal jugular vein. The catheter is ready for immediate use. Marliss Coots, MD [facility] Electronically Signed   By: Marliss Coots M.D.   On: 11/01/2023 13:22   MR ABDOMEN W WO CONTRAST  Result Date: 10/31/2023 CLINICAL DATA:  Pelvic mass.  Liver lesion EXAM: MRI ABDOMEN WITHOUT AND WITH CONTRAST TECHNIQUE: Multiplanar multisequence MR imaging of the abdomen was performed both before and after the administration of intravenous contrast. CONTRAST:  5mL GADAVIST GADOBUTROL 1 MMOL/ML IV SOLN COMPARISON:  CT 10/30/2023 and older. FINDINGS: Lower chest: Trace pleural fluid. Hepatobiliary: Numerous bright T2, low T1 nonenhancing foci identified consistent with benign cystic lesions. Many of these are under 15 mm. There are some larger foci identified such as segment 4 measuring 4.3 cm and caudate measuring 2.8 cm. Prior CT did demonstrate 1 lesion which is more complex anteriorly in the left hepatic lobe, segment 3 which on today's examination when taking into account motion show some progressive enhancement, is bright on T2 but not as bright as simple fluid and consistent with a small hemangioma. No specific aggressive liver lesion clearly identified today. Patent portal vein. Gallbladder is nondilated. No biliary ductal dilatation. Pancreas: Ectatic pancreatic duct diffusely measuring up to 6 mm, severe. No pancreatic focal atrophy, abnormal enhancement or abnormal T1 signal. No restricted diffusion along the  pancreas. Spleen:  Within normal limits in size and appearance. Adrenals/Urinary Tract: Adrenal glands are preserved. No enhancing renal mass or collecting system dilatation. Stomach/Bowel: Visualized bowel is nondilated. This includes visualized portions of the small and large bowel. The stomach is underdistended. Vascular/Lymphatic: Normal caliber aorta and IVC. Atherosclerotic changes along the aorta. Circumaortic left renal vein. Once again there is a abnormal lymph node identified anterior to the aorta in the upper abdomen on series 1602, image 58 measuring 2.3 x 1.8 cm. Few other small retroperitoneal nodes identified. Other:  Trace ascites.  Mesenteric stranding. Musculoskeletal: Curvature and degenerative changes along the spine. IMPRESSION: Multiple benign-appearing liver lesions including cysts and 1 hemangioma. Persistent enlarged upper abdominal retroperitoneal lymph node. Additional smaller but prominent nodes as well. With the pelvic findings these are worrisome for potential spread of neoplasm. Mild ascites. Electronically Signed   By: Karen Kays M.D.   On: 10/31/2023 14:10   MR PELVIS W WO CONTRAST  Result Date: 10/31/2023 CLINICAL DATA:  Pelvic mass EXAM: MRI PELVIS WITHOUT AND WITH CONTRAST TECHNIQUE: Multiplanar multisequence MR imaging of the pelvis was performed both before and after administration of intravenous contrast. CONTRAST:  5mL GADAVIST GADOBUTROL 1 MMOL/ML IV SOLN COMPARISON:  CT 10/30/2019 FINDINGS: Urinary Tract: Bladder is mildly distended with fluid. There is some mass effect along the posterior aspect of the bladder related to the adjacent complex mass. The bladder itself appears grossly intact. No abnormal wall enhancement. Grossly preserved course of the urethra. Bowel: The visualized bowel in the pelvis is nondilated. However there is lobular masslike area along the distal sigmoid colon with the areas of heterogeneous enhancement. The dominant lesion in this location on  series 4, image 29 measures 3.7  by 2.6 cm. There are several adjacent soft tissue nodules as well such as just posterior to the cervix and anterior to the bowel measuring 10 mm on series 22 image 30. Focus superior left lateral series 22, image 26 measures 2.3 x 1.7 cm. Additional foci elsewhere dependently in the pelvis including the presacral space. Vascular/Lymphatic: Atherosclerotic changes identified along the iliac vessels. No separate nodal enlargement identified. Reproductive: Uterus measures 8.2 by 2.0 by 3.4 cm. Endometrial stripe is less than 3 mm. Slightly heterogeneous myometrium. Anterior to the uterus is a large complex cystic and solid mass with heterogeneous enhancement of the solid component. Lesion measures 11.5 by 7.6 by 7.4 cm. The more solid component is right lateral inferior with a aggressive in enhancement measuring proximally 7.7 by 5.6 cm. Cephalocaudal length 8.5 cm. The cystic component more towards the left has a dimensions approaching 8.7 cm. There is surrounding free fluid and edema. Although this more in the central pelvis towards midline a right ovary is not seen as a separate structure. There is what appears to be a small postmenopausal of the left ovary measuring 15 mm. An ovarian neoplasm is a strong consideration. Other:  Small amount of free fluid in the pelvis.  Edema. Musculoskeletal: Curvature of the spine. Moderate degenerative changes of the lumbar spine with disc bulging and areas of stenosis greatest at L4-5. Degenerative changes of the pelvis and hips as well. Study is somewhat limited due to some artifacts postcontrast axial dataset as well as study being performed as a standard pelvis rather than a gynecologic pelvis exam. Please see separate dictation of abdomen MRI. IMPRESSION: Large complex cystic and solid pelvic mass centrally measuring up to 11.5 x 7.6 x 7.4 cm. Based on overall appearance this has worrisome for a neoplasm including an ovarian malignancy. Small  amount of ascites in the pelvis. Soft tissue enhancing aggressive nodules along the course of the sigmoid colon as well as in the adjacent fat and presacral spaces. With the larger central mass this very well could be spread of disease to adjacent structures rather than a primary colonic process but correlate with symptoms and if needed colonoscopy. Please see separate dictation of abdominal MRI. Electronically Signed   By: Karen Kays M.D.   On: 10/31/2023 14:01   CT CHEST W CONTRAST  Result Date: 10/31/2023 CLINICAL DATA:  77 year old female with suspected gynecologic malignancy. * Tracking Code: BO * EXAM: CT CHEST WITH CONTRAST TECHNIQUE: Multidetector CT imaging of the chest was performed during intravenous contrast administration. RADIATION DOSE REDUCTION: This exam was performed according to the departmental dose-optimization program which includes automated exposure control, adjustment of the mA and/or kV according to patient size and/or use of iterative reconstruction technique. CONTRAST:  75mL OMNIPAQUE IOHEXOL 350 MG/ML SOLN COMPARISON:  None Available. FINDINGS: Cardiovascular: The heart size is mildly enlarged. No pericardial effusion. Aortic atherosclerosis and coronary artery calcification. Mediastinum/Nodes: Trachea and esophagus appear unremarkable. The right lobe of thyroid gland appears surgically absent. No mediastinal or hilar adenopathy. Lungs/Pleura: No pleural effusion identified. Subsegmental atelectasis identified within the lingula and bilateral posterior lung bases. No signs of interstitial edema or airspace consolidation. No suspicious pulmonary nodule identified to suggest lung metastases. Upper Abdomen: No acute abnormality. Multiple liver cysts. Enlarged lymph node within the portal caval region measures 1.5 cm, image 155/3. Defer to report from CT AP dated 10/30/2019 for and same-day MRI of the abdomen pelvis for further details. Musculoskeletal: Mild curvature of the thoracic  spine and lumbar spine is  convex towards the left. Multilevel degenerative disc disease. No acute or suspicious osseous lesions. IMPRESSION: 1. No signs of metastatic disease to the chest. 2. Areas of subsegmental atelectasis noted within bilateral posterior lung bases and lingula. 3. Enlarged upper abdominal lymph node as above. In the setting of a known malignancy this is concerning for nodal metastasis. 4. Coronary artery calcifications. 5.  Aortic Atherosclerosis (ICD10-I70.0). Electronically Signed   By: Signa Kell M.D.   On: 10/31/2023 05:57   CT ABDOMEN PELVIS W CONTRAST  Result Date: 10/30/2023 CLINICAL DATA:  Abdominal pain. EXAM: CT ABDOMEN AND PELVIS WITH CONTRAST TECHNIQUE: Multidetector CT imaging of the abdomen and pelvis was performed using the standard protocol following bolus administration of intravenous contrast. RADIATION DOSE REDUCTION: This exam was performed according to the departmental dose-optimization program which includes automated exposure control, adjustment of the mA and/or kV according to patient size and/or use of iterative reconstruction technique. CONTRAST:  75mL OMNIPAQUE IOHEXOL 350 MG/ML SOLN COMPARISON:  Limb 1424 FINDINGS: Lower chest: No acute findings. Hepatobiliary: Multiple hepatic cysts evident. Scattered tiny hypodensities in the liver parenchyma are too small to characterize but are statistically most likely benign. No followup imaging is recommended. Tiny subcapsular lesion measured previously at 9 mm in the anterior left liver is stable on image 23/3 today, nonspecific. There is no evidence for gallstones, gallbladder wall thickening, or pericholecystic fluid. No intrahepatic or extrahepatic biliary dilation. Pancreas: Dilatation of the pancreatic duct to the head and body of pancreas is similar to prior. Spleen: No splenomegaly. No suspicious focal mass lesion. Adrenals/Urinary Tract: No adrenal nodule or mass. Kidneys unremarkable. No evidence for  hydroureter. Bladder is distended. Stomach/Bowel: Stomach is unremarkable. No gastric wall thickening. No evidence of outlet obstruction. Duodenum is normally positioned as is the ligament of Treitz. No small bowel wall thickening. No small bowel dilatation. Diverticular changes are noted in the left colon without evidence of diverticulitis. Vascular/Lymphatic: 16 mm short axis portal caval lymph node seen on 22/3. No para-aortic lymphadenopathy. No pelvic sidewall lymphadenopathy. Reproductive: Multiple uterine fibroids evident. As noted on prior study there is an area of the anterior cervix that appears to obliterate the fat plane between the cervix and the posterior wall of the bladder (see sagittal 86/7). Small soft tissue nodules are again noted in the cul-de-sac some of which may pertain to diverticuli, but others raise concern for peritoneal nodularity (see images 61 and 56 of series 3). Other: No substantial free fluid. Musculoskeletal: No worrisome lytic or sclerotic osseous abnormality. IMPRESSION: 1. Multiple uterine fibroids. As noted on prior study there is an area of the anterior cervix that appears to obliterate the fat plane between the cervix and the posterior wall of the bladder. This is concerning for a cervical mass. Gynecologic consultation recommended. 2. Small soft tissue nodules in the cul-de-sac some of which may relate to diverticuli, but others raise concern for peritoneal nodularity. Attention on follow-up recommended. PET-CT may prove helpful to further evaluate 3. 16 mm short axis portal caval lymph node, metastatic disease not excluded. 4. Left colonic diverticulosis without diverticulitis. Electronically Signed   By: Kennith Center M.D.   On: 10/30/2023 13:08   CT ABDOMEN PELVIS W CONTRAST  Result Date: 10/26/2023 CLINICAL DATA:  Pt w/ abnormal [place]; mass in pelvis; no h/o cancer; no pain; no urinary issues EXAM: CT ABDOMEN AND PELVIS WITH CONTRAST TECHNIQUE: Multidetector CT  imaging of the abdomen and pelvis was performed using the standard protocol following bolus administration of intravenous contrast.  RADIATION DOSE REDUCTION: This exam was performed according to the departmental dose-optimization program which includes automated exposure control, adjustment of the mA and/or kV according to patient size and/or use of iterative reconstruction technique. CONTRAST:  ISOVUE-300 IOPAMIDOL (ISOVUE-300) INJECTION 61% COMPARISON:  Ultrasound pelvis 09/01/2023, ultrasound thyroid 10/10/2023 FINDINGS: Lower chest: Mitral annular calcification. Aortic valve leaflet calcification. No acute abnormality. Hepatobiliary: Multiple fluid density lesions scattered throughout the left right hepatic lobe. There an indeterminate 0.9 cm left hepatic lobe hypodensity with Hounsfield unit of 77 (2:29). No gallstones, gallbladder wall thickening, or pericholecystic fluid. No biliary dilatation. Pancreas: No focal lesion. Normal pancreatic contour. No surrounding inflammatory changes. No main pancreatic ductal dilatation. Spleen: Normal in size without focal abnormality. Adrenals/Urinary Tract: No adrenal nodule bilaterally. Bilateral kidneys enhance symmetrically. No hydronephrosis. No hydroureter. The urinary bladder is unremarkable. On delayed imaging, there is no urothelial wall thickening and there are no filling defects in the opacified portions of the bilateral collecting systems or ureters. Stomach/Bowel: Stomach is within normal limits. No evidence of small bowel wall thickening or dilatation. Increased stool burden proximal to the distal sigmoid colon mass with stool throughout the ascending, transverse, descending colon. Short segment of distal sigmoid colon irregular bowel wall thickening (5:26, 2:62). No large bowel luminal dilatation. Colonic diverticulosis appendix appears normal. Vascular/Lymphatic: No abdominal aorta or iliac aneurysm. Severe atherosclerotic plaque of the aorta and its  branches with severe narrowing of the proximal celiac artery (6:80). No abdominal, pelvic, or inguinal lymphadenopathy. Reproductive: There is a heterogeneous solid and cystic 11 x 9 cm mass arising from the uterine fundus. Finding is noted to invade into the urinary bladder dome (6:81, 5:56 close) where there is loss of intraperitoneal and lower ring of the urinary bladder wall margin. The mass is noted to abut and appears to be inseparable from a short segment of distal sigmoid colon in the region of irregular bowel wall thickening. Other: No intraperitoneal free fluid. No intraperitoneal free gas. No organized fluid collection. Musculoskeletal: No abdominal wall hernia or abnormality. No suspicious lytic or blastic osseous lesions. No acute displaced fracture. Multilevel severe degenerative changes of the spine. Grade 1 anterolisthesis of L4 on L5 and L5 on S1. Mild retrolisthesis of L2 on L3 and L3 on L4. Dextroscoliosis centered at the L3-L4 level. IMPRESSION: 1. An 11 x 9 cm heterogeneous solid and cystic mass arises from the uterine fundus and is noted to invade the urinary bladder dome wall as well as the distal sigmoid colon. No associated bowel obstruction; however, constipation proximal to irregular bowel wall thickening/mass. No associated stercoral colitis. Finding consistent with malignancy. Recommend gynecologic consultation. When the patient is clinically stable and able to follow directions and hold their breath (preferably as an outpatient) further evaluation with dedicated MRI with and without contrast should be considered. 2. Indeterminate 0.9 cm left hepatic lobe hypodense lesion with a density of 77 HU. Question metastasis versus primary hepatic lesion. 3. Stool throughout the colon 4. Colonic diverticulosis with no acute diverticulitis. 5. 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 6.  Aortic Atherosclerosis (ICD10-I70.0)-severe. These results will be called to the ordering clinician or  representative by the Radiologist Assistant, and communication documented in the PACS or [employer]. Electronically Signed   By: Tish Frederickson M.D.   On: 10/26/2023 18:42   DG BONE DENSITY (DXA)  Result Date: 10/26/2023 EXAM: DUAL X-RAY ABSORPTIOMETRY (DXA) FOR BONE MINERAL DENSITY IMPRESSION: Referring Physician:  Henrine Screws Your patient completed a bone mineral density test  using [employer] system (analysis version: 16). Technologist: BEC PATIENT: Name: Denean, Samand Patient ID: [id number] Birth Date: December 18, 1945 Height: 60.5 in. Sex: Female Measured: 10/26/2023 Weight: 110.2 lbs. Indications: Advanced Age, Caucasian, Estrogen Deficient, Height Loss (781.91), History of Osteoporosis, Levothyroxine, Postmenopausal Fractures: Left Ankle Treatments: Calcium (E943.0), Vitamin D (E933.5) ASSESSMENT: The BMD measured at Forearm Radius 33% is 0.624 g/cm2 with a T-score of -2.9. This patient's diagnostic category is OSTEOPOROSIS according to World Health Organization [facility]) criteria. Comparison to 10/16/2019. Since the prior study, there has been a SIGNIFICANT DECREASE in bone mineral density of the hips (-4.6%). The lumbar spine was excluded due to being excluded from prior exam. The quality of the exam is good. Site Region Measured Date Measured Age YA BMD Significant CHANGE T-score Right Forearm Radius 33% 10/26/2023 78.0 -2.9 0.624 g/cm2 Right Forearm Radius 33% 10/16/2019 74.0 -2.6 0.653 g/cm2 DualFemur Neck Left 10/26/2023 78.0 -0.6 0.952 g/cm2 DualFemur Neck Left 10/16/2019 74.0 -0.5 0.962 g/cm2 DualFemur Total Mean 10/26/2023 78.0 -0.7 0.918 g/cm2 * DualFemur Total Mean 10/16/2019 74.0 -0.4 0.962 g/cm2 World Health Organization [facility]) criteria for post-menopausal, Caucasian Women: Normal       T-score at or above -1 SD Osteopenia   T-score between -1 and -2.5 SD Osteoporosis T-score at or below -2.5 SD RECOMMENDATION: 1. All patients should optimize calcium and vitamin D intake. 2. Consider  FDA-approved medical therapies in postmenopausal women and men aged 33 years and older, based on the following: a. A hip or vertebral (clinical or morphometric) fracture. b. T-score = -2.5 at the femoral neck or spine after appropriate evaluation to exclude secondary causes. c. Low bone mass (T-score between -1.0 and -2.5 at the femoral neck or spine) and a 10-year probability of a hip fracture = 3% or a 10-year probability of a major osteoporosis-related fracture = 20% based on the [place]-adapted WHO algorithm. d. Clinician judgment and/or patient preferences may indicate treatment for people with 10-year fracture probabilities above or below these levels. FOLLOW-UP: Patients with diagnosis of osteoporosis or at high risk for fracture should have regular bone mineral density tests.? Patients eligible for Medicare are allowed routine testing every 2 years.? The testing frequency can be increased to one year for patients who have rapidly progressing disease, are receiving or discontinuing medical therapy to restore bone mass, or have additional risk factors. I have reviewed this study and agree with the findings. [facility] Electronically Signed   By: Harmon Pier M.D.   On: 10/26/2023 12:30      11/08/2023 Pathology Results   SURGICAL PATHOLOGY CASE: [phone] PATIENT: [facility] Surgical Pathology Report  Clinical History: Pelvic mass, suspected malignancy (crm)   FINAL MICROSCOPIC DIAGNOSIS:  A. PELVIC SIDEWALL NODULE, RIGHT, EXCISION: - High-grade serous carcinoma, see comment  B. ABDOMINAL WALL #1, ANTERIOR, EXCISION: - High-grade serous carcinoma  COMMENT:  A.  Immunohistochemical stain show that the tumor cells are positive for CK7, PAX8, and p16 (diffuse overexpression).  Immunostain for p53 shows a clonal null expression pattern.  Immunostains for CK20 is negative. This immunoprofile is consistent with the above interpretation and suggestive of an ovarian primary.     11/14/2023 Initial Diagnosis   Malignant ovarian neoplasm, right (HCC)   11/14/2023 Cancer Staging   Staging form: Ovary, Fallopian Tube, and Primary Peritoneal Carcinoma, AJCC 8th Edition - Clinical stage from 11/14/2023: FIGO Stage IIIC (cT3c, cN1, cM0) - Signed by Artis Delay, MD on 11/14/2023 Stage prefix: Initial diagnosis   11/20/2023 Tumor Marker  Patient's tumor was tested for the following markers: CA-125. Results of the tumor marker test revealed 169.   11/22/2023 -  Chemotherapy   Patient is on Treatment Plan : OVARIAN Carboplatin (AUC 6) + Paclitaxel (175) q21d X 6 Cycles      Past Medical History:  Diagnosis Date   Arthritis    Dilated aortic root (HCC)    Hyperlipidemia    Hypothyroidism    Macular degeneration     Past Surgical History:  Procedure Laterality Date   ANKLE FRACTURE SURGERY Left    BROW LIFT Bilateral 05/17/2022   Procedure: BILATERAL UPPER BLEPHAROPLASTY WITH PTOSIS REPAIR;  Surgeon: Janne Napoleon, MD;  Location: [facility] OR;  Service: Plastics;  Laterality: Bilateral;  1.5 hours   BUNIONECTOMY Left    COLONOSCOPY     EYE SURGERY     FLEXIBLE SIGMOIDOSCOPY N/A 11/02/2023   Procedure: FLEXIBLE SIGMOIDOSCOPY;  Surgeon: Kerin Salen, MD;  Location: [facility] ENDOSCOPY;  Service: Gastroenterology;  Laterality: N/A;   IR IMAGING GUIDED PORT INSERTION  11/01/2023   LAPAROSCOPY N/A 11/08/2023   Procedure: LAPAROSCOPY DIAGNOSTIC WITH BIOPSY;  Surgeon: Carver Fila, MD;  Location: [facility] ORS;  Service: Gynecology;  Laterality: N/A;   REFRACTIVE SURGERY     laser for macular degeneration   SHOULDER ARTHROSCOPY     TONSILLECTOMY      Social History   Socioeconomic History   Marital status: Widowed    Spouse name: Not on file   Number of children: 2   Years of education: Not on file   Highest education level: Not on file  Occupational History   Not on file  Tobacco Use   Smoking status: Former    Current packs/day: 0.00    Types: Cigarettes    Quit  date: 05/15/1988    Years since quitting: 35.5   Smokeless tobacco: Never  Vaping Use   Vaping status: Never Used  Substance and Sexual Activity   Alcohol use: Yes    Alcohol/week: 14.0 standard drinks of alcohol    Types: 14 drink(s) per week    Comment: white wine   Drug use: No   Sexual activity: Not Currently    Partners: Male  Other Topics Concern   Not on file  Social History Narrative   Not on file   Social Drivers of Health   Financial Resource Strain: Not on file  Food Insecurity: No Food Insecurity (10/30/2023)   Hunger Vital Sign    Worried About Running Out of Food in the Last Year: Never true    Ran Out of Food in the Last Year: Never true  Transportation Needs: No Transportation Needs (10/30/2023)   PRAPARE - Administrator, Civil Service (Medical): No    Lack of Transportation (Non-Medical): No  Physical Activity: Not on file  Stress: Not on file  Social Connections: Not on file     FAMILY HISTORY:  We obtained a detailed, 4-generation family history.  Significant diagnoses are listed below: Family History  Problem Relation Age of Onset   Lung cancer Mother 2 - 23       smoked   Stroke Mother 63   Alzheimer's disease Father    CAD Father        95% occlusion of L Main & RCA, severe descending aorta, arterial & arteriolonephrosclerosis    Cholecystitis Father    Esophagitis Father    Cancer Brother 88 - 5       unknown type, agent orange  exposure   Pancreatic cancer Daughter 70     Lori Conrad's daughter was diagnosed with pancreatic cancer at age 64, she died at age 105 due to cancer metastasis. Her brother was diagnosed with an unknown type of cancer, he was exposed to agent orange, he died in his late 18s due to cancer metastasis. Lori Conrad mother was diagnosed with lung cancer in her 59s, she smoked and died in her 89s. Lori Conrad is unaware of previous family history of genetic testing for hereditary cancer risks. Of note, her husband  had negative hereditary cancer genetic testing this year, he has since passed. There is no reported Ashkenazi Jewish ancestry.  GENETIC COUNSELING ASSESSMENT: Lori Conrad is a 77 y.o. female with a personal and family history of cancer which is somewhat suggestive of a hereditary predisposition to cancer given her personal history of ovarian cancer. We, therefore, discussed and recommended the following at today's visit.   DISCUSSION: We discussed that 5 - 10% of cancer is hereditary, with most cases of ovarian cancer associated with BRCA1/2.  There are other genes that can be associated with hereditary ovarian cancer syndromes.  We discussed that testing is beneficial for several reasons including knowing how to follow individuals after completing their treatment, identifying whether potential treatment options would be beneficial, and understanding if other family members could be at risk for cancer and allowing them to undergo genetic testing.   We reviewed the characteristics, features and inheritance patterns of hereditary cancer syndromes. We also discussed genetic testing, including the appropriate family members to test, the process of testing, insurance coverage and turn-around-time for results. We discussed the implications of a negative, positive, carrier and/or variant of uncertain significant result. We recommended Lori Conrad pursue genetic testing for a panel that includes genes associated with ovarian and pancreatic cancer.   Lori Conrad elected to have [facility] CancerNext-Expanded Panel. The CancerNext-Expanded gene panel offered by [facility] and includes sequencing, rearrangement, and RNA analysis for the following 76 genes: AIP, ALK, APC, ATM, AXIN2, BAP1, BARD1, BMPR1A, BRCA1, BRCA2, BRIP1, CDC73, CDH1, CDK4, CDKN1B, CDKN2A, CEBPA, CHEK2, CTNNA1, DDX41, DICER1, ETV6, FH, FLCN, GATA2, LZTR1, MAX, MBD4, MEN1, MET, MLH1, MSH2, MSH3, MSH6, MUTYH, NF1, NF2, NTHL1, PALB2, PHOX2B, PMS2, POT1,  PRKAR1A, PTCH1, PTEN, RAD51C, RAD51D, RB1, RET, RUNX1, SDHA, SDHAF2, SDHB, SDHC, SDHD, SMAD4, SMARCA4, SMARCB1, SMARCE1, STK11, SUFU, TMEM127, TP53, TSC1, TSC2, VHL, and WT1 (sequencing and deletion/duplication); EGFR, HOXB13, KIT, MITF, PDGFRA, POLD1, and POLE (sequencing only); EPCAM and GREM1 (deletion/duplication only).    Based on Lori Conrad's personal and family history of cancer, she meets medical criteria for genetic testing. Despite that she meets criteria, she may still have an out of pocket cost. We discussed that if her out of pocket cost for testing is over $100, the laboratory should contact them to discuss self-pay prices, patient pay assistance programs, if applicable, and other billing options.  PLAN: After considering the risks, benefits, and limitations, Lori Conrad provided informed consent to pursue genetic testing and the blood sample was sent to [facility] for analysis of the CancerNext-Expanded Panel. Results should be available within approximately 2-3 weeks' time, at which point they will be disclosed by telephone to Lori Conrad, as will any additional recommendations warranted by these results. Lori Conrad will receive a summary of her genetic counseling visit and a copy of her results once available. This information will also be available in Epic.   Lori Conrad questions were answered to her satisfaction today. Our contact  information was provided should additional questions or concerns arise. Thank you for the referral and allowing [place] to share in the care of your patient.   Lalla Brothers, MS, [facility] Genetic Counselor [email or web address] (P) [phone]  The patient was seen for a total of 25 minutes in face-to-face genetic counseling.  The patient brought her son. Drs. Pamelia Hoit and/or Mosetta Putt were available to discuss this case as needed.   _______________________________________________________________________ For Office Staff:  Number of people involved in  session: 2 Was an Intern/ student involved with case: no

## 2023-12-04 ENCOUNTER — Other Ambulatory Visit: Payer: Self-pay

## 2023-12-04 ENCOUNTER — Encounter (HOSPITAL_COMMUNITY): Payer: Self-pay | Admitting: Psychiatry

## 2023-12-04 ENCOUNTER — Telehealth: Payer: Self-pay

## 2023-12-04 DIAGNOSIS — C561 Malignant neoplasm of right ovary: Secondary | ICD-10-CM | POA: Diagnosis not present

## 2023-12-04 DIAGNOSIS — R69 Illness, unspecified: Secondary | ICD-10-CM | POA: Diagnosis not present
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
## 2023-12-04 NOTE — Telephone Encounter (Signed)
[employer] and given below message. She verbalized understanding and will contact her PCP. Sent referral to Child psychotherapist.

## 2023-12-04 NOTE — Telephone Encounter (Signed)
I do not prescribed xanax long term for anxiety I recommend either PCP to help her or get social worker to see if we can support her emotionally

## 2023-12-05 ENCOUNTER — Encounter (HOSPITAL_COMMUNITY): Payer: Self-pay | Admitting: Psychiatry

## 2023-12-07 ENCOUNTER — Encounter (HOSPITAL_COMMUNITY): Payer: Self-pay

## 2023-12-07 ENCOUNTER — Other Ambulatory Visit: Payer: Self-pay

## 2023-12-07 ENCOUNTER — Ambulatory Visit (HOSPITAL_COMMUNITY)
Admission: RE | Admit: 2023-12-07 | Discharge: 2023-12-07 | Disposition: A | Discharge status: Home / Self Care | Payer: Medicare HMO | Source: Ambulatory Visit | Attending: Physician Assistant | Admitting: Physician Assistant

## 2023-12-07 ENCOUNTER — Inpatient Hospital Stay: Payer: Medicare HMO | Admitting: Physician Assistant

## 2023-12-07 ENCOUNTER — Emergency Department (HOSPITAL_COMMUNITY): Payer: Medicare HMO

## 2023-12-07 ENCOUNTER — Telehealth: Payer: Self-pay | Admitting: *Deleted

## 2023-12-07 ENCOUNTER — Inpatient Hospital Stay: Payer: Medicare HMO

## 2023-12-07 ENCOUNTER — Emergency Department (HOSPITAL_COMMUNITY)
Admission: EM | Admit: 2023-12-07 | Discharge: 2023-12-07 | Disposition: A | Discharge status: Home / Self Care | Payer: Medicare HMO | Attending: Emergency Medicine | Admitting: Emergency Medicine

## 2023-12-07 ENCOUNTER — Other Ambulatory Visit: Payer: Self-pay | Admitting: *Deleted

## 2023-12-07 VITALS — BP 132/77 | HR 81 | Temp 97.3°F | Resp 16 | Wt 109.9 lb

## 2023-12-07 DIAGNOSIS — I517 Cardiomegaly: Secondary | ICD-10-CM | POA: Diagnosis not present

## 2023-12-07 DIAGNOSIS — K5909 Other constipation: Secondary | ICD-10-CM

## 2023-12-07 DIAGNOSIS — K529 Noninfective gastroenteritis and colitis, unspecified: Secondary | ICD-10-CM | POA: Diagnosis not present

## 2023-12-07 DIAGNOSIS — R1900 Intra-abdominal and pelvic swelling, mass and lump, unspecified site: Secondary | ICD-10-CM

## 2023-12-07 DIAGNOSIS — R1903 Right lower quadrant abdominal swelling, mass and lump: Secondary | ICD-10-CM

## 2023-12-07 DIAGNOSIS — N858 Other specified noninflammatory disorders of uterus: Secondary | ICD-10-CM | POA: Insufficient documentation

## 2023-12-07 DIAGNOSIS — R197 Diarrhea, unspecified: Secondary | ICD-10-CM | POA: Insufficient documentation

## 2023-12-07 DIAGNOSIS — C541 Malignant neoplasm of endometrium: Secondary | ICD-10-CM

## 2023-12-07 DIAGNOSIS — R1031 Right lower quadrant pain: Secondary | ICD-10-CM | POA: Insufficient documentation

## 2023-12-07 DIAGNOSIS — N83519 Torsion of ovary and ovarian pedicle, unspecified side: Secondary | ICD-10-CM

## 2023-12-07 DIAGNOSIS — C561 Malignant neoplasm of right ovary: Secondary | ICD-10-CM | POA: Insufficient documentation

## 2023-12-07 DIAGNOSIS — R140 Abdominal distension (gaseous): Secondary | ICD-10-CM | POA: Diagnosis not present

## 2023-12-07 DIAGNOSIS — N838 Other noninflammatory disorders of ovary, fallopian tube and broad ligament: Secondary | ICD-10-CM | POA: Diagnosis not present

## 2023-12-07 DIAGNOSIS — K5900 Constipation, unspecified: Secondary | ICD-10-CM | POA: Diagnosis not present

## 2023-12-07 LAB — MAGNESIUM
Magnesium: 1.9 mg/dL (ref 1.7–2.4)
Magnesium: 2 mg/dL (ref 1.7–2.4)

## 2023-12-07 LAB — CMP (CANCER CENTER ONLY)
ALT: 13 U/L (ref 0–44)
AST: 20 U/L (ref 15–41)
Albumin: 4.2 g/dL (ref 3.5–5.0)
Alkaline Phosphatase: 70 U/L (ref 38–126)
Anion gap: 9 (ref 5–15)
BUN: 14 mg/dL (ref 8–23)
CO2: 29 mmol/L (ref 22–32)
Calcium: 9.6 mg/dL (ref 8.9–10.3)
Chloride: 99 mmol/L (ref 98–111)
Creatinine: 0.56 mg/dL (ref 0.44–1.00)
GFR, Estimated: >60 mL/min (ref >60)
Glucose, Bld: 100 mg/dL — ABNORMAL HIGH (ref 70–99)
Potassium: 3.7 mmol/L (ref 3.5–5.1)
Sodium: 137 mmol/L (ref 135–145)
Total Bilirubin: 0.6 mg/dL (ref <1.2)
Total Protein: 6.8 g/dL (ref 6.5–8.1)

## 2023-12-07 LAB — CBC WITH DIFFERENTIAL/PLATELET
Abs Immature Granulocytes: 0.01 10*3/uL (ref 0.00–0.07)
Basophils Absolute: 0 10*3/uL (ref 0.0–0.1)
Basophils Relative: 1 %
Eosinophils Absolute: 0 10*3/uL (ref 0.0–0.5)
Eosinophils Relative: 1 %
HCT: 38.8 % (ref 36.0–46.0)
Hemoglobin: 13.2 g/dL (ref 12.0–15.0)
Immature Granulocytes: 1 %
Lymphocytes Relative: 18 %
Lymphs Abs: 0.4 10*3/uL — ABNORMAL LOW (ref 0.7–4.0)
MCH: 30.3 pg (ref 26.0–34.0)
MCHC: 34 g/dL (ref 30.0–36.0)
MCV: 89.2 fL (ref 80.0–100.0)
Monocytes Absolute: 0.7 10*3/uL (ref 0.1–1.0)
Monocytes Relative: 32 %
Neutro Abs: 1.1 10*3/uL — ABNORMAL LOW (ref 1.7–7.7)
Neutrophils Relative %: 47 %
Platelets: 201 10*3/uL (ref 150–400)
RBC: 4.35 MIL/uL (ref 3.87–5.11)
RDW: 13.8 % (ref 11.5–15.5)
WBC: 2.2 10*3/uL — ABNORMAL LOW (ref 4.0–10.5)
nRBC: 0 % (ref 0.0–0.2)

## 2023-12-07 MED ORDER — AMOXICILLIN-POT CLAVULANATE 875-125 MG PO TABS: 1.0000 | ORAL_TABLET | Freq: Two times a day (BID) | ORAL | 0 refills | Status: DC

## 2023-12-07 MED ORDER — ONDANSETRON HCL 4 MG/2ML IJ SOLN: 4.0000 mg | Freq: Once | INTRAMUSCULAR | Status: DC

## 2023-12-07 MED ORDER — HYDROMORPHONE HCL 1 MG/ML IJ SOLN: 0.5000 mg | Freq: Once | INTRAMUSCULAR | Status: DC

## 2023-12-07 MED ORDER — ONDANSETRON HCL 4 MG/2ML IJ SOLN
4.0000 mg | Freq: Once | INTRAMUSCULAR | Status: AC
  Administered 2023-12-07: 4 mg via INTRAVENOUS
  Filled 2023-12-07: qty 2

## 2023-12-07 MED ORDER — IOHEXOL 300 MG/ML  SOLN
100.0000 mL | Freq: Once | INTRAMUSCULAR | Status: AC | PRN
  Administered 2023-12-07: 100 mL via INTRAVENOUS

## 2023-12-07 MED ORDER — HYDROMORPHONE HCL 1 MG/ML IJ SOLN
0.5000 mg | Freq: Once | INTRAMUSCULAR | Status: AC
  Administered 2023-12-07: 0.5 mg via INTRAVENOUS
  Filled 2023-12-07: qty 1

## 2023-12-07 MED ORDER — SODIUM CHLORIDE 0.9 % IV BOLUS
1000.0000 mL | Freq: Once | INTRAVENOUS | Status: AC
  Administered 2023-12-07: 1000 mL via INTRAVENOUS

## 2023-12-07 NOTE — Progress Notes (Signed)
Symptom Management Consult Note [facility]    Patient Care Team: Henrine Screws, MD as PCP - General (Family Medicine) Paulina Fusi Servando Snare, RN as Oncology Nurse Navigator (Oncology)    Name / MRN / DOBEllanor Conrad  [id number]  [birth year]   Date of visit: 12/07/2023   Chief Complaint/Reason for visit: abdominal pain   Current Therapy: Carboplatin and taxol  Last treatment:  Day 1   Cycle 1 on 11/22/23   ASSESSMENT & PLAN: Patient is a 77 y.o. female with oncologic history of right ovarian cancer followed by Dr. Bertis Ruddy.  I have viewed most recent oncology note and lab work.    #Right ovarian cancer - Next appointment with oncologist is 12/14/23   #Abdominal pain -Acute x 2 days. -On exam today patient has significant right lower quadrant abdominal tenderness.  Abdomen is distended and hard in the area as well.  Normal active bowel sounds. -Abdominal x-ray was examined formed prior to visit today and shows nonobstructive gas pattern with below average volume of retained stool. -Chart review shows op note for diagnostic laparoscopically with peritoneal biopsies was performed on 11/08/2023.  Postop diagnosis included right ovarian torsion.  Per the note it appears forced right adnexa resolved once patient was lying flat. -Engaged in shared decision making with patient and son regarding outpatient workup.  With the severity of her symptoms and history of torsion patient will require emergent evaluation. -Will collect basic labs and have RN place IV here in clinic. Planned to give IV pain medicine Dilaudid in clinic however there was a delay in pharmacy sending the medicine so patient elected to wait until ED evaluation. - She was transferred to ED by myself and RN.  Report given to accepting RN. Labs have not resulted at time of ED transfer.   Heme/Onc History: Oncology History  Malignant ovarian neoplasm, right (HCC)  10/26/2023 Imaging   IR IMAGING GUIDED  PORT INSERTION  Result Date: 11/01/2023 INDICATION: 77 year old female with suspected gynecologic malignancy requiring central venous access for chemotherapy. EXAM: IMPLANTED PORT A CATH PLACEMENT WITH ULTRASOUND AND FLUOROSCOPIC GUIDANCE COMPARISON:  None Available. MEDICATIONS: None. ANESTHESIA/SEDATION: Moderate (conscious) sedation was employed during this procedure. A total of Versed 1 mg and Fentanyl 50 mcg was administered intravenously. Moderate Sedation Time: 15 minutes. The patient's level of consciousness and vital signs were monitored continuously by radiology nursing throughout the procedure under my direct supervision. CONTRAST:  None FLUOROSCOPY TIME:  One mGy COMPLICATIONS: None immediate. PROCEDURE: The procedure, risks, benefits, and alternatives were explained to the patient. Questions regarding the procedure were encouraged and answered. The patient understands and consents to the procedure. The right neck and chest were prepped with chlorhexidine in a sterile fashion, and a sterile drape was applied covering the operative field. Maximum barrier sterile technique with sterile gowns and gloves were used for the procedure. A timeout was performed prior to the initiation of the procedure. Ultrasound was used to examine the jugular vein which was compressible and free of internal echoes. A skin marker was used to demarcate the planned venotomy and port pocket incision sites. Local anesthesia was provided to these sites and the subcutaneous tunnel track with 1% lidocaine with 1:100,000 epinephrine. A small incision was created at the jugular access site and blunt dissection was performed of the subcutaneous tissues. Under ultrasound guidance, the jugular vein was accessed with a 21 ga micropuncture needle and an 0.018" wire was inserted to the superior vena cava. Real-time ultrasound  guidance was utilized for vascular access including the acquisition of a permanent ultrasound image documenting  patency of the accessed vessel. A 5 Fr micopuncture set was then used, through which a 0.035" Rosen wire was passed under fluoroscopic guidance into the inferior vena cava. An 8 Fr dilator was then placed over the wire. A subcutaneous port pocket was then created along the upper chest wall utilizing a combination of sharp and blunt dissection. The pocket was irrigated with sterile saline, packed with gauze, and observed for hemorrhage. A single lumen plastic power injectable port was chosen for placement. The 8 Fr catheter was tunneled from the port pocket site to the venotomy incision. The port was placed in the pocket. The external catheter was trimmed to appropriate length. The dilator was exchanged for an 8 Fr peel-away sheath under fluoroscopic guidance. The catheter was then placed through the sheath and the sheath was removed. Final catheter positioning was confirmed and documented with a fluoroscopic spot radiograph. The port was accessed with a Huber needle, aspirated, and flushed with heparinized saline. The deep dermal layer of the port pocket incision was closed with interrupted 3-0 Vicryl suture. Dermabond was then placed over the port pocket and neck incisions. The patient tolerated the procedure well without immediate post procedural complication. FINDINGS: After catheter placement, the tip lies within the superior cavoatrial junction. The catheter aspirates and flushes normally and is ready for immediate use. IMPRESSION: Successful placement of a power injectable Port-A-Cath via the right internal jugular vein. The catheter is ready for immediate use. Marliss Coots, MD [facility] Electronically Signed   By: Marliss Coots M.D.   On: 11/01/2023 13:22   MR ABDOMEN W WO CONTRAST  Result Date: 10/31/2023 CLINICAL DATA:  Pelvic mass.  Liver lesion EXAM: MRI ABDOMEN WITHOUT AND WITH CONTRAST TECHNIQUE: Multiplanar multisequence MR imaging of  the abdomen was performed both before and after the administration of intravenous contrast. CONTRAST:  5mL GADAVIST GADOBUTROL 1 MMOL/ML IV SOLN COMPARISON:  CT 10/30/2023 and older. FINDINGS: Lower chest: Trace pleural fluid. Hepatobiliary: Numerous bright T2, low T1 nonenhancing foci identified consistent with benign cystic lesions. Many of these are under 15 mm. There are some larger foci identified such as segment 4 measuring 4.3 cm and caudate measuring 2.8 cm. Prior CT did demonstrate 1 lesion which is more complex anteriorly in the left hepatic lobe, segment 3 which on today's examination when taking into account motion show some progressive enhancement, is bright on T2 but not as bright as simple fluid and consistent with a small hemangioma. No specific aggressive liver lesion clearly identified today. Patent portal vein. Gallbladder is nondilated. No biliary ductal dilatation. Pancreas: Ectatic pancreatic duct diffusely measuring up to 6 mm, severe. No pancreatic focal atrophy, abnormal enhancement or abnormal T1 signal. No restricted diffusion along the pancreas. Spleen:  Within normal limits in size and appearance. Adrenals/Urinary Tract: Adrenal glands are preserved. No enhancing renal mass or collecting system dilatation. Stomach/Bowel: Visualized bowel is nondilated. This includes visualized portions of the small and large bowel. The stomach is underdistended. Vascular/Lymphatic: Normal caliber aorta and IVC. Atherosclerotic changes along the aorta. Circumaortic left renal vein. Once again there is a abnormal lymph node identified anterior to the aorta in the upper abdomen on series 1602, image 58 measuring 2.3 x 1.8 cm. Few other small retroperitoneal nodes identified. Other:  Trace ascites.  Mesenteric stranding. Musculoskeletal: Curvature and degenerative changes along the spine. IMPRESSION: Multiple benign-appearing liver lesions including cysts  and 1 hemangioma. Persistent enlarged upper abdominal  retroperitoneal lymph node. Additional smaller but prominent nodes as well. With the pelvic findings these are worrisome for potential spread of neoplasm. Mild ascites. Electronically Signed   By: Karen Kays M.D.   On: 10/31/2023 14:10   MR PELVIS W WO CONTRAST  Result Date: 10/31/2023 CLINICAL DATA:  Pelvic mass EXAM: MRI PELVIS WITHOUT AND WITH CONTRAST TECHNIQUE: Multiplanar multisequence MR imaging of the pelvis was performed both before and after administration of intravenous contrast. CONTRAST:  5mL GADAVIST GADOBUTROL 1 MMOL/ML IV SOLN COMPARISON:  CT 10/30/2019 FINDINGS: Urinary Tract: Bladder is mildly distended with fluid. There is some mass effect along the posterior aspect of the bladder related to the adjacent complex mass. The bladder itself appears grossly intact. No abnormal wall enhancement. Grossly preserved course of the urethra. Bowel: The visualized bowel in the pelvis is nondilated. However there is lobular masslike area along the distal sigmoid colon with the areas of heterogeneous enhancement. The dominant lesion in this location on series 4, image 29 measures 3.7 by 2.6 cm. There are several adjacent soft tissue nodules as well such as just posterior to the cervix and anterior to the bowel measuring 10 mm on series 22 image 30. Focus superior left lateral series 22, image 26 measures 2.3 x 1.7 cm. Additional foci elsewhere dependently in the pelvis including the presacral space. Vascular/Lymphatic: Atherosclerotic changes identified along the iliac vessels. No separate nodal enlargement identified. Reproductive: Uterus measures 8.2 by 2.0 by 3.4 cm. Endometrial stripe is less than 3 mm. Slightly heterogeneous myometrium. Anterior to the uterus is a large complex cystic and solid mass with heterogeneous enhancement of the solid component. Lesion measures 11.5 by 7.6 by 7.4 cm. The more solid component is right lateral inferior with a aggressive in enhancement measuring proximally 7.7  by 5.6 cm. Cephalocaudal length 8.5 cm. The cystic component more towards the left has a dimensions approaching 8.7 cm. There is surrounding free fluid and edema. Although this more in the central pelvis towards midline a right ovary is not seen as a separate structure. There is what appears to be a small postmenopausal of the left ovary measuring 15 mm. An ovarian neoplasm is a strong consideration. Other:  Small amount of free fluid in the pelvis.  Edema. Musculoskeletal: Curvature of the spine. Moderate degenerative changes of the lumbar spine with disc bulging and areas of stenosis greatest at L4-5. Degenerative changes of the pelvis and hips as well. Study is somewhat limited due to some artifacts postcontrast axial dataset as well as study being performed as a standard pelvis rather than a gynecologic pelvis exam. Please see separate dictation of abdomen MRI. IMPRESSION: Large complex cystic and solid pelvic mass centrally measuring up to 11.5 x 7.6 x 7.4 cm. Based on overall appearance this has worrisome for a neoplasm including an ovarian malignancy. Small amount of ascites in the pelvis. Soft tissue enhancing aggressive nodules along the course of the sigmoid colon as well as in the adjacent fat and presacral spaces. With the larger central mass this very well could be spread of disease to adjacent structures rather than a primary colonic process but correlate with symptoms and if needed colonoscopy. Please see separate dictation of abdominal MRI. Electronically Signed   By: Karen Kays M.D.   On: 10/31/2023 14:01   CT CHEST W CONTRAST  Result Date: 10/31/2023 CLINICAL DATA:  77 year old female with suspected gynecologic malignancy. * Tracking Code: BO * EXAM: CT CHEST WITH  CONTRAST TECHNIQUE: Multidetector CT imaging of the chest was performed during intravenous contrast administration. RADIATION DOSE REDUCTION: This exam was performed according to the departmental dose-optimization program which  includes automated exposure control, adjustment of the mA and/or kV according to patient size and/or use of iterative reconstruction technique. CONTRAST:  75mL OMNIPAQUE IOHEXOL 350 MG/ML SOLN COMPARISON:  None Available. FINDINGS: Cardiovascular: The heart size is mildly enlarged. No pericardial effusion. Aortic atherosclerosis and coronary artery calcification. Mediastinum/Nodes: Trachea and esophagus appear unremarkable. The right lobe of thyroid gland appears surgically absent. No mediastinal or hilar adenopathy. Lungs/Pleura: No pleural effusion identified. Subsegmental atelectasis identified within the lingula and bilateral posterior lung bases. No signs of interstitial edema or airspace consolidation. No suspicious pulmonary nodule identified to suggest lung metastases. Upper Abdomen: No acute abnormality. Multiple liver cysts. Enlarged lymph node within the portal caval region measures 1.5 cm, image 155/3. Defer to report from CT AP dated 10/30/2019 for and same-day MRI of the abdomen pelvis for further details. Musculoskeletal: Mild curvature of the thoracic spine and lumbar spine is convex towards the left. Multilevel degenerative disc disease. No acute or suspicious osseous lesions. IMPRESSION: 1. No signs of metastatic disease to the chest. 2. Areas of subsegmental atelectasis noted within bilateral posterior lung bases and lingula. 3. Enlarged upper abdominal lymph node as above. In the setting of a known malignancy this is concerning for nodal metastasis. 4. Coronary artery calcifications. 5.  Aortic Atherosclerosis (ICD10-I70.0). Electronically Signed   By: Signa Kell M.D.   On: 10/31/2023 05:57   CT ABDOMEN PELVIS W CONTRAST  Result Date: 10/30/2023 CLINICAL DATA:  Abdominal pain. EXAM: CT ABDOMEN AND PELVIS WITH CONTRAST TECHNIQUE: Multidetector CT imaging of the abdomen and pelvis was performed using the standard protocol following bolus administration of intravenous contrast. RADIATION  DOSE REDUCTION: This exam was performed according to the departmental dose-optimization program which includes automated exposure control, adjustment of the mA and/or kV according to patient size and/or use of iterative reconstruction technique. CONTRAST:  75mL OMNIPAQUE IOHEXOL 350 MG/ML SOLN COMPARISON:  Limb 1424 FINDINGS: Lower chest: No acute findings. Hepatobiliary: Multiple hepatic cysts evident. Scattered tiny hypodensities in the liver parenchyma are too small to characterize but are statistically most likely benign. No followup imaging is recommended. Tiny subcapsular lesion measured previously at 9 mm in the anterior left liver is stable on image 23/3 today, nonspecific. There is no evidence for gallstones, gallbladder wall thickening, or pericholecystic fluid. No intrahepatic or extrahepatic biliary dilation. Pancreas: Dilatation of the pancreatic duct to the head and body of pancreas is similar to prior. Spleen: No splenomegaly. No suspicious focal mass lesion. Adrenals/Urinary Tract: No adrenal nodule or mass. Kidneys unremarkable. No evidence for hydroureter. Bladder is distended. Stomach/Bowel: Stomach is unremarkable. No gastric wall thickening. No evidence of outlet obstruction. Duodenum is normally positioned as is the ligament of Treitz. No small bowel wall thickening. No small bowel dilatation. Diverticular changes are noted in the left colon without evidence of diverticulitis. Vascular/Lymphatic: 16 mm short axis portal caval lymph node seen on 22/3. No para-aortic lymphadenopathy. No pelvic sidewall lymphadenopathy. Reproductive: Multiple uterine fibroids evident. As noted on prior study there is an area of the anterior cervix that appears to obliterate the fat plane between the cervix and the posterior wall of the bladder (see sagittal 86/7). Small soft tissue nodules are again noted in the cul-de-sac some of which may pertain to diverticuli, but others raise concern for peritoneal nodularity  (see images 61 and 56 of series  3). Other: No substantial free fluid. Musculoskeletal: No worrisome lytic or sclerotic osseous abnormality. IMPRESSION: 1. Multiple uterine fibroids. As noted on prior study there is an area of the anterior cervix that appears to obliterate the fat plane between the cervix and the posterior wall of the bladder. This is concerning for a cervical mass. Gynecologic consultation recommended. 2. Small soft tissue nodules in the cul-de-sac some of which may relate to diverticuli, but others raise concern for peritoneal nodularity. Attention on follow-up recommended. PET-CT may prove helpful to further evaluate 3. 16 mm short axis portal caval lymph node, metastatic disease not excluded. 4. Left colonic diverticulosis without diverticulitis. Electronically Signed   By: Kennith Center M.D.   On: 10/30/2023 13:08   CT ABDOMEN PELVIS W CONTRAST  Result Date: 10/26/2023 CLINICAL DATA:  Pt w/ abnormal [place]; mass in pelvis; no h/o cancer; no pain; no urinary issues EXAM: CT ABDOMEN AND PELVIS WITH CONTRAST TECHNIQUE: Multidetector CT imaging of the abdomen and pelvis was performed using the standard protocol following bolus administration of intravenous contrast. RADIATION DOSE REDUCTION: This exam was performed according to the departmental dose-optimization program which includes automated exposure control, adjustment of the mA and/or kV according to patient size and/or use of iterative reconstruction technique. CONTRAST:  ISOVUE-300 IOPAMIDOL (ISOVUE-300) INJECTION 61% COMPARISON:  Ultrasound pelvis 09/01/2023, ultrasound thyroid 10/10/2023 FINDINGS: Lower chest: Mitral annular calcification. Aortic valve leaflet calcification. No acute abnormality. Hepatobiliary: Multiple fluid density lesions scattered throughout the left right hepatic lobe. There an indeterminate 0.9 cm left hepatic lobe hypodensity with Hounsfield unit of 77 (2:29). No gallstones, gallbladder wall thickening, or  pericholecystic fluid. No biliary dilatation. Pancreas: No focal lesion. Normal pancreatic contour. No surrounding inflammatory changes. No main pancreatic ductal dilatation. Spleen: Normal in size without focal abnormality. Adrenals/Urinary Tract: No adrenal nodule bilaterally. Bilateral kidneys enhance symmetrically. No hydronephrosis. No hydroureter. The urinary bladder is unremarkable. On delayed imaging, there is no urothelial wall thickening and there are no filling defects in the opacified portions of the bilateral collecting systems or ureters. Stomach/Bowel: Stomach is within normal limits. No evidence of small bowel wall thickening or dilatation. Increased stool burden proximal to the distal sigmoid colon mass with stool throughout the ascending, transverse, descending colon. Short segment of distal sigmoid colon irregular bowel wall thickening (5:26, 2:62). No large bowel luminal dilatation. Colonic diverticulosis appendix appears normal. Vascular/Lymphatic: No abdominal aorta or iliac aneurysm. 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 No abdominal, pelvic, or inguinal lymphadenopathy. Reproductive: There is a heterogeneous solid and cystic 11 x 9 cm mass arising from the uterine fundus. Finding is noted to invade into the urinary bladder dome (6:81, 5:56 close) where there is loss of intraperitoneal and lower ring of the urinary bladder wall margin. The mass is noted to abut and appears to be inseparable from a short segment of distal sigmoid colon in the region of irregular bowel wall thickening. Other: No intraperitoneal free fluid. No intraperitoneal free gas. No organized fluid collection. Musculoskeletal: No abdominal wall hernia or abnormality. No suspicious lytic or blastic osseous lesions. No acute displaced fracture. Multilevel severe degenerative changes of the spine. Grade 1 anterolisthesis of L4 on L5 and L5 on S1.  Mild retrolisthesis of L2 on L3 and L3 on L4. Dextroscoliosis centered at the L3-L4 level. IMPRESSION: 1. An 11 x 9 cm heterogeneous solid and cystic mass arises from the uterine fundus and is noted to invade the  urinary bladder dome wall as well as the distal sigmoid colon. No associated bowel obstruction; however, constipation proximal to irregular bowel wall thickening/mass. No associated stercoral colitis. Finding consistent with malignancy. Recommend gynecologic consultation. When the patient is clinically stable and able to follow directions and hold their breath (preferably as an outpatient) further evaluation with dedicated MRI with and without contrast should be considered. 2. Indeterminate 0.9 cm left hepatic lobe hypodense lesion with a density of 77 HU. Question metastasis versus primary hepatic lesion. 3. Stool throughout the colon 4. Colonic diverticulosis with no acute diverticulitis. 5. 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 6.  Aortic Atherosclerosis (ICD10-I70.0)-severe. These results will be called to the ordering clinician or representative by the Radiologist Assistant, and communication documented in the PACS or [employer]. Electronically Signed   By: Tish Frederickson M.D.   On: 10/26/2023 18:42   DG BONE DENSITY (DXA)  Result Date: 10/26/2023 EXAM: DUAL X-RAY ABSORPTIOMETRY (DXA) FOR BONE MINERAL DENSITY IMPRESSION: Referring Physician:  Henrine Screws Your patient completed a bone mineral density test using GE Lunar iDXA system (analysis version: 16). Technologist: BEC PATIENT: Name: Antonela, Piland Patient ID: [id number] Birth Date: Nov 24, 1946 Height: 60.5 in. Sex: Female Measured: 10/26/2023 Weight: 110.2 lbs. Indications: Advanced Age, Caucasian, Estrogen Deficient, Height Loss (781.91), History of Osteoporosis, Levothyroxine, Postmenopausal Fractures: Left Ankle Treatments: Calcium (E943.0), Vitamin D (E933.5) ASSESSMENT: The BMD measured at Forearm Radius 33% is 0.624  g/cm2 with a T-score of -2.9. This patient's diagnostic category is OSTEOPOROSIS according to World Health Organization [facility]) criteria. Comparison to 10/16/2019. Since the prior study, there has been a SIGNIFICANT DECREASE in bone mineral density of the hips (-4.6%). The lumbar spine was excluded due to being excluded from prior exam. The quality of the exam is good. Site Region Measured Date Measured Age YA BMD Significant CHANGE T-score Right Forearm Radius 33% 10/26/2023 78.0 -2.9 0.624 g/cm2 Right Forearm Radius 33% 10/16/2019 74.0 -2.6 0.653 g/cm2 DualFemur Neck Left 10/26/2023 78.0 -0.6 0.952 g/cm2 DualFemur Neck Left 10/16/2019 74.0 -0.5 0.962 g/cm2 DualFemur Total Mean 10/26/2023 78.0 -0.7 0.918 g/cm2 * DualFemur Total Mean 10/16/2019 74.0 -0.4 0.962 g/cm2 World Health Organization [facility]) criteria for post-menopausal, Caucasian Women: Normal       T-score at or above -1 SD Osteopenia   T-score between -1 and -2.5 SD Osteoporosis T-score at or below -2.5 SD RECOMMENDATION: 1. All patients should optimize calcium and vitamin D intake. 2. Consider FDA-approved medical therapies in postmenopausal women and men aged 28 years and older, based on the following: a. A hip or vertebral (clinical or morphometric) fracture. b. T-score = -2.5 at the femoral neck or spine after appropriate evaluation to exclude secondary causes. c. Low bone mass (T-score between -1.0 and -2.5 at the femoral neck or spine) and a 10-year probability of a hip fracture = 3% or a 10-year probability of a major osteoporosis-related fracture = 20% based on the [place]-adapted WHO algorithm. d. Clinician judgment and/or patient preferences may indicate treatment for people with 10-year fracture probabilities above or below these levels. FOLLOW-UP: Patients with diagnosis of osteoporosis or at high risk for fracture should have regular bone mineral density tests.? Patients eligible for Medicare are allowed routine testing every 2 years.? The testing  frequency can be increased to one year for patients who have rapidly progressing disease, are receiving or discontinuing medical therapy to restore bone mass, or have additional risk factors. I have reviewed this study and agree with the findings. [facility] Electronically  Signed   By: Harmon Pier M.D.   On: 10/26/2023 12:30      11/08/2023 Pathology Results   SURGICAL PATHOLOGY CASE: [id number] PATIENT: [facility] Surgical Pathology Report  Clinical History: Pelvic mass, suspected malignancy (crm)   FINAL MICROSCOPIC DIAGNOSIS:  A. PELVIC SIDEWALL NODULE, RIGHT, EXCISION: - High-grade serous carcinoma, see comment  B. ABDOMINAL WALL #1, ANTERIOR, EXCISION: - High-grade serous carcinoma  COMMENT:  A.  Immunohistochemical stain show that the tumor cells are positive for CK7, PAX8, and p16 (diffuse overexpression).  Immunostain for p53 shows a clonal null expression pattern.  Immunostains for CK20 is negative. This immunoprofile is consistent with the above interpretation and suggestive of an ovarian primary.    11/14/2023 Initial Diagnosis   Malignant ovarian neoplasm, right (HCC)   11/14/2023 Cancer Staging   Staging form: Ovary, Fallopian Tube, and Primary Peritoneal Carcinoma, AJCC 8th Edition - Clinical stage from 11/14/2023: FIGO Stage IIIC (cT3c, cN1, cM0) - Signed by Artis Delay, MD on 11/14/2023 Stage prefix: Initial diagnosis   11/20/2023 Tumor Marker   Patient's tumor was tested for the following markers: CA-125. Results of the tumor marker test revealed 169.   11/22/2023 -  Chemotherapy   Patient is on Treatment Plan : OVARIAN Carboplatin (AUC 6) + Paclitaxel (175) q21d X 6 Cycles         Interval history-: Discussed the use of AI scribe software for clinical note transcription with the patient, who gave verbal consent to proceed.   Lori Conrad is a 77 y.o. female with oncologic history as above presenting to [facility] today with chief  complaint of abdominal pain.  Patient is accompanied by her son who provides additional history.  Patient is reporting acute right lower quadrant abdominal pain that started x 2 days ago. The pain was described as sharp, constant, and severe, rating it as a seven or eight on a scale of one to ten. The patient reported a sensation of something 'rolling around' in the abdomen, and a new level of bloating was noted. The patient also reported a history of mild constipation, which has worsened since the cancer diagnosis.  The patient had been hospitalized around Thanksgiving with a similar but more extreme episode of abdominal pain. During that admission, it was discovered that the patient's tumor, located near the right ovary, had twisted, causing significant discomfort. The patient was prescribed hydrocodone for pain management during that time, which she had been taking for the current episode as well. Her last dose os hydrocodone was at 10am this morning.  The patient also reported difficulty eating and drinking due to the abdominal discomfort.  The patient also reported a change in bowel movements, describing them as watery for the past three days. Despite aggressive use of laxatives including lactulose, Senna, and Miralax, the patient did not feel like the bowel was clearing. Her last bowel movement was this morning. Edmon Crape is still watery. She denies any fever.    ROS  All other systems are reviewed and are negative for acute change except as noted in the HPI.    No Known Allergies   Past Medical History:  Diagnosis Date   Arthritis    Dilated aortic root (HCC)    Hyperlipidemia    Hypothyroidism    Macular degeneration      Past Surgical History:  Procedure Laterality Date   ANKLE FRACTURE SURGERY Left    BROW LIFT Bilateral 05/17/2022   Procedure: BILATERAL UPPER BLEPHAROPLASTY WITH PTOSIS REPAIR;  Surgeon:  Janne Napoleon, MD;  Location: [facility] OR;  Service: Plastics;  Laterality:  Bilateral;  1.5 hours   BUNIONECTOMY Left    COLONOSCOPY     EYE SURGERY     FLEXIBLE SIGMOIDOSCOPY N/A 11/02/2023   Procedure: FLEXIBLE SIGMOIDOSCOPY;  Surgeon: Kerin Salen, MD;  Location: [facility] ENDOSCOPY;  Service: Gastroenterology;  Laterality: N/A;   IR IMAGING GUIDED PORT INSERTION  11/01/2023   LAPAROSCOPY N/A 11/08/2023   Procedure: LAPAROSCOPY DIAGNOSTIC WITH BIOPSY;  Surgeon: Carver Fila, MD;  Location: [facility] ORS;  Service: Gynecology;  Laterality: N/A;   REFRACTIVE SURGERY     laser for macular degeneration   SHOULDER ARTHROSCOPY     TONSILLECTOMY      Social History   Socioeconomic History   Marital status: Widowed    Spouse name: Not on file   Number of children: 2   Years of education: Not on file   Highest education level: Not on file  Occupational History   Not on file  Tobacco Use   Smoking status: Former    Current packs/day: 0.00    Types: Cigarettes    Quit date: 05/15/1988    Years since quitting: 35.5   Smokeless tobacco: Never  Vaping Use   Vaping status: Never Used  Substance and Sexual Activity   Alcohol use: Yes    Alcohol/week: 14.0 standard drinks of alcohol    Types: 14 drink(s) per week    Comment: white wine   Drug use: No   Sexual activity: Not Currently    Partners: Male  Other Topics Concern   Not on file  Social History Narrative   Not on file   Social Drivers of Health   Financial Resource Strain: Not on file  Food Insecurity: No Food Insecurity (10/30/2023)   Hunger Vital Sign    Worried About Running Out of Food in the Last Year: Never true    Ran Out of Food in the Last Year: Never true  Transportation Needs: No Transportation Needs (10/30/2023)   PRAPARE - Administrator, Civil Service (Medical): No    Lack of Transportation (Non-Medical): No  Physical Activity: Not on file  Stress: Not on file  Social Connections: Not on file  Intimate Partner Violence: Not At Risk (10/30/2023)   Humiliation, Afraid,  Rape, and Kick questionnaire    Fear of Current or Ex-Partner: No    Emotionally Abused: No    Physically Abused: No    Sexually Abused: No    Family History  Problem Relation Age of Onset   Lung cancer Mother 69 - 45       smoked   Stroke Mother 63   Alzheimer's disease Father    CAD Father        95% occlusion of L Main & RCA, severe descending aorta, arterial & arteriolonephrosclerosis    Cholecystitis Father    Esophagitis Father    Cancer Brother 24 - 24       unknown type, agent orange exposure   Pancreatic cancer Daughter 10   BRCA 1/2 Neg Hx    Breast cancer Neg Hx    Colon cancer Neg Hx    Ovarian cancer Neg Hx    Endometrial cancer Neg Hx    Prostate cancer Neg Hx      Current Facility-Administered Medications:    HYDROmorphone (DILAUDID) injection 0.5 mg, 0.5 mg, Intravenous, Once, Walisiewicz, Amarea Macdowell E, PA-C   ondansetron (ZOFRAN) injection 4 mg, 4 mg, Intravenous, Once, Walisiewicz,  Caroleen Hamman, PA-C  Current Outpatient Medications:    atorvastatin (LIPITOR) 40 MG tablet, Take 40 mg by mouth at bedtime., Disp: , Rfl:    Calcium Carb-Cholecalciferol (SUPER CALCIUM 600 + D 400 PO), Take 1 tablet by mouth daily., Disp: , Rfl:    Cholecalciferol (VITAMIN D) 50 MCG (2000 UT) tablet, Take 2,000 Units by mouth daily., Disp: , Rfl:    Coenzyme Q10 (COQ10) 400 MG CAPS, Take 400 mg by mouth daily., Disp: , Rfl:    dexamethasone (DECADRON) 4 MG tablet, Take 2 tabs at the night before and 2 tab the morning of chemotherapy, every 3 weeks, by mouth x 6 cycles, Disp: 24 tablet, Rfl: 6   doxylamine, Sleep, (UNISOM) 25 MG tablet, Take 25 mg by mouth at bedtime as needed for sleep., Disp: , Rfl:    Glucosamine-Chondroit-Vit C-Mn (GLUCOSAMINE 1500 COMPLEX PO), Take 1 tablet by mouth daily., Disp: , Rfl:    HYDROcodone-acetaminophen (NORCO/VICODIN) 5-325 MG tablet, Take 1-2 tablets by mouth every 4 (four) hours as needed for moderate pain (pain score 4-6) or severe pain (pain score  7-10)., Disp: 30 tablet, Rfl: 0   lactulose (CHRONULAC) 10 GM/15ML solution, Take 10.5 mLs (7 g total) by mouth 3 (three) times daily., Disp: 236 mL, Rfl: 0   levothyroxine (SYNTHROID) 75 MCG tablet, Take 75 mcg by mouth daily before breakfast., Disp: , Rfl:    lidocaine-prilocaine (EMLA) cream, Apply to affected area once, Disp: 30 g, Rfl: 3   Magnesium 250 MG TABS, Take 250 mg by mouth daily., Disp: , Rfl:    meloxicam (MOBIC) 7.5 MG tablet, Take 7.5 mg by mouth daily., Disp: , Rfl:    Multiple Vitamins-Minerals (PRESERVISION AREDS 2+MULTI VIT PO), Take 1 tablet by mouth 2 (two) times daily., Disp: , Rfl:    Omega-3 Fatty Acids (OMEGA 3 500) 500 MG CAPS, Take 500 mg by mouth daily., Disp: , Rfl:    ondansetron (ZOFRAN) 4 MG tablet, Take 1 tablet (4 mg total) by mouth every 8 (eight) hours as needed for nausea or vomiting., Disp: 30 tablet, Rfl: 0   ondansetron (ZOFRAN) 8 MG tablet, Take 1 tablet (8 mg total) by mouth every 8 (eight) hours as needed for nausea or vomiting. Start on the third day after carboplatin., Disp: 30 tablet, Rfl: 1   polyethylene glycol (MIRALAX / GLYCOLAX) 17 g packet, Take 17 g by mouth daily as needed for mild constipation., Disp: 14 each, Rfl: 0   prochlorperazine (COMPAZINE) 10 MG tablet, Take 1 tablet (10 mg total) by mouth every 6 (six) hours as needed for nausea or vomiting., Disp: 30 tablet, Rfl: 1   senna-docusate (SENOKOT-S) 8.6-50 MG tablet, Take 1 tablet by mouth 2 (two) times daily., Disp: 180 tablet, Rfl: 0  PHYSICAL EXAM: ECOG FS:1 - Symptomatic but completely ambulatory    Vitals:   12/07/23 1236  BP: 132/77  Pulse: 81  Resp: 16  Temp: (!) 97.3 F (36.3 C)  TempSrc: Temporal  SpO2: 98%  Weight: 109 lb 14.4 oz (49.9 kg)   Physical Exam Vitals and nursing note reviewed.  Constitutional:      Appearance: She is not ill-appearing or toxic-appearing.  HENT:     Head: Normocephalic.  Eyes:     Conjunctiva/sclera: Conjunctivae normal.   Cardiovascular:     Rate and Rhythm: Normal rate and regular rhythm.     Pulses: Normal pulses.     Heart sounds: Normal heart sounds.  Pulmonary:     Effort:  Pulmonary effort is normal.     Breath sounds: Normal breath sounds.  Abdominal:     General: Bowel sounds are normal. There is distension (mild).     Palpations: There is mass.     Tenderness: There is abdominal tenderness. There is guarding.  Musculoskeletal:     Cervical back: Normal range of motion.  Skin:    General: Skin is warm and dry.  Neurological:     Mental Status: She is alert.        LABORATORY DATA: I have reviewed the data as listed    Latest Ref Rng & Units 12/07/2023    1:24 PM 11/17/2023    8:23 AM 11/07/2023   11:40 AM  CBC  WBC 4.0 - 10.5 K/uL 2.2  5.0  7.0   Hemoglobin 12.0 - 15.0 g/dL 78.2  95.6  21.3   Hematocrit 36.0 - 46.0 % 38.8  43.4  47.8   Platelets 150 - 400 K/uL 201  188  195         Latest Ref Rng & Units 11/17/2023    8:23 AM 11/07/2023   11:40 AM 11/01/2023    5:27 AM  CMP  Glucose 70 - 99 mg/dL 92  97  086   BUN 8 - 23 mg/dL 13  13  12    Creatinine 0.44 - 1.00 mg/dL 5.78  4.69  6.29   Sodium 135 - 145 mmol/L 140  141  138   Potassium 3.5 - 5.1 mmol/L 4.1  4.2  3.8   Chloride 98 - 111 mmol/L 106  98  102   CO2 22 - 32 mmol/L 29  28  29    Calcium 8.9 - 10.3 mg/dL 9.0  9.6  8.9   Total Protein 6.5 - 8.1 g/dL 6.1     Total Bilirubin <1.2 mg/dL 0.5     Alkaline Phos 38 - 126 U/L 62     AST 15 - 41 U/L 20     ALT 0 - 44 U/L 13          RADIOGRAPHIC STUDIES (from last 24 hours if applicable) I have personally reviewed the radiological images as listed and agreed with the findings in the report. DG Abd 2 Views Result Date: 12/07/2023 CLINICAL DATA:  77 year old female with bloating and constipation. EXAM: ABDOMEN - 2 VIEW COMPARISON:  CT Abdomen and Pelvis 10/30/2023. FINDINGS: Upright and supine views at 1214 hours. Non obstructed bowel gas pattern. Below average  volume of retained stool. No pneumoperitoneum. Degenerative lumbar scoliosis. Stable cardiomegaly. Lung bases appear negative. Increased pelvic and lower abdominal density appears compatible with uterine/ovarian masses demonstrated last month. IMPRESSION: Non obstructed bowel gas pattern. Below average volume of retained stool. No pneumoperitoneum. Electronically Signed   By: Odessa Fleming M.D.   On: 12/07/2023 12:27        Visit Diagnosis: 1. Right lower quadrant abdominal pain   2. Malignant ovarian neoplasm, right (HCC)      Orders Placed This Encounter  Procedures   CBC with Differential    Standing Status:   Standing    Number of Occurrences:   1   CMP (Cancer Center only)    All questions were answered. The patient knows to call the clinic with any problems, questions or concerns. No barriers to learning was detected.  A total of more than 40 minutes were spent on this encounter with face-to-face time and non-face-to-face time, including preparing to see the patient, ordering tests and/or medications,  counseling the patient and coordination of care as outlined above.    Thank you for allowing me to participate in the care of this patient.    Shanon Ace, PA-C Department of Hematology/Oncology [facility] [place] at [facility] Phone: [phone]  [phone]    12/07/2023 2:04 PM

## 2023-12-07 NOTE — ED Provider Notes (Signed)
6:37 PM Patient signed out to me by previous ED physician. Pt is a 77 yo female with pmh of right ovarian mass presenting for RLQ abdominal pain and diarrhea. Sent in by [facility] for CT abd/pelvis.   CT abd/pelvis pending. Physical Exam  BP 135/78   Pulse 86   Temp 99 F (37.2 C)   Resp 15   Ht 5\' 1"  (1.549 m)   Wt 49.9 kg   SpO2 93%   BMI 20.77 kg/m   Physical Exam  Procedures  Procedures  ED Course / MDM   Clinical Course as of 12/07/23 1837  Thu Dec 07, 2023  [id number] [AG]    Clinical Course User Index [AG] Franne Forts, DO   Medical Decision Making Amount and/or Complexity of Data Reviewed Labs: ordered. Radiology: ordered.  Risk Prescription drug management.   CT abdomen and pelvis demonstrates increased size of right ovarian mass.  I spoke with gyn/onc states sometimes this can be normal in the mass dies after chemotherapy.  Also can be resulting in patient's increased pain.  Patient also has some inflammation of the ascending colon in that region.  Possibly colitis.  Patient is currently taking MiraLAX, Senokot, and lactulose several times a day.  This was given to her for severe constipation.  She has not had constipation for a week and has been having regular bowel movements.  I do recommend that she packs off of the lactulose and possibly even the Senokot since she has been constipation free for the last week.  I do recommend she continues taking the MiraLAX daily and increase water intake to help prevent constipation.  Overall concern is that possibly she has been overtreating herself with these medications when there is no constipation.  She admits to several bowel movements a day which could be possibly causing the colon wall thickening that were seen on the CT scan.  It also increases her risk of dehydration.  I will prescribe antibiotics to the patient for possible colitis however recommend that she takes them in 1 to 2 days if symptoms are still present  after backing off of the diuretics.  She was given the option to be admitted for pain control however has requested to be sent home at this time.  She has a home prescription for oxycodone 5-325 mg that she was recommended to take 1 to 2 pills every 4 hours for pain.  She has not taken any of this medication.  She has the bottle with her today and has approximately 10 to 15 pills left.  No need for new prescriptions at this time.  Patient in no distress and overall condition improved here in the ED. Detailed discussions were had with the patient regarding current findings, and need for close f/u with PCP or on call doctor. The patient has been instructed to return immediately if the symptoms worsen in any way for re-evaluation. Patient verbalized understanding and is in agreement with current care plan. All questions answered prior to discharge.        Franne Forts, DO 12/07/23 [phone]

## 2023-12-07 NOTE — ED Provider Notes (Signed)
[place] EMERGENCY DEPARTMENT AT [facility] Provider Note   CSN: [id number] Arrival date & time: 12/07/23  1354     History  Chief Complaint  Patient presents with   Abdominal Pain    Lori Conrad is a 77 y.o. female.  77 yo F with a cc of right lower quadrant abdominal pain and diarrhea.  Going on for a couple days.  She has a history of a right sided ovarian tumor and saw her oncology clinic this morning.  They were concerned and sent her here for CT imaging.  She denies fevers denies dark or bloody stool.  Denies urinary symptoms.   Abdominal Pain      Home Medications Prior to Admission medications   Medication Sig Start Date End Date Taking? Authorizing Provider  atorvastatin (LIPITOR) 40 MG tablet Take 40 mg by mouth at bedtime.    [provider]  Calcium Carb-Cholecalciferol (SUPER CALCIUM 600 + D 400 PO) Take 1 tablet by mouth daily.    [provider]  Cholecalciferol (VITAMIN D) 50 MCG (2000 UT) tablet Take 2,000 Units by mouth daily.    [provider]  Coenzyme Q10 (COQ10) 400 MG CAPS Take 400 mg by mouth daily.    [provider]  dexamethasone (DECADRON) 4 MG tablet Take 2 tabs at the night before and 2 tab the morning of chemotherapy, every 3 weeks, by mouth x 6 cycles 11/14/23   Artis Delay, MD  doxylamine, Sleep, (UNISOM) 25 MG tablet Take 25 mg by mouth at bedtime as needed for sleep.    [provider]  Glucosamine-Chondroit-Vit C-Mn (GLUCOSAMINE 1500 COMPLEX PO) Take 1 tablet by mouth daily.    [provider]  HYDROcodone-acetaminophen (NORCO/VICODIN) 5-325 MG tablet Take 1-2 tablets by mouth every 4 (four) hours as needed for moderate pain (pain score 4-6) or severe pain (pain score 7-10). 11/27/23 11/26/24  Artis Delay, MD  lactulose (CHRONULAC) 10 GM/15ML solution Take 10.5 mLs (7 g total) by mouth 3 (three) times daily. 11/28/23   Artis Delay, MD  levothyroxine (SYNTHROID) 75 MCG  tablet Take 75 mcg by mouth daily before breakfast.    [provider]  lidocaine-prilocaine (EMLA) cream Apply to affected area once 11/14/23   Artis Delay, MD  Magnesium 250 MG TABS Take 250 mg by mouth daily.    [provider]  meloxicam (MOBIC) 7.5 MG tablet Take 7.5 mg by mouth daily. 07/28/21   [provider]  Multiple Vitamins-Minerals (PRESERVISION AREDS 2+MULTI VIT PO) Take 1 tablet by mouth 2 (two) times daily. 11/12/19   [provider]  Omega-3 Fatty Acids (OMEGA 3 500) 500 MG CAPS Take 500 mg by mouth daily.    [provider]  ondansetron (ZOFRAN) 4 MG tablet Take 1 tablet (4 mg total) by mouth every 8 (eight) hours as needed for nausea or vomiting. 11/03/23 11/02/24  [place], Eric J, DO  ondansetron (ZOFRAN) 8 MG tablet Take 1 tablet (8 mg total) by mouth every 8 (eight) hours as needed for nausea or vomiting. Start on the third day after carboplatin. 11/14/23   Artis Delay, MD  polyethylene glycol (MIRALAX / GLYCOLAX) 17 g packet Take 17 g by mouth daily as needed for mild constipation. 11/03/23   [place], Alvira Philips, DO  prochlorperazine (COMPAZINE) 10 MG tablet Take 1 tablet (10 mg total) by mouth every 6 (six) hours as needed for nausea or vomiting. 11/14/23   Artis Delay, MD  senna-docusate (SENOKOT-S) 8.6-50  MG tablet Take 1 tablet by mouth 2 (two) times daily. 11/03/23 02/01/24  [place], Eric J, DO      Allergies    Patient has no known allergies.    Review of Systems   Review of Systems  Gastrointestinal:  Positive for abdominal pain.    Physical Exam Updated Vital Signs BP 132/79   Pulse 81   Temp (!) 97.1 F (36.2 C) (Oral)   Resp 18   Ht 5\' 1"  (1.549 m)   Wt 49.9 kg   SpO2 98%   BMI 20.77 kg/m  Physical Exam Vitals and nursing note reviewed.  Constitutional:      General: She is not in acute distress.    Appearance: She is well-developed. She is not diaphoretic.  HENT:     Head: Normocephalic and atraumatic.  Eyes:      Pupils: Pupils are equal, round, and reactive to light.  Cardiovascular:     Rate and Rhythm: Normal rate and regular rhythm.     Heart sounds: No murmur heard.    No friction rub. No gallop.  Pulmonary:     Effort: Pulmonary effort is normal.     Breath sounds: No wheezing or rales.  Abdominal:     General: There is no distension.     Palpations: Abdomen is soft.     Tenderness: There is abdominal tenderness.     Comments: RLQ pain  Musculoskeletal:        General: No tenderness.     Cervical back: Normal range of motion and neck supple.  Skin:    General: Skin is warm and dry.  Neurological:     Mental Status: She is alert and oriented to person, place, and time.  Psychiatric:        Behavior: Behavior normal.     ED Results / Procedures / Treatments   Labs (all labs ordered are listed, but only abnormal results are displayed) Labs Reviewed  MAGNESIUM    EKG None  Radiology DG Abd 2 Views Result Date: 12/07/2023 CLINICAL DATA:  77 year old female with bloating and constipation. EXAM: ABDOMEN - 2 VIEW COMPARISON:  CT Abdomen and Pelvis 10/30/2023. FINDINGS: Upright and supine views at 1214 hours. Non obstructed bowel gas pattern. Below average volume of retained stool. No pneumoperitoneum. Degenerative lumbar scoliosis. Stable cardiomegaly. Lung bases appear negative. Increased pelvic and lower abdominal density appears compatible with uterine/ovarian masses demonstrated last month. IMPRESSION: Non obstructed bowel gas pattern. Below average volume of retained stool. No pneumoperitoneum. Electronically Signed   By: Odessa Fleming M.D.   On: 12/07/2023 12:27    Procedures Procedures    Medications Ordered in ED Medications  HYDROmorphone (DILAUDID) injection 0.5 mg (0.5 mg Intravenous Given 12/07/23 1545)  ondansetron (ZOFRAN) injection 4 mg (4 mg Intravenous Given 12/07/23 1544)  sodium chloride 0.9 % bolus 1,000 mL (1,000 mLs Intravenous New Bag/Given 12/07/23 1543)   iohexol (OMNIPAQUE) 300 MG/ML solution 100 mL (100 mLs Intravenous Contrast Given 12/07/23 1520)    ED Course/ Medical Decision Making/ A&P                                 Medical Decision Making Amount and/or Complexity of Data Reviewed Labs: ordered. Radiology: ordered.  Risk Prescription drug management.   77 yo F with a cc of right-sided abdominal pain.  Patient has a known right ovarian mass.  She was seen in oncology clinic today  and they were concerned about her symptoms.  She said going on for about 24 to 48 hours.  Having diarrhea.  Will obtain CT imaging.  I reviewed the lab work from the patient's oncology visit, no acute anemia, no significant electrolyte abnormalities.  Renal function appears to be at baseline.  Signed out to Dr. Wallace Cullens, please see their note for further details of care in the ED.  The patients results and plan were reviewed and discussed.   Any x-rays performed were independently reviewed by myself.   Differential diagnosis were considered with the presenting HPI.  Medications  HYDROmorphone (DILAUDID) injection 0.5 mg (0.5 mg Intravenous Given 12/07/23 1545)  ondansetron (ZOFRAN) injection 4 mg (4 mg Intravenous Given 12/07/23 1544)  sodium chloride 0.9 % bolus 1,000 mL (1,000 mLs Intravenous New Bag/Given 12/07/23 1543)  iohexol (OMNIPAQUE) 300 MG/ML solution 100 mL (100 mLs Intravenous Contrast Given 12/07/23 1520)    Vitals:   12/07/23 1402  BP: 132/79  Pulse: 81  Resp: 18  Temp: (!) 97.1 F (36.2 C)  TempSrc: Oral  SpO2: 98%  Weight: 49.9 kg  Height: 5\' 1"  (1.549 m)    Final diagnoses:  RLQ abdominal pain  Diarrhea, unspecified type          Final Clinical Impression(s) / ED Diagnoses Final diagnoses:  RLQ abdominal pain  Diarrhea, unspecified type    Rx / DC Orders ED Discharge Orders     None         Melene Plan, DO 12/07/23 1627

## 2023-12-07 NOTE — ED Triage Notes (Signed)
Patient sent from the cancer center. Reports RLQ pain x 2 days. Patient has ovarian cancer and hx of ovarian torsion. Patient denies nausea, vomiting, diarrhea, and constipation.

## 2023-12-07 NOTE — Telephone Encounter (Signed)
This RN returned call to pt per her VM stating concerns for ongoing unrelieved constipation.  She states last VM was late pm yesterday with consistency being "liquidy".  Last formed stool was 4 days ago.  She states she is using the lactalose , stool softners and drinking very well.  Stomach is bloated and tender touch with intermittent waves of pain.  Pt states history of bowel blockage with above symptoms.  Above reported to Symptom Management provider for recommendations.

## 2023-12-07 NOTE — Discharge Instructions (Addendum)
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  Your Gyn/Onc team has stated that sometimes these masses get larger while they are dying.  This can be resulting in your increased pain.  You have a pain medication called oxycodone-acetaminophen 5-325 mg.  Your prescription is to take 1 to 2 pills every 4 hours as needed for pain.  If your pain is uncontrolled with this medication please return to emergency department for admission for pain control.  Your CAT scan also demonstrated possible colitis.  This is a wall thickening of the colon.  This can be from inflammation versus infection.  My overall thought processes that you are taking too many medications to help you have bowel movements and that is why you are having diarrhea.  Stop taking the lactulose.  Use the Senokot only as needed for constipation.  Take the MiraLAX daily.   I would wait 1 to 2 days before taking the antibiotics for colitis. Then only take if your abdominal pain is still severe.  My reasoning is, I believe the colon thickening is likely secondary to overuse of constipation relieving medications. This antibiotic can also worsen diarrhea.

## 2023-12-08 ENCOUNTER — Ambulatory Visit: Payer: Self-pay | Admitting: Cardiology

## 2023-12-08 ENCOUNTER — Telehealth: Payer: Self-pay | Admitting: *Deleted

## 2023-12-08 ENCOUNTER — Ambulatory Visit: Payer: Medicare HMO | Admitting: Internal Medicine

## 2023-12-08 ENCOUNTER — Other Ambulatory Visit: Payer: Self-pay

## 2023-12-08 NOTE — Telephone Encounter (Signed)
Spoke with Ms. Hoffner this morning. Pt states she forgot to take a pain pill last night and woke up early am with right lower quadrant pain 10/10. Pt states she called her son and then she took a hydrocodone and went back to sleep and she is rating her pain now 2/10, pt states she feels better this morning. Pt states she doesn't like how it makes her feel, and she prefers taking tylenol during the day and she will take the hydrocodone before bed.  Pt states she is having regular bowel movements and the plan from ED physician is to only take Miralax once daily and stop taking senokot and if her symptoms persist then she can take the antibiotics are prescribed.   Pt thanked the office for calling and advised to reach out with any concerns or needs.

## 2023-12-10 DIAGNOSIS — Z111 Encounter for screening for respiratory tuberculosis: Secondary | ICD-10-CM | POA: Diagnosis not present

## 2023-12-12 ENCOUNTER — Ambulatory Visit: Payer: Medicare HMO | Admitting: Cardiology

## 2023-12-12 ENCOUNTER — Inpatient Hospital Stay: Payer: Medicare HMO | Admitting: Licensed Clinical Social Worker

## 2023-12-12 DIAGNOSIS — Z0279 Encounter for issue of other medical certificate: Secondary | ICD-10-CM | POA: Diagnosis not present
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
## 2023-12-14 ENCOUNTER — Encounter: Payer: Self-pay | Admitting: Hematology and Oncology

## 2023-12-14 ENCOUNTER — Inpatient Hospital Stay: Payer: Medicare HMO

## 2023-12-14 ENCOUNTER — Inpatient Hospital Stay: Payer: Medicare HMO | Attending: Psychiatry | Admitting: Hematology and Oncology

## 2023-12-14 VITALS — BP 125/83 | HR 97 | Temp 98.2°F | Resp 18 | Ht 61.0 in | Wt 109.8 lb

## 2023-12-14 DIAGNOSIS — D701 Agranulocytosis secondary to cancer chemotherapy: Secondary | ICD-10-CM | POA: Diagnosis not present

## 2023-12-14 DIAGNOSIS — F419 Anxiety disorder, unspecified: Secondary | ICD-10-CM | POA: Insufficient documentation

## 2023-12-14 DIAGNOSIS — K5909 Other constipation: Secondary | ICD-10-CM | POA: Insufficient documentation

## 2023-12-14 DIAGNOSIS — C561 Malignant neoplasm of right ovary: Secondary | ICD-10-CM

## 2023-12-14 DIAGNOSIS — R5383 Other fatigue: Secondary | ICD-10-CM | POA: Diagnosis not present

## 2023-12-14 DIAGNOSIS — Z5111 Encounter for antineoplastic chemotherapy: Secondary | ICD-10-CM | POA: Diagnosis not present

## 2023-12-14 LAB — CMP (CANCER CENTER ONLY)
ALT: 11 U/L (ref 0–44)
AST: 19 U/L (ref 15–41)
Albumin: 3.6 g/dL (ref 3.5–5.0)
Alkaline Phosphatase: 72 U/L (ref 38–126)
Anion gap: 8 (ref 5–15)
BUN: 15 mg/dL (ref 8–23)
CO2: 30 mmol/L (ref 22–32)
Calcium: 9.5 mg/dL (ref 8.9–10.3)
Chloride: 100 mmol/L (ref 98–111)
Creatinine: 0.55 mg/dL (ref 0.44–1.00)
GFR, Estimated: >60 mL/min (ref >60)
Glucose, Bld: 132 mg/dL — ABNORMAL HIGH (ref 70–99)
Potassium: 3.8 mmol/L (ref 3.5–5.1)
Sodium: 138 mmol/L (ref 135–145)
Total Bilirubin: 0.5 mg/dL (ref 0.0–1.2)
Total Protein: 6.8 g/dL (ref 6.5–8.1)

## 2023-12-14 LAB — CBC WITH DIFFERENTIAL (CANCER CENTER ONLY)
Abs Immature Granulocytes: 0.31 10*3/uL — ABNORMAL HIGH (ref 0.00–0.07)
Basophils Absolute: 0.1 10*3/uL (ref 0.0–0.1)
Basophils Relative: 1 %
Eosinophils Absolute: 0.1 10*3/uL (ref 0.0–0.5)
Eosinophils Relative: 1 %
HCT: 38.3 % (ref 36.0–46.0)
Hemoglobin: 12.6 g/dL (ref 12.0–15.0)
Immature Granulocytes: 3 %
Lymphocytes Relative: 8 %
Lymphs Abs: 0.9 10*3/uL (ref 0.7–4.0)
MCH: 29.3 pg (ref 26.0–34.0)
MCHC: 32.9 g/dL (ref 30.0–36.0)
MCV: 89.1 fL (ref 80.0–100.0)
Monocytes Absolute: 0.8 10*3/uL (ref 0.1–1.0)
Monocytes Relative: 8 %
Neutro Abs: 8.4 10*3/uL — ABNORMAL HIGH (ref 1.7–7.7)
Neutrophils Relative %: 79 %
Platelet Count: 200 10*3/uL (ref 150–400)
RBC: 4.3 MIL/uL (ref 3.87–5.11)
RDW: 14.1 % (ref 11.5–15.5)
WBC Count: 10.5 10*3/uL (ref 4.0–10.5)
nRBC: 0 % (ref 0.0–0.2)

## 2023-12-14 MED ORDER — HEPARIN SOD (PORK) LOCK FLUSH 100 UNIT/ML IV SOLN
500.0000 [IU] | Freq: Once | INTRAVENOUS | Status: AC
Start: 2023-12-14 — End: 2023-12-14
  Administered 2023-12-14: 500 [IU]

## 2023-12-14 MED ORDER — SODIUM CHLORIDE 0.9% FLUSH
10.0000 mL | Freq: Once | INTRAVENOUS | Status: AC
Start: 2023-12-14 — End: 2023-12-14
  Administered 2023-12-14: 10 mL

## 2023-12-14 NOTE — Assessment & Plan Note (Signed)
 Due to side effects of treatment and interruption of sleep We discussed strategies including low-dose steroids for few days after treatment

## 2023-12-14 NOTE — Progress Notes (Signed)
 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  There was almost 3-1/2 weeks between the first imaging study and the start date of her treatment The plan will be to proceed with chemotherapy as scheduled I plan to repeat imaging study after cycle 3 of therapy We discussed various strategies to help minimize risk of toxicities moving forward  Other constipation She had history of severe constipation with treatment We discussed twice daily MiraLAX  and Senokot for 3 days after treatment  Mild anxiety We discussed various strategies to cope with anxiety associated with her treatment  Other fatigue Due to side effects of treatment and interruption of sleep We discussed strategies including low-dose steroids for few days after treatment  No orders of the defined types were placed in this encounter.   All questions were answered. The patient knows to call the clinic with any problems, questions or concerns. The total time spent in the appointment was 40 minutes encounter with patients including review of chart and various tests results, discussions about plan of care and coordination of care plan   [name], MD 12/14/2023 3:25 PM  INTERVAL HISTORY: Please see below for problem oriented charting. she returns for chemotherapy follow-up with her son The patient is currently residing in [place] [facility] We discussed various different side effects she experienced with the last cycle of treatment We discussed different laxative regimen to prevent risk of severe constipation I spent some time reviewing the imaging studies with her and her son We discussed strategies to help her  cope with anxiety So far, she denies nausea or neuropathy Her bowel habits are back to normal  REVIEW OF SYSTEMS:   Constitutional: Denies fevers, chills or abnormal weight loss Eyes: Denies blurriness of vision Ears, nose, mouth, throat, and face: Denies mucositis or sore throat Respiratory: Denies cough, dyspnea or wheezes Cardiovascular: Denies palpitation, chest discomfort or lower extremity swelling Skin: Denies abnormal skin rashes Lymphatics: Denies new lymphadenopathy or easy bruising Neurological:Denies numbness, tingling or new weaknesses All other systems were reviewed with the patient and are negative.  I have reviewed the past medical history, past surgical history, social history and family history with the patient and they are unchanged from previous note.  ALLERGIES:  has no known allergies.  MEDICATIONS:  Current Outpatient Medications  Medication Sig Dispense Refill   atorvastatin  (LIPITOR) 40 MG tablet Take 40 mg by mouth at bedtime.     Calcium  Carb-Cholecalciferol (SUPER CALCIUM  600 + D 400 PO) Take 1 tablet by mouth daily.     Cholecalciferol (VITAMIN D) 50 MCG (2000 UT) tablet Take 2,000 Units by mouth daily.     Coenzyme Q10 (COQ10) 400 MG CAPS Take 400 mg by mouth daily.     dexamethasone  (DECADRON ) 4 MG tablet Take 2 tabs at the night before and 2 tab the morning of chemotherapy, every 3 weeks, by mouth x 6 cycles 24 tablet 6   doxylamine , Sleep, (UNISOM ) 25 MG tablet Take 25 mg by mouth at bedtime as needed for sleep.     Glucosamine-Chondroit-Vit C-Mn (GLUCOSAMINE 1500 COMPLEX PO) Take 1 tablet by mouth daily.     HYDROcodone -acetaminophen  (NORCO/VICODIN) 5-325 MG tablet Take 1-2 tablets by mouth every  4 (four) hours as needed for moderate pain (pain score 4-6) or severe pain (pain score 7-10). 30 tablet 0   lactulose  (CHRONULAC ) 10 GM/15ML solution Take 10.5 mLs (7 g total) by mouth 3 (three) times daily. 236 mL 0   levothyroxine  (SYNTHROID ) 75 MCG tablet  Take 75 mcg by mouth daily before breakfast.     lidocaine -prilocaine  (EMLA ) cream Apply to affected area once 30 g 3   Magnesium 250 MG TABS Take 250 mg by mouth daily.     meloxicam (MOBIC) 7.5 MG tablet Take 7.5 mg by mouth daily.     Multiple Vitamins-Minerals (PRESERVISION AREDS 2+MULTI VIT PO) Take 1 tablet by mouth 2 (two) times daily.     Omega-3 Fatty Acids (OMEGA 3 500) 500 MG CAPS Take 500 mg by mouth daily.     ondansetron  (ZOFRAN ) 8 MG tablet Take 1 tablet (8 mg total) by mouth every 8 (eight) hours as needed for nausea or vomiting. Start on the third day after carboplatin . 30 tablet 1   polyethylene glycol (MIRALAX  / GLYCOLAX ) 17 g packet Take 17 g by mouth daily as needed for mild constipation. 14 each 0   prochlorperazine  (COMPAZINE ) 10 MG tablet Take 1 tablet (10 mg total) by mouth every 6 (six) hours as needed for nausea or vomiting. 30 tablet 1   senna-docusate (SENOKOT-S) 8.6-50 MG tablet Take 1 tablet by mouth 2 (two) times daily. 180 tablet 0   No current facility-administered medications for this visit.    SUMMARY OF ONCOLOGIC HISTORY: Oncology History  Malignant ovarian neoplasm, right (HCC)  10/26/2023 Imaging   IR IMAGING GUIDED PORT INSERTION  Result Date: 11/01/2023 INDICATION: 78 year old female with suspected gynecologic malignancy requiring central venous access for chemotherapy. EXAM: IMPLANTED PORT A CATH PLACEMENT WITH ULTRASOUND AND FLUOROSCOPIC GUIDANCE COMPARISON:  None Available. MEDICATIONS: None. ANESTHESIA/SEDATION: Moderate (conscious) sedation was employed during this procedure. A total of Versed  1 mg and Fentanyl  50 mcg was administered intravenously. Moderate Sedation Time: 15 minutes. The patient's level of consciousness and vital signs were monitored continuously by radiology nursing throughout the procedure under my direct supervision. CONTRAST:  None FLUOROSCOPY TIME:  One mGy COMPLICATIONS: None immediate. PROCEDURE: The procedure, risks,  benefits, and alternatives were explained to the patient. Questions regarding the procedure were encouraged and answered. The patient understands and consents to the procedure. The right neck and chest were prepped with chlorhexidine  in a sterile fashion, and a sterile drape was applied covering the operative field. Maximum barrier sterile technique with sterile gowns and gloves were used for the procedure. A timeout was performed prior to the initiation of the procedure. Ultrasound was used to examine the jugular vein which was compressible and free of internal echoes. A skin marker was used to demarcate the planned venotomy and port pocket incision sites. Local anesthesia was provided to these sites and the subcutaneous tunnel track with 1% lidocaine  with 1:100,000 epinephrine . A small incision was created at the jugular access site and blunt dissection was performed of the subcutaneous tissues. Under ultrasound guidance, the jugular vein was accessed with a 21 ga micropuncture needle and an 0.018 wire was inserted to the superior vena cava. Real-time ultrasound guidance was utilized for vascular access including the acquisition of a permanent ultrasound image documenting patency of the accessed vessel. A 5 Fr micopuncture set was then used, through which a 0.035 Rosen wire was passed under fluoroscopic guidance into the inferior vena cava. An 8 Fr dilator was then placed over the wire.  A subcutaneous port pocket was then created along the upper chest wall utilizing a combination of sharp and blunt dissection. The pocket was irrigated with sterile saline, packed with gauze, and observed for hemorrhage. A single lumen plastic power injectable port was chosen for placement. The 8 Fr catheter was tunneled from the port pocket site to the venotomy incision. The port was placed in the pocket. The external catheter was trimmed to appropriate length. The dilator was exchanged for an 8 Fr peel-away sheath under  fluoroscopic guidance. The catheter was then placed through the sheath and the sheath was removed. Final catheter positioning was confirmed and documented with a fluoroscopic spot radiograph. The port was accessed with a Huber needle, aspirated, and flushed with heparinized saline. The deep dermal layer of the port pocket incision was closed with interrupted 3-0 Vicryl suture. Dermabond was then placed over the port pocket and neck incisions. The patient tolerated the procedure well without immediate post procedural complication. FINDINGS: After catheter placement, the tip lies within the superior cavoatrial junction. The catheter aspirates and flushes normally and is ready for immediate use. IMPRESSION: Successful placement of a power injectable Port-A-Cath via the right internal jugular vein. The catheter is ready for immediate use. Ester Sides, MD [facility] [place] [facility] Electronically Signed   By: Ester Sides M.D.   On: 11/01/2023 13:22   MR ABDOMEN W WO CONTRAST  Result Date: 10/31/2023 CLINICAL DATA:  Pelvic mass.  Liver lesion EXAM: MRI ABDOMEN WITHOUT AND WITH CONTRAST TECHNIQUE: Multiplanar multisequence MR imaging of the abdomen was performed both before and after the administration of intravenous contrast. CONTRAST:  5mL GADAVIST  GADOBUTROL  1 MMOL/ML IV SOLN COMPARISON:  CT 10/30/2023 and older. FINDINGS: Lower chest: Trace pleural fluid. Hepatobiliary: Numerous bright T2, low T1 nonenhancing foci identified consistent with benign cystic lesions. Many of these are under 15 mm. There are some larger foci identified such as segment 4 measuring 4.3 cm and caudate measuring 2.8 cm. Prior CT did demonstrate 1 lesion which is more complex anteriorly in the left hepatic lobe, segment 3 which on today's examination when taking into account motion show some progressive enhancement, is bright on T2 but not as bright as simple fluid and consistent with a small  hemangioma. No specific aggressive liver lesion clearly identified today. Patent portal vein. Gallbladder is nondilated. No biliary ductal dilatation. Pancreas: Ectatic pancreatic duct diffusely measuring up to 6 mm, severe. No pancreatic focal atrophy, abnormal enhancement or abnormal T1 signal. No restricted diffusion along the pancreas. Spleen:  Within normal limits in size and appearance. Adrenals/Urinary Tract: Adrenal glands are preserved. No enhancing renal mass or collecting system dilatation. Stomach/Bowel: Visualized bowel is nondilated. This includes visualized portions of the small and large bowel. The stomach is underdistended. Vascular/Lymphatic: Normal caliber aorta and IVC. Atherosclerotic changes along the aorta. Circumaortic left renal vein. Once again there is a abnormal lymph node identified anterior to the aorta in the upper abdomen on series 1602, image 58 measuring 2.3 x 1.8 cm. Few other small retroperitoneal nodes identified. Other:  Trace ascites.  Mesenteric stranding. Musculoskeletal: Curvature and degenerative changes along the spine. IMPRESSION: Multiple benign-appearing liver lesions including cysts and 1 hemangioma. Persistent enlarged upper abdominal retroperitoneal lymph node. Additional smaller but prominent nodes as well. With the pelvic findings these are worrisome for potential spread of neoplasm. Mild ascites. Electronically Signed   By: Ranell Bring M.D.   On: 10/31/2023 14:10   MR PELVIS W WO CONTRAST  Result  Date: 10/31/2023 CLINICAL DATA:  Pelvic mass EXAM: MRI PELVIS WITHOUT AND WITH CONTRAST TECHNIQUE: Multiplanar multisequence MR imaging of the pelvis was performed both before and after administration of intravenous contrast. CONTRAST:  5mL GADAVIST  GADOBUTROL  1 MMOL/ML IV SOLN COMPARISON:  CT 10/30/2019 FINDINGS: Urinary Tract: Bladder is mildly distended with fluid. There is some mass effect along the posterior aspect of the bladder related to the adjacent complex  mass. The bladder itself appears grossly intact. No abnormal wall enhancement. Grossly preserved course of the urethra. Bowel: The visualized bowel in the pelvis is nondilated. However there is lobular masslike area along the distal sigmoid colon with the areas of heterogeneous enhancement. The dominant lesion in this location on series 4, image 29 measures 3.7 by 2.6 cm. There are several adjacent soft tissue nodules as well such as just posterior to the cervix and anterior to the bowel measuring 10 mm on series 22 image 30. Focus superior left lateral series 22, image 26 measures 2.3 x 1.7 cm. Additional foci elsewhere dependently in the pelvis including the presacral space. Vascular/Lymphatic: Atherosclerotic changes identified along the iliac vessels. No separate nodal enlargement identified. Reproductive: Uterus measures 8.2 by 2.0 by 3.4 cm. Endometrial stripe is less than 3 mm. Slightly heterogeneous myometrium. Anterior to the uterus is a large complex cystic and solid mass with heterogeneous enhancement of the solid component. Lesion measures 11.5 by 7.6 by 7.4 cm. The more solid component is right lateral inferior with a aggressive in enhancement measuring proximally 7.7 by 5.6 cm. Cephalocaudal length 8.5 cm. The cystic component more towards the left has a dimensions approaching 8.7 cm. There is surrounding free fluid and edema. Although this more in the central pelvis towards midline a right ovary is not seen as a separate structure. There is what appears to be a small postmenopausal of the left ovary measuring 15 mm. An ovarian neoplasm is a strong consideration. Other:  Small amount of free fluid in the pelvis.  Edema. Musculoskeletal: Curvature of the spine. Moderate degenerative changes of the lumbar spine with disc bulging and areas of stenosis greatest at L4-5. Degenerative changes of the pelvis and hips as well. Study is somewhat limited due to some artifacts postcontrast axial dataset as well as  study being performed as a standard pelvis rather than a gynecologic pelvis exam. Please see separate dictation of abdomen MRI. IMPRESSION: Large complex cystic and solid pelvic mass centrally measuring up to 11.5 x 7.6 x 7.4 cm. Based on overall appearance this has worrisome for a neoplasm including an ovarian malignancy. Small amount of ascites in the pelvis. Soft tissue enhancing aggressive nodules along the course of the sigmoid colon as well as in the adjacent fat and presacral spaces. With the larger central mass this very well could be spread of disease to adjacent structures rather than a primary colonic process but correlate with symptoms and if needed colonoscopy. Please see separate dictation of abdominal MRI. Electronically Signed   By: Ranell Bring M.D.   On: 10/31/2023 14:01   CT CHEST W CONTRAST  Result Date: 10/31/2023 CLINICAL DATA:  78 year old female with suspected gynecologic malignancy. * Tracking Code: BO * EXAM: CT CHEST WITH CONTRAST TECHNIQUE: Multidetector CT imaging of the chest was performed during intravenous contrast administration. RADIATION DOSE REDUCTION: This exam was performed according to the departmental dose-optimization program which includes automated exposure control, adjustment of the mA and/or kV according to patient size and/or use of iterative reconstruction technique. CONTRAST:  75mL OMNIPAQUE  IOHEXOL  350  MG/ML SOLN COMPARISON:  None Available. FINDINGS: Cardiovascular: The heart size is mildly enlarged. No pericardial effusion. Aortic atherosclerosis and coronary artery calcification. Mediastinum/Nodes: Trachea and esophagus appear unremarkable. The right lobe of thyroid  gland appears surgically absent. No mediastinal or hilar adenopathy. Lungs/Pleura: No pleural effusion identified. Subsegmental atelectasis identified within the lingula and bilateral posterior lung bases. No signs of interstitial edema or airspace consolidation. No suspicious pulmonary nodule  identified to suggest lung metastases. Upper Abdomen: No acute abnormality. Multiple liver cysts. Enlarged lymph node within the portal caval region measures 1.5 cm, image 155/3. Defer to report from CT AP dated 10/30/2019 for and same-day MRI of the abdomen pelvis for further details. Musculoskeletal: Mild curvature of the thoracic spine and lumbar spine is convex towards the left. Multilevel degenerative disc disease. No acute or suspicious osseous lesions. IMPRESSION: 1. No signs of metastatic disease to the chest. 2. Areas of subsegmental atelectasis noted within bilateral posterior lung bases and lingula. 3. Enlarged upper abdominal lymph node as above. In the setting of a known malignancy this is concerning for nodal metastasis. 4. Coronary artery calcifications. 5.  Aortic Atherosclerosis (ICD10-I70.0). Electronically Signed   By: Waddell Calk M.D.   On: 10/31/2023 05:57   CT ABDOMEN PELVIS W CONTRAST  Result Date: 10/30/2023 CLINICAL DATA:  Abdominal pain. EXAM: CT ABDOMEN AND PELVIS WITH CONTRAST TECHNIQUE: Multidetector CT imaging of the abdomen and pelvis was performed using the standard protocol following bolus administration of intravenous contrast. RADIATION DOSE REDUCTION: This exam was performed according to the departmental dose-optimization program which includes automated exposure control, adjustment of the mA and/or kV according to patient size and/or use of iterative reconstruction technique. CONTRAST:  75mL OMNIPAQUE  IOHEXOL  350 MG/ML SOLN COMPARISON:  Limb 1424 FINDINGS: Lower chest: No acute findings. Hepatobiliary: Multiple hepatic cysts evident. Scattered tiny hypodensities in the liver parenchyma are too small to characterize but are statistically most likely benign. No followup imaging is recommended. Tiny subcapsular lesion measured previously at 9 mm in the anterior left liver is stable on image 23/3 today, nonspecific. There is no evidence for gallstones, gallbladder wall  thickening, or pericholecystic fluid. No intrahepatic or extrahepatic biliary dilation. Pancreas: Dilatation of the pancreatic duct to the head and body of pancreas is similar to prior. Spleen: No splenomegaly. No suspicious focal mass lesion. Adrenals/Urinary Tract: No adrenal nodule or mass. Kidneys unremarkable. No evidence for hydroureter. Bladder is distended. Stomach/Bowel: Stomach is unremarkable. No gastric wall thickening. No evidence of outlet obstruction. Duodenum is normally positioned as is the ligament of Treitz. No small bowel wall thickening. No small bowel dilatation. Diverticular changes are noted in the left colon without evidence of diverticulitis. Vascular/Lymphatic: 16 mm short axis portal caval lymph node seen on 22/3. No para-aortic lymphadenopathy. No pelvic sidewall lymphadenopathy. Reproductive: Multiple uterine fibroids evident. As noted on prior study there is an area of the anterior cervix that appears to obliterate the fat plane between the cervix and the posterior wall of the bladder (see sagittal 86/7). Small soft tissue nodules are again noted in the cul-de-sac some of which may pertain to diverticuli, but others raise concern for peritoneal nodularity (see images 61 and 56 of series 3). Other: No substantial free fluid. Musculoskeletal: No worrisome lytic or sclerotic osseous abnormality. IMPRESSION: 1. Multiple uterine fibroids. As noted on prior study there is an area of the anterior cervix that appears to obliterate the fat plane between the cervix and the posterior wall of the bladder. This is concerning for a cervical  mass. Gynecologic consultation recommended. 2. Small soft tissue nodules in the cul-de-sac some of which may relate to diverticuli, but others raise concern for peritoneal nodularity. Attention on follow-up recommended. PET-CT may prove helpful to further evaluate 3. 16 mm short axis portal caval lymph node, metastatic disease not excluded. 4. Left colonic  diverticulosis without diverticulitis. Electronically Signed   By: Camellia Candle M.D.   On: 10/30/2023 13:08   CT ABDOMEN PELVIS W CONTRAST  Result Date: 10/26/2023 CLINICAL DATA:  Pt w/ abnormal US ; mass in pelvis; no h/o cancer; no pain; no urinary issues EXAM: CT ABDOMEN AND PELVIS WITH CONTRAST TECHNIQUE: Multidetector CT imaging of the abdomen and pelvis was performed using the standard protocol following bolus administration of intravenous contrast. RADIATION DOSE REDUCTION: This exam was performed according to the departmental dose-optimization program which includes automated exposure control, adjustment of the mA and/or kV according to patient size and/or use of iterative reconstruction technique. CONTRAST:  ISOVUE -300 IOPAMIDOL  (ISOVUE -300) INJECTION 61% COMPARISON:  Ultrasound pelvis 09/01/2023, ultrasound thyroid  10/10/2023 FINDINGS: Lower chest: Mitral annular calcification. Aortic valve leaflet calcification. No acute abnormality. Hepatobiliary: Multiple fluid density lesions scattered throughout the left right hepatic lobe. There an indeterminate 0.9 cm left hepatic lobe hypodensity with Hounsfield unit of 77 (2:29). No gallstones, gallbladder wall thickening, or pericholecystic fluid. No biliary dilatation. Pancreas: No focal lesion. Normal pancreatic contour. No surrounding inflammatory changes. No main pancreatic ductal dilatation. Spleen: Normal in size without focal abnormality. Adrenals/Urinary Tract: No adrenal nodule bilaterally. Bilateral kidneys enhance symmetrically. No hydronephrosis. No hydroureter. The urinary bladder is unremarkable. On delayed imaging, there is no urothelial wall thickening and there are no filling defects in the opacified portions of the bilateral collecting systems or ureters. Stomach/Bowel: Stomach is within normal limits. No evidence of small bowel wall thickening or dilatation. Increased stool burden proximal to the distal sigmoid colon mass with stool  throughout the ascending, transverse, descending colon. Short segment of distal sigmoid colon irregular bowel wall thickening (5:26, 2:62). No large bowel luminal dilatation. Colonic diverticulosis appendix appears normal. Vascular/Lymphatic: No abdominal aorta or iliac aneurysm. 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 No abdominal, pelvic, or inguinal lymphadenopathy. Reproductive: There is a heterogeneous solid and cystic 11 x 9 cm mass arising from the uterine fundus. Finding is noted to invade into the urinary bladder dome (6:81, 5:56 close) where there is loss of intraperitoneal and lower ring of the urinary bladder wall margin. The mass is noted to abut and appears to be inseparable from a short segment of distal sigmoid colon in the region of irregular bowel wall thickening. Other: No intraperitoneal free fluid. No intraperitoneal free gas. No organized fluid collection. Musculoskeletal: No abdominal wall hernia or abnormality. No suspicious lytic or blastic osseous lesions. No acute displaced fracture. Multilevel severe degenerative changes of the spine. Grade 1 anterolisthesis of L4 on L5 and L5 on S1. Mild retrolisthesis of L2 on L3 and L3 on L4. Dextroscoliosis centered at the L3-L4 level. IMPRESSION: 1. An 11 x 9 cm heterogeneous solid and cystic mass arises from the uterine fundus and is noted to invade the urinary bladder dome wall as well as the distal sigmoid colon. No associated bowel obstruction; however, constipation proximal to irregular bowel wall thickening/mass. No associated stercoral colitis. Finding consistent with malignancy. Recommend gynecologic consultation. When the patient is clinically stable and able to follow directions and hold their breath (preferably as an outpatient) further evaluation  with dedicated MRI with and without contrast should be considered. 2. Indeterminate 0.9 cm left hepatic lobe  hypodense lesion with a density of 77 HU. Question metastasis versus primary hepatic lesion. 3. Stool throughout the colon 4. Colonic diverticulosis with no acute diverticulitis. 5. 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 6.  Aortic Atherosclerosis (ICD10-I70.0)-severe. These results will be called to the ordering clinician or representative by the Radiologist Assistant, and communication documented in the PACS or [employer]. Electronically Signed   By: Morgane  Naveau M.D.   On: 10/26/2023 18:42   DG BONE DENSITY (DXA)  Result Date: 10/26/2023 EXAM: DUAL X-RAY ABSORPTIOMETRY (DXA) FOR BONE MINERAL DENSITY IMPRESSION: Referring Physician:  LAMAR NG Your patient completed a bone mineral density test using GE Lunar iDXA system (analysis version: 16). Technologist: BEC PATIENT: Name: Zaria, Taha Patient ID: [id number] Birth Date: [birth year] Height: 60.5 in. Sex: Female Measured: 10/26/2023 Weight: 110.2 lbs. Indications: Advanced Age, Caucasian, Estrogen Deficient, Height Loss (781.91), History of Osteoporosis, Levothyroxine , Postmenopausal Fractures: Left Ankle Treatments: Calcium  (E943.0), Vitamin D (E933.5) ASSESSMENT: The BMD measured at Forearm Radius 33% is 0.624 g/cm2 with a T-score of -2.9. This patient's diagnostic category is OSTEOPOROSIS according to World Health Organization [facility]) criteria. Comparison to 10/16/2019. Since the prior study, there has been a SIGNIFICANT DECREASE in bone mineral density of the hips (-4.6%). The lumbar spine was excluded due to being excluded from prior exam. The quality of the exam is good. Site Region Measured Date Measured Age YA BMD Significant CHANGE T-score Right Forearm Radius 33% 10/26/2023 77.0 -2.9 0.624 g/cm2 Right Forearm Radius 33% 10/16/2019 74.0 -2.6 0.653 g/cm2 DualFemur Neck Left 10/26/2023 77.0 -0.6 0.952 g/cm2 DualFemur Neck Left 10/16/2019 74.0 -0.5 0.962 g/cm2 DualFemur Total Mean 10/26/2023 78.0 -0.7 0.918 g/cm2 * DualFemur  Total Mean 10/16/2019 74.0 -0.4 0.962 g/cm2 World Health Organization [facility]) criteria for post-menopausal, Caucasian Women: Normal       T-score at or above -1 SD Osteopenia   T-score between -1 and -2.5 SD Osteoporosis T-score at or below -2.5 SD RECOMMENDATION: 1. All patients should optimize calcium  and vitamin D intake. 2. Consider FDA-approved medical therapies in postmenopausal women and men aged 28 years and older, based on the following: a. A hip or vertebral (clinical or morphometric) fracture. b. T-score = -2.5 at the femoral neck or spine after appropriate evaluation to exclude secondary causes. c. Low bone mass (T-score between -1.0 and -2.5 at the femoral neck or spine) and a 10-year probability of a hip fracture = 3% or a 10-year probability of a major osteoporosis-related fracture = 20% based on the [place] -adapted WHO algorithm. d. Clinician judgment and/or patient preferences may indicate treatment for people with 10-year fracture probabilities above or below these levels. FOLLOW-UP: Patients with diagnosis of osteoporosis or at high risk for fracture should have regular bone mineral density tests.? Patients eligible for Medicare are allowed routine testing every 2 years.? The testing frequency can be increased to one year for patients who have rapidly progressing disease, are receiving or discontinuing medical therapy to restore bone mass, or have additional risk factors. I have reviewed this study and agree with the findings. [facility] Electronically Signed   By: Reyes Phi M.D.   On: 10/26/2023 12:30      11/08/2023 Pathology Results   SURGICAL PATHOLOGY CASE: [phone] PATIENT: [facility] Surgical Pathology Report  Clinical History: Pelvic mass, suspected malignancy (crm)   FINAL MICROSCOPIC DIAGNOSIS:  A. PELVIC SIDEWALL NODULE, RIGHT, EXCISION: - High-grade  serous carcinoma, see comment  B. ABDOMINAL WALL #1, ANTERIOR, EXCISION: - High-grade serous  carcinoma  COMMENT:  A.  Immunohistochemical stain show that the tumor cells are positive for CK7, PAX8, and p16 (diffuse overexpression).  Immunostain for p53 shows a clonal null expression pattern.  Immunostains for CK20 is negative. This immunoprofile is consistent with the above interpretation and suggestive of an ovarian primary.    11/14/2023 Initial Diagnosis   Malignant ovarian neoplasm, right (HCC)   11/14/2023 Cancer Staging   Staging form: Ovary, Fallopian Tube, and Primary Peritoneal Carcinoma, AJCC 8th Edition - Clinical stage from 11/14/2023: FIGO Stage IIIC (cT3c, cN1, cM0) - Signed by Lonn Hicks, MD on 11/14/2023 Stage prefix: Initial diagnosis   11/20/2023 Tumor Marker   Patient's tumor was tested for the following markers: CA-125. Results of the tumor marker test revealed 169.   11/22/2023 -  Chemotherapy   Patient is on Treatment Plan : OVARIAN Carboplatin  (AUC 6) + Paclitaxel  (175) q21d X 6 Cycles       PHYSICAL EXAMINATION: ECOG PERFORMANCE STATUS: 1 - Symptomatic but completely ambulatory  Vitals:   12/14/23 1255  BP: 125/83  Pulse: 97  Resp: 18  Temp: 98.2 F (36.8 C)  SpO2: 99%   Filed Weights   12/14/23 1255  Weight: 109 lb 12.8 oz (49.8 kg)    GENERAL:alert, no distress and comfortable  NEURO: alert & oriented x 3 with fluent speech, no focal motor/sensory deficits  LABORATORY DATA:  I have reviewed the data as listed    Component Value Date/Time   NA 138 12/14/2023 1240   K 3.8 12/14/2023 1240   CL 100 12/14/2023 1240   CO2 30 12/14/2023 1240   GLUCOSE 132 (H) 12/14/2023 1240   BUN 15 12/14/2023 1240   CREATININE 0.55 12/14/2023 1240   CALCIUM  9.5 12/14/2023 1240   PROT 6.8 12/14/2023 1240   ALBUMIN  3.6 12/14/2023 1240   AST 19 12/14/2023 1240   ALT 11 12/14/2023 1240   ALKPHOS 72 12/14/2023 1240   BILITOT 0.5 12/14/2023 1240   GFRNONAA >60 12/14/2023 1240    No results found for: SPEP, UPEP  Lab Results  Component Value  Date   WBC 10.5 12/14/2023   NEUTROABS 8.4 (H) 12/14/2023   HGB 12.6 12/14/2023   HCT 38.3 12/14/2023   MCV 89.1 12/14/2023   PLT 200 12/14/2023      Chemistry      Component Value Date/Time   NA 138 12/14/2023 1240   K 3.8 12/14/2023 1240   CL 100 12/14/2023 1240   CO2 30 12/14/2023 1240   BUN 15 12/14/2023 1240   CREATININE 0.55 12/14/2023 1240      Component Value Date/Time   CALCIUM  9.5 12/14/2023 1240   ALKPHOS 72 12/14/2023 1240   AST 19 12/14/2023 1240   ALT 11 12/14/2023 1240   BILITOT 0.5 12/14/2023 1240       RADIOGRAPHIC STUDIES: I have reviewed multiple imaging study with the patient I have personally reviewed the radiological images as listed and agreed with the findings in the report. CT ABDOMEN PELVIS W CONTRAST Result Date: 12/07/2023 CLINICAL DATA:  History of ovarian cancer with two day history of right lower quadrant abdominal pain. * Tracking Code: BO * EXAM: CT ABDOMEN AND PELVIS WITH CONTRAST TECHNIQUE: Multidetector CT imaging of the abdomen and pelvis was performed using the standard protocol following bolus administration of intravenous contrast. RADIATION DOSE REDUCTION: This exam was performed according to the departmental dose-optimization program which  includes automated exposure control, adjustment of the mA and/or kV according to patient size and/or use of iterative reconstruction technique. CONTRAST:  OMNIPAQUE  IOHEXOL  300 MG/ML  SOLN COMPARISON:  MR abdomen and pelvis dated 10/30/2023, CT abdomen and pelvis dated 10/30/2023 FINDINGS: Lower chest: No focal consolidation or pulmonary nodule in the lung bases. No pleural effusion or pneumothorax demonstrated. Partially imaged heart size is normal. Coronary artery calcifications. Hepatobiliary: Multifocal hepatic cysts and hemangiomas. No intra or extrahepatic biliary ductal dilation. Normal gallbladder. Pancreas: Similar prominence of the pancreatic duct measuring up to 7 mm. Focal pancreatic  lesions. Spleen: Normal in size without focal abnormality. Adrenals/Urinary Tract: No adrenal nodules. No suspicious renal mass, calculi or hydronephrosis. No focal bladder wall thickening. Stomach/Bowel: Normal appearance of the stomach. Interval decrease in size of sigmoid colonic mass measuring 2.5 x 2.4 cm (2:66), previously 3.7 x 2.6 cm. Adjacent soft tissue nodule located posterior to the cervix is not substantially changed measuring 11 mm (2:68). Similar size of left presacral nodule measuring 2.5 x 1.4 cm (2:61), previously 2.3 x 1.7 cm. Short-segment circumferential mural thickening involving the ascending colon (8:56) and proximal transverse colon (8:38) were not seen on prior examination. Appendix is not discretely seen. Vascular/Lymphatic: Aortic atherosclerosis. Circumaortic left renal vein. Decreased size of 11 mm periportal lymph node, previously 16 mm. Reproductive: Interval re-orientation of the right ovarian mass. The solid component is now located along the superior right extending into the right lower quadrant with the dominant cystic portion located anterior inferiorly, where as the solid portion was predominantly inferiorly located previously and the cystic portion located superolaterally to the left. Allowing for differences in orientation, there has been interval increase in size of the predominantly solid component measuring 12.0 x 8.2 x 8.3 cm (2:52, 10:61), previously 8.0 x 7.5 x 7.6 cm. Other: No free fluid, fluid collection, or free air. Musculoskeletal: No acute or abnormal lytic or blastic osseous lesions. Multilevel degenerative changes of the partially imaged thoracic and lumbar spine. IMPRESSION: 1. Interval re-orientation of the right ovarian mass with enlargement of the predominantly solid component measuring 12.0 x 8.2 x 8.3 cm, previously 8.0 x 7.5 x 7.6 cm, now extending into the right lower quadrant. 2. Interval decrease in size of sigmoid colonic mass. Adjacent soft tissue  nodule located posterior to the cervix and left presacral region are not substantially changed. 3. Decreased size of periportal lymph node. 4. Short-segment circumferential mural thickening involving the ascending colon and proximal transverse colon not seen on prior examination, which may represent transient peristalsis or foci of colitis. 5.  Aortic Atherosclerosis (ICD10-I70.0). Electronically Signed   By: Limin  Xu M.D.   On: 12/07/2023 16:41   DG Abd 2 Views Result Date: 12/07/2023 CLINICAL DATA:  78 year old female with bloating and constipation. EXAM: ABDOMEN - 2 VIEW COMPARISON:  CT Abdomen and Pelvis 10/30/2023. FINDINGS: Upright and supine views at 1214 hours. Non obstructed bowel gas pattern. Below average volume of retained stool. No pneumoperitoneum. Degenerative lumbar scoliosis. Stable cardiomegaly. Lung bases appear negative. Increased pelvic and lower abdominal density appears compatible with uterine/ovarian masses demonstrated last month. IMPRESSION: Non obstructed bowel gas pattern. Below average volume of retained stool. No pneumoperitoneum. Electronically Signed   By: VEAR Hurst M.D.   On: 12/07/2023 12:27   ECHOCARDIOGRAM COMPLETE Result Date: 11/29/2023    ECHOCARDIOGRAM REPORT   Patient Name:   KALEIAH KUTZER Date of Exam: 11/29/2023 Medical Rec #:  [id number]         Height:  61.0 in Accession #:    [id number]        Weight:       111.2 lb Date of Birth:  16-Nov-1946        BSA:          1.472 m Patient Age:    58 years          BP:           157/84 mmHg Patient Gender: F                 HR:           81 bpm. 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 Risk Factors:Dyslipidemia.  Sonographer:    Augustin Seals RDCS Referring Phys:  [id number] [facility] CUSTOVIC IMPRESSIONS  1. LVOT gradient, peak gradient 26 mmHg. no SAM. Left ventricular ejection fraction, by estimation, is 60 to 65%. The left ventricle has normal function. The left ventricle has no regional wall motion abnormalities. There is moderate asymmetric left ventricular hypertrophy of the basal-septal segment. Left ventricular diastolic parameters are consistent with Grade I diastolic dysfunction (impaired relaxation).  2. Right ventricular systolic function is normal. The right ventricular size is normal. There is normal pulmonary artery systolic pressure.  3. The mitral valve is abnormal. No evidence of mitral valve regurgitation. Moderate mitral annular calcification.  4. The aortic valve is tricuspid. Aortic valve regurgitation is mild.  5. Aneurysm of the ascending aorta, measuring 40 mm. There is moderate dilatation of the aortic root, measuring 39 mm.  6. The inferior vena cava is normal in size with greater than 50% respiratory variability, suggesting right atrial pressure of 3 mmHg. FINDINGS  Left Ventricle: LVOT gradient, peak gradient 26 mmHg. no SAM. Left ventricular ejection fraction, by estimation, is 60 to 65%. The left ventricle has normal function. The left ventricle has no regional wall motion abnormalities. The left ventricular internal cavity size was normal in size. There is moderate asymmetric left ventricular hypertrophy of the basal-septal segment. Left ventricular diastolic parameters are consistent with Grade I diastolic dysfunction (impaired relaxation). Right Ventricle: The right ventricular size is normal. Right ventricular systolic function is normal. There is normal pulmonary artery systolic pressure. The tricuspid regurgitant velocity is 1.84 m/s, and with an assumed right atrial pressure of 3 mmHg,  the estimated right ventricular systolic pressure is 16.5 mmHg. Left Atrium: Left atrial size was normal in size. Right Atrium: Right atrial size was normal in  size. Pericardium: There is no evidence of pericardial effusion. Mitral Valve: The mitral valve is abnormal. Moderate mitral annular calcification. No evidence of mitral valve regurgitation. Tricuspid Valve: Tricuspid valve regurgitation is trivial. Aortic Valve: The aortic valve is tricuspid. Aortic valve regurgitation is mild. Pulmonic Valve: Pulmonic valve regurgitation is not visualized. Aorta: There is moderate dilatation of the aortic root, measuring 39 mm. There is an aneurysm involving the ascending aorta measuring 40 mm. Venous: The inferior vena cava is normal in size with greater than 50% respiratory variability, suggesting right atrial pressure of 3 mmHg. IAS/Shunts: The interatrial septum was not well visualized.  LEFT VENTRICLE PLAX 2D LVIDd:         3.30  cm   Diastology LVIDs:         2.10 cm   LV e' medial:    6.16 cm/s LV PW:         1.10 cm   LV E/e' medial:  7.6 LV IVS:        1.50 cm   LV e' lateral:   6.89 cm/s LVOT diam:     2.20 cm   LV E/e' lateral: 6.8 LV SV:         69 LV SV Index:   47 LVOT Area:     3.80 cm                           3D Volume EF:                          3D EF:        67 %                          LV EDV:       56 ml                          LV ESV:       19 ml                          LV SV:        37 ml RIGHT VENTRICLE RV Basal diam:  2.60 cm RV Mid diam:    2.10 cm RV S prime:     10.30 cm/s LEFT ATRIUM           Index        RIGHT ATRIUM          Index LA diam:      3.10 cm 2.11 cm/m   RA Area:     6.59 cm LA Vol (A2C): 24.8 ml 16.85 ml/m  RA Volume:   10.50 ml 7.13 ml/m LA Vol (A4C): 12.3 ml 8.36 ml/m  AORTIC VALVE LVOT Vmax:   99.60 cm/s LVOT Vmean:  67.100 cm/s LVOT VTI:    0.181 m  AORTA Ao Root diam: 3.90 cm Ao Asc diam:  4.00 cm MITRAL VALVE               TRICUSPID VALVE MV Area (PHT): 4.39 cm    TR Peak grad:   13.5 mmHg MV Decel Time: 173 msec    TR Vmax:        184.00 cm/s MV E velocity: 46.60 cm/s MV A velocity: 90.70 cm/s  SHUNTS MV E/A ratio:  0.51         Systemic VTI:  0.18 m                            Systemic Diam: 2.20 cm Ronal Ross Electronically signed by Ronal Ross Signature Date/Time: 11/29/2023/1:15:43 PM    Final

## 2023-12-14 NOTE — Assessment & Plan Note (Signed)
 I have reviewed multiple imaging studies with the patient and her son I do not believe the patient have disease progression.  There was almost 3-1/2 weeks between the first imaging study and the start date of her treatment The plan will be to proceed with chemotherapy as scheduled I plan to repeat imaging study after cycle 3 of therapy We discussed various strategies to help minimize risk of toxicities moving forward

## 2023-12-14 NOTE — Assessment & Plan Note (Signed)
 We discussed various strategies to cope with anxiety associated with her treatment

## 2023-12-14 NOTE — Assessment & Plan Note (Signed)
 She had history of severe constipation with treatment We discussed twice daily MiraLAX and Senokot for 3 days after treatment

## 2023-12-15 ENCOUNTER — Other Ambulatory Visit: Payer: Self-pay

## 2023-12-15 ENCOUNTER — Inpatient Hospital Stay: Payer: Medicare HMO

## 2023-12-15 ENCOUNTER — Telehealth: Payer: Self-pay

## 2023-12-15 DIAGNOSIS — C561 Malignant neoplasm of right ovary: Secondary | ICD-10-CM

## 2023-12-15 MED ORDER — DEXAMETHASONE 4 MG PO TABS: ORAL_TABLET | ORAL | 6 refills | Status: DC

## 2023-12-15 MED ORDER — LIDOCAINE-PRILOCAINE 2.5-2.5 % EX CREA: TOPICAL_CREAM | CUTANEOUS | 3 refills | Status: DC

## 2023-12-15 MED ORDER — SENNOSIDES-DOCUSATE SODIUM 8.6-50 MG PO TABS: 2.0000 | ORAL_TABLET | Freq: Two times a day (BID) | ORAL | 2 refills | Status: AC | PRN

## 2023-12-15 MED ORDER — POLYETHYLENE GLYCOL 3350 17 G PO PACK: 17.0000 g | PACK | Freq: Two times a day (BID) | ORAL | 0 refills | Status: DC | PRN

## 2023-12-15 MED ORDER — PROCHLORPERAZINE MALEATE 10 MG PO TABS: 10.0000 mg | ORAL_TABLET | Freq: Four times a day (QID) | ORAL | 1 refills | Status: DC | PRN

## 2023-12-15 MED ORDER — ONDANSETRON HCL 8 MG PO TABS: 8.0000 mg | ORAL_TABLET | Freq: Three times a day (TID) | ORAL | 1 refills | Status: DC | PRN

## 2023-12-15 MED FILL — Fosaprepitant Dimeglumine For IV Infusion 150 MG (Base Eq): INTRAVENOUS | Qty: 5 | Status: AC

## 2023-12-15 NOTE — Telephone Encounter (Signed)
 Returned call to son and sent requested Rx's to [facility] pharmacy in [place] for assisted living facility. Requests that all Rx be sent to [facility] pharmacy in the future.

## 2023-12-18 ENCOUNTER — Other Ambulatory Visit: Payer: Self-pay

## 2023-12-18 ENCOUNTER — Inpatient Hospital Stay: Payer: Medicare HMO

## 2023-12-18 ENCOUNTER — Other Ambulatory Visit: Payer: Self-pay | Admitting: Psychiatry

## 2023-12-18 ENCOUNTER — Other Ambulatory Visit: Payer: Self-pay | Admitting: Hematology and Oncology

## 2023-12-18 VITALS — BP 147/94 | HR 92 | Temp 97.8°F | Resp 18 | Wt 107.8 lb

## 2023-12-18 DIAGNOSIS — K5909 Other constipation: Secondary | ICD-10-CM | POA: Diagnosis not present

## 2023-12-18 DIAGNOSIS — C561 Malignant neoplasm of right ovary: Secondary | ICD-10-CM | POA: Diagnosis not present

## 2023-12-18 DIAGNOSIS — Z5111 Encounter for antineoplastic chemotherapy: Secondary | ICD-10-CM | POA: Diagnosis not present

## 2023-12-18 DIAGNOSIS — D701 Agranulocytosis secondary to cancer chemotherapy: Secondary | ICD-10-CM | POA: Diagnosis not present

## 2023-12-18 DIAGNOSIS — R5383 Other fatigue: Secondary | ICD-10-CM | POA: Diagnosis not present

## 2023-12-18 DIAGNOSIS — F419 Anxiety disorder, unspecified: Secondary | ICD-10-CM | POA: Diagnosis not present

## 2023-12-18 MED ORDER — CETIRIZINE HCL 10 MG/ML IV SOLN
10.0000 mg | Freq: Once | INTRAVENOUS | Status: AC
Start: 2023-12-18 — End: 2023-12-18
  Administered 2023-12-18: 10 mg via INTRAVENOUS
  Filled 2023-12-18: qty 1

## 2023-12-18 MED ORDER — SODIUM CHLORIDE 0.9 % IV SOLN
175.0000 mg/m2 | Freq: Once | INTRAVENOUS | Status: AC
  Administered 2023-12-18: 258 mg via INTRAVENOUS
  Filled 2023-12-18: qty 43

## 2023-12-18 MED ORDER — SODIUM CHLORIDE 0.9% FLUSH
10.0000 mL | INTRAVENOUS | Status: DC | PRN
Start: 2023-12-18 — End: 2023-12-18
  Administered 2023-12-18: 10 mL

## 2023-12-18 MED ORDER — FAMOTIDINE IN NACL 20-0.9 MG/50ML-% IV SOLN
20.0000 mg | Freq: Once | INTRAVENOUS | Status: AC
  Administered 2023-12-18: 20 mg via INTRAVENOUS
  Filled 2023-12-18: qty 50

## 2023-12-18 MED ORDER — SODIUM CHLORIDE 0.9 % IV SOLN
366.0000 mg | Freq: Once | INTRAVENOUS | Status: AC
  Administered 2023-12-18: 370 mg via INTRAVENOUS
  Filled 2023-12-18: qty 37

## 2023-12-18 MED ORDER — PALONOSETRON HCL INJECTION 0.25 MG/5ML
0.2500 mg | Freq: Once | INTRAVENOUS | Status: AC
  Administered 2023-12-18: 0.25 mg via INTRAVENOUS
  Filled 2023-12-18: qty 5

## 2023-12-18 MED ORDER — SODIUM CHLORIDE 0.9 % IV SOLN
150.0000 mg | Freq: Once | INTRAVENOUS | Status: AC
  Administered 2023-12-18: 150 mg via INTRAVENOUS
  Filled 2023-12-18: qty 150

## 2023-12-18 MED ORDER — DEXAMETHASONE SODIUM PHOSPHATE 10 MG/ML IJ SOLN
10.0000 mg | Freq: Once | INTRAMUSCULAR | Status: AC
  Administered 2023-12-18: 10 mg via INTRAVENOUS
  Filled 2023-12-18: qty 1

## 2023-12-18 MED ORDER — HEPARIN SOD (PORK) LOCK FLUSH 100 UNIT/ML IV SOLN
500.0000 [IU] | Freq: Once | INTRAVENOUS | Status: AC | PRN
  Administered 2023-12-18: 500 [IU]

## 2023-12-18 MED ORDER — SODIUM CHLORIDE 0.9 % IV SOLN: INTRAVENOUS | Status: DC

## 2023-12-18 NOTE — Patient Instructions (Signed)
 [facility] - A DEPT OF [facility]  Discharge Instructions: Thank you for choosing [facility] to provide your oncology and hematology care.   If you have a lab appointment with the Cancer Center, please go directly to the Cancer Center and check in at the registration area.   Wear comfortable clothing and clothing appropriate for easy access to any Portacath or PICC line.   We strive to give you quality time with your provider. You may need to reschedule your appointment if you arrive late (15 or more minutes).  Arriving late affects you and other patients whose appointments are after yours.  Also, if you miss three or more appointments without notifying the office, you may be dismissed from the clinic at the provider's discretion.      For prescription refill requests, have your pharmacy contact our office and allow 72 hours for refills to be completed.    Today you received the following chemotherapy and/or immunotherapy agents: Taxol /Carbo.   To help prevent nausea and vomiting after your treatment, we encourage you to take your nausea medication as directed.  BELOW ARE SYMPTOMS THAT SHOULD BE REPORTED IMMEDIATELY: *FEVER GREATER THAN 100.4 F (38 C) OR HIGHER *CHILLS OR SWEATING *NAUSEA AND VOMITING THAT IS NOT CONTROLLED WITH YOUR NAUSEA MEDICATION *UNUSUAL SHORTNESS OF BREATH *UNUSUAL BRUISING OR BLEEDING *URINARY PROBLEMS (pain or burning when urinating, or frequent urination) *BOWEL PROBLEMS (unusual diarrhea, constipation, pain near the anus) TENDERNESS IN MOUTH AND THROAT WITH OR WITHOUT PRESENCE OF ULCERS (sore throat, sores in mouth, or a toothache) UNUSUAL RASH, SWELLING OR PAIN  UNUSUAL VAGINAL DISCHARGE OR ITCHING   Items with * indicate a potential emergency and should be followed up as soon as possible or go to the Emergency Department if any problems should occur.  Please show the CHEMOTHERAPY ALERT CARD or IMMUNOTHERAPY  ALERT CARD at check-in to the Emergency Department and triage nurse.  Should you have questions after your visit or need to cancel or reschedule your appointment, please contact [facility] - A DEPT OF [facility]  Dept: [phone]  and follow the prompts.  Office hours are 8:00 a.m. to 4:30 p.m. Monday - Friday. Please note that voicemails left after 4:00 p.m. may not be returned until the following business day.  We are closed weekends and major holidays. You have access to a nurse at all times for urgent questions. Please call the main number to the clinic Dept: [phone] and follow the prompts.   For any non-urgent questions, you may also contact your provider using MyChart. We now offer e-Visits for anyone 44 and older to request care online for non-urgent symptoms. For details visit [email or web address].   Also download the MyChart app! Go to the app store, search MyChart, open the app, select [place], and log in with your MyChart username and password.

## 2023-12-20 ENCOUNTER — Telehealth: Payer: Self-pay | Admitting: Hematology and Oncology

## 2023-12-20 ENCOUNTER — Other Ambulatory Visit: Payer: Self-pay | Admitting: Hematology and Oncology

## 2023-12-20 MED ORDER — HYDROCODONE-ACETAMINOPHEN 5-325 MG PO TABS: 1.0000 | ORAL_TABLET | ORAL | 0 refills | Status: DC | PRN

## 2023-12-20 NOTE — Telephone Encounter (Signed)
 TC from Pt's son requesting pain medication be sent to [facility] for Pt., Pt just moved to [facility] in [place]. Pt's Son also stated he requested medication be sent previously. Spoke with [facility] who realized Pt's name and date of birth wasn't correct. Informed Son of this matter. Message sent to Dr Lonn for pain medication refill.

## 2023-12-21 ENCOUNTER — Other Ambulatory Visit: Payer: Self-pay

## 2023-12-21 ENCOUNTER — Telehealth: Payer: Self-pay

## 2023-12-21 MED ORDER — LACTULOSE 10 GM/15ML PO SOLN: 7.0000 g | Freq: Three times a day (TID) | ORAL | 0 refills | Status: DC | PRN

## 2023-12-21 NOTE — Telephone Encounter (Signed)
 Returned call to son. His Mom has not had a bm since Monday am. She is taking miralax  and senokot as instructed. Son requesting Lactulose  Rx sent to [facility] The assisted living facility will not give without Rx to [facility] Rx sent and son will call the office back if needed.  FYI

## 2023-12-25 ENCOUNTER — Telehealth: Payer: Self-pay

## 2023-12-25 NOTE — Telephone Encounter (Signed)
 Called Lori Conrad back and told her I spoke with her son. Told her that her son will help with contacting PCP. She verbalized understanding.

## 2023-12-25 NOTE — Telephone Encounter (Signed)
 Attempted to call Cordelia Pen back. No answer.  Called son regarding Sherry's call yesterday for sleep aide and something for anxiety. Told son per Dr. Bertis Ruddy to contact PCP for medication's if needed. Son verbalized understanding.

## 2023-12-28 ENCOUNTER — Encounter: Payer: Self-pay | Admitting: Genetic Counselor

## 2023-12-28 ENCOUNTER — Telehealth: Payer: Self-pay | Admitting: Genetic Counselor

## 2023-12-28 ENCOUNTER — Ambulatory Visit: Payer: Self-pay | Admitting: Genetic Counselor

## 2023-12-28 DIAGNOSIS — Z1379 Encounter for other screening for genetic and chromosomal anomalies: Secondary | ICD-10-CM | POA: Insufficient documentation

## 2023-12-28 DIAGNOSIS — C561 Malignant neoplasm of right ovary: Secondary | ICD-10-CM

## 2023-12-28 DIAGNOSIS — Z1589 Genetic susceptibility to other disease: Secondary | ICD-10-CM

## 2023-12-28 DIAGNOSIS — Z800 Family history of malignant neoplasm of digestive organs: Secondary | ICD-10-CM

## 2023-12-28 HISTORY — DX: Genetic susceptibility to other disease: Z15.89

## 2023-12-28 NOTE — Telephone Encounter (Signed)
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  Discussed very slight increased risk for breast cancer (manage based on PH/ FH) and possible increased risk of prostate cancer for men.  No changes to management recommended for patient based on these results. Discussed family implications.  See detailed clinic note.

## 2023-12-28 NOTE — Progress Notes (Addendum)
GENETIC TEST RESULTS  Patient Name: Lori Conrad Patient Age: 78 y.o. Encounter Date: 12/28/2023  Referring Provider: Artis Delay, MD   Lori Conrad was seen in the Cancer Genetics clinic on November 29, 2024 due to a personal history of ovarian cancer and concern regarding a hereditary predisposition to cancer in the family. Please refer to the prior Genetics clinic note for more information regarding Lori Conrad medical and family histories and our assessment at the time.   FAMILY HISTORY:  We obtained a detailed, 4-generation family history.  Significant diagnoses are listed below: Family History  Problem Relation Age of Onset   Lung cancer Mother 59 - 74       smoked   Stroke Mother 21   Alzheimer's disease Father    CAD Father        95% occlusion of L Main & RCA, severe descending aorta, arterial & arteriolonephrosclerosis    Cholecystitis Father    Esophagitis Father    Cancer Brother 51 - 52       unknown type, agent orange exposure   Pancreatic cancer Conrad 71   BRCA 1/2 Neg Hx    Breast cancer Neg Hx    Colon cancer Neg Hx    Ovarian cancer Neg Hx    Endometrial cancer Neg Hx    Prostate cancer Neg Hx       Lori Conrad's Conrad was diagnosed with pancreatic cancer at age 44, she died at age 69 due to cancer metastasis. Lori Conrad brother was diagnosed with an unknown type of cancer, he was exposed to agent orange, he died in his late 33s due to cancer metastasis. Lori Conrad mother was diagnosed with lung cancer in Lori Conrad 61s, she smoked and died in Lori Conrad 70s. Lori Conrad is unaware of previous family history of genetic testing for hereditary cancer risks. Of note, Lori Conrad husband had negative hereditary cancer genetic testing this year, he has since passed. There is no reported Ashkenazi Jewish ancestry.  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  No other deleterious  variants were detected in the [facility] CancerNext-Expanded +RNAinsight Panel.  The CancerNext-Expanded gene panel offered by [facility] and includes sequencing, rearrangement, and RNA analysis for the following 76 genes: AIP, ALK, APC, ATM, AXIN2, BAP1, BARD1, BMPR1A, BRCA1, BRCA2, BRIP1, CDC73, CDH1, CDK4, CDKN1B, CDKN2A, CEBPA, CHEK2, CTNNA1, DDX41, DICER1, ETV6, FH, FLCN, GATA2, LZTR1, MAX, MBD4, MEN1, MET, MLH1, MSH2, MSH3, MSH6, MUTYH, NF1, NF2, NTHL1, PALB2, PHOX2B, PMS2, POT1, PRKAR1A, PTCH1, PTEN, RAD51C, RAD51D, RB1, RET, RUNX1, SDHA, SDHAF2, SDHB, SDHC, SDHD, SMAD4, SMARCA4, SMARCB1, SMARCE1, STK11, SUFU, TMEM127, TP53, TSC1, TSC2, VHL, and WT1 (sequencing and deletion/duplication); EGFR, HOXB13, KIT, MITF, PDGFRA, POLD1, and POLE (sequencing only); EPCAM and GREM1 (deletion/duplication only).    The test report has been scanned into EPIC and is located under the Molecular Pathology section of the Results Review tab.  A portion of the result report is included below for reference. Genetic testing reported out on December 26, 2023.      Of note, this result does not explain the personal or family history of cancer at this time.    Possible explanations for the cancer in the family may include: The cancers in Lori Conrad and/or Lori Conrad family may be familial or due to other genetic and environmental factors.   There may be a gene mutation in one of these genes that current testing methods  cannot detect but that chance is small. There could be another gene that has not yet been discovered, or that we have not yet tested, that is responsible for the cancer diagnoses in the family.  It is also possible there is a hereditary cause for the cancer in the family that Lori Conrad did not inherit.  Therefore, it is important to remain in touch with cancer genetics in the future so that we can continue to offer Lori Conrad the most up to date genetic testing.  Individuals in this family might be at some  increased risk of developing cancer, over the general population risk, due to the family history of cancer.  Individuals in the family should notify their providers of the family history of cancer.  First degree relatives of Lori Conrad, who had pancreatic cancer, are recommended to have genetic counseling and comprehensive testing in adulthood based on Lori Conrad history.   Cancer Risks for CHEK2 I157T pathogenic variant: Lori Conrad specific mutation (p.I157T) has been shown to confer a risk for breast cancer that is less than the breast cancer risk for other mutations in the CHEK2 gene. Thus, Lori Conrad mutation is classified as a low penetrance mutation. Studies in [place] of this low penetrance mutation estimated the lifetime risk for breast cancer among carriers to be increased by about 50% over the general population risk.  For those of average breast cancer risk, this low penetrance mutation in isolation is not expected to increase one's breast cancer risk >20%.  Males may be at increased risk of prostate cancer, but the exact risk figure is unknown at this time.   CHEK2 mutation are no longer thought to be associated with an increased risk for colon cancer.    Cancer risks in families with the same CHEK2 mutation can vary widely, suggesting that there are likely to be other factors involved that we don't understand yet that also contribute to the cancer risks associated with CHEK2 mutations. An individual's cancer risk and medical management are not determined by genetic test results alone. Overall cancer risk assessment incorporates additional factors, including personal medical history, family history, and any available genetic information that may result in a personalized plan for cancer prevention and surveillance.   Management Recommendations:  Breast Cancer Screening: Low penetrance variants in CHEK2 are not clinically actionable in isolation for breast cancer risk.  Breast surveillance  should be based on personalized assessment including family history as a minimum  Colon Cancer Screening: General population screening is appropriate Manage based on personal and family history   Prostate Cancer Screening: Consider beginning annual PSA blood test and digital rectal exams at age 9.   This information is based on current understanding of the gene and may change in the future.   Implications for Family Members: Hereditary predisposition to cancer due to pathogenic variants in the CHEK2 gene has autosomal dominant inheritance. This means that an individual with a pathogenic variant in CHEK2 has a 50% chance of passing the variant on to his/Lori Conrad offspring.  Family members can consider genetic testing for this familial pathogenic variant.  Caution should be used when testing individuals for low penetrance CHEK2 variants, as this variant is not clinically actionable in isolation.  Survelliance should be personalized and include family history as a minimum. As there are generally no childhood cancer risks associated with single pathogenic variants in the CHEK2 gene, individuals in the family are not recommended to have testing until they reach at least 79 years of age.  Complimentary testing through [facility] for the familial variant is available for 90 days after the report date (December 26, 2023)  They may contact our office at [phone] for more information or to schedule an appointment. Family members who live outside of the area are encouraged to find a genetic counselor in their area by visiting: [email or web address].   Resources: FORCE (Facing Our Risk of Cancer Empowered) is a resource for those with a hereditary predisposition to develop cancer.  FORCE provides information about risk reduction, advocacy, legislation, and clinical trials.  Additionally, [employer] provides a platform for collaboration and support; which includes: peer navigation, message  boards, local support groups, a toll-free helpline, research registry and recruitment, advocate training, published medical research, webinars, brochures, mastectomy photos, and more.  For more information, visit www.facingourrisk.org    Plan:  No changes in management recommended based on this result.  Family letter provided.     Questions were answered to Lori Conrad satisfaction, and our contact information was provided.     Ygnacio Fecteau M. Rennie Plowman, MS, [facility] Genetic Counselor [email or web address] (P) [phone]

## 2023-12-29 NOTE — Progress Notes (Signed)
Ok to proceed with C3D1 on 01/05/24 per Dr. Bertis Ruddy.

## 2024-01-04 ENCOUNTER — Inpatient Hospital Stay: Payer: Medicare HMO

## 2024-01-04 ENCOUNTER — Encounter: Payer: Self-pay | Admitting: Hematology and Oncology

## 2024-01-04 ENCOUNTER — Other Ambulatory Visit: Payer: Self-pay

## 2024-01-04 ENCOUNTER — Inpatient Hospital Stay: Payer: Medicare HMO | Admitting: Hematology and Oncology

## 2024-01-04 VITALS — BP 126/79 | HR 85 | Temp 98.0°F | Resp 18 | Ht 61.0 in | Wt 108.1 lb

## 2024-01-04 DIAGNOSIS — C561 Malignant neoplasm of right ovary: Secondary | ICD-10-CM | POA: Diagnosis not present

## 2024-01-04 DIAGNOSIS — D701 Agranulocytosis secondary to cancer chemotherapy: Secondary | ICD-10-CM | POA: Diagnosis not present

## 2024-01-04 DIAGNOSIS — F419 Anxiety disorder, unspecified: Secondary | ICD-10-CM | POA: Diagnosis not present

## 2024-01-04 DIAGNOSIS — T451X5A Adverse effect of antineoplastic and immunosuppressive drugs, initial encounter: Secondary | ICD-10-CM | POA: Diagnosis not present

## 2024-01-04 DIAGNOSIS — K5909 Other constipation: Secondary | ICD-10-CM | POA: Diagnosis not present

## 2024-01-04 DIAGNOSIS — Z5111 Encounter for antineoplastic chemotherapy: Secondary | ICD-10-CM | POA: Diagnosis not present

## 2024-01-04 DIAGNOSIS — R5383 Other fatigue: Secondary | ICD-10-CM | POA: Diagnosis not present

## 2024-01-04 LAB — CMP (CANCER CENTER ONLY)
ALT: 12 U/L (ref 0–44)
AST: 17 U/L (ref 15–41)
Albumin: 3.8 g/dL (ref 3.5–5.0)
Alkaline Phosphatase: 80 U/L (ref 38–126)
Anion gap: 8 (ref 5–15)
BUN: 19 mg/dL (ref 8–23)
CO2: 29 mmol/L (ref 22–32)
Calcium: 9.2 mg/dL (ref 8.9–10.3)
Chloride: 102 mmol/L (ref 98–111)
Creatinine: 0.63 mg/dL (ref 0.44–1.00)
GFR, Estimated: >60 mL/min (ref >60)
Glucose, Bld: 101 mg/dL — ABNORMAL HIGH (ref 70–99)
Potassium: 4 mmol/L (ref 3.5–5.1)
Sodium: 139 mmol/L (ref 135–145)
Total Bilirubin: 0.5 mg/dL (ref 0.0–1.2)
Total Protein: 6.4 g/dL — ABNORMAL LOW (ref 6.5–8.1)

## 2024-01-04 LAB — CBC WITH DIFFERENTIAL (CANCER CENTER ONLY)
Abs Immature Granulocytes: 0.02 10*3/uL (ref 0.00–0.07)
Basophils Absolute: 0 10*3/uL (ref 0.0–0.1)
Basophils Relative: 1 %
Eosinophils Absolute: 0 10*3/uL (ref 0.0–0.5)
Eosinophils Relative: 1 %
HCT: 39.1 % (ref 36.0–46.0)
Hemoglobin: 12.4 g/dL (ref 12.0–15.0)
Immature Granulocytes: 1 %
Lymphocytes Relative: 18 %
Lymphs Abs: 0.7 10*3/uL (ref 0.7–4.0)
MCH: 29 pg (ref 26.0–34.0)
MCHC: 31.7 g/dL (ref 30.0–36.0)
MCV: 91.4 fL (ref 80.0–100.0)
Monocytes Absolute: 0.7 10*3/uL (ref 0.1–1.0)
Monocytes Relative: 18 %
Neutro Abs: 2.3 10*3/uL (ref 1.7–7.7)
Neutrophils Relative %: 61 %
Platelet Count: 204 10*3/uL (ref 150–400)
RBC: 4.28 MIL/uL (ref 3.87–5.11)
RDW: 15.5 % (ref 11.5–15.5)
WBC Count: 3.7 10*3/uL — ABNORMAL LOW (ref 4.0–10.5)
nRBC: 0 % (ref 0.0–0.2)

## 2024-01-04 MED ORDER — SODIUM CHLORIDE 0.9% FLUSH
10.0000 mL | Freq: Once | INTRAVENOUS | Status: AC
Start: 2024-01-04 — End: 2024-01-04
  Administered 2024-01-04: 10 mL

## 2024-01-04 MED ORDER — HEPARIN SOD (PORK) LOCK FLUSH 100 UNIT/ML IV SOLN
500.0000 [IU] | Freq: Once | INTRAVENOUS | Status: AC
  Administered 2024-01-04: 500 [IU]

## 2024-01-04 MED FILL — Fosaprepitant Dimeglumine For IV Infusion 150 MG (Base Eq): INTRAVENOUS | Qty: 5 | Status: AC

## 2024-01-04 NOTE — Progress Notes (Signed)
[facility] OFFICE PROGRESS NOTE  Patient Care Team: Henrine Screws, MD as PCP - General (Family Medicine) Paulina Fusi Servando Snare, RN as Oncology Nurse Navigator (Oncology)  ASSESSMENT & PLAN:  Malignant ovarian neoplasm, right [facility]) She tolerated recent treatment well Her abdominal bloating or distention has improved She is now having more regular bowel habits I suspect she is responding well to treatment I plan to order repeat imaging study in 2 weeks Given extensive disease involvement, I recommends 4 cycles of chemotherapy before surgery  Leukopenia due to antineoplastic chemotherapy [facility]) This is likely due to recent treatment. The patient denies recent history of fevers, cough, chills, diarrhea or dysuria. She is asymptomatic from the leukopenia. I will observe for now.  I will continue the chemotherapy at current dose without dosage adjustment.  If the leukopenia gets progressive worse in the future, I might have to delay her treatment or adjust the chemotherapy dose.   Orders Placed This Encounter  Procedures   CT ABDOMEN PELVIS W CONTRAST    Standing Status:   Future    Expected Date:   01/18/2024    Expiration Date:   01/03/2025    If indicated for the ordered procedure, I authorize the administration of contrast media per Radiology protocol:   Yes    Does the patient have a contrast media/X-ray dye allergy?:   No    Preferred imaging location?:   [facility]    If indicated for the ordered procedure, I authorize the administration of oral contrast media per Radiology protocol:   Yes    All questions were answered. The patient knows to call the clinic with any problems, questions or concerns. The total time spent in the appointment was 30 minutes encounter with patients including review of chart and various tests results, discussions about plan of care and coordination of care plan   Artis Delay, MD 01/04/2024 2:45 PM  INTERVAL HISTORY: Please see below for  problem oriented charting. she returns for chemo follow-up with her son She is doing well She denies severe constipation with recent treatment She noted less abdominal distention Denies peripheral neuropathy We discussed test results and plan for repeat staging CT imaging next month  REVIEW OF SYSTEMS:   Constitutional: Denies fevers, chills or abnormal weight loss Eyes: Denies blurriness of vision Ears, nose, mouth, throat, and face: Denies mucositis or sore throat Respiratory: Denies cough, dyspnea or wheezes Cardiovascular: Denies palpitation, chest discomfort or lower extremity swelling Gastrointestinal:  Denies nausea, heartburn or change in bowel habits Skin: Denies abnormal skin rashes Lymphatics: Denies new lymphadenopathy or easy bruising Neurological:Denies numbness, tingling or new weaknesses Behavioral/Psych: Mood is stable, no new changes  All other systems were reviewed with the patient and are negative.  I have reviewed the past medical history, past surgical history, social history and family history with the patient and they are unchanged from previous note.  ALLERGIES:  has no known allergies.  MEDICATIONS:  Current Outpatient Medications  Medication Sig Dispense Refill   atorvastatin (LIPITOR) 40 MG tablet Take 40 mg by mouth at bedtime.     Calcium Carb-Cholecalciferol (SUPER CALCIUM 600 + D 400 PO) Take 1 tablet by mouth daily.     Cholecalciferol (VITAMIN D) 50 MCG (2000 UT) tablet Take 2,000 Units by mouth daily.     Coenzyme Q10 (COQ10) 400 MG CAPS Take 400 mg by mouth daily.     dexamethasone (DECADRON) 4 MG tablet Take 2 tabs at the night before and 2  tab the morning of chemotherapy, every 3 weeks, by mouth x 6 cycles 24 tablet 6   doxylamine, Sleep, (UNISOM) 25 MG tablet Take 25 mg by mouth at bedtime as needed for sleep.     Glucosamine-Chondroit-Vit C-Mn (GLUCOSAMINE 1500 COMPLEX PO) Take 1 tablet by mouth daily.     HYDROcodone-acetaminophen  (NORCO/VICODIN) 5-325 MG tablet Take 1-2 tablets by mouth every 4 (four) hours as needed for moderate pain (pain score 4-6) or severe pain (pain score 7-10). 30 tablet 0   lactulose (CHRONULAC) 10 GM/15ML solution Take 10.5 mLs (7 g total) by mouth 3 (three) times daily as needed for mild constipation. 236 mL 0   levothyroxine (SYNTHROID) 75 MCG tablet Take 75 mcg by mouth daily before breakfast.     lidocaine-prilocaine (EMLA) cream Apply to affected area once daily prn 30 g 3   Magnesium 250 MG TABS Take 250 mg by mouth daily.     meloxicam (MOBIC) 7.5 MG tablet Take 7.5 mg by mouth daily.     Multiple Vitamins-Minerals (PRESERVISION AREDS 2+MULTI VIT PO) Take 1 tablet by mouth 2 (two) times daily.     Omega-3 Fatty Acids (OMEGA 3 500) 500 MG CAPS Take 500 mg by mouth daily.     ondansetron (ZOFRAN) 8 MG tablet Take 1 tablet (8 mg total) by mouth every 8 (eight) hours as needed for nausea or vomiting. Start on the third day after carboplatin. 30 tablet 1   polyethylene glycol (MIRALAX / GLYCOLAX) 17 g packet Take 17 g by mouth 2 (two) times daily as needed for mild constipation. 14 each 0   prochlorperazine (COMPAZINE) 10 MG tablet Take 1 tablet (10 mg total) by mouth every 6 (six) hours as needed for nausea or vomiting. 30 tablet 1   senna-docusate (SENOKOT-S) 8.6-50 MG tablet Take 2 tablets by mouth 2 (two) times daily as needed for mild constipation. For 3 days after chemotherapy and then prn as needed. 120 tablet 2   No current facility-administered medications for this visit.    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 EXAM: IMPLANTED PORT A CATH PLACEMENT WITH ULTRASOUND  AND FLUOROSCOPIC GUIDANCE COMPARISON:  None Available. MEDICATIONS: None. ANESTHESIA/SEDATION: Moderate (conscious) sedation was employed during this procedure. A total of Versed 1 mg and Fentanyl 50 mcg was administered intravenously. Moderate Sedation Time: 15 minutes. The patient's level of consciousness and vital signs were monitored continuously by radiology nursing throughout the procedure under my direct supervision. CONTRAST:  None FLUOROSCOPY TIME:  One mGy COMPLICATIONS: None immediate. PROCEDURE: The procedure, risks, benefits, and alternatives were explained to the patient. Questions regarding the procedure were encouraged and answered. The patient understands and consents to the procedure. The right neck and chest were prepped with chlorhexidine in a sterile fashion, and a sterile drape was applied covering the operative field. Maximum barrier sterile technique with sterile gowns and gloves were used for the procedure. A timeout was performed prior to the initiation of the procedure. Ultrasound was used to examine the jugular vein which was compressible and free of internal echoes. A skin marker was used to demarcate the planned venotomy and port pocket incision sites. Local anesthesia was provided to these sites and the subcutaneous tunnel track with 1%  lidocaine with 1:100,000 epinephrine. A small incision was created at the jugular access site and blunt dissection was performed of the subcutaneous tissues. Under ultrasound guidance, the jugular vein was accessed with a 21 ga micropuncture needle and an 0.018" wire was inserted to the superior vena cava. Real-time ultrasound guidance was utilized for vascular access including the acquisition of a permanent ultrasound image documenting patency of the accessed vessel. A 5 Fr micopuncture set was then used, through which a 0.035" Rosen wire was passed under fluoroscopic guidance into the inferior vena cava. An 8 Fr dilator was then placed over the wire.  A subcutaneous port pocket was then created along the upper chest wall utilizing a combination of sharp and blunt dissection. The pocket was irrigated with sterile saline, packed with gauze, and observed for hemorrhage. A single lumen plastic power injectable port was chosen for placement. The 8 Fr catheter was tunneled from the port pocket site to the venotomy incision. The port was placed in the pocket. The external catheter was trimmed to appropriate length. The dilator was exchanged for an 8 Fr peel-away sheath under fluoroscopic guidance. The catheter was then placed through the sheath and the sheath was removed. Final catheter positioning was confirmed and documented with a fluoroscopic spot radiograph. The port was accessed with a Huber needle, aspirated, and flushed with heparinized saline. The deep dermal layer of the port pocket incision was closed with interrupted 3-0 Vicryl suture. Dermabond was then placed over the port pocket and neck incisions. The patient tolerated the procedure well without immediate post procedural complication. FINDINGS: After catheter placement, the tip lies within the superior cavoatrial junction. The catheter aspirates and flushes normally and is ready for immediate use. IMPRESSION: Successful placement of a power injectable Port-A-Cath via the right internal jugular vein. The catheter is ready for immediate use. Marliss Coots, MD [facility] Electronically Signed   By: Marliss Coots M.D.   On: 11/01/2023 13:22   MR ABDOMEN W WO CONTRAST  Result Date: 10/31/2023 CLINICAL DATA:  Pelvic mass.  Liver lesion EXAM: MRI ABDOMEN WITHOUT AND WITH CONTRAST TECHNIQUE: Multiplanar multisequence MR imaging of the abdomen was performed both before and after the administration of intravenous contrast. CONTRAST:  5mL GADAVIST GADOBUTROL 1 MMOL/ML IV SOLN COMPARISON:  CT 10/30/2023 and older. FINDINGS: Lower chest: Trace pleural  fluid. Hepatobiliary: Numerous bright T2, low T1 nonenhancing foci identified consistent with benign cystic lesions. Many of these are under 15 mm. There are some larger foci identified such as segment 4 measuring 4.3 cm and caudate measuring 2.8 cm. Prior CT did demonstrate 1 lesion which is more complex anteriorly in the left hepatic lobe, segment 3 which on today's examination when taking into account motion show some progressive enhancement, is bright on T2 but not as bright as simple fluid and consistent with a small hemangioma. No specific aggressive liver lesion clearly identified today. Patent portal vein. Gallbladder is nondilated. No biliary ductal dilatation. Pancreas: Ectatic pancreatic duct diffusely measuring up to 6 mm, severe. No pancreatic focal atrophy, abnormal enhancement or abnormal T1 signal. No restricted diffusion along the pancreas. Spleen:  Within normal limits in size and appearance. Adrenals/Urinary Tract: Adrenal glands are preserved. No enhancing renal mass or collecting system dilatation. Stomach/Bowel: Visualized bowel is nondilated. This includes visualized portions of the small and large bowel. The stomach is underdistended. Vascular/Lymphatic: Normal caliber aorta and IVC. Atherosclerotic changes along the aorta. Circumaortic left renal vein. Once again there is a  abnormal lymph node identified anterior to the aorta in the upper abdomen on series 1602, image 58 measuring 2.3 x 1.8 cm. Few other small retroperitoneal nodes identified. Other:  Trace ascites.  Mesenteric stranding. Musculoskeletal: Curvature and degenerative changes along the spine. IMPRESSION: Multiple benign-appearing liver lesions including cysts and 1 hemangioma. Persistent enlarged upper abdominal retroperitoneal lymph node. Additional smaller but prominent nodes as well. With the pelvic findings these are worrisome for potential spread of neoplasm. Mild ascites. Electronically Signed   By: Karen Kays M.D.    On: 10/31/2023 14:10   MR PELVIS W WO CONTRAST  Result Date: 10/31/2023 CLINICAL DATA:  Pelvic mass EXAM: MRI PELVIS WITHOUT AND WITH CONTRAST TECHNIQUE: Multiplanar multisequence MR imaging of the pelvis was performed both before and after administration of intravenous contrast. CONTRAST:  5mL GADAVIST GADOBUTROL 1 MMOL/ML IV SOLN COMPARISON:  CT 10/30/2019 FINDINGS: Urinary Tract: Bladder is mildly distended with fluid. There is some mass effect along the posterior aspect of the bladder related to the adjacent complex mass. The bladder itself appears grossly intact. No abnormal wall enhancement. Grossly preserved course of the urethra. Bowel: The visualized bowel in the pelvis is nondilated. However there is lobular masslike area along the distal sigmoid colon with the areas of heterogeneous enhancement. The dominant lesion in this location on series 4, image 29 measures 3.7 by 2.6 cm. There are several adjacent soft tissue nodules as well such as just posterior to the cervix and anterior to the bowel measuring 10 mm on series 22 image 30. Focus superior left lateral series 22, image 26 measures 2.3 x 1.7 cm. Additional foci elsewhere dependently in the pelvis including the presacral space. Vascular/Lymphatic: Atherosclerotic changes identified along the iliac vessels. No separate nodal enlargement identified. Reproductive: Uterus measures 8.2 by 2.0 by 3.4 cm. Endometrial stripe is less than 3 mm. Slightly heterogeneous myometrium. Anterior to the uterus is a large complex cystic and solid mass with heterogeneous enhancement of the solid component. Lesion measures 11.5 by 7.6 by 7.4 cm. The more solid component is right lateral inferior with a aggressive in enhancement measuring proximally 7.7 by 5.6 cm. Cephalocaudal length 8.5 cm. The cystic component more towards the left has a dimensions approaching 8.7 cm. There is surrounding free fluid and edema. Although this more in the central pelvis towards  midline a right ovary is not seen as a separate structure. There is what appears to be a small postmenopausal of the left ovary measuring 15 mm. An ovarian neoplasm is a strong consideration. Other:  Small amount of free fluid in the pelvis.  Edema. Musculoskeletal: Curvature of the spine. Moderate degenerative changes of the lumbar spine with disc bulging and areas of stenosis greatest at L4-5. Degenerative changes of the pelvis and hips as well. Study is somewhat limited due to some artifacts postcontrast axial dataset as well as study being performed as a standard pelvis rather than a gynecologic pelvis exam. Please see separate dictation of abdomen MRI. IMPRESSION: Large complex cystic and solid pelvic mass centrally measuring up to 11.5 x 7.6 x 7.4 cm. Based on overall appearance this has worrisome for a neoplasm including an ovarian malignancy. Small amount of ascites in the pelvis. Soft tissue enhancing aggressive nodules along the course of the sigmoid colon as well as in the adjacent fat and presacral spaces. With the larger central mass this very well could be spread of disease to adjacent structures rather than a primary colonic process but correlate with symptoms and if needed  colonoscopy. Please see separate dictation of abdominal MRI. Electronically Signed   By: Karen Kays M.D.   On: 10/31/2023 14:01   CT CHEST W CONTRAST  Result Date: 10/31/2023 CLINICAL DATA:  78 year old female with suspected gynecologic malignancy. * Tracking Code: BO * EXAM: CT CHEST WITH CONTRAST TECHNIQUE: Multidetector CT imaging of the chest was performed during intravenous contrast administration. RADIATION DOSE REDUCTION: This exam was performed according to the departmental dose-optimization program which includes automated exposure control, adjustment of the mA and/or kV according to patient size and/or use of iterative reconstruction technique. CONTRAST:  75mL OMNIPAQUE IOHEXOL 350 MG/ML SOLN COMPARISON:  None  Available. FINDINGS: Cardiovascular: The heart size is mildly enlarged. No pericardial effusion. Aortic atherosclerosis and coronary artery calcification. Mediastinum/Nodes: Trachea and esophagus appear unremarkable. The right lobe of thyroid gland appears surgically absent. No mediastinal or hilar adenopathy. Lungs/Pleura: No pleural effusion identified. Subsegmental atelectasis identified within the lingula and bilateral posterior lung bases. No signs of interstitial edema or airspace consolidation. No suspicious pulmonary nodule identified to suggest lung metastases. Upper Abdomen: No acute abnormality. Multiple liver cysts. Enlarged lymph node within the portal caval region measures 1.5 cm, image 155/3. Defer to report from CT AP dated 10/30/2019 for and same-day MRI of the abdomen pelvis for further details. Musculoskeletal: Mild curvature of the thoracic spine and lumbar spine is convex towards the left. Multilevel degenerative disc disease. No acute or suspicious osseous lesions. IMPRESSION: 1. No signs of metastatic disease to the chest. 2. Areas of subsegmental atelectasis noted within bilateral posterior lung bases and lingula. 3. Enlarged upper abdominal lymph node as above. In the setting of a known malignancy this is concerning for nodal metastasis. 4. Coronary artery calcifications. 5.  Aortic Atherosclerosis (ICD10-I70.0). Electronically Signed   By: Signa Kell M.D.   On: 10/31/2023 05:57   CT ABDOMEN PELVIS W CONTRAST  Result Date: 10/30/2023 CLINICAL DATA:  Abdominal pain. EXAM: CT ABDOMEN AND PELVIS WITH CONTRAST TECHNIQUE: Multidetector CT imaging of the abdomen and pelvis was performed using the standard protocol following bolus administration of intravenous contrast. RADIATION DOSE REDUCTION: This exam was performed according to the departmental dose-optimization program which includes automated exposure control, adjustment of the mA and/or kV according to patient size and/or use of  iterative reconstruction technique. CONTRAST:  75mL OMNIPAQUE IOHEXOL 350 MG/ML SOLN COMPARISON:  Limb 1424 FINDINGS: Lower chest: No acute findings. Hepatobiliary: Multiple hepatic cysts evident. Scattered tiny hypodensities in the liver parenchyma are too small to characterize but are statistically most likely benign. No followup imaging is recommended. Tiny subcapsular lesion measured previously at 9 mm in the anterior left liver is stable on image 23/3 today, nonspecific. There is no evidence for gallstones, gallbladder wall thickening, or pericholecystic fluid. No intrahepatic or extrahepatic biliary dilation. Pancreas: Dilatation of the pancreatic duct to the head and body of pancreas is similar to prior. Spleen: No splenomegaly. No suspicious focal mass lesion. Adrenals/Urinary Tract: No adrenal nodule or mass. Kidneys unremarkable. No evidence for hydroureter. Bladder is distended. Stomach/Bowel: Stomach is unremarkable. No gastric wall thickening. No evidence of outlet obstruction. Duodenum is normally positioned as is the ligament of Treitz. No small bowel wall thickening. No small bowel dilatation. Diverticular changes are noted in the left colon without evidence of diverticulitis. Vascular/Lymphatic: 16 mm short axis portal caval lymph node seen on 22/3. No para-aortic lymphadenopathy. No pelvic sidewall lymphadenopathy. Reproductive: Multiple uterine fibroids evident. As noted on prior study there is an area of the anterior cervix that appears  to obliterate the fat plane between the cervix and the posterior wall of the bladder (see sagittal 86/7). Small soft tissue nodules are again noted in the cul-de-sac some of which may pertain to diverticuli, but others raise concern for peritoneal nodularity (see images 61 and 56 of series 3). Other: No substantial free fluid. Musculoskeletal: No worrisome lytic or sclerotic osseous abnormality. IMPRESSION: 1. Multiple uterine fibroids. As noted on prior study  there is an area of the anterior cervix that appears to obliterate the fat plane between the cervix and the posterior wall of the bladder. This is concerning for a cervical mass. Gynecologic consultation recommended. 2. Small soft tissue nodules in the cul-de-sac some of which may relate to diverticuli, but others raise concern for peritoneal nodularity. Attention on follow-up recommended. PET-CT may prove helpful to further evaluate 3. 16 mm short axis portal caval lymph node, metastatic disease not excluded. 4. Left colonic diverticulosis without diverticulitis. Electronically Signed   By: Kennith Center M.D.   On: 10/30/2023 13:08   CT ABDOMEN PELVIS W CONTRAST  Result Date: 10/26/2023 CLINICAL DATA:  Pt w/ abnormal [place]; mass in pelvis; no h/o cancer; no pain; no urinary issues EXAM: CT ABDOMEN AND PELVIS WITH CONTRAST TECHNIQUE: Multidetector CT imaging of the abdomen and pelvis was performed using the standard protocol following bolus administration of intravenous contrast. RADIATION DOSE REDUCTION: This exam was performed according to the departmental dose-optimization program which includes automated exposure control, adjustment of the mA and/or kV according to patient size and/or use of iterative reconstruction technique. CONTRAST:  ISOVUE-300 IOPAMIDOL (ISOVUE-300) INJECTION 61% COMPARISON:  Ultrasound pelvis 09/01/2023, ultrasound thyroid 10/10/2023 FINDINGS: Lower chest: Mitral annular calcification. Aortic valve leaflet calcification. No acute abnormality. Hepatobiliary: Multiple fluid density lesions scattered throughout the left right hepatic lobe. There an indeterminate 0.9 cm left hepatic lobe hypodensity with Hounsfield unit of 77 (2:29). No gallstones, gallbladder wall thickening, or pericholecystic fluid. No biliary dilatation. Pancreas: No focal lesion. Normal pancreatic contour. No surrounding inflammatory changes. No main pancreatic ductal dilatation. Spleen: Normal in size without  focal abnormality. Adrenals/Urinary Tract: No adrenal nodule bilaterally. Bilateral kidneys enhance symmetrically. No hydronephrosis. No hydroureter. The urinary bladder is unremarkable. On delayed imaging, there is no urothelial wall thickening and there are no filling defects in the opacified portions of the bilateral collecting systems or ureters. Stomach/Bowel: Stomach is within normal limits. No evidence of small bowel wall thickening or dilatation. Increased stool burden proximal to the distal sigmoid colon mass with stool throughout the ascending, transverse, descending colon. Short segment of distal sigmoid colon irregular bowel wall thickening (5:26, 2:62). No large bowel luminal dilatation. Colonic diverticulosis appendix appears normal. Vascular/Lymphatic: No abdominal aorta or iliac aneurysm. 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 No abdominal, pelvic, or inguinal lymphadenopathy. Reproductive: There is a heterogeneous solid and cystic 11 x 9 cm mass arising from the uterine fundus. Finding is noted to invade into the urinary bladder dome (6:81, 5:56 close) where there is loss of intraperitoneal and lower ring of the urinary bladder wall margin. The mass is noted to abut and appears to be inseparable from a short segment of distal sigmoid colon in the region of irregular bowel wall thickening. Other: No intraperitoneal free fluid. No intraperitoneal free gas. No organized fluid collection. Musculoskeletal: No abdominal wall hernia or abnormality. No suspicious lytic or blastic osseous lesions. No acute displaced fracture. Multilevel severe degenerative changes of the spine. Grade  1 anterolisthesis of L4 on L5 and L5 on S1. Mild retrolisthesis of L2 on L3 and L3 on L4. Dextroscoliosis centered at the L3-L4 level. IMPRESSION: 1. An 11 x 9 cm heterogeneous solid and cystic mass arises from the uterine fundus and is noted to  invade the urinary bladder dome wall as well as the distal sigmoid colon. No associated bowel obstruction; however, constipation proximal to irregular bowel wall thickening/mass. No associated stercoral colitis. Finding consistent with malignancy. Recommend gynecologic consultation. When the patient is clinically stable and able to follow directions and hold their breath (preferably as an outpatient) further evaluation with dedicated MRI with and without contrast should be considered. 2. Indeterminate 0.9 cm left hepatic lobe hypodense lesion with a density of 77 HU. Question metastasis versus primary hepatic lesion. 3. Stool throughout the colon 4. Colonic diverticulosis with no acute diverticulitis. 5. 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 6.  Aortic Atherosclerosis (ICD10-I70.0)-severe. These results will be called to the ordering clinician or representative by the Radiologist Assistant, and communication documented in the PACS or [employer]. Electronically Signed   By: Tish Frederickson M.D.   On: 10/26/2023 18:42   DG BONE DENSITY (DXA)  Result Date: 10/26/2023 EXAM: DUAL X-RAY ABSORPTIOMETRY (DXA) FOR BONE MINERAL DENSITY IMPRESSION: Referring Physician:  Henrine Screws Your patient completed a bone mineral density test using GE Lunar iDXA system (analysis version: 16). Technologist: BEC PATIENT: Name: Lori Conrad, Lori Conrad Patient ID: [id number] Birth Date: 21-Jul-1946 Height: 60.5 in. Sex: Female Measured: 10/26/2023 Weight: 110.2 lbs. Indications: Advanced Age, Caucasian, Estrogen Deficient, Height Loss (781.91), History of Osteoporosis, Levothyroxine, Postmenopausal Fractures: Left Ankle Treatments: Calcium (E943.0), Vitamin D (E933.5) ASSESSMENT: The BMD measured at Forearm Radius 33% is 0.624 g/cm2 with a T-score of -2.9. This patient's diagnostic category is OSTEOPOROSIS according to World Health Organization [facility]) criteria. Comparison to 10/16/2019. Since the prior study, there has been a  SIGNIFICANT DECREASE in bone mineral density of the hips (-4.6%). The lumbar spine was excluded due to being excluded from prior exam. The quality of the exam is good. Site Region Measured Date Measured Age YA BMD Significant CHANGE T-score Right Forearm Radius 33% 10/26/2023 77.0 -2.9 0.624 g/cm2 Right Forearm Radius 33% 10/16/2019 74.0 -2.6 0.653 g/cm2 DualFemur Neck Left 10/26/2023 77.0 -0.6 0.952 g/cm2 DualFemur Neck Left 10/16/2019 74.0 -0.5 0.962 g/cm2 DualFemur Total Mean 10/26/2023 78.0 -0.7 0.918 g/cm2 * DualFemur Total Mean 10/16/2019 74.0 -0.4 0.962 g/cm2 World Health Organization [facility]) criteria for post-menopausal, Caucasian Women: Normal       T-score at or above -1 SD Osteopenia   T-score between -1 and -2.5 SD Osteoporosis T-score at or below -2.5 SD RECOMMENDATION: 1. All patients should optimize calcium and vitamin D intake. 2. Consider FDA-approved medical therapies in postmenopausal women and men aged 6 years and older, based on the following: a. A hip or vertebral (clinical or morphometric) fracture. b. T-score = -2.5 at the femoral neck or spine after appropriate evaluation to exclude secondary causes. c. Low bone mass (T-score between -1.0 and -2.5 at the femoral neck or spine) and a 10-year probability of a hip fracture = 3% or a 10-year probability of a major osteoporosis-related fracture = 20% based on the [place]-adapted WHO algorithm. d. Clinician judgment and/or patient preferences may indicate treatment for people with 10-year fracture probabilities above or below these levels. FOLLOW-UP: Patients with diagnosis of osteoporosis or at high risk for fracture should have regular bone mineral density tests.? Patients eligible for Medicare are allowed routine  testing every 2 years.? The testing frequency can be increased to one year for patients who have rapidly progressing disease, are receiving or discontinuing medical therapy to restore bone mass, or have additional risk factors. I have  reviewed this study and agree with the findings. [facility] Electronically Signed   By: Harmon Pier M.D.   On: 10/26/2023 12:30      11/08/2023 Pathology Results   SURGICAL PATHOLOGY CASE: [id number] PATIENT: [facility] Surgical Pathology Report  Clinical History: Pelvic mass, suspected malignancy (crm)   FINAL MICROSCOPIC DIAGNOSIS:  A. PELVIC SIDEWALL NODULE, RIGHT, EXCISION: - High-grade serous carcinoma, see comment  B. ABDOMINAL WALL #1, ANTERIOR, EXCISION: - High-grade serous carcinoma  COMMENT:  A.  Immunohistochemical stain show that the tumor cells are positive for CK7, PAX8, and p16 (diffuse overexpression).  Immunostain for p53 shows a clonal null expression pattern.  Immunostains for CK20 is negative. This immunoprofile is consistent with the above interpretation and suggestive of an ovarian primary.    11/14/2023 Initial Diagnosis   Malignant ovarian neoplasm, right (HCC)   11/14/2023 Cancer Staging   Staging form: Ovary, Fallopian Tube, and Primary Peritoneal Carcinoma, AJCC 8th Edition - Clinical stage from 11/14/2023: FIGO Stage IIIC (cT3c, cN1, cM0) - Signed by Artis Delay, MD on 11/14/2023 Stage prefix: Initial diagnosis   11/20/2023 Tumor Marker   Patient's tumor was tested for the following markers: CA-125. Results of the tumor marker test revealed 169.   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  No other deleterious variants detected in [facility] CancerNext-Expanded +RNAinsight Panel.  Report date is 12/26/2023.   The CancerNext-Expanded gene panel offered by [facility] and includes sequencing, rearrangement, and RNA analysis for the following 76 genes: AIP, ALK, APC, ATM, AXIN2, BAP1, BARD1, BMPR1A, BRCA1, BRCA2, BRIP1, CDC73, CDH1, CDK4,  CDKN1B, CDKN2A, CEBPA, CHEK2, CTNNA1, DDX41, DICER1, ETV6, FH, FLCN, GATA2, LZTR1, MAX, MBD4, MEN1, MET, MLH1, MSH2, MSH3, MSH6, MUTYH, NF1, NF2, NTHL1, PALB2, PHOX2B, PMS2, POT1, PRKAR1A, PTCH1, PTEN, RAD51C, RAD51D, RB1, RET, RUNX1, SDHA, SDHAF2, SDHB, SDHC, SDHD, SMAD4, SMARCA4, SMARCB1, SMARCE1, STK11, SUFU, TMEM127, TP53, TSC1, TSC2, VHL, and WT1 (sequencing and deletion/duplication); EGFR, HOXB13, KIT, MITF, PDGFRA, POLD1, and POLE (sequencing only); EPCAM and GREM1 (deletion/duplication only).       PHYSICAL EXAMINATION: ECOG PERFORMANCE STATUS: 0 - Asymptomatic  Vitals:   01/04/24 1345  BP: 126/79  Pulse: 85  Resp: 18  Temp: 98 F (36.7 C)  SpO2: 97%   Filed Weights   01/04/24 1345  Weight: 108 lb 1.6 oz (49 kg)    GENERAL:alert, no distress and comfortable  LABORATORY DATA:  I have reviewed the data as listed    Component Value Date/Time   NA 139 01/04/2024 1257   K 4.0 01/04/2024 1257   CL 102 01/04/2024 1257   CO2 29 01/04/2024 1257   GLUCOSE 101 (H) 01/04/2024 1257   BUN 19 01/04/2024 1257   CREATININE 0.63 01/04/2024 1257   CALCIUM 9.2 01/04/2024 1257   PROT 6.4 (L) 01/04/2024 1257   ALBUMIN 3.8 01/04/2024 1257   AST 17 01/04/2024 1257   ALT 12 01/04/2024 1257   ALKPHOS 80 01/04/2024 1257   BILITOT 0.5 01/04/2024 1257   GFRNONAA >60 01/04/2024 1257    No results found for: "SPEP", "  UPEP"  Lab Results  Component Value Date   WBC 3.7 (L) 01/04/2024   NEUTROABS 2.3 01/04/2024   HGB 12.4 01/04/2024   HCT 39.1 01/04/2024   MCV 91.4 01/04/2024   PLT 204 01/04/2024      Chemistry      Component Value Date/Time   NA 139 01/04/2024 1257   K 4.0 01/04/2024 1257   CL 102 01/04/2024 1257   CO2 29 01/04/2024 1257   BUN 19 01/04/2024 1257   CREATININE 0.63 01/04/2024 1257      Component Value Date/Time   CALCIUM 9.2 01/04/2024 1257   ALKPHOS 80 01/04/2024 1257   AST 17 01/04/2024 1257   ALT 12 01/04/2024 1257   BILITOT 0.5 01/04/2024 1257

## 2024-01-04 NOTE — Assessment & Plan Note (Signed)
She tolerated recent treatment well Her abdominal bloating or distention has improved She is now having more regular bowel habits I suspect she is responding well to treatment I plan to order repeat imaging study in 2 weeks Given extensive disease involvement, I recommends 4 cycles of chemotherapy before surgery

## 2024-01-04 NOTE — Assessment & Plan Note (Signed)
 This is likely due to recent treatment. The patient denies recent history of fevers, cough, chills, diarrhea or dysuria. She is asymptomatic from the leukopenia. I will observe for now.  I will continue the chemotherapy at current dose without dosage adjustment.  If the leukopenia gets progressive worse in the future, I might have to delay her treatment or adjust the chemotherapy dose.

## 2024-01-05 ENCOUNTER — Inpatient Hospital Stay: Payer: Medicare HMO

## 2024-01-05 VITALS — BP 140/88 | HR 85 | Temp 98.4°F | Resp 19

## 2024-01-05 DIAGNOSIS — C561 Malignant neoplasm of right ovary: Secondary | ICD-10-CM

## 2024-01-05 DIAGNOSIS — K5909 Other constipation: Secondary | ICD-10-CM | POA: Diagnosis not present

## 2024-01-05 DIAGNOSIS — R5383 Other fatigue: Secondary | ICD-10-CM | POA: Diagnosis not present

## 2024-01-05 DIAGNOSIS — D701 Agranulocytosis secondary to cancer chemotherapy: Secondary | ICD-10-CM | POA: Diagnosis not present

## 2024-01-05 DIAGNOSIS — F419 Anxiety disorder, unspecified: Secondary | ICD-10-CM | POA: Diagnosis not present

## 2024-01-05 DIAGNOSIS — Z5111 Encounter for antineoplastic chemotherapy: Secondary | ICD-10-CM | POA: Diagnosis not present

## 2024-01-05 LAB — CA 125: Cancer Antigen (CA) 125: 130 U/mL — ABNORMAL HIGH (ref 0.0–38.1)

## 2024-01-05 MED ORDER — PACLITAXEL CHEMO INJECTION 300 MG/50ML
175.0000 mg/m2 | Freq: Once | INTRAVENOUS | Status: AC
  Administered 2024-01-05: 258 mg via INTRAVENOUS
  Filled 2024-01-05: qty 43

## 2024-01-05 MED ORDER — FAMOTIDINE IN NACL 20-0.9 MG/50ML-% IV SOLN
20.0000 mg | Freq: Once | INTRAVENOUS | Status: AC
  Administered 2024-01-05: 20 mg via INTRAVENOUS
  Filled 2024-01-05: qty 50

## 2024-01-05 MED ORDER — PALONOSETRON HCL INJECTION 0.25 MG/5ML
0.2500 mg | Freq: Once | INTRAVENOUS | Status: AC
  Administered 2024-01-05: 0.25 mg via INTRAVENOUS
  Filled 2024-01-05: qty 5

## 2024-01-05 MED ORDER — SODIUM CHLORIDE 0.9 % IV SOLN
366.0000 mg | Freq: Once | INTRAVENOUS | Status: AC
  Administered 2024-01-05: 370 mg via INTRAVENOUS
  Filled 2024-01-05: qty 37

## 2024-01-05 MED ORDER — SODIUM CHLORIDE 0.9 % IV SOLN: INTRAVENOUS | Status: DC

## 2024-01-05 MED ORDER — HEPARIN SOD (PORK) LOCK FLUSH 100 UNIT/ML IV SOLN
500.0000 [IU] | Freq: Once | INTRAVENOUS | Status: AC | PRN
  Administered 2024-01-05: 500 [IU]

## 2024-01-05 MED ORDER — SODIUM CHLORIDE 0.9 % IV SOLN
150.0000 mg | Freq: Once | INTRAVENOUS | Status: AC
  Administered 2024-01-05: 150 mg via INTRAVENOUS
  Filled 2024-01-05: qty 150

## 2024-01-05 MED ORDER — CETIRIZINE HCL 10 MG/ML IV SOLN
10.0000 mg | Freq: Once | INTRAVENOUS | Status: AC
  Administered 2024-01-05: 10 mg via INTRAVENOUS
  Filled 2024-01-05: qty 1

## 2024-01-05 MED ORDER — SODIUM CHLORIDE 0.9% FLUSH
10.0000 mL | INTRAVENOUS | Status: DC | PRN
  Administered 2024-01-05: 10 mL

## 2024-01-05 MED ORDER — DEXAMETHASONE SODIUM PHOSPHATE 10 MG/ML IJ SOLN
10.0000 mg | Freq: Once | INTRAMUSCULAR | Status: AC
  Administered 2024-01-05: 10 mg via INTRAVENOUS
  Filled 2024-01-05: qty 1

## 2024-01-05 NOTE — Patient Instructions (Signed)
[facility] - A DEPT OF [facility]  Discharge Instructions: Thank you for choosing [facility] to provide your oncology and hematology care.   If you have a lab appointment with the Cancer Center, please go directly to the Cancer Center and check in at the registration area.   Wear comfortable clothing and clothing appropriate for easy access to any Portacath or PICC line.   We strive to give you quality time with your provider. You may need to reschedule your appointment if you arrive late (15 or more minutes).  Arriving late affects you and other patients whose appointments are after yours.  Also, if you miss three or more appointments without notifying the office, you may be dismissed from the clinic at the provider's discretion.      For prescription refill requests, have your pharmacy contact our office and allow 72 hours for refills to be completed.    Today you received the following chemotherapy and/or immunotherapy agents: Paclitaxel and Carboplatin      To help prevent nausea and vomiting after your treatment, we encourage you to take your nausea medication as directed.  BELOW ARE SYMPTOMS THAT SHOULD BE REPORTED IMMEDIATELY: *FEVER GREATER THAN 100.4 F (38 C) OR HIGHER *CHILLS OR SWEATING *NAUSEA AND VOMITING THAT IS NOT CONTROLLED WITH YOUR NAUSEA MEDICATION *UNUSUAL SHORTNESS OF BREATH *UNUSUAL BRUISING OR BLEEDING *URINARY PROBLEMS (pain or burning when urinating, or frequent urination) *BOWEL PROBLEMS (unusual diarrhea, constipation, pain near the anus) TENDERNESS IN MOUTH AND THROAT WITH OR WITHOUT PRESENCE OF ULCERS (sore throat, sores in mouth, or a toothache) UNUSUAL RASH, SWELLING OR PAIN  UNUSUAL VAGINAL DISCHARGE OR ITCHING   Items with * indicate a potential emergency and should be followed up as soon as possible or go to the Emergency Department if any problems should occur.  Please show the CHEMOTHERAPY ALERT CARD  or IMMUNOTHERAPY ALERT CARD at check-in to the Emergency Department and triage nurse.  Should you have questions after your visit or need to cancel or reschedule your appointment, please contact [facility] - A DEPT [facility]  Dept: [phone]  and follow the prompts.  Office hours are 8:00 a.m. to 4:30 p.m. Monday - Friday. Please note that voicemails left after 4:00 p.m. may not be returned until the following business day.  We are closed weekends and major holidays. You have access to a nurse at all times for urgent questions. Please call the main number to the clinic Dept: [phone] and follow the prompts.   For any non-urgent questions, you may also contact your provider using MyChart. We now offer e-Visits for anyone 54 and older to request care online for non-urgent symptoms. For details visit [email or web address].   Also download the MyChart app! Go to the app store, search "MyChart", open the app, select [place], and log in with your MyChart username and password.

## 2024-01-08 DIAGNOSIS — H3530 Unspecified macular degeneration: Secondary | ICD-10-CM | POA: Diagnosis not present

## 2024-01-08 DIAGNOSIS — M8580 Other specified disorders of bone density and structure, unspecified site: Secondary | ICD-10-CM | POA: Diagnosis not present

## 2024-01-08 DIAGNOSIS — E039 Hypothyroidism, unspecified: Secondary | ICD-10-CM | POA: Diagnosis not present

## 2024-01-08 DIAGNOSIS — C561 Malignant neoplasm of right ovary: Secondary | ICD-10-CM | POA: Diagnosis not present

## 2024-01-08 DIAGNOSIS — D509 Iron deficiency anemia, unspecified: Secondary | ICD-10-CM | POA: Diagnosis not present

## 2024-01-08 DIAGNOSIS — E782 Mixed hyperlipidemia: Secondary | ICD-10-CM | POA: Diagnosis not present

## 2024-01-08 DIAGNOSIS — I700 Atherosclerosis of aorta: Secondary | ICD-10-CM | POA: Diagnosis not present

## 2024-01-08 DIAGNOSIS — F5101 Primary insomnia: Secondary | ICD-10-CM | POA: Diagnosis not present

## 2024-01-08 DIAGNOSIS — K5900 Constipation, unspecified: Secondary | ICD-10-CM | POA: Diagnosis not present

## 2024-01-12 ENCOUNTER — Telehealth: Payer: Self-pay

## 2024-01-12 DIAGNOSIS — I700 Atherosclerosis of aorta: Secondary | ICD-10-CM | POA: Diagnosis not present

## 2024-01-12 DIAGNOSIS — D509 Iron deficiency anemia, unspecified: Secondary | ICD-10-CM | POA: Diagnosis not present

## 2024-01-12 NOTE — Telephone Encounter (Signed)
Faxed recent labs on 1/23 to Dr. Alto Denver at her facility per the request of Marisue Humble. Faxed to [phone]. Received fax confirmation.

## 2024-01-12 NOTE — Telephone Encounter (Signed)
Returned her call. She is still having constipation. She takes the Miralax daily in the am. Then has loose stools all day. She will try drinking 1/2 and save the other 1/2 to sip on thru out the day to see if that helps with incontinence. She will call the office back for questions/concerns.

## 2024-01-17 ENCOUNTER — Telehealth: Payer: Self-pay | Admitting: Oncology

## 2024-01-17 NOTE — Telephone Encounter (Signed)
 Lori Conrad and scheduled a follow up appointment with Dr. Daisey Dryer to discuss surgery on 02/12/24 at 3:00.  Advised her that we are tentatively holding 02/20/24 for surgery. She verbalized understanding and agreement.

## 2024-01-18 ENCOUNTER — Other Ambulatory Visit: Payer: Self-pay

## 2024-01-18 ENCOUNTER — Ambulatory Visit (HOSPITAL_BASED_OUTPATIENT_CLINIC_OR_DEPARTMENT_OTHER)
Admission: RE | Admit: 2024-01-18 | Discharge: 2024-01-18 | Disposition: A | Discharge status: Home / Self Care | Payer: Medicare HMO | Source: Ambulatory Visit | Attending: Hematology and Oncology | Admitting: Hematology and Oncology

## 2024-01-18 DIAGNOSIS — C561 Malignant neoplasm of right ovary: Secondary | ICD-10-CM | POA: Diagnosis not present

## 2024-01-18 DIAGNOSIS — Z8543 Personal history of malignant neoplasm of ovary: Secondary | ICD-10-CM | POA: Diagnosis not present

## 2024-01-18 DIAGNOSIS — K5730 Diverticulosis of large intestine without perforation or abscess without bleeding: Secondary | ICD-10-CM | POA: Diagnosis not present

## 2024-01-18 MED ORDER — IOHEXOL 300 MG/ML  SOLN
100.0000 mL | Freq: Once | INTRAMUSCULAR | Status: AC | PRN
  Administered 2024-01-18: 80 mL via INTRAVENOUS

## 2024-01-24 ENCOUNTER — Telehealth: Payer: Self-pay

## 2024-01-24 NOTE — Telephone Encounter (Signed)
Pt called with concern regarding the CT abd she had 2/6. She is asking for results. Advised pt I placed call to [facility] radiology for STAT read so her results should be available for her visit with MD tomorrow. At that time, Dr Bertis Ruddy will review results. She verbalized understanding.  S/w Diane at reading room who states she will forward STAT read request.

## 2024-01-25 ENCOUNTER — Inpatient Hospital Stay: Payer: Medicare HMO | Attending: Psychiatry

## 2024-01-25 ENCOUNTER — Inpatient Hospital Stay (HOSPITAL_BASED_OUTPATIENT_CLINIC_OR_DEPARTMENT_OTHER): Payer: Medicare HMO | Admitting: Hematology and Oncology

## 2024-01-25 ENCOUNTER — Telehealth: Payer: Self-pay

## 2024-01-25 ENCOUNTER — Encounter: Payer: Self-pay | Admitting: Hematology and Oncology

## 2024-01-25 VITALS — BP 147/69 | HR 80 | Temp 99.1°F | Resp 18 | Ht 61.0 in | Wt 110.6 lb

## 2024-01-25 DIAGNOSIS — R918 Other nonspecific abnormal finding of lung field: Secondary | ICD-10-CM | POA: Diagnosis not present

## 2024-01-25 DIAGNOSIS — Z1502 Genetic susceptibility to malignant neoplasm of ovary: Secondary | ICD-10-CM | POA: Diagnosis not present

## 2024-01-25 DIAGNOSIS — C561 Malignant neoplasm of right ovary: Secondary | ICD-10-CM | POA: Insufficient documentation

## 2024-01-25 DIAGNOSIS — J948 Other specified pleural conditions: Secondary | ICD-10-CM | POA: Insufficient documentation

## 2024-01-25 DIAGNOSIS — Z1509 Genetic susceptibility to other malignant neoplasm: Secondary | ICD-10-CM | POA: Insufficient documentation

## 2024-01-25 DIAGNOSIS — Z5111 Encounter for antineoplastic chemotherapy: Secondary | ICD-10-CM | POA: Diagnosis not present

## 2024-01-25 LAB — CMP (CANCER CENTER ONLY)
ALT: 13 U/L (ref 0–44)
AST: 17 U/L (ref 15–41)
Albumin: 4 g/dL (ref 3.5–5.0)
Alkaline Phosphatase: 80 U/L (ref 38–126)
Anion gap: 3 — ABNORMAL LOW (ref 5–15)
BUN: 20 mg/dL (ref 8–23)
CO2: 31 mmol/L (ref 22–32)
Calcium: 9.3 mg/dL (ref 8.9–10.3)
Chloride: 107 mmol/L (ref 98–111)
Creatinine: 0.6 mg/dL (ref 0.44–1.00)
GFR, Estimated: >60 mL/min (ref >60)
Glucose, Bld: 155 mg/dL — ABNORMAL HIGH (ref 70–99)
Potassium: 3.9 mmol/L (ref 3.5–5.1)
Sodium: 141 mmol/L (ref 135–145)
Total Bilirubin: 0.5 mg/dL (ref 0.0–1.2)
Total Protein: 6.3 g/dL — ABNORMAL LOW (ref 6.5–8.1)

## 2024-01-25 LAB — CBC WITH DIFFERENTIAL (CANCER CENTER ONLY)
Abs Immature Granulocytes: 0.01 10*3/uL (ref 0.00–0.07)
Basophils Absolute: 0 10*3/uL (ref 0.0–0.1)
Basophils Relative: 1 %
Eosinophils Absolute: 0 10*3/uL (ref 0.0–0.5)
Eosinophils Relative: 1 %
HCT: 42.6 % (ref 36.0–46.0)
Hemoglobin: 13.6 g/dL (ref 12.0–15.0)
Immature Granulocytes: 0 %
Lymphocytes Relative: 23 %
Lymphs Abs: 0.9 10*3/uL (ref 0.7–4.0)
MCH: 29.1 pg (ref 26.0–34.0)
MCHC: 31.9 g/dL (ref 30.0–36.0)
MCV: 91.2 fL (ref 80.0–100.0)
Monocytes Absolute: 0.6 10*3/uL (ref 0.1–1.0)
Monocytes Relative: 14 %
Neutro Abs: 2.4 10*3/uL (ref 1.7–7.7)
Neutrophils Relative %: 61 %
Platelet Count: 156 10*3/uL (ref 150–400)
RBC: 4.67 MIL/uL (ref 3.87–5.11)
RDW: 16.4 % — ABNORMAL HIGH (ref 11.5–15.5)
WBC Count: 4 10*3/uL (ref 4.0–10.5)
nRBC: 0 % (ref 0.0–0.2)

## 2024-01-25 MED ORDER — SODIUM CHLORIDE 0.9% FLUSH
10.0000 mL | Freq: Once | INTRAVENOUS | Status: AC
Start: 2024-01-25 — End: 2024-01-25
  Administered 2024-01-25: 10 mL

## 2024-01-25 MED ORDER — HEPARIN SOD (PORK) LOCK FLUSH 100 UNIT/ML IV SOLN
500.0000 [IU] | Freq: Once | INTRAVENOUS | Status: AC
  Administered 2024-01-25: 500 [IU]

## 2024-01-25 MED FILL — Fosaprepitant Dimeglumine For IV Infusion 150 MG (Base Eq): INTRAVENOUS | Qty: 5 | Status: AC

## 2024-01-25 NOTE — Assessment & Plan Note (Signed)
I have reviewed multiple imaging studies with the patient She has excellent response to therapy We will proceed with treatment as scheduled tomorrow She has appointment scheduled to see GYN surgeon to see if she would be a candidate for interval debulking surgery I will tentatively schedule cycle 5 just in case if her surgery is delayed

## 2024-01-25 NOTE — Assessment & Plan Note (Signed)
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

## 2024-01-25 NOTE — Telephone Encounter (Signed)
Returned call to son. Asking if okay for his Mom to have flu vaccine. Told him yes she can have the vaccine at [facility] facility. He verbalized understanding.

## 2024-01-25 NOTE — Progress Notes (Signed)
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[name]) I have reviewed multiple imaging studies with the patient She has excellent response to therapy We will proceed with treatment as scheduled tomorrow She has appointment scheduled to see GYN surgeon to see if she would be a candidate for interval debulking surgery I will tentatively schedule cycle 5 just in case if her surgery is delayed  Pleural mass I have reviewed multiple imaging studies with the patient and her son The patient had bilateral pleural thickening at baseline imaging The patient has severe infection with recurrent pneumonia in the past and I suspect this is due to pleural scarring I do not believe this represent new metastatic disease  No orders of the defined types were placed in this encounter.   All questions were answered. The patient knows to call the clinic with any problems, questions or concerns. The total time spent in the appointment was 30 minutes encounter with patients including review of chart and various tests results, discussions about plan of care and coordination of care plan   Artis Delay, MD 01/25/2024 1:56 PM  INTERVAL HISTORY: Please see below for problem oriented charting. she returns for surveillance follow-up and review of CT imaging result She tolerated recent chemotherapy well Denies neuropathy No recent infection We reviewed multiple imaging studies and discussed future follow-up  REVIEW OF SYSTEMS:   Constitutional: Denies fevers, chills or abnormal weight loss Eyes: Denies blurriness of vision Ears, nose, mouth, throat, and face: Denies mucositis or sore throat Respiratory: Denies cough, dyspnea or wheezes Cardiovascular: Denies palpitation, chest discomfort or lower extremity  swelling Gastrointestinal:  Denies nausea, heartburn or change in bowel habits Skin: Denies abnormal skin rashes Lymphatics: Denies new lymphadenopathy or easy bruising Neurological:Denies numbness, tingling or new weaknesses Behavioral/Psych: Mood is stable, no new changes  All other systems were reviewed with the patient and are negative.  I have reviewed the past medical history, past surgical history, social history and family history with the patient and they are unchanged from previous note.  ALLERGIES:  has no known allergies.  MEDICATIONS:  Current Outpatient Medications  Medication Sig Dispense Refill   atorvastatin (LIPITOR) 40 MG tablet Take 40 mg by mouth at bedtime.     Calcium Carb-Cholecalciferol (SUPER CALCIUM 600 + D 400 PO) Take 1 tablet by mouth daily.     Cholecalciferol (VITAMIN D) 50 MCG (2000 UT) tablet Take 2,000 Units by mouth daily.     Coenzyme Q10 (COQ10) 400 MG CAPS Take 400 mg by mouth daily.     dexamethasone (DECADRON) 4 MG tablet Take 2 tabs at the night before and 2 tab the morning of chemotherapy, every 3 weeks, by mouth x 6 cycles 24 tablet 6   doxylamine, Sleep, (UNISOM) 25 MG tablet Take 25 mg by mouth at bedtime as needed for sleep.     Glucosamine-Chondroit-Vit C-Mn (GLUCOSAMINE 1500 COMPLEX PO) Take 1 tablet by mouth daily.     HYDROcodone-acetaminophen (NORCO/VICODIN) 5-325 MG tablet Take 1-2 tablets by mouth every 4 (four) hours as needed for moderate pain (pain score 4-6) or severe pain (pain score 7-10). 30 tablet 0   lactulose (CHRONULAC) 10 GM/15ML solution Take 10.5 mLs (7 g total) by mouth 3 (three) times daily as needed for mild constipation. 236 mL 0   levothyroxine (SYNTHROID) 75  MCG tablet Take 75 mcg by mouth daily before breakfast.     lidocaine-prilocaine (EMLA) cream Apply to affected area once daily prn 30 g 3   Magnesium 250 MG TABS Take 250 mg by mouth daily.     meloxicam (MOBIC) 7.5 MG tablet Take 7.5 mg by mouth daily.      Multiple Vitamins-Minerals (PRESERVISION AREDS 2+MULTI VIT PO) Take 1 tablet by mouth 2 (two) times daily.     Omega-3 Fatty Acids (OMEGA 3 500) 500 MG CAPS Take 500 mg by mouth daily.     ondansetron (ZOFRAN) 8 MG tablet Take 1 tablet (8 mg total) by mouth every 8 (eight) hours as needed for nausea or vomiting. Start on the third day after carboplatin. 30 tablet 1   polyethylene glycol (MIRALAX / GLYCOLAX) 17 g packet Take 17 g by mouth 2 (two) times daily as needed for mild constipation. 14 each 0   prochlorperazine (COMPAZINE) 10 MG tablet Take 1 tablet (10 mg total) by mouth every 6 (six) hours as needed for nausea or vomiting. 30 tablet 1   senna-docusate (SENOKOT-S) 8.6-50 MG tablet Take 2 tablets by mouth 2 (two) times daily as needed for mild constipation. For 3 days after chemotherapy and then prn as needed. 120 tablet 2   No current facility-administered medications for this visit.    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 EXAM: IMPLANTED PORT A CATH PLACEMENT WITH ULTRASOUND AND FLUOROSCOPIC GUIDANCE COMPARISON:  None Available. MEDICATIONS: None. ANESTHESIA/SEDATION: Moderate (conscious) sedation was employed during this procedure. A total of Versed 1 mg and Fentanyl 50 mcg was administered intravenously. Moderate Sedation Time: 15 minutes. The patient's level of consciousness and vital signs were monitored continuously by radiology nursing throughout the procedure under my direct supervision. CONTRAST:  None FLUOROSCOPY TIME:  One mGy COMPLICATIONS: None immediate. PROCEDURE: The procedure, risks, benefits, and alternatives were explained to the patient. Questions  regarding the procedure were encouraged and answered. The patient understands and consents to the procedure. The right neck and chest were prepped with chlorhexidine in a sterile fashion, and a sterile drape was applied covering the operative field. Maximum barrier sterile technique with sterile gowns and gloves were used for the procedure. A timeout was performed prior to the initiation of the procedure. Ultrasound was used to examine the jugular vein which was compressible and free of internal echoes. A skin marker was used to demarcate the planned venotomy and port pocket incision sites. Local anesthesia was provided to these sites and the subcutaneous tunnel track with 1% lidocaine with 1:100,000 epinephrine. A small incision was created at the jugular access site and blunt dissection was performed of the subcutaneous tissues. Under ultrasound guidance, the jugular vein was accessed with a 21 ga micropuncture needle and an 0.018" wire was inserted to the superior vena cava. Real-time ultrasound guidance was utilized for vascular access including the acquisition of a permanent ultrasound image documenting patency of the accessed vessel. A 5 Fr micopuncture set was then used, through which a 0.035" Rosen wire was passed under fluoroscopic guidance into the inferior vena cava. An 8 Fr dilator was then placed over the wire. A subcutaneous port pocket was then created along the upper chest wall utilizing a  combination of sharp and blunt dissection. The pocket was irrigated with sterile saline, packed with gauze, and observed for hemorrhage. A single lumen plastic power injectable port was chosen for placement. The 8 Fr catheter was tunneled from the port pocket site to the venotomy incision. The port was placed in the pocket. The external catheter was trimmed to appropriate length. The dilator was exchanged for an 8 Fr peel-away sheath under fluoroscopic guidance. The catheter was then placed through the sheath and  the sheath was removed. Final catheter positioning was confirmed and documented with a fluoroscopic spot radiograph. The port was accessed with a Huber needle, aspirated, and flushed with heparinized saline. The deep dermal layer of the port pocket incision was closed with interrupted 3-0 Vicryl suture. Dermabond was then placed over the port pocket and neck incisions. The patient tolerated the procedure well without immediate post procedural complication. FINDINGS: After catheter placement, the tip lies within the superior cavoatrial junction. The catheter aspirates and flushes normally and is ready for immediate use. IMPRESSION: Successful placement of a power injectable Port-A-Cath via the right internal jugular vein. The catheter is ready for immediate use. Marliss Coots, MD [facility] Radiology Electronically Signed   By: Marliss Coots M.D.   On: 11/01/2023 13:22   MR ABDOMEN W WO CONTRAST  Result Date: 10/31/2023 CLINICAL DATA:  Pelvic mass.  Liver lesion EXAM: MRI ABDOMEN WITHOUT AND WITH CONTRAST TECHNIQUE: Multiplanar multisequence MR imaging of the abdomen was performed both before and after the administration of intravenous contrast. CONTRAST:  5mL GADAVIST GADOBUTROL 1 MMOL/ML IV SOLN COMPARISON:  CT 10/30/2023 and older. FINDINGS: Lower chest: Trace pleural fluid. Hepatobiliary: Numerous bright T2, low T1 nonenhancing foci identified consistent with benign cystic lesions. Many of these are under 15 mm. There are some larger foci identified such as segment 4 measuring 4.3 cm and caudate measuring 2.8 cm. Prior CT did demonstrate 1 lesion which is more complex anteriorly in the left hepatic lobe, segment 3 which on today's examination when taking into account motion show some progressive enhancement, is bright on T2 but not as bright as simple fluid and consistent with a small hemangioma. No specific aggressive liver lesion clearly identified today.  Patent portal vein. Gallbladder is nondilated. No biliary ductal dilatation. Pancreas: Ectatic pancreatic duct diffusely measuring up to 6 mm, severe. No pancreatic focal atrophy, abnormal enhancement or abnormal T1 signal. No restricted diffusion along the pancreas. Spleen:  Within normal limits in size and appearance. Adrenals/Urinary Tract: Adrenal glands are preserved. No enhancing renal mass or collecting system dilatation. Stomach/Bowel: Visualized bowel is nondilated. This includes visualized portions of the small and large bowel. The stomach is underdistended. Vascular/Lymphatic: Normal caliber aorta and IVC. Atherosclerotic changes along the aorta. Circumaortic left renal vein. Once again there is a abnormal lymph node identified anterior to the aorta in the upper abdomen on series 1602, image 58 measuring 2.3 x 1.8 cm. Few other small retroperitoneal nodes identified. Other:  Trace ascites.  Mesenteric stranding. Musculoskeletal: Curvature and degenerative changes along the spine. IMPRESSION: Multiple benign-appearing liver lesions including cysts and 1 hemangioma. Persistent enlarged upper abdominal retroperitoneal lymph node. Additional smaller but prominent nodes as well. With the pelvic findings these are worrisome for potential spread of neoplasm. Mild ascites. Electronically Signed   By: Karen Kays M.D.   On: 10/31/2023 14:10   MR PELVIS W WO CONTRAST  Result Date: 10/31/2023 CLINICAL DATA:  Pelvic mass EXAM: MRI PELVIS WITHOUT AND WITH CONTRAST  TECHNIQUE: Multiplanar multisequence MR imaging of the pelvis was performed both before and after administration of intravenous contrast. CONTRAST:  5mL GADAVIST GADOBUTROL 1 MMOL/ML IV SOLN COMPARISON:  CT 10/30/2019 FINDINGS: Urinary Tract: Bladder is mildly distended with fluid. There is some mass effect along the posterior aspect of the bladder related to the adjacent complex mass. The bladder itself appears grossly intact. No abnormal wall  enhancement. Grossly preserved course of the urethra. Bowel: The visualized bowel in the pelvis is nondilated. However there is lobular masslike area along the distal sigmoid colon with the areas of heterogeneous enhancement. The dominant lesion in this location on series 4, image 29 measures 3.7 by 2.6 cm. There are several adjacent soft tissue nodules as well such as just posterior to the cervix and anterior to the bowel measuring 10 mm on series 22 image 30. Focus superior left lateral series 22, image 26 measures 2.3 x 1.7 cm. Additional foci elsewhere dependently in the pelvis including the presacral space. Vascular/Lymphatic: Atherosclerotic changes identified along the iliac vessels. No separate nodal enlargement identified. Reproductive: Uterus measures 8.2 by 2.0 by 3.4 cm. Endometrial stripe is less than 3 mm. Slightly heterogeneous myometrium. Anterior to the uterus is a large complex cystic and solid mass with heterogeneous enhancement of the solid component. Lesion measures 11.5 by 7.6 by 7.4 cm. The more solid component is right lateral inferior with a aggressive in enhancement measuring proximally 7.7 by 5.6 cm. Cephalocaudal length 8.5 cm. The cystic component more towards the left has a dimensions approaching 8.7 cm. There is surrounding free fluid and edema. Although this more in the central pelvis towards midline a right ovary is not seen as a separate structure. There is what appears to be a small postmenopausal of the left ovary measuring 15 mm. An ovarian neoplasm is a strong consideration. Other:  Small amount of free fluid in the pelvis.  Edema. Musculoskeletal: Curvature of the spine. Moderate degenerative changes of the lumbar spine with disc bulging and areas of stenosis greatest at L4-5. Degenerative changes of the pelvis and hips as well. Study is somewhat limited due to some artifacts postcontrast axial dataset as well as study being performed as a standard pelvis rather than a  gynecologic pelvis exam. Please see separate dictation of abdomen MRI. IMPRESSION: Large complex cystic and solid pelvic mass centrally measuring up to 11.5 x 7.6 x 7.4 cm. Based on overall appearance this has worrisome for a neoplasm including an ovarian malignancy. Small amount of ascites in the pelvis. Soft tissue enhancing aggressive nodules along the course of the sigmoid colon as well as in the adjacent fat and presacral spaces. With the larger central mass this very well could be spread of disease to adjacent structures rather than a primary colonic process but correlate with symptoms and if needed colonoscopy. Please see separate dictation of abdominal MRI. Electronically Signed   By: Karen Kays M.D.   On: 10/31/2023 14:01   CT CHEST W CONTRAST  Result Date: 10/31/2023 CLINICAL DATA:  78 year old female with suspected gynecologic malignancy. * Tracking Code: BO * EXAM: CT CHEST WITH CONTRAST TECHNIQUE: Multidetector CT imaging of the chest was performed during intravenous contrast administration. RADIATION DOSE REDUCTION: This exam was performed according to the departmental dose-optimization program which includes automated exposure control, adjustment of the mA and/or kV according to patient size and/or use of iterative reconstruction technique. CONTRAST:  75mL OMNIPAQUE IOHEXOL 350 MG/ML SOLN COMPARISON:  None Available. FINDINGS: Cardiovascular: The heart size is mildly enlarged.  No pericardial effusion. Aortic atherosclerosis and coronary artery calcification. Mediastinum/Nodes: Trachea and esophagus appear unremarkable. The right lobe of thyroid gland appears surgically absent. No mediastinal or hilar adenopathy. Lungs/Pleura: No pleural effusion identified. Subsegmental atelectasis identified within the lingula and bilateral posterior lung bases. No signs of interstitial edema or airspace consolidation. No suspicious pulmonary nodule identified to suggest lung metastases. Upper Abdomen: No  acute abnormality. Multiple liver cysts. Enlarged lymph node within the portal caval region measures 1.5 cm, image 155/3. Defer to report from CT AP dated 10/30/2019 for and same-day MRI of the abdomen pelvis for further details. Musculoskeletal: Mild curvature of the thoracic spine and lumbar spine is convex towards the left. Multilevel degenerative disc disease. No acute or suspicious osseous lesions. IMPRESSION: 1. No signs of metastatic disease to the chest. 2. Areas of subsegmental atelectasis noted within bilateral posterior lung bases and lingula. 3. Enlarged upper abdominal lymph node as above. In the setting of a known malignancy this is concerning for nodal metastasis. 4. Coronary artery calcifications. 5.  Aortic Atherosclerosis (ICD10-I70.0). Electronically Signed   By: Signa Kell M.D.   On: 10/31/2023 05:57   CT ABDOMEN PELVIS W CONTRAST  Result Date: 10/30/2023 CLINICAL DATA:  Abdominal pain. EXAM: CT ABDOMEN AND PELVIS WITH CONTRAST TECHNIQUE: Multidetector CT imaging of the abdomen and pelvis was performed using the standard protocol following bolus administration of intravenous contrast. RADIATION DOSE REDUCTION: This exam was performed according to the departmental dose-optimization program which includes automated exposure control, adjustment of the mA and/or kV according to patient size and/or use of iterative reconstruction technique. CONTRAST:  75mL OMNIPAQUE IOHEXOL 350 MG/ML SOLN COMPARISON:  Limb 1424 FINDINGS: Lower chest: No acute findings. Hepatobiliary: Multiple hepatic cysts evident. Scattered tiny hypodensities in the liver parenchyma are too small to characterize but are statistically most likely benign. No followup imaging is recommended. Tiny subcapsular lesion measured previously at 9 mm in the anterior left liver is stable on image 23/3 today, nonspecific. There is no evidence for gallstones, gallbladder wall thickening, or pericholecystic fluid. No intrahepatic or  extrahepatic biliary dilation. Pancreas: Dilatation of the pancreatic duct to the head and body of pancreas is similar to prior. Spleen: No splenomegaly. No suspicious focal mass lesion. Adrenals/Urinary Tract: No adrenal nodule or mass. Kidneys unremarkable. No evidence for hydroureter. Bladder is distended. Stomach/Bowel: Stomach is unremarkable. No gastric wall thickening. No evidence of outlet obstruction. Duodenum is normally positioned as is the ligament of Treitz. No small bowel wall thickening. No small bowel dilatation. Diverticular changes are noted in the left colon without evidence of diverticulitis. Vascular/Lymphatic: 16 mm short axis portal caval lymph node seen on 22/3. No para-aortic lymphadenopathy. No pelvic sidewall lymphadenopathy. Reproductive: Multiple uterine fibroids evident. As noted on prior study there is an area of the anterior cervix that appears to obliterate the fat plane between the cervix and the posterior wall of the bladder (see sagittal 86/7). Small soft tissue nodules are again noted in the cul-de-sac some of which may pertain to diverticuli, but others raise concern for peritoneal nodularity (see images 61 and 56 of series 3). Other: No substantial free fluid. Musculoskeletal: No worrisome lytic or sclerotic osseous abnormality. IMPRESSION: 1. Multiple uterine fibroids. As noted on prior study there is an area of the anterior cervix that appears to obliterate the fat plane between the cervix and the posterior wall of the bladder. This is concerning for a cervical mass. Gynecologic consultation recommended. 2. Small soft tissue nodules in the cul-de-sac some of  which may relate to diverticuli, but others raise concern for peritoneal nodularity. Attention on follow-up recommended. PET-CT may prove helpful to further evaluate 3. 16 mm short axis portal caval lymph node, metastatic disease not excluded. 4. Left colonic diverticulosis without diverticulitis. Electronically Signed    By: Kennith Center M.D.   On: 10/30/2023 13:08   CT ABDOMEN PELVIS W CONTRAST  Result Date: 10/26/2023 CLINICAL DATA:  Pt w/ abnormal [place]; mass in pelvis; no h/o cancer; no pain; no urinary issues EXAM: CT ABDOMEN AND PELVIS WITH CONTRAST TECHNIQUE: Multidetector CT imaging of the abdomen and pelvis was performed using the standard protocol following bolus administration of intravenous contrast. RADIATION DOSE REDUCTION: This exam was performed according to the departmental dose-optimization program which includes automated exposure control, adjustment of the mA and/or kV according to patient size and/or use of iterative reconstruction technique. CONTRAST:  ISOVUE-300 IOPAMIDOL (ISOVUE-300) INJECTION 61% COMPARISON:  Ultrasound pelvis 09/01/2023, ultrasound thyroid 10/10/2023 FINDINGS: Lower chest: Mitral annular calcification. Aortic valve leaflet calcification. No acute abnormality. Hepatobiliary: Multiple fluid density lesions scattered throughout the left right hepatic lobe. There an indeterminate 0.9 cm left hepatic lobe hypodensity with Hounsfield unit of 77 (2:29). No gallstones, gallbladder wall thickening, or pericholecystic fluid. No biliary dilatation. Pancreas: No focal lesion. Normal pancreatic contour. No surrounding inflammatory changes. No main pancreatic ductal dilatation. Spleen: Normal in size without focal abnormality. Adrenals/Urinary Tract: No adrenal nodule bilaterally. Bilateral kidneys enhance symmetrically. No hydronephrosis. No hydroureter. The urinary bladder is unremarkable. On delayed imaging, there is no urothelial wall thickening and there are no filling defects in the opacified portions of the bilateral collecting systems or ureters. Stomach/Bowel: Stomach is within normal limits. No evidence of small bowel wall thickening or dilatation. Increased stool burden proximal to the distal sigmoid colon mass with stool throughout the ascending, transverse, descending colon. Short  segment of distal sigmoid colon irregular bowel wall thickening (5:26, 2:62). No large bowel luminal dilatation. Colonic diverticulosis appendix appears normal. Vascular/Lymphatic: No abdominal aorta or iliac aneurysm. 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 No abdominal, pelvic, or inguinal lymphadenopathy. Reproductive: There is a heterogeneous solid and cystic 11 x 9 cm mass arising from the uterine fundus. Finding is noted to invade into the urinary bladder dome (6:81, 5:56 close) where there is loss of intraperitoneal and lower ring of the urinary bladder wall margin. The mass is noted to abut and appears to be inseparable from a short segment of distal sigmoid colon in the region of irregular bowel wall thickening. Other: No intraperitoneal free fluid. No intraperitoneal free gas. No organized fluid collection. Musculoskeletal: No abdominal wall hernia or abnormality. No suspicious lytic or blastic osseous lesions. No acute displaced fracture. Multilevel severe degenerative changes of the spine. Grade 1 anterolisthesis of L4 on L5 and L5 on S1. Mild retrolisthesis of L2 on L3 and L3 on L4. Dextroscoliosis centered at the L3-L4 level. IMPRESSION: 1. An 11 x 9 cm heterogeneous solid and cystic mass arises from the uterine fundus and is noted to invade the urinary bladder dome wall as well as the distal sigmoid colon. No associated bowel obstruction; however, constipation proximal to irregular bowel wall thickening/mass. No associated stercoral colitis. Finding consistent with malignancy. Recommend gynecologic consultation. When the patient is clinically stable and able to follow directions and hold their breath (preferably as an outpatient) further evaluation with dedicated MRI with and without contrast should be considered. 2. Indeterminate 0.9 cm  left hepatic lobe hypodense lesion with a density of 77 HU. Question metastasis versus  primary hepatic lesion. 3. Stool throughout the colon 4. Colonic diverticulosis with no acute diverticulitis. 5. 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 6.  Aortic Atherosclerosis (ICD10-I70.0)-severe. These results will be called to the ordering clinician or representative by the Radiologist Assistant, and communication documented in the PACS or [employer]. Electronically Signed   By: Tish Frederickson M.D.   On: 10/26/2023 18:42   DG BONE DENSITY (DXA)  Result Date: 10/26/2023 EXAM: DUAL X-RAY ABSORPTIOMETRY (DXA) FOR BONE MINERAL DENSITY IMPRESSION: Referring Physician:  Henrine Screws Your patient completed a bone mineral density test using [employer] [employer] iDXA system (analysis version: 16). Technologist: BEC PATIENT: Name: Areanna, Lori Conrad Patient ID: [id number] Birth Date: 01-07-1946 Height: 60.5 in. Sex: Female Measured: 10/26/2023 Weight: 110.2 lbs. Indications: Advanced Age, Caucasian, Estrogen Deficient, Height Loss (781.91), History of Osteoporosis, Levothyroxine, Postmenopausal Fractures: Left Ankle Treatments: Calcium (E943.0), Vitamin D (E933.5) ASSESSMENT: The BMD measured at Forearm Radius 33% is 0.624 g/cm2 with a T-score of -2.9. This patient's diagnostic category is OSTEOPOROSIS according to World Health Organization [facility]) criteria. Comparison to 10/16/2019. Since the prior study, there has been a SIGNIFICANT DECREASE in bone mineral density of the hips (-4.6%). The lumbar spine was excluded due to being excluded from prior exam. The quality of the exam is good. Site Region Measured Date Measured Age YA BMD Significant CHANGE T-score Right Forearm Radius 33% 10/26/2023 77.0 -2.9 0.624 g/cm2 Right Forearm Radius 33% 10/16/2019 74.0 -2.6 0.653 g/cm2 DualFemur Neck Left 10/26/2023 77.0 -0.6 0.952 g/cm2 DualFemur Neck Left 10/16/2019 74.0 -0.5 0.962 g/cm2 DualFemur Total Mean 10/26/2023 77.0 -0.7 0.918 g/cm2 * DualFemur Total Mean 10/16/2019 74.0 -0.4 0.962 g/cm2 World Health Organization  [facility]) criteria for post-menopausal, Caucasian Women: Normal       T-score at or above -1 SD Osteopenia   T-score between -1 and -2.5 SD Osteoporosis T-score at or below -2.5 SD RECOMMENDATION: 1. All patients should optimize calcium and vitamin D intake. 2. Consider FDA-approved medical therapies in postmenopausal women and men aged 7 years and older, based on the following: a. A hip or vertebral (clinical or morphometric) fracture. b. T-score = -2.5 at the femoral neck or spine after appropriate evaluation to exclude secondary causes. c. Low bone mass (T-score between -1.0 and -2.5 at the femoral neck or spine) and a 10-year probability of a hip fracture = 3% or a 10-year probability of a major osteoporosis-related fracture = 20% based on the [place]-adapted WHO algorithm. d. Clinician judgment and/or patient preferences may indicate treatment for people with 10-year fracture probabilities above or below these levels. FOLLOW-UP: Patients with diagnosis of osteoporosis or at high risk for fracture should have regular bone mineral density tests.? Patients eligible for Medicare are allowed routine testing every 2 years.? The testing frequency can be increased to one year for patients who have rapidly progressing disease, are receiving or discontinuing medical therapy to restore bone mass, or have additional risk factors. I have reviewed this study and agree with the findings. [facility] Electronically Signed   By: Harmon Pier M.D.   On: 10/26/2023 12:30      11/08/2023 Pathology Results   SURGICAL PATHOLOGY CASE: [id number] PATIENT: [facility] Surgical Pathology Report  Clinical History: Pelvic mass, suspected malignancy (crm)   FINAL MICROSCOPIC DIAGNOSIS:  A. PELVIC SIDEWALL NODULE, RIGHT, EXCISION: - High-grade serous carcinoma, see comment  B. ABDOMINAL WALL #1, ANTERIOR, EXCISION: - High-grade serous  carcinoma  COMMENT:  A.  Immunohistochemical stain show that the tumor  cells are positive for CK7, PAX8, and p16 (diffuse overexpression).  Immunostain for p53 shows a clonal null expression pattern.  Immunostains for CK20 is negative. This immunoprofile is consistent with the above interpretation and suggestive of an ovarian primary.    11/14/2023 Initial Diagnosis   Malignant ovarian neoplasm, right (HCC)   11/14/2023 Cancer Staging   Staging form: Ovary, Fallopian Tube, and Primary Peritoneal Carcinoma, AJCC 8th Edition - Clinical stage from 11/14/2023: FIGO Stage IIIC (cT3c, cN1, cM0) - Signed by Artis Delay, MD on 11/14/2023 Stage prefix: Initial diagnosis   11/20/2023 Tumor Marker   Patient's tumor was tested for the following markers: CA-125. Results of the tumor marker test revealed 169.   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  No other deleterious variants detected in [facility] CancerNext-Expanded +RNAinsight Panel.  Report date is 12/26/2023.   The CancerNext-Expanded gene panel offered by [facility] and includes sequencing, rearrangement, and RNA analysis for the following 76 genes: AIP, ALK, APC, ATM, AXIN2, BAP1, BARD1, BMPR1A, BRCA1, BRCA2, BRIP1, CDC73, CDH1, CDK4, CDKN1B, CDKN2A, CEBPA, CHEK2, CTNNA1, DDX41, DICER1, ETV6, FH, FLCN, GATA2, LZTR1, MAX, MBD4, MEN1, MET, MLH1, MSH2, MSH3, MSH6, MUTYH, NF1, NF2, NTHL1, PALB2, PHOX2B, PMS2, POT1, PRKAR1A, PTCH1, PTEN, RAD51C, RAD51D, RB1, RET, RUNX1, SDHA, SDHAF2, SDHB, SDHC, SDHD, SMAD4, SMARCA4, SMARCB1, SMARCE1, STK11, SUFU, TMEM127, TP53, TSC1, TSC2, VHL, and WT1 (sequencing and deletion/duplication); EGFR, HOXB13, KIT, MITF, PDGFRA, POLD1, and POLE (sequencing only); EPCAM and GREM1 (deletion/duplication only).     01/05/2024 Tumor Marker   Patient's tumor was tested for the following markers:  CA-125. Results of the tumor marker test revealed 130.   01/18/2024 Imaging   CT ABDOMEN PELVIS W CONTRAST Result Date: 01/24/2024 CLINICAL DATA:  78 year old female with history of ovarian cancer status post chemotherapy. Follow-up study. * Tracking Code: BO * EXAM: CT ABDOMEN AND PELVIS WITH CONTRAST TECHNIQUE: Multidetector CT imaging of the abdomen and pelvis was performed using the standard protocol following bolus administration of intravenous contrast. RADIATION DOSE REDUCTION: This exam was performed according to the departmental dose-optimization program which includes automated exposure control, adjustment of the mA and/or kV according to patient size and/or use of iterative reconstruction technique. CONTRAST:  80mL OMNIPAQUE IOHEXOL 300 MG/ML  SOLN COMPARISON:  CT of the abdomen and pelvis 12/07/2023. FINDINGS: Lower chest: 2.8 x 0.7 cm new area of pleural-based nodularity in the right lower lobe (axial image 5 of series 2) concerning for probable metastatic lesion. Hepatobiliary: Multiple well-defined low-attenuation lesions scattered throughout the liver, largest of which are all compatible with simple cysts, measuring up to 4.4 x 2.7 cm in segment 4A. Multiple other subcentimeter low-attenuation lesions, too small to definitively characterize, but similar to the prior study and statistically likely to represent tiny cysts and/or biliary hamartomas. Gallbladder is unremarkable in appearance. Pancreas: No pancreatic mass. No pancreatic ductal dilatation. No pancreatic or peripancreatic fluid collections or inflammatory changes. Spleen: Unremarkable. Adrenals/Urinary Tract: Bilateral kidneys and bilateral adrenal glands are normal in appearance. No hydroureteronephrosis. Urinary bladder is unremarkable in appearance. Stomach/Bowel: Appearance of the stomach is normal. There is no pathologic dilatation of small bowel or colon. Numerous colonic diverticula are noted, without definite focal surrounding  inflammatory changes to indicate  an acute diverticulitis at this time. Previously noted mass in the sigmoid colon is not readily apparent on today's examination. The appendix is not confidently identified and may be surgically absent. Regardless, there are no inflammatory changes noted adjacent to the cecum to suggest the presence of an acute appendicitis at this time. Vascular/Lymphatic: Atherosclerotic calcifications are noted throughout the abdominal aorta and pelvic vasculature. Retroaortic left renal vein (normal anatomical variant) incidentally noted. No definite lymphadenopathy confidently identified in the abdomen or pelvis. Reproductive: Status post hysterectomy. Left ovary is unremarkable in appearance. Right adnexal mass has regressed when compared to the prior examination, currently measuring 7.8 x 7.4 x 7.7 cm (axial image 51 of series 2 and coronal image 51 of series 5). Other: Previously noted peritoneal implants in the low pelvis appear to have regressed compared to the prior examination. No new peritoneal implants confidently identified. No significant volume of ascites. No pneumoperitoneum. Musculoskeletal: There are no aggressive appearing lytic or blastic lesions noted in the visualized portions of the skeleton. IMPRESSION: 1. Today's study demonstrates a positive response to therapy with regression of previously noted right adnexal mass and peritoneal implants in the low pelvis. 2. However, there is a new pleural-based nodule in the right lower lobe concerning for a metastatic lesion. 3. Colonic diverticulosis without evidence of acute diverticulitis at this time. 4. Aortic atherosclerosis. Aortic Atherosclerosis (ICD10-I70.0). Electronically Signed   By: Trudie Reed M.D.   On: 01/24/2024 10:40        PHYSICAL EXAMINATION: ECOG PERFORMANCE STATUS: 1 - Symptomatic but completely ambulatory  Vitals:   01/25/24 1317  BP: (!) 147/69  Pulse: 80  Resp: 18  Temp: 99.1 F (37.3 C)   SpO2: 100%   Filed Weights   01/25/24 1317  Weight: 110 lb 9.6 oz (50.2 kg)    GENERAL:alert, no distress and comfortable NEURO: alert & oriented x 3 with fluent speech, no focal motor/sensory deficits  LABORATORY DATA:  I have reviewed the data as listed    Component Value Date/Time   NA 141 01/25/2024 1253   K 3.9 01/25/2024 1253   CL 107 01/25/2024 1253   CO2 31 01/25/2024 1253   GLUCOSE 155 (H) 01/25/2024 1253   BUN 20 01/25/2024 1253   CREATININE 0.60 01/25/2024 1253   CALCIUM 9.3 01/25/2024 1253   PROT 6.3 (L) 01/25/2024 1253   ALBUMIN 4.0 01/25/2024 1253   AST 17 01/25/2024 1253   ALT 13 01/25/2024 1253   ALKPHOS 80 01/25/2024 1253   BILITOT 0.5 01/25/2024 1253   GFRNONAA >60 01/25/2024 1253    No results found for: "SPEP", "UPEP"  Lab Results  Component Value Date   WBC 4.0 01/25/2024   NEUTROABS 2.4 01/25/2024   HGB 13.6 01/25/2024   HCT 42.6 01/25/2024   MCV 91.2 01/25/2024   PLT 156 01/25/2024      Chemistry      Component Value Date/Time   NA 141 01/25/2024 1253   K 3.9 01/25/2024 1253   CL 107 01/25/2024 1253   CO2 31 01/25/2024 1253   BUN 20 01/25/2024 1253   CREATININE 0.60 01/25/2024 1253      Component Value Date/Time   CALCIUM 9.3 01/25/2024 1253   ALKPHOS 80 01/25/2024 1253   AST 17 01/25/2024 1253   ALT 13 01/25/2024 1253   BILITOT 0.5 01/25/2024 1253       RADIOGRAPHIC STUDIES: I have reviewed multiple imaging studies with the patient I have personally reviewed the radiological images as listed and  agreed with the findings in the report. CT ABDOMEN PELVIS W CONTRAST Result Date: 01/24/2024 CLINICAL DATA:  78 year old female with history of ovarian cancer status post chemotherapy. Follow-up study. * Tracking Code: BO * EXAM: CT ABDOMEN AND PELVIS WITH CONTRAST TECHNIQUE: Multidetector CT imaging of the abdomen and pelvis was performed using the standard protocol following bolus administration of intravenous contrast. RADIATION  DOSE REDUCTION: This exam was performed according to the departmental dose-optimization program which includes automated exposure control, adjustment of the mA and/or kV according to patient size and/or use of iterative reconstruction technique. CONTRAST:  80mL OMNIPAQUE IOHEXOL 300 MG/ML  SOLN COMPARISON:  CT of the abdomen and pelvis 12/07/2023. FINDINGS: Lower chest: 2.8 x 0.7 cm new area of pleural-based nodularity in the right lower lobe (axial image 5 of series 2) concerning for probable metastatic lesion. Hepatobiliary: Multiple well-defined low-attenuation lesions scattered throughout the liver, largest of which are all compatible with simple cysts, measuring up to 4.4 x 2.7 cm in segment 4A. Multiple other subcentimeter low-attenuation lesions, too small to definitively characterize, but similar to the prior study and statistically likely to represent tiny cysts and/or biliary hamartomas. Gallbladder is unremarkable in appearance. Pancreas: No pancreatic mass. No pancreatic ductal dilatation. No pancreatic or peripancreatic fluid collections or inflammatory changes. Spleen: Unremarkable. Adrenals/Urinary Tract: Bilateral kidneys and bilateral adrenal glands are normal in appearance. No hydroureteronephrosis. Urinary bladder is unremarkable in appearance. Stomach/Bowel: Appearance of the stomach is normal. There is no pathologic dilatation of small bowel or colon. Numerous colonic diverticula are noted, without definite focal surrounding inflammatory changes to indicate an acute diverticulitis at this time. Previously noted mass in the sigmoid colon is not readily apparent on today's examination. The appendix is not confidently identified and may be surgically absent. Regardless, there are no inflammatory changes noted adjacent to the cecum to suggest the presence of an acute appendicitis at this time. Vascular/Lymphatic: Atherosclerotic calcifications are noted throughout the abdominal aorta and pelvic  vasculature. Retroaortic left renal vein (normal anatomical variant) incidentally noted. No definite lymphadenopathy confidently identified in the abdomen or pelvis. Reproductive: Status post hysterectomy. Left ovary is unremarkable in appearance. Right adnexal mass has regressed when compared to the prior examination, currently measuring 7.8 x 7.4 x 7.7 cm (axial image 51 of series 2 and coronal image 51 of series 5). Other: Previously noted peritoneal implants in the low pelvis appear to have regressed compared to the prior examination. No new peritoneal implants confidently identified. No significant volume of ascites. No pneumoperitoneum. Musculoskeletal: There are no aggressive appearing lytic or blastic lesions noted in the visualized portions of the skeleton. IMPRESSION: 1. Today's study demonstrates a positive response to therapy with regression of previously noted right adnexal mass and peritoneal implants in the low pelvis. 2. However, there is a new pleural-based nodule in the right lower lobe concerning for a metastatic lesion. 3. Colonic diverticulosis without evidence of acute diverticulitis at this time. 4. Aortic atherosclerosis. Aortic Atherosclerosis (ICD10-I70.0). Electronically Signed   By: Trudie Reed M.D.   On: 01/24/2024 10:40

## 2024-01-26 ENCOUNTER — Inpatient Hospital Stay: Payer: Medicare HMO

## 2024-01-26 VITALS — BP 158/98 | HR 81 | Temp 97.8°F | Resp 16

## 2024-01-26 DIAGNOSIS — Z1509 Genetic susceptibility to other malignant neoplasm: Secondary | ICD-10-CM | POA: Diagnosis not present

## 2024-01-26 DIAGNOSIS — Z5111 Encounter for antineoplastic chemotherapy: Secondary | ICD-10-CM | POA: Diagnosis not present

## 2024-01-26 DIAGNOSIS — C561 Malignant neoplasm of right ovary: Secondary | ICD-10-CM | POA: Diagnosis not present

## 2024-01-26 DIAGNOSIS — Z1502 Genetic susceptibility to malignant neoplasm of ovary: Secondary | ICD-10-CM | POA: Diagnosis not present

## 2024-01-26 DIAGNOSIS — R918 Other nonspecific abnormal finding of lung field: Secondary | ICD-10-CM | POA: Diagnosis not present

## 2024-01-26 LAB — CA 125: Cancer Antigen (CA) 125: 60.6 U/mL — ABNORMAL HIGH (ref 0.0–38.1)

## 2024-01-26 MED ORDER — SODIUM CHLORIDE 0.9 % IV SOLN
INTRAVENOUS | Status: DC
Start: 2024-01-26 — End: 2024-01-26

## 2024-01-26 MED ORDER — PALONOSETRON HCL INJECTION 0.25 MG/5ML
0.2500 mg | Freq: Once | INTRAVENOUS | Status: AC
Start: 2024-01-26 — End: 2024-01-26
  Administered 2024-01-26: 0.25 mg via INTRAVENOUS
  Filled 2024-01-26: qty 5

## 2024-01-26 MED ORDER — FAMOTIDINE IN NACL 20-0.9 MG/50ML-% IV SOLN
20.0000 mg | Freq: Once | INTRAVENOUS | Status: AC
  Administered 2024-01-26: 20 mg via INTRAVENOUS
  Filled 2024-01-26: qty 50

## 2024-01-26 MED ORDER — SODIUM CHLORIDE 0.9% FLUSH
10.0000 mL | INTRAVENOUS | Status: DC | PRN
  Administered 2024-01-26: 10 mL

## 2024-01-26 MED ORDER — CETIRIZINE HCL 10 MG/ML IV SOLN
10.0000 mg | Freq: Once | INTRAVENOUS | Status: AC
  Administered 2024-01-26: 10 mg via INTRAVENOUS
  Filled 2024-01-26: qty 1

## 2024-01-26 MED ORDER — HEPARIN SOD (PORK) LOCK FLUSH 100 UNIT/ML IV SOLN
500.0000 [IU] | Freq: Once | INTRAVENOUS | Status: AC | PRN
  Administered 2024-01-26: 500 [IU]

## 2024-01-26 MED ORDER — CARBOPLATIN CHEMO INJECTION 450 MG/45ML
366.0000 mg | Freq: Once | INTRAVENOUS | Status: AC
  Administered 2024-01-26: 370 mg via INTRAVENOUS
  Filled 2024-01-26: qty 37

## 2024-01-26 MED ORDER — DEXAMETHASONE SODIUM PHOSPHATE 10 MG/ML IJ SOLN
10.0000 mg | Freq: Once | INTRAMUSCULAR | Status: AC
Start: 2024-01-26 — End: 2024-01-26
  Administered 2024-01-26: 10 mg via INTRAVENOUS
  Filled 2024-01-26: qty 1

## 2024-01-26 MED ORDER — SODIUM CHLORIDE 0.9 % IV SOLN
175.0000 mg/m2 | Freq: Once | INTRAVENOUS | Status: AC
  Administered 2024-01-26: 258 mg via INTRAVENOUS
  Filled 2024-01-26: qty 43

## 2024-01-26 MED ORDER — SODIUM CHLORIDE 0.9 % IV SOLN
150.0000 mg | Freq: Once | INTRAVENOUS | Status: AC
  Administered 2024-01-26: 150 mg via INTRAVENOUS
  Filled 2024-01-26: qty 150

## 2024-01-26 NOTE — Patient Instructions (Signed)
[facility] - A DEPT OF [facility]  Discharge Instructions: Thank you for choosing [facility] to provide your oncology and hematology care.   If you have a lab appointment with the Cancer Center, please go directly to the Cancer Center and check in at the registration area.   Wear comfortable clothing and clothing appropriate for easy access to any Portacath or PICC line.   We strive to give you quality time with your provider. You may need to reschedule your appointment if you arrive late (15 or more minutes).  Arriving late affects you and other patients whose appointments are after yours.  Also, if you miss three or more appointments without notifying the office, you may be dismissed from the clinic at the provider's discretion.      For prescription refill requests, have your pharmacy contact our office and allow 72 hours for refills to be completed.    Today you received the following chemotherapy and/or immunotherapy agents paclitaxel, carboplatin      To help prevent nausea and vomiting after your treatment, we encourage you to take your nausea medication as directed.  BELOW ARE SYMPTOMS THAT SHOULD BE REPORTED IMMEDIATELY: *FEVER GREATER THAN 100.4 F (38 C) OR HIGHER *CHILLS OR SWEATING *NAUSEA AND VOMITING THAT IS NOT CONTROLLED WITH YOUR NAUSEA MEDICATION *UNUSUAL SHORTNESS OF BREATH *UNUSUAL BRUISING OR BLEEDING *URINARY PROBLEMS (pain or burning when urinating, or frequent urination) *BOWEL PROBLEMS (unusual diarrhea, constipation, pain near the anus) TENDERNESS IN MOUTH AND THROAT WITH OR WITHOUT PRESENCE OF ULCERS (sore throat, sores in mouth, or a toothache) UNUSUAL RASH, SWELLING OR PAIN  UNUSUAL VAGINAL DISCHARGE OR ITCHING   Items with * indicate a potential emergency and should be followed up as soon as possible or go to the Emergency Department if any problems should occur.  Please show the CHEMOTHERAPY ALERT CARD or  IMMUNOTHERAPY ALERT CARD at check-in to the Emergency Department and triage nurse.  Should you have questions after your visit or need to cancel or reschedule your appointment, please contact [facility] - A DEPT OF [facility]  Dept: [phone]  and follow the prompts.  Office hours are 8:00 a.m. to 4:30 p.m. Monday - Friday. Please note that voicemails left after 4:00 p.m. may not be returned until the following business day.  We are closed weekends and major holidays. You have access to a nurse at all times for urgent questions. Please call the main number to the clinic Dept: [phone] and follow the prompts.   For any non-urgent questions, you may also contact your provider using MyChart. We now offer e-Visits for anyone 44 and older to request care online for non-urgent symptoms. For details visit [email or web address].   Also download the MyChart app! Go to the app store, search "MyChart", open the app, select [facility], and log in with your MyChart username and password.

## 2024-01-27 ENCOUNTER — Other Ambulatory Visit: Payer: Self-pay

## 2024-01-29 ENCOUNTER — Telehealth: Payer: Self-pay | Admitting: Oncology

## 2024-01-29 ENCOUNTER — Encounter: Payer: Self-pay | Admitting: Oncology

## 2024-01-29 DIAGNOSIS — C561 Malignant neoplasm of right ovary: Secondary | ICD-10-CM

## 2024-01-29 NOTE — Telephone Encounter (Signed)
Called Lori Conrad and advised her of the CT chest appointment on 02/03/24 at 1:00 with arrival at 12:45 to [facility].  Let her know to check in at the [facility] ER.  Also called and left JJ (son) a message with appointment details.

## 2024-01-31 ENCOUNTER — Telehealth: Payer: Self-pay | Admitting: Oncology

## 2024-01-31 NOTE — Telephone Encounter (Signed)
Cordelia Pen called and wanted to review the plan/dates for surgery.  Advised her that she is scheduled to see Dr. Alvester Morin on 3/3 to discuss surgery and that we are tentatively holding 02/20/24 as a surgery date.  She verbalized understanding and agreement.

## 2024-02-03 ENCOUNTER — Ambulatory Visit (HOSPITAL_COMMUNITY)
Admission: RE | Admit: 2024-02-03 | Discharge: 2024-02-03 | Disposition: A | Discharge status: Home / Self Care | Payer: Medicare HMO | Source: Ambulatory Visit | Attending: Hematology and Oncology

## 2024-02-03 DIAGNOSIS — C561 Malignant neoplasm of right ovary: Secondary | ICD-10-CM | POA: Insufficient documentation

## 2024-02-03 DIAGNOSIS — C569 Malignant neoplasm of unspecified ovary: Secondary | ICD-10-CM | POA: Diagnosis not present

## 2024-02-03 DIAGNOSIS — R918 Other nonspecific abnormal finding of lung field: Secondary | ICD-10-CM | POA: Diagnosis not present

## 2024-02-03 DIAGNOSIS — R911 Solitary pulmonary nodule: Secondary | ICD-10-CM | POA: Diagnosis not present

## 2024-02-03 MED ORDER — IOHEXOL 300 MG/ML  SOLN
75.0000 mL | Freq: Once | INTRAMUSCULAR | Status: AC | PRN
  Administered 2024-02-03: 75 mL via INTRAVENOUS

## 2024-02-05 ENCOUNTER — Telehealth: Payer: Self-pay

## 2024-02-05 DIAGNOSIS — Z719 Counseling, unspecified: Secondary | ICD-10-CM

## 2024-02-05 NOTE — Telephone Encounter (Signed)
 Returned her call and told her yes she can have flu vaccine per Dr. Bertis Ruddy. She verbalized understanding,.

## 2024-02-07 ENCOUNTER — Encounter: Payer: Self-pay | Admitting: Hematology and Oncology

## 2024-02-07 ENCOUNTER — Telehealth: Payer: Self-pay | Admitting: Oncology

## 2024-02-07 DIAGNOSIS — E039 Hypothyroidism, unspecified: Secondary | ICD-10-CM | POA: Diagnosis not present

## 2024-02-07 DIAGNOSIS — E782 Mixed hyperlipidemia: Secondary | ICD-10-CM | POA: Diagnosis not present

## 2024-02-07 NOTE — Telephone Encounter (Signed)
 Called Lori Conrad and let her know that we are tentatively scheduling surgery for 02/20/24 and that [facility] preadmission testing will be calling her to schedule an appointment.  Let her know to schedule it after she sees Dr. Alvester Morin on 02/12/24. She verbalized understanding and agreement.

## 2024-02-08 ENCOUNTER — Other Ambulatory Visit: Payer: Self-pay

## 2024-02-08 DIAGNOSIS — D509 Iron deficiency anemia, unspecified: Secondary | ICD-10-CM | POA: Diagnosis not present

## 2024-02-08 DIAGNOSIS — F5101 Primary insomnia: Secondary | ICD-10-CM | POA: Diagnosis not present

## 2024-02-08 DIAGNOSIS — K5900 Constipation, unspecified: Secondary | ICD-10-CM | POA: Diagnosis not present

## 2024-02-08 DIAGNOSIS — E782 Mixed hyperlipidemia: Secondary | ICD-10-CM | POA: Diagnosis not present

## 2024-02-08 DIAGNOSIS — Z131 Encounter for screening for diabetes mellitus: Secondary | ICD-10-CM | POA: Diagnosis not present

## 2024-02-08 DIAGNOSIS — C561 Malignant neoplasm of right ovary: Secondary | ICD-10-CM | POA: Diagnosis not present

## 2024-02-08 DIAGNOSIS — E039 Hypothyroidism, unspecified: Secondary | ICD-10-CM | POA: Diagnosis not present

## 2024-02-12 ENCOUNTER — Inpatient Hospital Stay: Payer: Medicare HMO | Attending: Psychiatry | Admitting: Psychiatry

## 2024-02-12 ENCOUNTER — Inpatient Hospital Stay: Payer: Medicare HMO | Admitting: Gynecologic Oncology

## 2024-02-12 ENCOUNTER — Encounter: Payer: Self-pay | Admitting: Hematology and Oncology

## 2024-02-12 ENCOUNTER — Encounter: Payer: Self-pay | Admitting: Psychiatry

## 2024-02-12 VITALS — BP 124/74 | HR 87 | Temp 98.8°F | Resp 18 | Wt 109.2 lb

## 2024-02-12 DIAGNOSIS — Z90722 Acquired absence of ovaries, bilateral: Secondary | ICD-10-CM | POA: Diagnosis not present

## 2024-02-12 DIAGNOSIS — Z90710 Acquired absence of both cervix and uterus: Secondary | ICD-10-CM | POA: Diagnosis not present

## 2024-02-12 DIAGNOSIS — C561 Malignant neoplasm of right ovary: Secondary | ICD-10-CM | POA: Diagnosis not present

## 2024-02-12 DIAGNOSIS — Z9221 Personal history of antineoplastic chemotherapy: Secondary | ICD-10-CM | POA: Diagnosis not present

## 2024-02-12 DIAGNOSIS — Z7189 Other specified counseling: Secondary | ICD-10-CM | POA: Diagnosis not present

## 2024-02-12 DIAGNOSIS — Z9079 Acquired absence of other genital organ(s): Secondary | ICD-10-CM | POA: Diagnosis not present

## 2024-02-12 MED ORDER — POLYETHYLENE GLYCOL 3350 17 GM/SCOOP PO POWD: 1.0000 | Freq: Once | ORAL | 0 refills | Status: AC

## 2024-02-12 MED ORDER — BISACODYL 5 MG PO TBEC: DELAYED_RELEASE_TABLET | ORAL | 0 refills | Status: DC

## 2024-02-12 MED ORDER — POLYETHYLENE GLYCOL 3350 17 GM/SCOOP PO POWD: 1.0000 | Freq: Once | ORAL | 0 refills | Status: DC

## 2024-02-12 MED ORDER — METRONIDAZOLE 500 MG PO TABS: ORAL_TABLET | ORAL | 0 refills | Status: DC

## 2024-02-12 NOTE — Progress Notes (Signed)
 Patient here for a pre-operative appointment prior to her scheduled surgery on 02/20/2024. She is scheduled for a diagnostic laparoscopy, robotic assisted laparoscopic vs open total hysterectomy, bilateral salpingo-oophorectomy, omentectomy, debulking, possible bowel surgery, possible ostomy. The surgery was discussed in detail.  See after visit summary for additional details. Visual aids used to discuss items related to surgery including the incentive spirometer, sequential compression stockings and foley catheter.   Discussed post-op pain management in detail including the aspects of the enhanced recovery pathway.  Advised her that a new prescription would be sent in for Tramadol and it is only to be used for after her upcoming surgery.  We discussed the use of tylenol post-op and to monitor for a maximum of 4,000 mg in a 24 hour period.  Also prescribed sennakot to be used after surgery and to hold if having loose stools.  Discussed bowel prep and regimen in detail.     Discussed the use of SCDs and measures to take at home to prevent DVT including frequent mobility.  Reportable signs and symptoms of DVT discussed. Post-operative instructions discussed and expectations for after surgery. Incisional care discussed as well including reportable signs and symptoms including erythema, drainage, wound separation.     30 minutes spent with the patient.  Verbalizing understanding of material discussed. No needs or concerns voiced at the end of the visit.   Advised patient to call for any needs.  Advised that her post-operative medications had been prescribed and could be picked up at any time.    This appointment is included in the global surgical bundle as pre-operative teaching and has no charge.

## 2024-02-12 NOTE — Patient Instructions (Signed)
 Preparing for your Surgery  Plan for surgery on February 20, 2024 with Dr. Clide Cliff at [facility]. You will be scheduled for diagnostic laparoscopy (looking into the abdomen through a small incision with a camera), robotic assisted laparoscopic vs open total hysterectomy, bilateral salpingo-oophorectomy, omentectomy, debulking, possible bowel surgery, possible ostomy.   Pre-operative Testing -You will receive a phone call from presurgical testing at [facility] to arrange for a pre-operative appointment and lab work.  -Bring your insurance card, copy of an advanced directive if applicable, medication list  -At that visit, you will be asked to sign a consent for a possible blood transfusion in case a transfusion becomes necessary during surgery.  The need for a blood transfusion is rare but having consent is a necessary part of your care.     -You should not be taking blood thinners or aspirin at least ten days prior to surgery unless instructed by your surgeon.  -Do not take supplements such as fish oil (omega 3), red yeast rice, turmeric before your surgery. STOP TAKING AT LEAST 10 DAYS BEFORE SURGERY. You want to avoid medications with aspirin in them including headache powders such as BC or Goody's), Excedrin migraine.  YOU WILL NEED TO HOLD ON TAKING MOBIC FOR AT LEAST 10 DAYS BEFORE SURGERY.  Day Before Surgery at Home -You will be asked to take in a light diet the day before surgery. You will be advised you can have clear liquids up until 3 hours before your surgery.    Eat a light diet the day before surgery.  Examples including soups, broths, toast, yogurt, mashed potatoes.  AVOID GAS PRODUCING FOODS AND BEVERAGES. Things to avoid include carbonated beverages (fizzy beverages, sodas), raw fruits and raw vegetables (uncooked), or beans.   If your bowels are filled with gas, your surgeon will have difficulty visualizing your pelvic organs which increases your  surgical risks.  Your role in recovery Your role is to become active as soon as directed by your doctor, while still giving yourself time to heal.  Rest when you feel tired. You will be asked to do the following in order to speed your recovery:  - Cough and breathe deeply. This helps to clear and expand your lungs and can prevent pneumonia after surgery.  - STAY ACTIVE WHEN YOU GET HOME. Do mild physical activity. Walking or moving your legs help your circulation and body functions return to normal. Do not try to get up or walk alone the first time after surgery.   -If you develop swelling on one leg or the other, pain in the back of your leg, redness/warmth in one of your legs, please call the office or go to the Emergency Room to have a doppler to rule out a blood clot. For shortness of breath, chest pain-seek care in the Emergency Room as soon as possible. - Actively manage your pain. Managing your pain lets you move in comfort. We will ask you to rate your pain on a scale of zero to 10. It is your responsibility to tell your doctor or nurse where and how much you hurt so your pain can be treated.  Special Considerations -If you are diabetic, you may be placed on insulin after surgery to have closer control over your blood sugars to promote healing and recovery.  This does not mean that you will be discharged on insulin.  If applicable, your oral antidiabetics will be resumed when you are tolerating a solid diet.  -  Your final pathology results from surgery should be available around one week after surgery and the results will be relayed to you when available.  -FMLA forms can be faxed to [phone] and please allow 5-7 business days for completion.  Pain Management After Surgery -You will be prescribed your pain medication and bowel regimen medications before surgery so that you can have these available when you are discharged from the hospital. The pain medication is for use ONLY AFTER  surgery and a new prescription will not be given.   -Make sure that you have Tylenol and Ibuprofen IF YOU ARE ABLE TO TAKE THESE MEDICATIONS at home to use on a regular basis after surgery for pain control. We recommend alternating the medications every hour to six hours since they work differently and are processed in the body differently for pain relief.  -Review the attached handout on narcotic use and their risks and side effects.   Bowel Regimen -You will be prescribed Sennakot-S to take nightly to prevent constipation especially if you are taking the narcotic pain medication intermittently.  It is important to prevent constipation and drink adequate amounts of liquids. You can stop taking this medication when you are not taking pain medication and you are back on your normal bowel routine.  Risks of Surgery Risks of surgery are low but include bleeding, infection, damage to surrounding structures, re-operation, blood clots, and very rarely death.   Blood Transfusion Information (For the consent to be signed before surgery)  We will be checking your blood type before surgery so in case of emergencies, we will know what type of blood you would need.                                            WHAT IS A BLOOD TRANSFUSION?  A transfusion is the replacement of blood or some of its parts. Blood is made up of multiple cells which provide different functions. Red blood cells carry oxygen and are used for blood loss replacement. White blood cells fight against infection. Platelets control bleeding. Plasma helps clot blood. Other blood products are available for specialized needs, such as hemophilia or other clotting disorders. BEFORE THE TRANSFUSION  Who gives blood for transfusions?  You may be able to donate blood to be used at a later date on yourself (autologous donation). Relatives can be asked to donate blood. This is generally not any safer than if you have received blood from a  stranger. The same precautions are taken to ensure safety when a relative's blood is donated. Healthy volunteers who are fully evaluated to make sure their blood is safe. This is blood bank blood. Transfusion therapy is the safest it has ever been in the practice of medicine. Before blood is taken from a donor, a complete history is taken to make sure that person has no history of diseases nor engages in risky social behavior (examples are intravenous drug use or sexual activity with multiple partners). The donor's travel history is screened to minimize risk of transmitting infections, such as malaria. The donated blood is tested for signs of infectious diseases, such as HIV and hepatitis. The blood is then tested to be sure it is compatible with you in order to minimize the chance of a transfusion reaction. If you or a relative donates blood, this is often done in anticipation of surgery and is not  appropriate for emergency situations. It takes many days to process the donated blood. RISKS AND COMPLICATIONS Although transfusion therapy is very safe and saves many lives, the main dangers of transfusion include:  Getting an infectious disease. Developing a transfusion reaction. This is an allergic reaction to something in the blood you were given. Every precaution is taken to prevent this. The decision to have a blood transfusion has been considered carefully by your caregiver before blood is given. Blood is not given unless the benefits outweigh the risks.  AFTER SURGERY INSTRUCTIONS  Return to work: 4-6 weeks if applicable  You may have a white honeycomb dressing over your larger incision. This dressing can be removed 5 days after surgery and you do not need to reapply a new dressing. Once you remove the dressing, you will notice that you have the surgical glue (dermabond) on the incision and this will peel off on its own. You can get this dressing wet in the shower the days after surgery prior to  removal on the 5th day.   Activity: 1. Be up and out of the bed during the day.  Take a nap if needed.  You may walk up steps but be careful and use the hand rail.  Stair climbing will tire you more than you think, you may need to stop part way and rest.   2. No lifting or straining for 6 weeks over 10 pounds. No pushing, pulling, straining for 6 weeks.  3. No driving for 6-21 days when the following criteria have been met: Do not drive if you are taking narcotic pain medicine and make sure that your reaction time has returned.   4. You can shower as soon as the next day after surgery. Shower daily.  Use your regular soap and water (not directly on the incision) and pat your incision(s) dry afterwards; don't rub.  No tub baths or submerging your body in water until cleared by your surgeon. If you have the soap that was given to you by pre-surgical testing that was used before surgery, you do not need to use it afterwards because this can irritate your incisions.   5. No sexual activity and nothing in the vagina for 10-12 weeks.  6. You may experience a small amount of clear drainage from your incisions, which is normal.  If the drainage persists, increases, or changes color please call the office.  7. Do not use creams, lotions, or ointments such as neosporin on your incisions after surgery until advised by your surgeon because they can cause removal of the dermabond glue on your incisions.    8. You may experience vaginal spotting after surgery or when the stitches at the top of the vagina begin to dissolve.  The spotting is normal but if you experience heavy bleeding, call our office.  9. Take Tylenol or ibuprofen first for pain if you are able to take these medications and only use narcotic pain medication for severe pain not relieved by the Tylenol or Ibuprofen.  Monitor your Tylenol intake to a max of 4,000 mg in a 24 hour period. You can alternate these medications after  surgery.  Diet: 1. Low sodium Heart Healthy Diet is recommended but you are cleared to resume your normal (before surgery) diet after your procedure.  2. It is safe to use a laxative, such as Miralax or Colace, if you have difficulty moving your bowels before surgery. You have been prescribed Sennakot-S to take at bedtime every evening after  surgery to keep bowel movements regular and to prevent constipation.    Wound Care: 1. Keep clean and dry.  Shower daily.  Reasons to call the Doctor: Fever - Oral temperature greater than 100.4 degrees Fahrenheit Foul-smelling vaginal discharge Difficulty urinating Nausea and vomiting Increased pain at the site of the incision that is unrelieved with pain medicine. Difficulty breathing with or without chest pain New calf pain especially if only on one side Sudden, continuing increased vaginal bleeding with or without clots.   Contacts: For questions or concerns you should contact:  Dr. Clide Cliff at [phone]  Warner Mccreedy, NP at [phone]  After Hours: call [phone] and have the GYN Oncologist paged/contacted (after 5 pm or on the weekends). You will speak with an after hours RN and let he or she know you have had surgery.  Messages sent via mychart are for non-urgent matters and are not responded to after hours so for urgent needs, please call the after hours number.

## 2024-02-12 NOTE — Patient Instructions (Addendum)
 Preparing for your Surgery   Plan for surgery on 02/20/2024 with Dr. Clide Cliff at [facility]. You will be scheduled for diagnostic laparoscopy (looking into the abdomen with a camera through a small incision), robotic assisted laparoscopic versus open total hysterectomy (removal of the uterus and cervix), bilateral salpingo-oophorectomy (removal of ovaries and fallopian tubes), omentectomy, debulking, possible bowel surgery including possible ostomy.    We also have you meet with the ostomy nurse before surgery to have a marking placed on your abdomen in case an ostomy is performed at the time of surgery.   Pre-operative Testing -You will receive a phone call from presurgical testing at [facility] to arrange for a pre-operative appointment and lab work.   -Bring your insurance card, copy of an advanced directive if applicable, medication list   -At that visit, you will be asked to sign a consent for a possible blood transfusion in case a transfusion becomes necessary during surgery.  The need for a blood transfusion is rare but having consent is a necessary part of your care.      -You should not be taking blood thinners or aspirin at least ten days prior to surgery unless instructed by your surgeon.   -Do not take supplements such as fish oil (omega 3), red yeast rice, turmeric before your surgery. STOP TAKING AT LEAST 10 DAYS BEFORE SURGERY. You want to avoid medications with aspirin in them including headache powders such as BC or Goody's), Excedrin migraine.   Day Before Surgery at Home -You have a BOWEL PREP the day before surgery. You will be advised you can have clear liquids up until 3 hours before your surgery.     AVOID GAS PRODUCING BEVERAGES. Things to avoid include carbonated beverages (fizzy beverages, sodas)   If your bowels are filled with gas, your surgeon will have difficulty visualizing your pelvic organs which increases your surgical risks.   Your  role in recovery Your role is to become active as soon as directed by your doctor, while still giving yourself time to heal.  Rest when you feel tired. You will be asked to do the following in order to speed your recovery:   - Cough and breathe deeply. This helps to clear and expand your lungs and can prevent pneumonia after surgery.  - STAY ACTIVE WHEN YOU GET HOME. Do mild physical activity. Walking or moving your legs help your circulation and body functions return to normal. Do not try to get up or walk alone the first time after surgery.   -If you develop swelling on one leg or the other, pain in the back of your leg, redness/warmth in one of your legs, please call the office or go to the Emergency Room to have a doppler to rule out a blood clot. For shortness of breath, chest pain-seek care in the Emergency Room as soon as possible. - Actively manage your pain. Managing your pain lets you move in comfort. We will ask you to rate your pain on a scale of zero to 10. It is your responsibility to tell your doctor or nurse where and how much you hurt so your pain can be treated.   Special Considerations -If you are diabetic, you may be placed on insulin after surgery to have closer control over your blood sugars to promote healing and recovery.  This does not mean that you will be discharged on insulin.  If applicable, your oral antidiabetics will be resumed when you are  tolerating a solid diet.   -Your final pathology results from surgery should be available around one week after surgery and the results will be relayed to you when available.   -Dr. Antionette Char is the surgeon that assists your GYN Oncologist with surgery.  If you end up staying the night, the next day after your surgery you will either see Dr. Pricilla Holm, Dr. Alvester Morin, or Dr. Antionette Char.   -FMLA forms can be faxed to [phone] and please allow 5-7 business days for completion.   Pain Management After Surgery -We will  send in medications for after surgery use.   -Make sure that you have Tylenol IF YOU ARE ABLE TO TAKE THESE MEDICATION at home to use on a regular basis after surgery for pain control.    -Review the attached handout on narcotic use and their risks and side effects.    Bowel Regimen -You will be prescribed Sennakot-S to take nightly to prevent constipation especially if you are taking the narcotic pain medication intermittently.  It is important to prevent constipation and drink adequate amounts of liquids. You can stop taking this medication when you are not taking pain medication and you are back on your normal bowel routine. IF YOU HAVE BOWEL SURGERY, THIS MAY BE ADJUSTED   Risks of Surgery Risks of surgery are low but include bleeding, infection, damage to surrounding structures, re-operation, blood clots, and very rarely death.     Blood Transfusion Information (For the consent to be signed before surgery)   We will be checking your blood type before surgery so in case of emergencies, we will know what type of blood you would need.                                             WHAT IS A BLOOD TRANSFUSION?   A transfusion is the replacement of blood or some of its parts. Blood is made up of multiple cells which provide different functions. Red blood cells carry oxygen and are used for blood loss replacement. White blood cells fight against infection. Platelets control bleeding. Plasma helps clot blood. Other blood products are available for specialized needs, such as hemophilia or other clotting disorders. BEFORE THE TRANSFUSION  Who gives blood for transfusions?  You may be able to donate blood to be used at a later date on yourself (autologous donation). Relatives can be asked to donate blood. This is generally not any safer than if you have received blood from a stranger. The same precautions are taken to ensure safety when a relative's blood is donated. Healthy volunteers who are  fully evaluated to make sure their blood is safe. This is blood bank blood. Transfusion therapy is the safest it has ever been in the practice of medicine. Before blood is taken from a donor, a complete history is taken to make sure that person has no history of diseases nor engages in risky social behavior (examples are intravenous drug use or sexual activity with multiple partners). The donor's travel history is screened to minimize risk of transmitting infections, such as malaria. The donated blood is tested for signs of infectious diseases, such as HIV and hepatitis. The blood is then tested to be sure it is compatible with you in order to minimize the chance of a transfusion reaction. If you or a relative donates blood, this is often done in  anticipation of surgery and is not appropriate for emergency situations. It takes many days to process the donated blood. RISKS AND COMPLICATIONS Although transfusion therapy is very safe and saves many lives, the main dangers of transfusion include:  Getting an infectious disease. Developing a transfusion reaction. This is an allergic reaction to something in the blood you were given. Every precaution is taken to prevent this. The decision to have a blood transfusion has been considered carefully by your caregiver before blood is given. Blood is not given unless the benefits outweigh the risks.   AFTER SURGERY INSTRUCTIONS   Return to work: 4-6 weeks if applicable   You may have a white honeycomb dressing over your larger incision if you have open surgery. This dressing can be removed 5 days after surgery and you do not need to reapply a new dressing. Once you remove the dressing, you will notice that you have the surgical glue (dermabond) on the incision and this will peel off on its own. You can get this dressing wet in the shower the days after surgery prior to removal on the 5th day.    You will need to be on a blood thinner after surgery to prevent blood  clots for 2 weeks versus 4 weeks based on the surgery being through small incisions or a large incision. This can be given in pill form or injections.    AVOID USE OF NSAIDS (IBUPROFEN, NAPROXEN) WHILE TAKING THE BLOOD THINNER.    Activity: 1. Be up and out of the bed during the day.  Take a nap if needed.  You may walk up steps but be careful and use the hand rail.  Stair climbing will tire you more than you think, you may need to stop part way and rest.    2. No lifting or straining for 6 weeks over 10 pounds. No pushing, pulling, straining for 6 weeks.   3. No driving for 6-21 days when the following criteria have been met: Do not drive if you are taking narcotic pain medicine and make sure that your reaction time has returned.    4. You can shower as soon as the next day after surgery. Shower daily.  Use your regular soap and water (not directly on the incision) and pat your incision(s) dry afterwards; don't rub.  No tub baths or submerging your body in water until cleared by your surgeon. If you have the soap that was given to you by pre-surgical testing that was used before surgery, you do not need to use it afterwards because this can irritate your incisions.    5. No sexual activity and nothing in the vagina for 12 weeks.   6. You may experience a small amount of clear drainage from your incisions, which is normal.  If the drainage persists, increases, or changes color please call the office.   7. Do not use creams, lotions, or ointments such as neosporin on your incisions after surgery until advised by your surgeon because they can cause removal of the dermabond glue on your incisions.     8. You may experience vaginal spotting after surgery or when the stitches at the top of the vagina begin to dissolve.  The spotting is normal but if you experience heavy bleeding, call our office.   9. Take Tylenol first for pain if you are able to take these medication and only use narcotic pain  medication for severe pain not relieved by the Tylenol.  Monitor your Tylenol  intake to a max of 4,000 mg in a 24 hour period.    Diet: 1. Low sodium Heart Healthy Diet is recommended but you are cleared to resume your normal (before surgery) diet after your procedure.   2. It is safe to use a laxative, such as Miralax or Colace, if you have difficulty moving your bowels before surgery. You will be prescribed Sennakot-S to take if constipated at bedtime every evening after surgery to keep bowel movements regular and to prevent constipation.    Wound Care: 1. Keep clean and dry.  Shower daily.   Reasons to call the Doctor: Fever - Oral temperature greater than 100.4 degrees Fahrenheit Foul-smelling vaginal discharge Difficulty urinating Nausea and vomiting Increased pain at the site of the incision that is unrelieved with pain medicine. Difficulty breathing with or without chest pain New calf pain especially if only on one side Sudden, continuing increased vaginal bleeding with or without clots.   Contacts: For questions or concerns you should contact:   Dr. Clide Cliff at [phone]   Warner Mccreedy, NP at [phone]   After Hours: call [phone] and have the GYN Oncologist paged/contacted (after 5 pm or on the weekends). You will speak with an after hours RN and let he or she know you have had surgery.   Messages sent via mychart are for non-urgent matters and are not responded to after hours so for urgent needs, please call the after hours number.   GYN Oncology Bowel Preparation for surgery   There are two important steps to take to prepare for your surgery:   1. Cleansing of your colon: all the stool is washed out of your colon.   2. Take antibiotics: taken by mouth to help prevent infection.   INSTRUCTIONS:   As Soon As Possible (at least 2-3 days BEFORE your surgery)   Please buy the following (5) items from a pharmacy:   The first item is an antibiotic.  The first four items will be sent in as prescriptions and you can get the Gatorade or powerade at the drug store or grocery store.    1. Flagyl pills - 1 gram, 3 doses (antibiotic)   The next 2 items are laxatives and work to cleanse your colon.   3. 2 Dulcolax 5 mg tablets   4. 238 grams of Miralax (8.3 ounce bottle)   5. 64 oz of Gatorade or Powerade (not red)   (NOTE: If you are allergic to any of these medications, the prep will NOT be prescribed for your. Please let your physician know of any allergies.)   The Day Before Your Surgery   1. Do NOT eat any solid food. Do NOT drink unfiltered juices such as apple cider. Drink only clear liquids such as juice, black coffee, tea, sports drinks, soda pop, Jell-O, water. Please refer to handout from the pre-care suite for more details).   2. Starting at 9:00 a.m.: Take 2 Dulcolax tablets with 2 glasses of clear liquid.   3. 11:00 a.m.: Mix whole bottle of Miralax in 64 oz of Gatorade and drink one 8 oz glass every 15 minutes until gone.   4. When you have finished the Miralax, drink at least 4 glasses of clear liquids of your choice. You will start experiencing diarrhea anywhere between 30 minutes to 3 hours after completing the Miralax. Keep drinking plenty of clear liquids throughout the day. This will keep you from getting dehydrated from the diarrhea.   5. Take your antibiotics  by mouth at these times after you complete the Miralax:   - At 2 p.m.: Take Flagyl (1 gram, total of two tablets)   - At 3 p.m.: Take Flagyl 2 tablets (1000 mg total or 1 gm)   - At 10 p.m.: Take Flagyl 2 tablets (1000 mg total or 1 gm)   The Day of Your Surgery   On the morning of your surgery, only take the medicines that the pre-care suite told you were okay to take. Take them only with a sip of water.   Special Instructions:   - Please be sure you make your surgeon aware if you have diabetes. Your diabetic medications may need to be adjusted.   -  If you feel dizzy or have severe nausea, vomiting or abdominal pain, or if you cannot finish drinking the bowel prep, please call the Gynecologic Oncology clinic at [phone] or your surgeon.   - If you have any life-threatening symptoms including wheezing, chest tightness, fever, swelling of your face, lips, tongue or throat, call [phone]- right away.   Questions?   Please call if you have questions or concerns:   - Weekdays 8:00 a.m. to 5:00 pm: Call the office at [phone]   - After hours and on weekends and holidays, call the paging operator at [phone] and ask for the GYN ONC on call to be paged.

## 2024-02-12 NOTE — H&P (View-Only) (Signed)
 Gynecologic Oncology Return Clinic Visit  Date of Service: 02/12/2024 Referring Provider: Artis Delay, MD [address] [place],  [place] [id number]  Assessment & Plan: Lori Conrad is a 78 y.o. 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 Eugene Garnet, now s/p 4 cycles of NACT (last given on 01/26/24). She presents today for discussion of interval debulking surgery.   Ovarian cancer: - S/p 4C NACT. - Interval imaging on 01/24/24 overall with great response to treatment. CT chest on 02/08/24 to follow-up pleural lesion. - Has had great response with CA125 down to 60.6 at last chemo. - Recommend proceeding with interval debulking surgery at this time. 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 NGS HRD neg. - Reviewed possible need for bowel surgery, possible diverting ostomy pending intraoperative findings.   Patient was consented for: Diagnostic laparoscopy, robotic versus open total hysterectomy, bilateral salpingo-oophorectomy, omentectomy, tumor debulking, possible bowel surgery, possible ostomy on 02/20/24.  The risks of surgery were discussed in detail and she understands these to including but not limited to bleeding requiring a blood transfusion, infection, injury to adjacent organs (including but not limited to the bowels, bladder, ureters, nerves, blood vessels), thromboembolic events, wound separation, hernia, vaginal cuff separation, possible risk of lymphedema and lymphocyst if lymphadenectomy performed, unforseen complication, possible need for re-exploration, medical complications such as heart attack, stroke, pneumonia, and possible permanent ostomy.  If the patient experiences any of these events, she understands that her hospitalization or recovery may be prolonged and that she may need to take additional  medications for a prolonged period. The patient will receive DVT and antibiotic prophylaxis as indicated. She voiced a clear understanding. She had the opportunity to ask questions and informed consent was obtained today. She wishes to proceed.  She was given prescriptions and instructions for preoperative bowel prep. Will plan for preoperative ostomy marking in case. Will likely plan for at least 2 weeks postoperative chemoprophylaxis, possibly 4 weeks pending robotic versus laparotomy. She does not require preoperative clearance. Her METs are >4.  All preoperative instructions were reviewed. Postoperative expectations were also reviewed. Written handouts were provided to the patient.   RTC postop  Clide Cliff, MD Gynecologic Oncology   Medical Decision Making I personally spent  TOTAL 45 minutes face-to-face and non-face-to-face in the care of this patient, which includes all pre, intra, and post visit time on the date of service.    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 EXAM: IMPLANTED PORT A CATH PLACEMENT WITH ULTRASOUND AND FLUOROSCOPIC GUIDANCE COMPARISON:  None Available. MEDICATIONS: None. ANESTHESIA/SEDATION: Moderate (conscious) sedation was employed during this procedure. A total of Versed 1 mg and Fentanyl 50 mcg was administered intravenously. Moderate Sedation Time: 15 minutes. The patient's level of consciousness and vital signs were monitored continuously by radiology nursing throughout the procedure under my direct supervision. CONTRAST:  None FLUOROSCOPY TIME:  One mGy COMPLICATIONS: None immediate.  PROCEDURE: The procedure, risks, benefits, and alternatives were explained to the patient. Questions regarding the procedure were encouraged and answered. The patient understands and consents to the procedure. The right neck  and chest were prepped with chlorhexidine in a sterile fashion, and a sterile drape was applied covering the operative field. Maximum barrier sterile technique with sterile gowns and gloves were used for the procedure. A timeout was performed prior to the initiation of the procedure. Ultrasound was used to examine the jugular vein which was compressible and free of internal echoes. A skin marker was used to demarcate the planned venotomy and port pocket incision sites. Local anesthesia was provided to these sites and the subcutaneous tunnel track with 1% lidocaine with 1:100,000 epinephrine. A small incision was created at the jugular access site and blunt dissection was performed of the subcutaneous tissues. Under ultrasound guidance, the jugular vein was accessed with a 21 ga micropuncture needle and an 0.018" wire was inserted to the superior vena cava. Real-time ultrasound guidance was utilized for vascular access including the acquisition of a permanent ultrasound image documenting patency of the accessed vessel. A 5 Fr micopuncture set was then used, through which a 0.035" Rosen wire was passed under fluoroscopic guidance into the inferior vena cava. An 8 Fr dilator was then placed over the wire. A subcutaneous port pocket was then created along the upper chest wall utilizing a combination of sharp and blunt dissection. The pocket was irrigated with sterile saline, packed with gauze, and observed for hemorrhage. A single lumen plastic power injectable port was chosen for placement. The 8 Fr catheter was tunneled from the port pocket site to the venotomy incision. The port was placed in the pocket. The external catheter was trimmed to appropriate length. The dilator was exchanged for an 8  Fr peel-away sheath under fluoroscopic guidance. The catheter was then placed through the sheath and the sheath was removed. Final catheter positioning was confirmed and documented with a fluoroscopic spot radiograph. The port was accessed with a Huber needle, aspirated, and flushed with heparinized saline. The deep dermal layer of the port pocket incision was closed with interrupted 3-0 Vicryl suture. Dermabond was then placed over the port pocket and neck incisions. The patient tolerated the procedure well without immediate post procedural complication. FINDINGS: After catheter placement, the tip lies within the superior cavoatrial junction. The catheter aspirates and flushes normally and is ready for immediate use. IMPRESSION: Successful placement of a power injectable Port-A-Cath via the right internal jugular vein. The catheter is ready for immediate use. Marliss Coots, MD [facility] Electronically Signed   By: Marliss Coots M.D.   On: 11/01/2023 13:22   MR ABDOMEN W WO CONTRAST  Result Date: 10/31/2023 CLINICAL DATA:  Pelvic mass.  Liver lesion EXAM: MRI ABDOMEN WITHOUT AND WITH CONTRAST TECHNIQUE: Multiplanar multisequence MR imaging of the abdomen was performed both before and after the administration of intravenous contrast. CONTRAST:  5mL GADAVIST GADOBUTROL 1 MMOL/ML IV SOLN COMPARISON:  CT 10/30/2023 and older. FINDINGS: Lower chest: Trace pleural fluid. Hepatobiliary: Numerous bright T2, low T1 nonenhancing foci identified consistent with benign cystic lesions. Many of these are under 15 mm. There are some larger foci identified such as segment 4 measuring 4.3 cm and caudate measuring 2.8 cm. Prior CT did demonstrate 1 lesion which is more complex anteriorly in the left hepatic lobe, segment 3 which on today's examination when taking into account motion show some progressive enhancement, is bright on T2 but not as bright as simple fluid and  consistent with a small hemangioma. No specific aggressive liver lesion clearly identified today. Patent portal vein. Gallbladder is nondilated. No biliary ductal  dilatation. Pancreas: Ectatic pancreatic duct diffusely measuring up to 6 mm, severe. No pancreatic focal atrophy, abnormal enhancement or abnormal T1 signal. No restricted diffusion along the pancreas. Spleen:  Within normal limits in size and appearance. Adrenals/Urinary Tract: Adrenal glands are preserved. No enhancing renal mass or collecting system dilatation. Stomach/Bowel: Visualized bowel is nondilated. This includes visualized portions of the small and large bowel. The stomach is underdistended. Vascular/Lymphatic: Normal caliber aorta and IVC. Atherosclerotic changes along the aorta. Circumaortic left renal vein. Once again there is a abnormal lymph node identified anterior to the aorta in the upper abdomen on series 1602, image 58 measuring 2.3 x 1.8 cm. Few other small retroperitoneal nodes identified. Other:  Trace ascites.  Mesenteric stranding. Musculoskeletal: Curvature and degenerative changes along the spine. IMPRESSION: Multiple benign-appearing liver lesions including cysts and 1 hemangioma. Persistent enlarged upper abdominal retroperitoneal lymph node. Additional smaller but prominent nodes as well. With the pelvic findings these are worrisome for potential spread of neoplasm. Mild ascites. Electronically Signed   By: Karen Kays M.D.   On: 10/31/2023 14:10   MR PELVIS W WO CONTRAST  Result Date: 10/31/2023 CLINICAL DATA:  Pelvic mass EXAM: MRI PELVIS WITHOUT AND WITH CONTRAST TECHNIQUE: Multiplanar multisequence MR imaging of the pelvis was performed both before and after administration of intravenous contrast. CONTRAST:  5mL GADAVIST GADOBUTROL 1 MMOL/ML IV SOLN COMPARISON:  CT 10/30/2019 FINDINGS: Urinary Tract: Bladder is mildly distended with fluid. There is some mass effect along the posterior aspect of the bladder related  to the adjacent complex mass. The bladder itself appears grossly intact. No abnormal wall enhancement. Grossly preserved course of the urethra. Bowel: The visualized bowel in the pelvis is nondilated. However there is lobular masslike area along the distal sigmoid colon with the areas of heterogeneous enhancement. The dominant lesion in this location on series 4, image 29 measures 3.7 by 2.6 cm. There are several adjacent soft tissue nodules as well such as just posterior to the cervix and anterior to the bowel measuring 10 mm on series 22 image 30. Focus superior left lateral series 22, image 26 measures 2.3 x 1.7 cm. Additional foci elsewhere dependently in the pelvis including the presacral space. Vascular/Lymphatic: Atherosclerotic changes identified along the iliac vessels. No separate nodal enlargement identified. Reproductive: Uterus measures 8.2 by 2.0 by 3.4 cm. Endometrial stripe is less than 3 mm. Slightly heterogeneous myometrium. Anterior to the uterus is a large complex cystic and solid mass with heterogeneous enhancement of the solid component. Lesion measures 11.5 by 7.6 by 7.4 cm. The more solid component is right lateral inferior with a aggressive in enhancement measuring proximally 7.7 by 5.6 cm. Cephalocaudal length 8.5 cm. The cystic component more towards the left has a dimensions approaching 8.7 cm. There is surrounding free fluid and edema. Although this more in the central pelvis towards midline a right ovary is not seen as a separate structure. There is what appears to be a small postmenopausal of the left ovary measuring 15 mm. An ovarian neoplasm is a strong consideration. Other:  Small amount of free fluid in the pelvis.  Edema. Musculoskeletal: Curvature of the spine. Moderate degenerative changes of the lumbar spine with disc bulging and areas of stenosis greatest at L4-5. Degenerative changes of the pelvis and hips as well. Study is somewhat limited due to some artifacts postcontrast  axial dataset as well as study being performed as a standard pelvis rather than a gynecologic pelvis exam. Please see separate dictation of abdomen MRI.  IMPRESSION: Large complex cystic and solid pelvic mass centrally measuring up to 11.5 x 7.6 x 7.4 cm. Based on overall appearance this has worrisome for a neoplasm including an ovarian malignancy. Small amount of ascites in the pelvis. Soft tissue enhancing aggressive nodules along the course of the sigmoid colon as well as in the adjacent fat and presacral spaces. With the larger central mass this very well could be spread of disease to adjacent structures rather than a primary colonic process but correlate with symptoms and if needed colonoscopy. Please see separate dictation of abdominal MRI. Electronically Signed   By: Karen Kays M.D.   On: 10/31/2023 14:01   CT CHEST W CONTRAST  Result Date: 10/31/2023 CLINICAL DATA:  78 year old female with suspected gynecologic malignancy. * Tracking Code: BO * EXAM: CT CHEST WITH CONTRAST TECHNIQUE: Multidetector CT imaging of the chest was performed during intravenous contrast administration. RADIATION DOSE REDUCTION: This exam was performed according to the departmental dose-optimization program which includes automated exposure control, adjustment of the mA and/or kV according to patient size and/or use of iterative reconstruction technique. CONTRAST:  75mL OMNIPAQUE IOHEXOL 350 MG/ML SOLN COMPARISON:  None Available. FINDINGS: Cardiovascular: The heart size is mildly enlarged. No pericardial effusion. Aortic atherosclerosis and coronary artery calcification. Mediastinum/Nodes: Trachea and esophagus appear unremarkable. The right lobe of thyroid gland appears surgically absent. No mediastinal or hilar adenopathy. Lungs/Pleura: No pleural effusion identified. Subsegmental atelectasis identified within the lingula and bilateral posterior lung bases. No signs of interstitial edema or airspace consolidation. No  suspicious pulmonary nodule identified to suggest lung metastases. Upper Abdomen: No acute abnormality. Multiple liver cysts. Enlarged lymph node within the portal caval region measures 1.5 cm, image 155/3. Defer to report from CT AP dated 10/30/2019 for and same-day MRI of the abdomen pelvis for further details. Musculoskeletal: Mild curvature of the thoracic spine and lumbar spine is convex towards the left. Multilevel degenerative disc disease. No acute or suspicious osseous lesions. IMPRESSION: 1. No signs of metastatic disease to the chest. 2. Areas of subsegmental atelectasis noted within bilateral posterior lung bases and lingula. 3. Enlarged upper abdominal lymph node as above. In the setting of a known malignancy this is concerning for nodal metastasis. 4. Coronary artery calcifications. 5.  Aortic Atherosclerosis (ICD10-I70.0). Electronically Signed   By: Signa Kell M.D.   On: 10/31/2023 05:57   CT ABDOMEN PELVIS W CONTRAST  Result Date: 10/30/2023 CLINICAL DATA:  Abdominal pain. EXAM: CT ABDOMEN AND PELVIS WITH CONTRAST TECHNIQUE: Multidetector CT imaging of the abdomen and pelvis was performed using the standard protocol following bolus administration of intravenous contrast. RADIATION DOSE REDUCTION: This exam was performed according to the departmental dose-optimization program which includes automated exposure control, adjustment of the mA and/or kV according to patient size and/or use of iterative reconstruction technique. CONTRAST:  75mL OMNIPAQUE IOHEXOL 350 MG/ML SOLN COMPARISON:  Limb 1424 FINDINGS: Lower chest: No acute findings. Hepatobiliary: Multiple hepatic cysts evident. Scattered tiny hypodensities in the liver parenchyma are too small to characterize but are statistically most likely benign. No followup imaging is recommended. Tiny subcapsular lesion measured previously at 9 mm in the anterior left liver is stable on image 23/3 today, nonspecific. There is no evidence for  gallstones, gallbladder wall thickening, or pericholecystic fluid. No intrahepatic or extrahepatic biliary dilation. Pancreas: Dilatation of the pancreatic duct to the head and body of pancreas is similar to prior. Spleen: No splenomegaly. No suspicious focal mass lesion. Adrenals/Urinary Tract: No adrenal nodule or mass. Kidneys unremarkable.  No evidence for hydroureter. Bladder is distended. Stomach/Bowel: Stomach is unremarkable. No gastric wall thickening. No evidence of outlet obstruction. Duodenum is normally positioned as is the ligament of Treitz. No small bowel wall thickening. No small bowel dilatation. Diverticular changes are noted in the left colon without evidence of diverticulitis. Vascular/Lymphatic: 16 mm short axis portal caval lymph node seen on 22/3. No para-aortic lymphadenopathy. No pelvic sidewall lymphadenopathy. Reproductive: Multiple uterine fibroids evident. As noted on prior study there is an area of the anterior cervix that appears to obliterate the fat plane between the cervix and the posterior wall of the bladder (see sagittal 86/7). Small soft tissue nodules are again noted in the cul-de-sac some of which may pertain to diverticuli, but others raise concern for peritoneal nodularity (see images 61 and 56 of series 3). Other: No substantial free fluid. Musculoskeletal: No worrisome lytic or sclerotic osseous abnormality. IMPRESSION: 1. Multiple uterine fibroids. As noted on prior study there is an area of the anterior cervix that appears to obliterate the fat plane between the cervix and the posterior wall of the bladder. This is concerning for a cervical mass. Gynecologic consultation recommended. 2. Small soft tissue nodules in the cul-de-sac some of which may relate to diverticuli, but others raise concern for peritoneal nodularity. Attention on follow-up recommended. PET-CT may prove helpful to further evaluate 3. 16 mm short axis portal caval lymph node, metastatic disease not  excluded. 4. Left colonic diverticulosis without diverticulitis. Electronically Signed   By: Kennith Center M.D.   On: 10/30/2023 13:08   CT ABDOMEN PELVIS W CONTRAST  Result Date: 10/26/2023 CLINICAL DATA:  Pt w/ abnormal [place]; mass in pelvis; no h/o cancer; no pain; no urinary issues EXAM: CT ABDOMEN AND PELVIS WITH CONTRAST TECHNIQUE: Multidetector CT imaging of the abdomen and pelvis was performed using the standard protocol following bolus administration of intravenous contrast. RADIATION DOSE REDUCTION: This exam was performed according to the departmental dose-optimization program which includes automated exposure control, adjustment of the mA and/or kV according to patient size and/or use of iterative reconstruction technique. CONTRAST:  ISOVUE-300 IOPAMIDOL (ISOVUE-300) INJECTION 61% COMPARISON:  Ultrasound pelvis 09/01/2023, ultrasound thyroid 10/10/2023 FINDINGS: Lower chest: Mitral annular calcification. Aortic valve leaflet calcification. No acute abnormality. Hepatobiliary: Multiple fluid density lesions scattered throughout the left right hepatic lobe. There an indeterminate 0.9 cm left hepatic lobe hypodensity with Hounsfield unit of 77 (2:29). No gallstones, gallbladder wall thickening, or pericholecystic fluid. No biliary dilatation. Pancreas: No focal lesion. Normal pancreatic contour. No surrounding inflammatory changes. No main pancreatic ductal dilatation. Spleen: Normal in size without focal abnormality. Adrenals/Urinary Tract: No adrenal nodule bilaterally. Bilateral kidneys enhance symmetrically. No hydronephrosis. No hydroureter. The urinary bladder is unremarkable. On delayed imaging, there is no urothelial wall thickening and there are no filling defects in the opacified portions of the bilateral collecting systems or ureters. Stomach/Bowel: Stomach is within normal limits. No evidence of small bowel wall thickening or dilatation. Increased stool burden proximal to the distal  sigmoid colon mass with stool throughout the ascending, transverse, descending colon. Short segment of distal sigmoid colon irregular bowel wall thickening (5:26, 2:62). No large bowel luminal dilatation. Colonic diverticulosis appendix appears normal. Vascular/Lymphatic: No abdominal aorta or iliac aneurysm. 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 No abdominal, pelvic, or inguinal lymphadenopathy. Reproductive: There is a heterogeneous solid and cystic 11 x 9 cm mass arising from the uterine fundus. Finding is noted to  invade into the urinary bladder dome (6:81, 5:56 close) where there is loss of intraperitoneal and lower ring of the urinary bladder wall margin. The mass is noted to abut and appears to be inseparable from a short segment of distal sigmoid colon in the region of irregular bowel wall thickening. Other: No intraperitoneal free fluid. No intraperitoneal free gas. No organized fluid collection. Musculoskeletal: No abdominal wall hernia or abnormality. No suspicious lytic or blastic osseous lesions. No acute displaced fracture. Multilevel severe degenerative changes of the spine. Grade 1 anterolisthesis of L4 on L5 and L5 on S1. Mild retrolisthesis of L2 on L3 and L3 on L4. Dextroscoliosis centered at the L3-L4 level. IMPRESSION: 1. An 11 x 9 cm heterogeneous solid and cystic mass arises from the uterine fundus and is noted to invade the urinary bladder dome wall as well as the distal sigmoid colon. No associated bowel obstruction; however, constipation proximal to irregular bowel wall thickening/mass. No associated stercoral colitis. Finding consistent with malignancy. Recommend gynecologic consultation. When the patient is clinically stable and able to follow directions and hold their breath (preferably as an outpatient) further evaluation with dedicated MRI with and without contrast should be considered. 2. Indeterminate  0.9 cm left hepatic lobe hypodense lesion with a density of 77 HU. Question metastasis versus primary hepatic lesion. 3. Stool throughout the colon 4. Colonic diverticulosis with no acute diverticulitis. 5. 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 6.  Aortic Atherosclerosis (ICD10-I70.0)-severe. These results will be called to the ordering clinician or representative by the Radiologist Assistant, and communication documented in the PACS or [employer]. Electronically Signed   By: Tish Frederickson M.D.   On: 10/26/2023 18:42   DG BONE DENSITY (DXA)  Result Date: 10/26/2023 EXAM: DUAL X-RAY ABSORPTIOMETRY (DXA) FOR BONE MINERAL DENSITY IMPRESSION: Referring Physician:  Henrine Screws Your patient completed a bone mineral density test using GE Lunar iDXA system (analysis version: 16). Technologist: BEC PATIENT: Name: Timiyah, Romito Patient ID: [id number] Birth Date: Aug 05, 1946 Height: 60.5 in. Sex: Female Measured: 10/26/2023 Weight: 110.2 lbs. Indications: Advanced Age, Caucasian, Estrogen Deficient, Height Loss (781.91), History of Osteoporosis, Levothyroxine, Postmenopausal Fractures: Left Ankle Treatments: Calcium (E943.0), Vitamin D (E933.5) ASSESSMENT: The BMD measured at Forearm Radius 33% is 0.624 g/cm2 with a T-score of -2.9. This patient's diagnostic category is OSTEOPOROSIS according to World Health Organization [facility]) criteria. Comparison to 10/16/2019. Since the prior study, there has been a SIGNIFICANT DECREASE in bone mineral density of the hips (-4.6%). The lumbar spine was excluded due to being excluded from prior exam. The quality of the exam is good. Site Region Measured Date Measured Age YA BMD Significant CHANGE T-score Right Forearm Radius 33% 10/26/2023 78.0 -2.9 0.624 g/cm2 Right Forearm Radius 33% 10/16/2019 74.0 -2.6 0.653 g/cm2 DualFemur Neck Left 10/26/2023 78.0 -0.6 0.952 g/cm2 DualFemur Neck Left 10/16/2019 74.0 -0.5 0.962 g/cm2 DualFemur Total Mean 10/26/2023 78.0 -0.7  0.918 g/cm2 * DualFemur Total Mean 10/16/2019 74.0 -0.4 0.962 g/cm2 World Health Organization [facility]) criteria for post-menopausal, Caucasian Women: Normal       T-score at or above -1 SD Osteopenia   T-score between -1 and -2.5 SD Osteoporosis T-score at or below -2.5 SD RECOMMENDATION: 1. All patients should optimize calcium and vitamin D intake. 2. Consider FDA-approved medical therapies in postmenopausal women and men aged 11 years and older, based on the following: a. A hip or vertebral (clinical or morphometric) fracture. b. T-score = -2.5 at the femoral neck or spine after appropriate evaluation to exclude  secondary causes. c. Low bone mass (T-score between -1.0 and -2.5 at the femoral neck or spine) and a 10-year probability of a hip fracture = 3% or a 10-year probability of a major osteoporosis-related fracture = 20% based on the [place]-adapted WHO algorithm. d. Clinician judgment and/or patient preferences may indicate treatment for people with 10-year fracture probabilities above or below these levels. FOLLOW-UP: Patients with diagnosis of osteoporosis or at high risk for fracture should have regular bone mineral density tests.? Patients eligible for Medicare are allowed routine testing every 2 years.? The testing frequency can be increased to one year for patients who have rapidly progressing disease, are receiving or discontinuing medical therapy to restore bone mass, or have additional risk factors. I have reviewed this study and agree with the findings. [facility] Electronically Signed   By: Harmon Pier M.D.   On: 10/26/2023 12:30      11/08/2023 Pathology Results   SURGICAL PATHOLOGY CASE: [id number] PATIENT: [facility] Surgical Pathology Report  Clinical History: Pelvic mass, suspected malignancy (crm)   FINAL MICROSCOPIC DIAGNOSIS:  A. PELVIC SIDEWALL NODULE, RIGHT, EXCISION: - High-grade serous carcinoma, see comment  B. ABDOMINAL WALL #1, ANTERIOR, EXCISION: -  High-grade serous carcinoma  COMMENT:  A.  Immunohistochemical stain show that the tumor cells are positive for CK7, PAX8, and p16 (diffuse overexpression).  Immunostain for p53 shows a clonal null expression pattern.  Immunostains for CK20 is negative. This immunoprofile is consistent with the above interpretation and suggestive of an ovarian primary.    11/14/2023 Initial Diagnosis   Malignant ovarian neoplasm, right (HCC)   11/14/2023 Cancer Staging   Staging form: Ovary, Fallopian Tube, and Primary Peritoneal Carcinoma, AJCC 8th Edition - Clinical stage from 11/14/2023: FIGO Stage IIIC (cT3c, cN1, cM0) - Signed by Artis Delay, MD on 11/14/2023 Stage prefix: Initial diagnosis   11/20/2023 Tumor Marker   Patient's tumor was tested for the following markers: CA-125. Results of the tumor marker test revealed 169.   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  No other deleterious variants detected in [facility] CancerNext-Expanded +RNAinsight Panel.  Report date is 12/26/2023.   The CancerNext-Expanded gene panel offered by [facility] and includes sequencing, rearrangement, and RNA analysis for the following 76 genes: AIP, ALK, APC, ATM, AXIN2, BAP1, BARD1, BMPR1A, BRCA1, BRCA2, BRIP1, CDC73, CDH1, CDK4, CDKN1B, CDKN2A, CEBPA, CHEK2, CTNNA1, DDX41, DICER1, ETV6, FH, FLCN, GATA2, LZTR1, MAX, MBD4, MEN1, MET, MLH1, MSH2, MSH3, MSH6, MUTYH, NF1, NF2, NTHL1, PALB2, PHOX2B, PMS2, POT1, PRKAR1A, PTCH1, PTEN, RAD51C, RAD51D, RB1, RET, RUNX1, SDHA, SDHAF2, SDHB, SDHC, SDHD, SMAD4, SMARCA4, SMARCB1, SMARCE1, STK11, SUFU, TMEM127, TP53, TSC1, TSC2, VHL, and WT1 (sequencing and deletion/duplication); EGFR, HOXB13, KIT, MITF, PDGFRA, POLD1, and POLE (sequencing only); EPCAM and GREM1 (deletion/duplication only).      01/05/2024 Tumor Marker   Patient's tumor was tested for the following markers: CA-125. Results of the tumor marker test revealed 130.   01/18/2024 Imaging   CT ABDOMEN PELVIS W CONTRAST Result Date: 01/24/2024 CLINICAL DATA:  78 year old female with history of ovarian cancer status post chemotherapy. Follow-up study. * Tracking Code: BO * EXAM: CT ABDOMEN AND PELVIS WITH CONTRAST TECHNIQUE: Multidetector CT imaging of the abdomen and pelvis was performed using the standard protocol following bolus administration of intravenous contrast. RADIATION  DOSE REDUCTION: This exam was performed according to the departmental dose-optimization program which includes automated exposure control, adjustment of the mA and/or kV according to patient size and/or use of iterative reconstruction technique. CONTRAST:  80mL OMNIPAQUE IOHEXOL 300 MG/ML  SOLN COMPARISON:  CT of the abdomen and pelvis 12/07/2023. FINDINGS: Lower chest: 2.8 x 0.7 cm new area of pleural-based nodularity in the right lower lobe (axial image 5 of series 2) concerning for probable metastatic lesion. Hepatobiliary: Multiple well-defined low-attenuation lesions scattered throughout the liver, largest of which are all compatible with simple cysts, measuring up to 4.4 x 2.7 cm in segment 4A. Multiple other subcentimeter low-attenuation lesions, too small to definitively characterize, but similar to the prior study and statistically likely to represent tiny cysts and/or biliary hamartomas. Gallbladder is unremarkable in appearance. Pancreas: No pancreatic mass. No pancreatic ductal dilatation. No pancreatic or peripancreatic fluid collections or inflammatory changes. Spleen: Unremarkable. Adrenals/Urinary Tract: Bilateral kidneys and bilateral adrenal glands are normal in appearance. No hydroureteronephrosis. Urinary bladder is unremarkable in appearance. Stomach/Bowel: Appearance of the stomach is normal. There is no pathologic dilatation of small bowel or  colon. Numerous colonic diverticula are noted, without definite focal surrounding inflammatory changes to indicate an acute diverticulitis at this time. Previously noted mass in the sigmoid colon is not readily apparent on today's examination. The appendix is not confidently identified and may be surgically absent. Regardless, there are no inflammatory changes noted adjacent to the cecum to suggest the presence of an acute appendicitis at this time. Vascular/Lymphatic: Atherosclerotic calcifications are noted throughout the abdominal aorta and pelvic vasculature. Retroaortic left renal vein (normal anatomical variant) incidentally noted. No definite lymphadenopathy confidently identified in the abdomen or pelvis. Reproductive: Status post hysterectomy. Left ovary is unremarkable in appearance. Right adnexal mass has regressed when compared to the prior examination, currently measuring 7.8 x 7.4 x 7.7 cm (axial image 51 of series 2 and coronal image 51 of series 5). Other: Previously noted peritoneal implants in the low pelvis appear to have regressed compared to the prior examination. No new peritoneal implants confidently identified. No significant volume of ascites. No pneumoperitoneum. Musculoskeletal: There are no aggressive appearing lytic or blastic lesions noted in the visualized portions of the skeleton. IMPRESSION: 1. Today's study demonstrates a positive response to therapy with regression of previously noted right adnexal mass and peritoneal implants in the low pelvis. 2. However, there is a new pleural-based nodule in the right lower lobe concerning for a metastatic lesion. 3. Colonic diverticulosis without evidence of acute diverticulitis at this time. 4. Aortic atherosclerosis. Aortic Atherosclerosis (ICD10-I70.0). Electronically Signed   By: Trudie Reed M.D.   On: 01/24/2024 10:40      01/26/2024 Tumor Marker   Patient's tumor was tested for the following markers: CA-125. Results of the  tumor marker test revealed 60.6.   02/03/2024 Imaging   CT Chest W Contrast Result Date: 02/08/2024 CLINICAL DATA:  Pulmonary nodule. Ovarian cancer. * Tracking Code: BO * EXAM: CT CHEST WITH CONTRAST TECHNIQUE: Multidetector CT imaging of the chest was performed during intravenous contrast administration. RADIATION DOSE REDUCTION: This exam was performed according to the departmental dose-optimization program which includes automated exposure control, adjustment of the mA and/or kV according to patient size and/or use of iterative reconstruction technique. CONTRAST:  75mL OMNIPAQUE IOHEXOL 300 MG/ML  SOLN COMPARISON:  CT abdomen pelvis 01/18/2024 and CT chest 10/30/2023. FINDINGS: Cardiovascular: Right IJ Port-A-Cath terminates in the low SVC. Atherosclerotic calcification of the aorta, aortic valve and coronary  arteries. Heart is at the upper limits of normal in size to mildly enlarged. No pericardial effusion. Enlarged pulmonic trunk. Mediastinum/Nodes: No pathologically enlarged mediastinal, hilar or axillary lymph nodes. Esophagus is grossly unremarkable. Lungs/Pleura: Subpleural scarring in the medial right lower lobe with somewhat of a plaque like area superiorly, measuring 0.7 x 2.7 cm (8/109). Finding is unchanged from 01/18/2024 and new from 10/30/2023. Otherwise, no suspicious pulmonary nodules. No pleural fluid. Airway is unremarkable. Upper Abdomen: Multiple hepatic cysts. Visualized portions of the liver, adrenal glands, kidneys, spleen, pancreas, stomach and bowel are otherwise grossly unremarkable. No upper abdominal adenopathy. Musculoskeletal: Degenerative changes in the spine. No worrisome lytic or sclerotic lesions. IMPRESSION: 1. Subpleural scarring in the medial right lower lobe with somewhat of a plaque-like area superiorly, as on 01/18/2024, new from 10/30/2023. Scarring or loculated pleural fluid is favored. Metastatic disease cannot be excluded. Recommend attention on follow-up. 2.  Otherwise, no evidence of metastatic disease in the chest. 3. Aortic atherosclerosis (ICD10-I70.0). Coronary artery calcification. 4. Enlarged pulmonic trunk, indicative of pulmonary arterial hypertension. Electronically Signed   By: Leanna Battles M.D.   On: 02/08/2024 14:09   CT ABDOMEN PELVIS W CONTRAST Result Date: 01/24/2024 CLINICAL DATA:  78 year old female with history of ovarian cancer status post chemotherapy. Follow-up study. * Tracking Code: BO * EXAM: CT ABDOMEN AND PELVIS WITH CONTRAST TECHNIQUE: Multidetector CT imaging of the abdomen and pelvis was performed using the standard protocol following bolus administration of intravenous contrast. RADIATION DOSE REDUCTION: This exam was performed according to the departmental dose-optimization program which includes automated exposure control, adjustment of the mA and/or kV according to patient size and/or use of iterative reconstruction technique. CONTRAST:  80mL OMNIPAQUE IOHEXOL 300 MG/ML  SOLN COMPARISON:  CT of the abdomen and pelvis 12/07/2023. FINDINGS: Lower chest: 2.8 x 0.7 cm new area of pleural-based nodularity in the right lower lobe (axial image 5 of series 2) concerning for probable metastatic lesion. Hepatobiliary: Multiple well-defined low-attenuation lesions scattered throughout the liver, largest of which are all compatible with simple cysts, measuring up to 4.4 x 2.7 cm in segment 4A. Multiple other subcentimeter low-attenuation lesions, too small to definitively characterize, but similar to the prior study and statistically likely to represent tiny cysts and/or biliary hamartomas. Gallbladder is unremarkable in appearance. Pancreas: No pancreatic mass. No pancreatic ductal dilatation. No pancreatic or peripancreatic fluid collections or inflammatory changes. Spleen: Unremarkable. Adrenals/Urinary Tract: Bilateral kidneys and bilateral adrenal glands are normal in appearance. No hydroureteronephrosis. Urinary bladder is unremarkable  in appearance. Stomach/Bowel: Appearance of the stomach is normal. There is no pathologic dilatation of small bowel or colon. Numerous colonic diverticula are noted, without definite focal surrounding inflammatory changes to indicate an acute diverticulitis at this time. Previously noted mass in the sigmoid colon is not readily apparent on today's examination. The appendix is not confidently identified and may be surgically absent. Regardless, there are no inflammatory changes noted adjacent to the cecum to suggest the presence of an acute appendicitis at this time. Vascular/Lymphatic: Atherosclerotic calcifications are noted throughout the abdominal aorta and pelvic vasculature. Retroaortic left renal vein (normal anatomical variant) incidentally noted. No definite lymphadenopathy confidently identified in the abdomen or pelvis. Reproductive: Status post hysterectomy. Left ovary is unremarkable in appearance. Right adnexal mass has regressed when compared to the prior examination, currently measuring 7.8 x 7.4 x 7.7 cm (axial image 51 of series 2 and coronal image 51 of series 5). Other: Previously noted peritoneal implants in the low pelvis appear to have  regressed compared to the prior examination. No new peritoneal implants confidently identified. No significant volume of ascites. No pneumoperitoneum. Musculoskeletal: There are no aggressive appearing lytic or blastic lesions noted in the visualized portions of the skeleton. IMPRESSION: 1. Today's study demonstrates a positive response to therapy with regression of previously noted right adnexal mass and peritoneal implants in the low pelvis. 2. However, there is a new pleural-based nodule in the right lower lobe concerning for a metastatic lesion. 3. Colonic diverticulosis without evidence of acute diverticulitis at this time. 4. Aortic atherosclerosis. Aortic Atherosclerosis (ICD10-I70.0). Electronically Signed   By: Trudie Reed M.D.   On: 01/24/2024  10:40        Interval History: Patient presents today with her son.  Reports that she has done well with chemotherapy.  Notes that she had fatigue with the first treatment and brain fog but has done better since with more recent treatments.  She lives in assisted living facility.     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      Dilated aortic root (HCC)    Hyperlipidemia    Hypothyroidism    Macular degeneration     Past Surgical History:  Procedure Laterality Date   ANKLE FRACTURE SURGERY Left    BROW LIFT Bilateral 05/17/2022   Procedure: BILATERAL UPPER BLEPHAROPLASTY WITH PTOSIS REPAIR;  Surgeon: Janne Napoleon, MD;  Location: [facility] OR;  Service: Plastics;  Laterality: Bilateral;  1.5 hours   BUNIONECTOMY Left    COLONOSCOPY     EYE SURGERY     FLEXIBLE SIGMOIDOSCOPY N/A 11/02/2023   Procedure: FLEXIBLE SIGMOIDOSCOPY;  Surgeon: Kerin Salen, MD;  Location: [facility] END[facility]COPY;  Service: Gastroenterology;  Laterality: N/A;   IR IMAGING GUIDED PORT INSERTION  11/01/2023   LAPAROSCOPY N/A 11/08/2023   Procedure: LAPAROSCOPY DIAGNOSTIC WITH BIOPSY;  Surgeon: Carver Fila, MD;  Location: [facility] ORS;  Service: Gynecology;  Laterality: N/A;   REFRACTIVE SURGERY     laser for macular degeneration   SHOULDER ARTHROSCOPY     TONSILLECTOMY      Family History  Problem Relation Age of Onset   Lung cancer Mother 33 - 62       smoked   Stroke Mother 7   Alzheimer's disease Father    CAD Father        95% occlusion of L Main & RCA, severe descending aorta, arterial & arteriolonephrosclerosis    Cholecystitis Father    Esophagitis Father    Cancer Brother 22 - 76       unknown type, agent orange exposure   Pancreatic cancer Daughter 17   BRCA 1/2 Neg Hx    Breast cancer Neg Hx    Colon cancer Neg Hx    Ovarian cancer Neg Hx    Endometrial cancer Neg Hx    Prostate  cancer Neg Hx     Social History   Socioeconomic History   Marital status: Widowed    Spouse name: Not on file   Number of children: 2   Years of education: Not on file   Highest education level: Not on file  Occupational History   Not on file  Tobacco Use   Smoking status: Former    Current packs/day: 0.00    Types: Cigarettes    Quit date: 05/15/1988    Years since quitting: 35.7   Smokeless tobacco: Never  Vaping Use  Vaping status: Never Used  Substance and Sexual Activity   Alcohol use: Yes    Alcohol/week: 14.0 standard drinks of alcohol    Types: 14 drink(s) per week    Comment: white wine   Drug use: No   Sexual activity: Not Currently    Partners: Male  Other Topics Concern   Not on file  Social History Narrative   Not on file   Social Drivers of Health   Financial Resource Strain: Not on file  Food Insecurity: No Food Insecurity (10/30/2023)   Hunger Vital Sign    Worried About Running Out of Food in the Last Year: Never true    Ran Out of Food in the Last Year: Never true  Transportation Needs: No Transportation Needs (10/30/2023)   PRAPARE - Administrator, Civil Service (Medical): No    Lack of Transportation (Non-Medical): No  Physical Activity: Not on file  Stress: Not on file  Social Connections: Not on file    Current Medications:  Current Outpatient Medications:    atorvastatin (LIPITOR) 40 MG tablet, Take 40 mg by mouth at bedtime., Disp: , Rfl:    Calcium Carb-Cholecalciferol (SUPER CALCIUM 600 + D 400 PO), Take 1 tablet by mouth daily., Disp: , Rfl:    Cholecalciferol (VITAMIN D) 50 MCG (2000 UT) tablet, Take 2,000 Units by mouth daily., Disp: , Rfl:    Coenzyme Q10 (COQ10) 400 MG CAPS, Take 400 mg by mouth daily., Disp: , Rfl:    dexamethasone (DECADRON) 4 MG tablet, Take 2 tabs at the night before and 2 tab the morning of chemotherapy, every 3 weeks, by mouth x 6 cycles, Disp: 24 tablet, Rfl: 6   doxylamine, Sleep, (UNISOM)  25 MG tablet, Take 25 mg by mouth at bedtime as needed for sleep., Disp: , Rfl:    Glucosamine-Chondroit-Vit C-Mn (GLUCOSAMINE 1500 COMPLEX PO), Take 1 tablet by mouth daily., Disp: , Rfl:    HYDROcodone-acetaminophen (NORCO/VICODIN) 5-325 MG tablet, Take 1-2 tablets by mouth every 4 (four) hours as needed for moderate pain (pain score 4-6) or severe pain (pain score 7-10)., Disp: 30 tablet, Rfl: 0   lactulose (CHRONULAC) 10 GM/15ML solution, Take 10.5 mLs (7 g total) by mouth 3 (three) times daily as needed for mild constipation., Disp: 236 mL, Rfl: 0   levothyroxine (SYNTHROID) 75 MCG tablet, Take 75 mcg by mouth daily before breakfast., Disp: , Rfl:    lidocaine-prilocaine (EMLA) cream, Apply to affected area once daily prn, Disp: 30 g, Rfl: 3   Magnesium 250 MG TABS, Take 250 mg by mouth daily., Disp: , Rfl:    meloxicam (MOBIC) 7.5 MG tablet, Take 7.5 mg by mouth daily., Disp: , Rfl:    Multiple Vitamins-Minerals (PRESERVISION AREDS 2+MULTI VIT PO), Take 1 tablet by mouth 2 (two) times daily., Disp: , Rfl:    Omega-3 Fatty Acids (OMEGA 3 500) 500 MG CAPS, Take 500 mg by mouth daily., Disp: , Rfl:    ondansetron (ZOFRAN) 8 MG tablet, Take 1 tablet (8 mg total) by mouth every 8 (eight) hours as needed for nausea or vomiting. Start on the third day after carboplatin., Disp: 30 tablet, Rfl: 1   polyethylene glycol (MIRALAX / GLYCOLAX) 17 g packet, Take 17 g by mouth 2 (two) times daily as needed for mild constipation., Disp: 14 each, Rfl: 0   prochlorperazine (COMPAZINE) 10 MG tablet, Take 1 tablet (10 mg total) by mouth every 6 (six) hours as needed for nausea or vomiting.,  Disp: 30 tablet, Rfl: 1   senna-docusate (SENOKOT-S) 8.6-50 MG tablet, Take 2 tablets by mouth 2 (two) times daily as needed for mild constipation. For 3 days after chemotherapy and then prn as needed., Disp: 120 tablet, Rfl: 2  Review of Symptoms: Complete 10-system review is negative except as above in Interval  History.  Physical Exam: BP 124/74 (BP Location: Right Arm, Patient Position: Sitting)   Pulse 87   Temp 98.8 F (37.1 C) (Oral)   Resp 18   Wt 109 lb 3.2 oz (49.5 kg)   SpO2 98%   BMI 20.63 kg/m  General: Alert, oriented, no acute distress. HEENT: Normocephalic, atraumatic. Neck symmetric without masses. Sclera anicteric.  Chest: Normal work of breathing. Clear to auscultation bilaterally.   Cardiovascular: Regular rate and rhythm, no murmurs. Abdomen: Soft, nontender.  Normoactive bowel sounds.  No masses appreciated.  Well-healed incisions. Extremities: Grossly normal range of motion.  Warm, well perfused.  No edema bilaterally. Skin: No rashes or lesions noted. Lymphatics: No cervical, supraclavicular, or inguinal adenopathy. GU: Normal appearing external genitalia without erythema, excoriation, or lesions.  Speculum exam reveals normal cervix and vagina.  Bimanual exam reveals right adnexal mass, somewhat mobile.  Exam chaperoned by Kimberly [place], CMA    Laboratory & Radiologic Studies: Lab Results  Component Value Date   CAN125 60.6 (H) 01/25/2024   CAN125 130.0 (H) 01/04/2024   CAN125 169.0 (H) 11/17/2023   CEA1 3.5 10/31/2023     CT Chest W Contrast 02/03/2024  Narrative CLINICAL DATA:  Pulmonary nodule. Ovarian cancer. * Tracking Code: BO *  EXAM: CT CHEST WITH CONTRAST  TECHNIQUE: Multidetector CT imaging of the chest was performed during intravenous contrast administration.  RADIATION DOSE REDUCTION: This exam was performed according to the departmental dose-optimization program which includes automated exposure control, adjustment of the mA and/or kV according to patient size and/or use of iterative reconstruction technique.  CONTRAST:  75mL OMNIPAQUE IOHEXOL 300 MG/ML  SOLN  COMPARISON:  CT abdomen pelvis 01/18/2024 and CT chest 10/30/2023.  FINDINGS: Cardiovascular: Right IJ Port-A-Cath terminates in the low SVC. Atherosclerotic calcification  of the aorta, aortic valve and coronary arteries. Heart is at the upper limits of normal in size to mildly enlarged. No pericardial effusion. Enlarged pulmonic trunk.  Mediastinum/Nodes: No pathologically enlarged mediastinal, hilar or axillary lymph nodes. Esophagus is grossly unremarkable.  Lungs/Pleura: Subpleural scarring in the medial right lower lobe with somewhat of a plaque like area superiorly, measuring 0.7 x 2.7 cm (8/109). Finding is unchanged from 01/18/2024 and new from 10/30/2023. Otherwise, no suspicious pulmonary nodules. No pleural fluid. Airway is unremarkable.  Upper Abdomen: Multiple hepatic cysts. Visualized portions of the liver, adrenal glands, kidneys, spleen, pancreas, stomach and bowel are otherwise grossly unremarkable. No upper abdominal adenopathy.  Musculoskeletal: Degenerative changes in the spine. No worrisome lytic or sclerotic lesions.  IMPRESSION: 1. Subpleural scarring in the medial right lower lobe with somewhat of a plaque-like area superiorly, as on 01/18/2024, new from 10/30/2023. Scarring or loculated pleural fluid is favored. Metastatic disease cannot be excluded. Recommend attention on follow-up. 2. Otherwise, no evidence of metastatic disease in the chest. 3. Aortic atherosclerosis (ICD10-I70.0). Coronary artery calcification. 4. Enlarged pulmonic trunk, indicative of pulmonary arterial hypertension.   Electronically Signed By: Leanna Battles M.D. On: 02/08/2024 14:09   CT ABDOMEN PELVIS W CONTRAST 01/18/2024  Narrative CLINICAL DATA:  78 year old female with history of ovarian cancer status post chemotherapy. Follow-up study. * Tracking Code: BO *  EXAM: CT ABDOMEN  AND PELVIS WITH CONTRAST  TECHNIQUE: Multidetector CT imaging of the abdomen and pelvis was performed using the standard protocol following bolus administration of intravenous contrast.  RADIATION DOSE REDUCTION: This exam was performed according to  the departmental dose-optimization program which includes automated exposure control, adjustment of the mA and/or kV according to patient size and/or use of iterative reconstruction technique.  CONTRAST:  80mL OMNIPAQUE IOHEXOL 300 MG/ML  SOLN  COMPARISON:  CT of the abdomen and pelvis 12/07/2023.  FINDINGS: Lower chest: 2.8 x 0.7 cm new area of pleural-based nodularity in the right lower lobe (axial image 5 of series 2) concerning for probable metastatic lesion.  Hepatobiliary: Multiple well-defined low-attenuation lesions scattered throughout the liver, largest of which are all compatible with simple cysts, measuring up to 4.4 x 2.7 cm in segment 4A. Multiple other subcentimeter low-attenuation lesions, too small to definitively characterize, but similar to the prior study and statistically likely to represent tiny cysts and/or biliary hamartomas. Gallbladder is unremarkable in appearance.  Pancreas: No pancreatic mass. No pancreatic ductal dilatation. No pancreatic or peripancreatic fluid collections or inflammatory changes.  Spleen: Unremarkable.  Adrenals/Urinary Tract: Bilateral kidneys and bilateral adrenal glands are normal in appearance. No hydroureteronephrosis. Urinary bladder is unremarkable in appearance.  Stomach/Bowel: Appearance of the stomach is normal. There is no pathologic dilatation of small bowel or colon. Numerous colonic diverticula are noted, without definite focal surrounding inflammatory changes to indicate an acute diverticulitis at this time. Previously noted mass in the sigmoid colon is not readily apparent on today's examination. The appendix is not confidently identified and may be surgically absent. Regardless, there are no inflammatory changes noted adjacent to the cecum to suggest the presence of an acute appendicitis at this time.  Vascular/Lymphatic: Atherosclerotic calcifications are noted throughout the abdominal aorta and pelvic  vasculature. Retroaortic left renal vein (normal anatomical variant) incidentally noted. No definite lymphadenopathy confidently identified in the abdomen or pelvis.  Reproductive: Status post hysterectomy. Left ovary is unremarkable in appearance. Right adnexal mass has regressed when compared to the prior examination, currently measuring 7.8 x 7.4 x 7.7 cm (axial image 51 of series 2 and coronal image 51 of series 5).  Other: Previously noted peritoneal implants in the low pelvis appear to have regressed compared to the prior examination. No new peritoneal implants confidently identified. No significant volume of ascites. No pneumoperitoneum.  Musculoskeletal: There are no aggressive appearing lytic or blastic lesions noted in the visualized portions of the skeleton.  IMPRESSION: 1. Today's study demonstrates a positive response to therapy with regression of previously noted right adnexal mass and peritoneal implants in the low pelvis. 2. However, there is a new pleural-based nodule in the right lower lobe concerning for a metastatic lesion. 3. Colonic diverticulosis without evidence of acute diverticulitis at this time. 4. Aortic atherosclerosis.  Aortic Atherosclerosis (ICD10-I70.0).   Electronically Signed By: Trudie Reed M.D. On: 01/24/2024 10:40

## 2024-02-12 NOTE — Progress Notes (Signed)
 Gynecologic Oncology Return Clinic Visit  Date of Service: 02/12/2024 Referring Provider: Artis Delay, MD [address] [place],  [place] [address]  Assessment & Plan: Lori Conrad is a 78 y.o. 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 Eugene Garnet, now s/p 4 cycles of NACT (last given on 01/26/24). She presents today for discussion of interval debulking surgery.   Ovarian cancer: - S/p 4C NACT. - Interval imaging on 01/24/24 overall with great response to treatment. CT chest on 02/08/24 to follow-up pleural lesion. - Has had great response with CA125 down to 60.6 at last chemo. - Recommend proceeding with interval debulking surgery at this time. 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 NGS HRD neg. - Reviewed possible need for bowel surgery, possible diverting ostomy pending intraoperative findings.   Patient was consented for: Diagnostic laparoscopy, robotic versus open total hysterectomy, bilateral salpingo-oophorectomy, omentectomy, tumor debulking, possible bowel surgery, possible ostomy on 02/20/24.  The risks of surgery were discussed in detail and she understands these to including but not limited to bleeding requiring a blood transfusion, infection, injury to adjacent organs (including but not limited to the bowels, bladder, ureters, nerves, blood vessels), thromboembolic events, wound separation, hernia, vaginal cuff separation, possible risk of lymphedema and lymphocyst if lymphadenectomy performed, unforseen complication, possible need for re-exploration, medical complications such as heart attack, stroke, pneumonia, and possible permanent ostomy.  If the patient experiences any of these events, she understands that her hospitalization or recovery may be prolonged and that she may need to take additional  medications for a prolonged period. The patient will receive DVT and antibiotic prophylaxis as indicated. She voiced a clear understanding. She had the opportunity to ask questions and informed consent was obtained today. She wishes to proceed.  She was given prescriptions and instructions for preoperative bowel prep. Will plan for preoperative ostomy marking in case. Will likely plan for at least 2 weeks postoperative chemoprophylaxis, possibly 4 weeks pending robotic versus laparotomy. She does not require preoperative clearance. Her METs are >4.  All preoperative instructions were reviewed. Postoperative expectations were also reviewed. Written handouts were provided to the patient.   RTC postop  Clide Cliff, MD Gynecologic Oncology   Medical Decision Making I personally spent  TOTAL 45 minutes face-to-face and non-face-to-face in the care of this patient, which includes all pre, intra, and post visit time on the date of service.    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 EXAM: IMPLANTED PORT A CATH PLACEMENT WITH ULTRASOUND AND FLUOROSCOPIC GUIDANCE COMPARISON:  None Available. MEDICATIONS: None. ANESTHESIA/SEDATION: Moderate (conscious) sedation was employed during this procedure. A total of Versed 1 mg and Fentanyl 50 mcg was administered intravenously. Moderate Sedation Time: 15 minutes. The patient's level of consciousness and vital signs were monitored continuously by radiology nursing throughout the procedure under my direct supervision. CONTRAST:  None FLUOROSCOPY TIME:  One mGy COMPLICATIONS: None immediate.  PROCEDURE: The procedure, risks, benefits, and alternatives were explained to the patient. Questions regarding the procedure were encouraged and answered. The patient understands and consents to the procedure. The right neck  and chest were prepped with chlorhexidine in a sterile fashion, and a sterile drape was applied covering the operative field. Maximum barrier sterile technique with sterile gowns and gloves were used for the procedure. A timeout was performed prior to the initiation of the procedure. Ultrasound was used to examine the jugular vein which was compressible and free of internal echoes. A skin marker was used to demarcate the planned venotomy and port pocket incision sites. Local anesthesia was provided to these sites and the subcutaneous tunnel track with 1% lidocaine with 1:100,000 epinephrine. A small incision was created at the jugular access site and blunt dissection was performed of the subcutaneous tissues. Under ultrasound guidance, the jugular vein was accessed with a 21 ga micropuncture needle and an 0.018" wire was inserted to the superior vena cava. Real-time ultrasound guidance was utilized for vascular access including the acquisition of a permanent ultrasound image documenting patency of the accessed vessel. A 5 Fr micopuncture set was then used, through which a 0.035" Rosen wire was passed under fluoroscopic guidance into the inferior vena cava. An 8 Fr dilator was then placed over the wire. A subcutaneous port pocket was then created along the upper chest wall utilizing a combination of sharp and blunt dissection. The pocket was irrigated with sterile saline, packed with gauze, and observed for hemorrhage. A single lumen plastic power injectable port was chosen for placement. The 8 Fr catheter was tunneled from the port pocket site to the venotomy incision. The port was placed in the pocket. The external catheter was trimmed to appropriate length. The dilator was exchanged for an 8  Fr peel-away sheath under fluoroscopic guidance. The catheter was then placed through the sheath and the sheath was removed. Final catheter positioning was confirmed and documented with a fluoroscopic spot radiograph. The port was accessed with a Huber needle, aspirated, and flushed with heparinized saline. The deep dermal layer of the port pocket incision was closed with interrupted 3-0 Vicryl suture. Dermabond was then placed over the port pocket and neck incisions. The patient tolerated the procedure well without immediate post procedural complication. FINDINGS: After catheter placement, the tip lies within the superior cavoatrial junction. The catheter aspirates and flushes normally and is ready for immediate use. IMPRESSION: Successful placement of a power injectable Port-A-Cath via the right internal jugular vein. The catheter is ready for immediate use. Marliss Coots, MD [facility] Electronically Signed   By: Marliss Coots M.D.   On: 11/01/2023 13:22   MR ABDOMEN W WO CONTRAST  Result Date: 10/31/2023 CLINICAL DATA:  Pelvic mass.  Liver lesion EXAM: MRI ABDOMEN WITHOUT AND WITH CONTRAST TECHNIQUE: Multiplanar multisequence MR imaging of the abdomen was performed both before and after the administration of intravenous contrast. CONTRAST:  5mL GADAVIST GADOBUTROL 1 MMOL/ML IV SOLN COMPARISON:  CT 10/30/2023 and older. FINDINGS: Lower chest: Trace pleural fluid. Hepatobiliary: Numerous bright T2, low T1 nonenhancing foci identified consistent with benign cystic lesions. Many of these are under 15 mm. There are some larger foci identified such as segment 4 measuring 4.3 cm and caudate measuring 2.8 cm. Prior CT did demonstrate 1 lesion which is more complex anteriorly in the left hepatic lobe, segment 3 which on today's examination when taking into account motion show some progressive enhancement, is bright on T2 but not as bright as simple fluid and  consistent with a small hemangioma. No specific aggressive liver lesion clearly identified today. Patent portal vein. Gallbladder is nondilated. No biliary ductal  dilatation. Pancreas: Ectatic pancreatic duct diffusely measuring up to 6 mm, severe. No pancreatic focal atrophy, abnormal enhancement or abnormal T1 signal. No restricted diffusion along the pancreas. Spleen:  Within normal limits in size and appearance. Adrenals/Urinary Tract: Adrenal glands are preserved. No enhancing renal mass or collecting system dilatation. Stomach/Bowel: Visualized bowel is nondilated. This includes visualized portions of the small and large bowel. The stomach is underdistended. Vascular/Lymphatic: Normal caliber aorta and IVC. Atherosclerotic changes along the aorta. Circumaortic left renal vein. Once again there is a abnormal lymph node identified anterior to the aorta in the upper abdomen on series 1602, image 58 measuring 2.3 x 1.8 cm. Few other small retroperitoneal nodes identified. Other:  Trace ascites.  Mesenteric stranding. Musculoskeletal: Curvature and degenerative changes along the spine. IMPRESSION: Multiple benign-appearing liver lesions including cysts and 1 hemangioma. Persistent enlarged upper abdominal retroperitoneal lymph node. Additional smaller but prominent nodes as well. With the pelvic findings these are worrisome for potential spread of neoplasm. Mild ascites. Electronically Signed   By: Karen Kays M.D.   On: 10/31/2023 14:10   MR PELVIS W WO CONTRAST  Result Date: 10/31/2023 CLINICAL DATA:  Pelvic mass EXAM: MRI PELVIS WITHOUT AND WITH CONTRAST TECHNIQUE: Multiplanar multisequence MR imaging of the pelvis was performed both before and after administration of intravenous contrast. CONTRAST:  5mL GADAVIST GADOBUTROL 1 MMOL/ML IV SOLN COMPARISON:  CT 10/30/2019 FINDINGS: Urinary Tract: Bladder is mildly distended with fluid. There is some mass effect along the posterior aspect of the bladder related  to the adjacent complex mass. The bladder itself appears grossly intact. No abnormal wall enhancement. Grossly preserved course of the urethra. Bowel: The visualized bowel in the pelvis is nondilated. However there is lobular masslike area along the distal sigmoid colon with the areas of heterogeneous enhancement. The dominant lesion in this location on series 4, image 29 measures 3.7 by 2.6 cm. There are several adjacent soft tissue nodules as well such as just posterior to the cervix and anterior to the bowel measuring 10 mm on series 22 image 30. Focus superior left lateral series 22, image 26 measures 2.3 x 1.7 cm. Additional foci elsewhere dependently in the pelvis including the presacral space. Vascular/Lymphatic: Atherosclerotic changes identified along the iliac vessels. No separate nodal enlargement identified. Reproductive: Uterus measures 8.2 by 2.0 by 3.4 cm. Endometrial stripe is less than 3 mm. Slightly heterogeneous myometrium. Anterior to the uterus is a large complex cystic and solid mass with heterogeneous enhancement of the solid component. Lesion measures 11.5 by 7.6 by 7.4 cm. The more solid component is right lateral inferior with a aggressive in enhancement measuring proximally 7.7 by 5.6 cm. Cephalocaudal length 8.5 cm. The cystic component more towards the left has a dimensions approaching 8.7 cm. There is surrounding free fluid and edema. Although this more in the central pelvis towards midline a right ovary is not seen as a separate structure. There is what appears to be a small postmenopausal of the left ovary measuring 15 mm. An ovarian neoplasm is a strong consideration. Other:  Small amount of free fluid in the pelvis.  Edema. Musculoskeletal: Curvature of the spine. Moderate degenerative changes of the lumbar spine with disc bulging and areas of stenosis greatest at L4-5. Degenerative changes of the pelvis and hips as well. Study is somewhat limited due to some artifacts postcontrast  axial dataset as well as study being performed as a standard pelvis rather than a gynecologic pelvis exam. Please see separate dictation of abdomen MRI.  IMPRESSION: Large complex cystic and solid pelvic mass centrally measuring up to 11.5 x 7.6 x 7.4 cm. Based on overall appearance this has worrisome for a neoplasm including an ovarian malignancy. Small amount of ascites in the pelvis. Soft tissue enhancing aggressive nodules along the course of the sigmoid colon as well as in the adjacent fat and presacral spaces. With the larger central mass this very well could be spread of disease to adjacent structures rather than a primary colonic process but correlate with symptoms and if needed colonoscopy. Please see separate dictation of abdominal MRI. Electronically Signed   By: Karen Kays M.D.   On: 10/31/2023 14:01   CT CHEST W CONTRAST  Result Date: 10/31/2023 CLINICAL DATA:  78 year old female with suspected gynecologic malignancy. * Tracking Code: BO * EXAM: CT CHEST WITH CONTRAST TECHNIQUE: Multidetector CT imaging of the chest was performed during intravenous contrast administration. RADIATION DOSE REDUCTION: This exam was performed according to the departmental dose-optimization program which includes automated exposure control, adjustment of the mA and/or kV according to patient size and/or use of iterative reconstruction technique. CONTRAST:  75mL OMNIPAQUE IOHEXOL 350 MG/ML SOLN COMPARISON:  None Available. FINDINGS: Cardiovascular: The heart size is mildly enlarged. No pericardial effusion. Aortic atherosclerosis and coronary artery calcification. Mediastinum/Nodes: Trachea and esophagus appear unremarkable. The right lobe of thyroid gland appears surgically absent. No mediastinal or hilar adenopathy. Lungs/Pleura: No pleural effusion identified. Subsegmental atelectasis identified within the lingula and bilateral posterior lung bases. No signs of interstitial edema or airspace consolidation. No  suspicious pulmonary nodule identified to suggest lung metastases. Upper Abdomen: No acute abnormality. Multiple liver cysts. Enlarged lymph node within the portal caval region measures 1.5 cm, image 155/3. Defer to report from CT AP dated 10/30/2019 for and same-day MRI of the abdomen pelvis for further details. Musculoskeletal: Mild curvature of the thoracic spine and lumbar spine is convex towards the left. Multilevel degenerative disc disease. No acute or suspicious osseous lesions. IMPRESSION: 1. No signs of metastatic disease to the chest. 2. Areas of subsegmental atelectasis noted within bilateral posterior lung bases and lingula. 3. Enlarged upper abdominal lymph node as above. In the setting of a known malignancy this is concerning for nodal metastasis. 4. Coronary artery calcifications. 5.  Aortic Atherosclerosis (ICD10-I70.0). Electronically Signed   By: Signa Kell M.D.   On: 10/31/2023 05:57   CT ABDOMEN PELVIS W CONTRAST  Result Date: 10/30/2023 CLINICAL DATA:  Abdominal pain. EXAM: CT ABDOMEN AND PELVIS WITH CONTRAST TECHNIQUE: Multidetector CT imaging of the abdomen and pelvis was performed using the standard protocol following bolus administration of intravenous contrast. RADIATION DOSE REDUCTION: This exam was performed according to the departmental dose-optimization program which includes automated exposure control, adjustment of the mA and/or kV according to patient size and/or use of iterative reconstruction technique. CONTRAST:  75mL OMNIPAQUE IOHEXOL 350 MG/ML SOLN COMPARISON:  Limb 1424 FINDINGS: Lower chest: No acute findings. Hepatobiliary: Multiple hepatic cysts evident. Scattered tiny hypodensities in the liver parenchyma are too small to characterize but are statistically most likely benign. No followup imaging is recommended. Tiny subcapsular lesion measured previously at 9 mm in the anterior left liver is stable on image 23/3 today, nonspecific. There is no evidence for  gallstones, gallbladder wall thickening, or pericholecystic fluid. No intrahepatic or extrahepatic biliary dilation. Pancreas: Dilatation of the pancreatic duct to the head and body of pancreas is similar to prior. Spleen: No splenomegaly. No suspicious focal mass lesion. Adrenals/Urinary Tract: No adrenal nodule or mass. Kidneys unremarkable.  No evidence for hydroureter. Bladder is distended. Stomach/Bowel: Stomach is unremarkable. No gastric wall thickening. No evidence of outlet obstruction. Duodenum is normally positioned as is the ligament of Treitz. No small bowel wall thickening. No small bowel dilatation. Diverticular changes are noted in the left colon without evidence of diverticulitis. Vascular/Lymphatic: 16 mm short axis portal caval lymph node seen on 22/3. No para-aortic lymphadenopathy. No pelvic sidewall lymphadenopathy. Reproductive: Multiple uterine fibroids evident. As noted on prior study there is an area of the anterior cervix that appears to obliterate the fat plane between the cervix and the posterior wall of the bladder (see sagittal 86/7). Small soft tissue nodules are again noted in the cul-de-sac some of which may pertain to diverticuli, but others raise concern for peritoneal nodularity (see images 61 and 56 of series 3). Other: No substantial free fluid. Musculoskeletal: No worrisome lytic or sclerotic osseous abnormality. IMPRESSION: 1. Multiple uterine fibroids. As noted on prior study there is an area of the anterior cervix that appears to obliterate the fat plane between the cervix and the posterior wall of the bladder. This is concerning for a cervical mass. Gynecologic consultation recommended. 2. Small soft tissue nodules in the cul-de-sac some of which may relate to diverticuli, but others raise concern for peritoneal nodularity. Attention on follow-up recommended. PET-CT may prove helpful to further evaluate 3. 16 mm short axis portal caval lymph node, metastatic disease not  excluded. 4. Left colonic diverticulosis without diverticulitis. Electronically Signed   By: Kennith Center M.D.   On: 10/30/2023 13:08   CT ABDOMEN PELVIS W CONTRAST  Result Date: 10/26/2023 CLINICAL DATA:  Pt w/ abnormal [place]; mass in pelvis; no h/o cancer; no pain; no urinary issues EXAM: CT ABDOMEN AND PELVIS WITH CONTRAST TECHNIQUE: Multidetector CT imaging of the abdomen and pelvis was performed using the standard protocol following bolus administration of intravenous contrast. RADIATION DOSE REDUCTION: This exam was performed according to the departmental dose-optimization program which includes automated exposure control, adjustment of the mA and/or kV according to patient size and/or use of iterative reconstruction technique. CONTRAST:  ISOVUE-300 IOPAMIDOL (ISOVUE-300) INJECTION 61% COMPARISON:  Ultrasound pelvis 09/01/2023, ultrasound thyroid 10/10/2023 FINDINGS: Lower chest: Mitral annular calcification. Aortic valve leaflet calcification. No acute abnormality. Hepatobiliary: Multiple fluid density lesions scattered throughout the left right hepatic lobe. There an indeterminate 0.9 cm left hepatic lobe hypodensity with Hounsfield unit of 77 (2:29). No gallstones, gallbladder wall thickening, or pericholecystic fluid. No biliary dilatation. Pancreas: No focal lesion. Normal pancreatic contour. No surrounding inflammatory changes. No main pancreatic ductal dilatation. Spleen: Normal in size without focal abnormality. Adrenals/Urinary Tract: No adrenal nodule bilaterally. Bilateral kidneys enhance symmetrically. No hydronephrosis. No hydroureter. The urinary bladder is unremarkable. On delayed imaging, there is no urothelial wall thickening and there are no filling defects in the opacified portions of the bilateral collecting systems or ureters. Stomach/Bowel: Stomach is within normal limits. No evidence of small bowel wall thickening or dilatation. Increased stool burden proximal to the distal  sigmoid colon mass with stool throughout the ascending, transverse, descending colon. Short segment of distal sigmoid colon irregular bowel wall thickening (5:26, 2:62). No large bowel luminal dilatation. Colonic diverticulosis appendix appears normal. Vascular/Lymphatic: No abdominal aorta or iliac aneurysm. 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 No abdominal, pelvic, or inguinal lymphadenopathy. Reproductive: There is a heterogeneous solid and cystic 11 x 9 cm mass arising from the uterine fundus. Finding is noted to  invade into the urinary bladder dome (6:81, 5:56 close) where there is loss of intraperitoneal and lower ring of the urinary bladder wall margin. The mass is noted to abut and appears to be inseparable from a short segment of distal sigmoid colon in the region of irregular bowel wall thickening. Other: No intraperitoneal free fluid. No intraperitoneal free gas. No organized fluid collection. Musculoskeletal: No abdominal wall hernia or abnormality. No suspicious lytic or blastic osseous lesions. No acute displaced fracture. Multilevel severe degenerative changes of the spine. Grade 1 anterolisthesis of L4 on L5 and L5 on S1. Mild retrolisthesis of L2 on L3 and L3 on L4. Dextroscoliosis centered at the L3-L4 level. IMPRESSION: 1. An 11 x 9 cm heterogeneous solid and cystic mass arises from the uterine fundus and is noted to invade the urinary bladder dome wall as well as the distal sigmoid colon. No associated bowel obstruction; however, constipation proximal to irregular bowel wall thickening/mass. No associated stercoral colitis. Finding consistent with malignancy. Recommend gynecologic consultation. When the patient is clinically stable and able to follow directions and hold their breath (preferably as an outpatient) further evaluation with dedicated MRI with and without contrast should be considered. 2. Indeterminate  0.9 cm left hepatic lobe hypodense lesion with a density of 77 HU. Question metastasis versus primary hepatic lesion. 3. Stool throughout the colon 4. Colonic diverticulosis with no acute diverticulitis. 5. 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 6.  Aortic Atherosclerosis (ICD10-I70.0)-severe. These results will be called to the ordering clinician or representative by the Radiologist Assistant, and communication documented in the PACS or [employer]. Electronically Signed   By: Tish Frederickson M.D.   On: 10/26/2023 18:42   DG BONE DENSITY (DXA)  Result Date: 10/26/2023 EXAM: DUAL X-RAY ABSORPTIOMETRY (DXA) FOR BONE MINERAL DENSITY IMPRESSION: Referring Physician:  Henrine Screws Your patient completed a bone mineral density test using GE Lunar iDXA system (analysis version: 16). Technologist: BEC PATIENT: Name: Timiyah, Romito Patient ID: [id number] Birth Date: Aug 05, 1946 Height: 60.5 in. Sex: Female Measured: 10/26/2023 Weight: 110.2 lbs. Indications: Advanced Age, Caucasian, Estrogen Deficient, Height Loss (781.91), History of Osteoporosis, Levothyroxine, Postmenopausal Fractures: Left Ankle Treatments: Calcium (E943.0), Vitamin D (E933.5) ASSESSMENT: The BMD measured at Forearm Radius 33% is 0.624 g/cm2 with a T-score of -2.9. This patient's diagnostic category is OSTEOPOROSIS according to World Health Organization [facility]) criteria. Comparison to 10/16/2019. Since the prior study, there has been a SIGNIFICANT DECREASE in bone mineral density of the hips (-4.6%). The lumbar spine was excluded due to being excluded from prior exam. The quality of the exam is good. Site Region Measured Date Measured Age YA BMD Significant CHANGE T-score Right Forearm Radius 33% 10/26/2023 78.0 -2.9 0.624 g/cm2 Right Forearm Radius 33% 10/16/2019 74.0 -2.6 0.653 g/cm2 DualFemur Neck Left 10/26/2023 78.0 -0.6 0.952 g/cm2 DualFemur Neck Left 10/16/2019 74.0 -0.5 0.962 g/cm2 DualFemur Total Mean 10/26/2023 78.0 -0.7  0.918 g/cm2 * DualFemur Total Mean 10/16/2019 74.0 -0.4 0.962 g/cm2 World Health Organization [facility]) criteria for post-menopausal, Caucasian Women: Normal       T-score at or above -1 SD Osteopenia   T-score between -1 and -2.5 SD Osteoporosis T-score at or below -2.5 SD RECOMMENDATION: 1. All patients should optimize calcium and vitamin D intake. 2. Consider FDA-approved medical therapies in postmenopausal women and men aged 11 years and older, based on the following: a. A hip or vertebral (clinical or morphometric) fracture. b. T-score = -2.5 at the femoral neck or spine after appropriate evaluation to exclude  secondary causes. c. Low bone mass (T-score between -1.0 and -2.5 at the femoral neck or spine) and a 10-year probability of a hip fracture = 3% or a 10-year probability of a major osteoporosis-related fracture = 20% based on the [place]-adapted WHO algorithm. d. Clinician judgment and/or patient preferences may indicate treatment for people with 10-year fracture probabilities above or below these levels. FOLLOW-UP: Patients with diagnosis of osteoporosis or at high risk for fracture should have regular bone mineral density tests.? Patients eligible for Medicare are allowed routine testing every 2 years.? The testing frequency can be increased to one year for patients who have rapidly progressing disease, are receiving or discontinuing medical therapy to restore bone mass, or have additional risk factors. I have reviewed this study and agree with the findings. [facility] Electronically Signed   By: Harmon Pier M.D.   On: 10/26/2023 12:30      11/08/2023 Pathology Results   SURGICAL PATHOLOGY CASE: [id number] PATIENT: [facility] Surgical Pathology Report  Clinical History: Pelvic mass, suspected malignancy (crm)   FINAL MICROSCOPIC DIAGNOSIS:  A. PELVIC SIDEWALL NODULE, RIGHT, EXCISION: - High-grade serous carcinoma, see comment  B. ABDOMINAL WALL #1, ANTERIOR, EXCISION: -  High-grade serous carcinoma  COMMENT:  A.  Immunohistochemical stain show that the tumor cells are positive for CK7, PAX8, and p16 (diffuse overexpression).  Immunostain for p53 shows a clonal null expression pattern.  Immunostains for CK20 is negative. This immunoprofile is consistent with the above interpretation and suggestive of an ovarian primary.    11/14/2023 Initial Diagnosis   Malignant ovarian neoplasm, right (HCC)   11/14/2023 Cancer Staging   Staging form: Ovary, Fallopian Tube, and Primary Peritoneal Carcinoma, AJCC 8th Edition - Clinical stage from 11/14/2023: FIGO Stage IIIC (cT3c, cN1, cM0) - Signed by Artis Delay, MD on 11/14/2023 Stage prefix: Initial diagnosis   11/20/2023 Tumor Marker   Patient's tumor was tested for the following markers: CA-125. Results of the tumor marker test revealed 169.   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  No other deleterious variants detected in [facility] CancerNext-Expanded +RNAinsight Panel.  Report date is 12/26/2023.   The CancerNext-Expanded gene panel offered by [facility] and includes sequencing, rearrangement, and RNA analysis for the following 76 genes: AIP, ALK, APC, ATM, AXIN2, BAP1, BARD1, BMPR1A, BRCA1, BRCA2, BRIP1, CDC73, CDH1, CDK4, CDKN1B, CDKN2A, CEBPA, CHEK2, CTNNA1, DDX41, DICER1, ETV6, FH, FLCN, GATA2, LZTR1, MAX, MBD4, MEN1, MET, MLH1, MSH2, MSH3, MSH6, MUTYH, NF1, NF2, NTHL1, PALB2, PHOX2B, PMS2, POT1, PRKAR1A, PTCH1, PTEN, RAD51C, RAD51D, RB1, RET, RUNX1, SDHA, SDHAF2, SDHB, SDHC, SDHD, SMAD4, SMARCA4, SMARCB1, SMARCE1, STK11, SUFU, TMEM127, TP53, TSC1, TSC2, VHL, and WT1 (sequencing and deletion/duplication); EGFR, HOXB13, KIT, MITF, PDGFRA, POLD1, and POLE (sequencing only); EPCAM and GREM1 (deletion/duplication only).      01/05/2024 Tumor Marker   Patient's tumor was tested for the following markers: CA-125. Results of the tumor marker test revealed 130.   01/18/2024 Imaging   CT ABDOMEN PELVIS W CONTRAST Result Date: 01/24/2024 CLINICAL DATA:  78 year old female with history of ovarian cancer status post chemotherapy. Follow-up study. * Tracking Code: BO * EXAM: CT ABDOMEN AND PELVIS WITH CONTRAST TECHNIQUE: Multidetector CT imaging of the abdomen and pelvis was performed using the standard protocol following bolus administration of intravenous contrast. RADIATION  DOSE REDUCTION: This exam was performed according to the departmental dose-optimization program which includes automated exposure control, adjustment of the mA and/or kV according to patient size and/or use of iterative reconstruction technique. CONTRAST:  80mL OMNIPAQUE IOHEXOL 300 MG/ML  SOLN COMPARISON:  CT of the abdomen and pelvis 12/07/2023. FINDINGS: Lower chest: 2.8 x 0.7 cm new area of pleural-based nodularity in the right lower lobe (axial image 5 of series 2) concerning for probable metastatic lesion. Hepatobiliary: Multiple well-defined low-attenuation lesions scattered throughout the liver, largest of which are all compatible with simple cysts, measuring up to 4.4 x 2.7 cm in segment 4A. Multiple other subcentimeter low-attenuation lesions, too small to definitively characterize, but similar to the prior study and statistically likely to represent tiny cysts and/or biliary hamartomas. Gallbladder is unremarkable in appearance. Pancreas: No pancreatic mass. No pancreatic ductal dilatation. No pancreatic or peripancreatic fluid collections or inflammatory changes. Spleen: Unremarkable. Adrenals/Urinary Tract: Bilateral kidneys and bilateral adrenal glands are normal in appearance. No hydroureteronephrosis. Urinary bladder is unremarkable in appearance. Stomach/Bowel: Appearance of the stomach is normal. There is no pathologic dilatation of small bowel or  colon. Numerous colonic diverticula are noted, without definite focal surrounding inflammatory changes to indicate an acute diverticulitis at this time. Previously noted mass in the sigmoid colon is not readily apparent on today's examination. The appendix is not confidently identified and may be surgically absent. Regardless, there are no inflammatory changes noted adjacent to the cecum to suggest the presence of an acute appendicitis at this time. Vascular/Lymphatic: Atherosclerotic calcifications are noted throughout the abdominal aorta and pelvic vasculature. Retroaortic left renal vein (normal anatomical variant) incidentally noted. No definite lymphadenopathy confidently identified in the abdomen or pelvis. Reproductive: Status post hysterectomy. Left ovary is unremarkable in appearance. Right adnexal mass has regressed when compared to the prior examination, currently measuring 7.8 x 7.4 x 7.7 cm (axial image 51 of series 2 and coronal image 51 of series 5). Other: Previously noted peritoneal implants in the low pelvis appear to have regressed compared to the prior examination. No new peritoneal implants confidently identified. No significant volume of ascites. No pneumoperitoneum. Musculoskeletal: There are no aggressive appearing lytic or blastic lesions noted in the visualized portions of the skeleton. IMPRESSION: 1. Today's study demonstrates a positive response to therapy with regression of previously noted right adnexal mass and peritoneal implants in the low pelvis. 2. However, there is a new pleural-based nodule in the right lower lobe concerning for a metastatic lesion. 3. Colonic diverticulosis without evidence of acute diverticulitis at this time. 4. Aortic atherosclerosis. Aortic Atherosclerosis (ICD10-I70.0). Electronically Signed   By: Trudie Reed M.D.   On: 01/24/2024 10:40      01/26/2024 Tumor Marker   Patient's tumor was tested for the following markers: CA-125. Results of the  tumor marker test revealed 60.6.   02/03/2024 Imaging   CT Chest W Contrast Result Date: 02/08/2024 CLINICAL DATA:  Pulmonary nodule. Ovarian cancer. * Tracking Code: BO * EXAM: CT CHEST WITH CONTRAST TECHNIQUE: Multidetector CT imaging of the chest was performed during intravenous contrast administration. RADIATION DOSE REDUCTION: This exam was performed according to the departmental dose-optimization program which includes automated exposure control, adjustment of the mA and/or kV according to patient size and/or use of iterative reconstruction technique. CONTRAST:  75mL OMNIPAQUE IOHEXOL 300 MG/ML  SOLN COMPARISON:  CT abdomen pelvis 01/18/2024 and CT chest 10/30/2023. FINDINGS: Cardiovascular: Right IJ Port-A-Cath terminates in the low SVC. Atherosclerotic calcification of the aorta, aortic valve and coronary  arteries. Heart is at the upper limits of normal in size to mildly enlarged. No pericardial effusion. Enlarged pulmonic trunk. Mediastinum/Nodes: No pathologically enlarged mediastinal, hilar or axillary lymph nodes. Esophagus is grossly unremarkable. Lungs/Pleura: Subpleural scarring in the medial right lower lobe with somewhat of a plaque like area superiorly, measuring 0.7 x 2.7 cm (8/109). Finding is unchanged from 01/18/2024 and new from 10/30/2023. Otherwise, no suspicious pulmonary nodules. No pleural fluid. Airway is unremarkable. Upper Abdomen: Multiple hepatic cysts. Visualized portions of the liver, adrenal glands, kidneys, spleen, pancreas, stomach and bowel are otherwise grossly unremarkable. No upper abdominal adenopathy. Musculoskeletal: Degenerative changes in the spine. No worrisome lytic or sclerotic lesions. IMPRESSION: 1. Subpleural scarring in the medial right lower lobe with somewhat of a plaque-like area superiorly, as on 01/18/2024, new from 10/30/2023. Scarring or loculated pleural fluid is favored. Metastatic disease cannot be excluded. Recommend attention on follow-up. 2.  Otherwise, no evidence of metastatic disease in the chest. 3. Aortic atherosclerosis (ICD10-I70.0). Coronary artery calcification. 4. Enlarged pulmonic trunk, indicative of pulmonary arterial hypertension. Electronically Signed   By: Leanna Battles M.D.   On: 02/08/2024 14:09   CT ABDOMEN PELVIS W CONTRAST Result Date: 01/24/2024 CLINICAL DATA:  78 year old female with history of ovarian cancer status post chemotherapy. Follow-up study. * Tracking Code: BO * EXAM: CT ABDOMEN AND PELVIS WITH CONTRAST TECHNIQUE: Multidetector CT imaging of the abdomen and pelvis was performed using the standard protocol following bolus administration of intravenous contrast. RADIATION DOSE REDUCTION: This exam was performed according to the departmental dose-optimization program which includes automated exposure control, adjustment of the mA and/or kV according to patient size and/or use of iterative reconstruction technique. CONTRAST:  80mL OMNIPAQUE IOHEXOL 300 MG/ML  SOLN COMPARISON:  CT of the abdomen and pelvis 12/07/2023. FINDINGS: Lower chest: 2.8 x 0.7 cm new area of pleural-based nodularity in the right lower lobe (axial image 5 of series 2) concerning for probable metastatic lesion. Hepatobiliary: Multiple well-defined low-attenuation lesions scattered throughout the liver, largest of which are all compatible with simple cysts, measuring up to 4.4 x 2.7 cm in segment 4A. Multiple other subcentimeter low-attenuation lesions, too small to definitively characterize, but similar to the prior study and statistically likely to represent tiny cysts and/or biliary hamartomas. Gallbladder is unremarkable in appearance. Pancreas: No pancreatic mass. No pancreatic ductal dilatation. No pancreatic or peripancreatic fluid collections or inflammatory changes. Spleen: Unremarkable. Adrenals/Urinary Tract: Bilateral kidneys and bilateral adrenal glands are normal in appearance. No hydroureteronephrosis. Urinary bladder is unremarkable  in appearance. Stomach/Bowel: Appearance of the stomach is normal. There is no pathologic dilatation of small bowel or colon. Numerous colonic diverticula are noted, without definite focal surrounding inflammatory changes to indicate an acute diverticulitis at this time. Previously noted mass in the sigmoid colon is not readily apparent on today's examination. The appendix is not confidently identified and may be surgically absent. Regardless, there are no inflammatory changes noted adjacent to the cecum to suggest the presence of an acute appendicitis at this time. Vascular/Lymphatic: Atherosclerotic calcifications are noted throughout the abdominal aorta and pelvic vasculature. Retroaortic left renal vein (normal anatomical variant) incidentally noted. No definite lymphadenopathy confidently identified in the abdomen or pelvis. Reproductive: Status post hysterectomy. Left ovary is unremarkable in appearance. Right adnexal mass has regressed when compared to the prior examination, currently measuring 7.8 x 7.4 x 7.7 cm (axial image 51 of series 2 and coronal image 51 of series 5). Other: Previously noted peritoneal implants in the low pelvis appear to have  regressed compared to the prior examination. No new peritoneal implants confidently identified. No significant volume of ascites. No pneumoperitoneum. Musculoskeletal: There are no aggressive appearing lytic or blastic lesions noted in the visualized portions of the skeleton. IMPRESSION: 1. Today's study demonstrates a positive response to therapy with regression of previously noted right adnexal mass and peritoneal implants in the low pelvis. 2. However, there is a new pleural-based nodule in the right lower lobe concerning for a metastatic lesion. 3. Colonic diverticulosis without evidence of acute diverticulitis at this time. 4. Aortic atherosclerosis. Aortic Atherosclerosis (ICD10-I70.0). Electronically Signed   By: Trudie Reed M.D.   On: 01/24/2024  10:40        Interval History: Patient presents today with her son.  Reports that she has done well with chemotherapy.  Notes that she had fatigue with the first treatment and brain fog but has done better since with more recent treatments.  She lives in assisted living facility.     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      Dilated aortic root (HCC)    Hyperlipidemia    Hypothyroidism    Macular degeneration     Past Surgical History:  Procedure Laterality Date   ANKLE FRACTURE SURGERY Left    BROW LIFT Bilateral 05/17/2022   Procedure: BILATERAL UPPER BLEPHAROPLASTY WITH PTOSIS REPAIR;  Surgeon: Janne Napoleon, MD;  Location: [facility] OR;  Service: Plastics;  Laterality: Bilateral;  1.5 hours   BUNIONECTOMY Left    COLONOSCOPY     EYE SURGERY     FLEXIBLE SIGMOIDOSCOPY N/A 11/02/2023   Procedure: FLEXIBLE SIGMOIDOSCOPY;  Surgeon: Kerin Salen, MD;  Location: [facility] END[facility]COPY;  Service: Gastroenterology;  Laterality: N/A;   IR IMAGING GUIDED PORT INSERTION  11/01/2023   LAPAROSCOPY N/A 11/08/2023   Procedure: LAPAROSCOPY DIAGNOSTIC WITH BIOPSY;  Surgeon: Carver Fila, MD;  Location: [facility] ORS;  Service: Gynecology;  Laterality: N/A;   REFRACTIVE SURGERY     laser for macular degeneration   SHOULDER ARTHROSCOPY     TONSILLECTOMY      Family History  Problem Relation Age of Onset   Lung cancer Mother 33 - 62       smoked   Stroke Mother 7   Alzheimer's disease Father    CAD Father        95% occlusion of L Main & RCA, severe descending aorta, arterial & arteriolonephrosclerosis    Cholecystitis Father    Esophagitis Father    Cancer Brother 22 - 76       unknown type, agent orange exposure   Pancreatic cancer Daughter 17   BRCA 1/2 Neg Hx    Breast cancer Neg Hx    Colon cancer Neg Hx    Ovarian cancer Neg Hx    Endometrial cancer Neg Hx    Prostate  cancer Neg Hx     Social History   Socioeconomic History   Marital status: Widowed    Spouse name: Not on file   Number of children: 2   Years of education: Not on file   Highest education level: Not on file  Occupational History   Not on file  Tobacco Use   Smoking status: Former    Current packs/day: 0.00    Types: Cigarettes    Quit date: 05/15/1988    Years since quitting: 35.7   Smokeless tobacco: Never  Vaping Use  Vaping status: Never Used  Substance and Sexual Activity   Alcohol use: Yes    Alcohol/week: 14.0 standard drinks of alcohol    Types: 14 drink(s) per week    Comment: white wine   Drug use: No   Sexual activity: Not Currently    Partners: Male  Other Topics Concern   Not on file  Social History Narrative   Not on file   Social Drivers of Health   Financial Resource Strain: Not on file  Food Insecurity: No Food Insecurity (10/30/2023)   Hunger Vital Sign    Worried About Running Out of Food in the Last Year: Never true    Ran Out of Food in the Last Year: Never true  Transportation Needs: No Transportation Needs (10/30/2023)   PRAPARE - Administrator, Civil Service (Medical): No    Lack of Transportation (Non-Medical): No  Physical Activity: Not on file  Stress: Not on file  Social Connections: Not on file    Current Medications:  Current Outpatient Medications:    atorvastatin (LIPITOR) 40 MG tablet, Take 40 mg by mouth at bedtime., Disp: , Rfl:    Calcium Carb-Cholecalciferol (SUPER CALCIUM 600 + D 400 PO), Take 1 tablet by mouth daily., Disp: , Rfl:    Cholecalciferol (VITAMIN D) 50 MCG (2000 UT) tablet, Take 2,000 Units by mouth daily., Disp: , Rfl:    Coenzyme Q10 (COQ10) 400 MG CAPS, Take 400 mg by mouth daily., Disp: , Rfl:    dexamethasone (DECADRON) 4 MG tablet, Take 2 tabs at the night before and 2 tab the morning of chemotherapy, every 3 weeks, by mouth x 6 cycles, Disp: 24 tablet, Rfl: 6   doxylamine, Sleep, (UNISOM)  25 MG tablet, Take 25 mg by mouth at bedtime as needed for sleep., Disp: , Rfl:    Glucosamine-Chondroit-Vit C-Mn (GLUCOSAMINE 1500 COMPLEX PO), Take 1 tablet by mouth daily., Disp: , Rfl:    HYDROcodone-acetaminophen (NORCO/VICODIN) 5-325 MG tablet, Take 1-2 tablets by mouth every 4 (four) hours as needed for moderate pain (pain score 4-6) or severe pain (pain score 7-10)., Disp: 30 tablet, Rfl: 0   lactulose (CHRONULAC) 10 GM/15ML solution, Take 10.5 mLs (7 g total) by mouth 3 (three) times daily as needed for mild constipation., Disp: 236 mL, Rfl: 0   levothyroxine (SYNTHROID) 75 MCG tablet, Take 75 mcg by mouth daily before breakfast., Disp: , Rfl:    lidocaine-prilocaine (EMLA) cream, Apply to affected area once daily prn, Disp: 30 g, Rfl: 3   Magnesium 250 MG TABS, Take 250 mg by mouth daily., Disp: , Rfl:    meloxicam (MOBIC) 7.5 MG tablet, Take 7.5 mg by mouth daily., Disp: , Rfl:    Multiple Vitamins-Minerals (PRESERVISION AREDS 2+MULTI VIT PO), Take 1 tablet by mouth 2 (two) times daily., Disp: , Rfl:    Omega-3 Fatty Acids (OMEGA 3 500) 500 MG CAPS, Take 500 mg by mouth daily., Disp: , Rfl:    ondansetron (ZOFRAN) 8 MG tablet, Take 1 tablet (8 mg total) by mouth every 8 (eight) hours as needed for nausea or vomiting. Start on the third day after carboplatin., Disp: 30 tablet, Rfl: 1   polyethylene glycol (MIRALAX / GLYCOLAX) 17 g packet, Take 17 g by mouth 2 (two) times daily as needed for mild constipation., Disp: 14 each, Rfl: 0   prochlorperazine (COMPAZINE) 10 MG tablet, Take 1 tablet (10 mg total) by mouth every 6 (six) hours as needed for nausea or vomiting.,  Disp: 30 tablet, Rfl: 1   senna-docusate (SENOKOT-S) 8.6-50 MG tablet, Take 2 tablets by mouth 2 (two) times daily as needed for mild constipation. For 3 days after chemotherapy and then prn as needed., Disp: 120 tablet, Rfl: 2  Review of Symptoms: Complete 10-system review is negative except as above in Interval  History.  Physical Exam: BP 124/74 (BP Location: Right Arm, Patient Position: Sitting)   Pulse 87   Temp 98.8 F (37.1 C) (Oral)   Resp 18   Wt 109 lb 3.2 oz (49.5 kg)   SpO2 98%   BMI 20.63 kg/m  General: Alert, oriented, no acute distress. HEENT: Normocephalic, atraumatic. Neck symmetric without masses. Sclera anicteric.  Chest: Normal work of breathing. Clear to auscultation bilaterally.   Cardiovascular: Regular rate and rhythm, no murmurs. Abdomen: Soft, nontender.  Normoactive bowel sounds.  No masses appreciated.  Well-healed incisions. Extremities: Grossly normal range of motion.  Warm, well perfused.  No edema bilaterally. Skin: No rashes or lesions noted. Lymphatics: No cervical, supraclavicular, or inguinal adenopathy. GU: Normal appearing external genitalia without erythema, excoriation, or lesions.  Speculum exam reveals normal cervix and vagina.  Bimanual exam reveals right adnexal mass, somewhat mobile.  Exam chaperoned by Kimberly [place], CMA    Laboratory & Radiologic Studies: Lab Results  Component Value Date   CAN125 60.6 (H) 01/25/2024   CAN125 130.0 (H) 01/04/2024   CAN125 169.0 (H) 11/17/2023   CEA1 3.5 10/31/2023     CT Chest W Contrast 02/03/2024  Narrative CLINICAL DATA:  Pulmonary nodule. Ovarian cancer. * Tracking Code: BO *  EXAM: CT CHEST WITH CONTRAST  TECHNIQUE: Multidetector CT imaging of the chest was performed during intravenous contrast administration.  RADIATION DOSE REDUCTION: This exam was performed according to the departmental dose-optimization program which includes automated exposure control, adjustment of the mA and/or kV according to patient size and/or use of iterative reconstruction technique.  CONTRAST:  75mL OMNIPAQUE IOHEXOL 300 MG/ML  SOLN  COMPARISON:  CT abdomen pelvis 01/18/2024 and CT chest 10/30/2023.  FINDINGS: Cardiovascular: Right IJ Port-A-Cath terminates in the low SVC. Atherosclerotic calcification  of the aorta, aortic valve and coronary arteries. Heart is at the upper limits of normal in size to mildly enlarged. No pericardial effusion. Enlarged pulmonic trunk.  Mediastinum/Nodes: No pathologically enlarged mediastinal, hilar or axillary lymph nodes. Esophagus is grossly unremarkable.  Lungs/Pleura: Subpleural scarring in the medial right lower lobe with somewhat of a plaque like area superiorly, measuring 0.7 x 2.7 cm (8/109). Finding is unchanged from 01/18/2024 and new from 10/30/2023. Otherwise, no suspicious pulmonary nodules. No pleural fluid. Airway is unremarkable.  Upper Abdomen: Multiple hepatic cysts. Visualized portions of the liver, adrenal glands, kidneys, spleen, pancreas, stomach and bowel are otherwise grossly unremarkable. No upper abdominal adenopathy.  Musculoskeletal: Degenerative changes in the spine. No worrisome lytic or sclerotic lesions.  IMPRESSION: 1. Subpleural scarring in the medial right lower lobe with somewhat of a plaque-like area superiorly, as on 01/18/2024, new from 10/30/2023. Scarring or loculated pleural fluid is favored. Metastatic disease cannot be excluded. Recommend attention on follow-up. 2. Otherwise, no evidence of metastatic disease in the chest. 3. Aortic atherosclerosis (ICD10-I70.0). Coronary artery calcification. 4. Enlarged pulmonic trunk, indicative of pulmonary arterial hypertension.   Electronically Signed By: Leanna Battles M.D. On: 02/08/2024 14:09   CT ABDOMEN PELVIS W CONTRAST 01/18/2024  Narrative CLINICAL DATA:  78 year old female with history of ovarian cancer status post chemotherapy. Follow-up study. * Tracking Code: BO *  EXAM: CT ABDOMEN  AND PELVIS WITH CONTRAST  TECHNIQUE: Multidetector CT imaging of the abdomen and pelvis was performed using the standard protocol following bolus administration of intravenous contrast.  RADIATION DOSE REDUCTION: This exam was performed according to  the departmental dose-optimization program which includes automated exposure control, adjustment of the mA and/or kV according to patient size and/or use of iterative reconstruction technique.  CONTRAST:  80mL OMNIPAQUE IOHEXOL 300 MG/ML  SOLN  COMPARISON:  CT of the abdomen and pelvis 12/07/2023.  FINDINGS: Lower chest: 2.8 x 0.7 cm new area of pleural-based nodularity in the right lower lobe (axial image 5 of series 2) concerning for probable metastatic lesion.  Hepatobiliary: Multiple well-defined low-attenuation lesions scattered throughout the liver, largest of which are all compatible with simple cysts, measuring up to 4.4 x 2.7 cm in segment 4A. Multiple other subcentimeter low-attenuation lesions, too small to definitively characterize, but similar to the prior study and statistically likely to represent tiny cysts and/or biliary hamartomas. Gallbladder is unremarkable in appearance.  Pancreas: No pancreatic mass. No pancreatic ductal dilatation. No pancreatic or peripancreatic fluid collections or inflammatory changes.  Spleen: Unremarkable.  Adrenals/Urinary Tract: Bilateral kidneys and bilateral adrenal glands are normal in appearance. No hydroureteronephrosis. Urinary bladder is unremarkable in appearance.  Stomach/Bowel: Appearance of the stomach is normal. There is no pathologic dilatation of small bowel or colon. Numerous colonic diverticula are noted, without definite focal surrounding inflammatory changes to indicate an acute diverticulitis at this time. Previously noted mass in the sigmoid colon is not readily apparent on today's examination. The appendix is not confidently identified and may be surgically absent. Regardless, there are no inflammatory changes noted adjacent to the cecum to suggest the presence of an acute appendicitis at this time.  Vascular/Lymphatic: Atherosclerotic calcifications are noted throughout the abdominal aorta and pelvic  vasculature. Retroaortic left renal vein (normal anatomical variant) incidentally noted. No definite lymphadenopathy confidently identified in the abdomen or pelvis.  Reproductive: Status post hysterectomy. Left ovary is unremarkable in appearance. Right adnexal mass has regressed when compared to the prior examination, currently measuring 7.8 x 7.4 x 7.7 cm (axial image 51 of series 2 and coronal image 51 of series 5).  Other: Previously noted peritoneal implants in the low pelvis appear to have regressed compared to the prior examination. No new peritoneal implants confidently identified. No significant volume of ascites. No pneumoperitoneum.  Musculoskeletal: There are no aggressive appearing lytic or blastic lesions noted in the visualized portions of the skeleton.  IMPRESSION: 1. Today's study demonstrates a positive response to therapy with regression of previously noted right adnexal mass and peritoneal implants in the low pelvis. 2. However, there is a new pleural-based nodule in the right lower lobe concerning for a metastatic lesion. 3. Colonic diverticulosis without evidence of acute diverticulitis at this time. 4. Aortic atherosclerosis.  Aortic Atherosclerosis (ICD10-I70.0).   Electronically Signed By: Trudie Reed M.D. On: 01/24/2024 10:40

## 2024-02-13 ENCOUNTER — Other Ambulatory Visit: Payer: Self-pay

## 2024-02-13 ENCOUNTER — Other Ambulatory Visit: Payer: Self-pay | Admitting: Hematology and Oncology

## 2024-02-13 ENCOUNTER — Encounter (HOSPITAL_COMMUNITY): Payer: Self-pay

## 2024-02-13 NOTE — Progress Notes (Signed)
 Sent message, via epic in basket, requesting orders in epic from Careers adviser.

## 2024-02-13 NOTE — Progress Notes (Addendum)
 COVID Vaccine Completed:  Yes  Date of COVID positive in last 90 days:  PCP -  Cardiologist - Clotilde Dieter, DO (last OV 2023)  Chest x-ray -  CT chest 02-03-24 Epic EKG - 11-07-23 Epic Stress Test - 10-24-22 Epic ECHO - 11-29-23 Epic Cardiac Cath -  Pacemaker/ICD device last checked: Spinal Cord Stimulator: Cardiac CT - 09-26-22 Epic  Bowel Prep -   Sleep Study -  CPAP -   Fasting Blood Sugar -  Checks Blood Sugar _____ times a day  Last dose of GLP1 agonist-  N/A GLP1 instructions:  Hold 7 days before surgery    Last dose of SGLT-2 inhibitors-  N/A SGLT-2 instructions:  Hold 3 days before surgery    Blood Thinner Instructions:  Last dose:   Time: Aspirin Instructions: Last Dose:  Activity level:  Can go up a flight of stairs and perform activities of daily living without stopping and without symptoms of chest pain or shortness of breath.  Able to exercise without symptoms  Unable to go up a flight of stairs without symptoms of     Anesthesia review:   AAA, HTN, elevated coronary calcium score, enlarged pulmonic trunk on CT chest   Last chemo 01-26-24  Patient denies shortness of breath, fever, cough and chest pain at PAT appointment  Patient verbalized understanding of instructions that were given to them at the PAT appointment. Patient was also instructed that they will need to review over the PAT instructions again at home before surgery.

## 2024-02-13 NOTE — Patient Instructions (Signed)
 SURGICAL WAITING ROOM VISITATION Patients having surgery or a procedure may have no more than 2 support people in the waiting area - these visitors may rotate.    Children under the age of 88 must have an adult with them who is not the patient.  Due to an increase in RSV and influenza rates and associated hospitalizations, children ages 48 and under may not visit patients in [facility] hospitals.   If the patient needs to stay at the hospital during part of their recovery, the visitor guidelines for inpatient rooms apply. Pre-op nurse will coordinate an appropriate time for 1 support person to accompany patient in pre-op.  This support person may not rotate.    Please refer to the [facility] website for the visitor guidelines for Inpatients (after your surgery is over and you are in a regular room).       Your procedure is scheduled on: 02-20-24   Report to [facility] Main Entrance    Report to admitting at 10:45 AM   Call this number if you have problems the morning of surgery [phone]   Do not eat food :After Midnight.   After Midnight you may have the following liquids until 10:00 AM DAY OF SURGERY  Water Non-Citrus Juices (without pulp, NO RED-Apple, White grape, White cranberry) Black Coffee (NO MILK/CREAM OR CREAMERS, sugar ok)  Clear Tea (NO MILK/CREAM OR CREAMERS, sugar ok) regular and decaf                             Plain Jell-O (NO RED)                                           Fruit ices (not with fruit pulp, NO RED)                                     Popsicles (NO RED)                                                               Sports drinks like Gatorade (NO RED)  Drink 2 Pre-surgery Ensure the evening before surgery                    The day of surgery:  Drink ONE (1) Pre-Surgery Clear Ensure by 10:00 AM the morning of surgery. Drink in one sitting. Do not sip.  This drink was given to you during your hospital  pre-op appointment  visit. Nothing else to drink after completing the Pre-Surgery Clear Ensure          If you have questions, please contact your surgeon's office.   FOLLOW BOWEL PREP AND ANY ADDITIONAL PRE OP INSTRUCTIONS YOU RECEIVED FROM YOUR SURGEON'S OFFICE!!!     Oral Hygiene is also important to reduce your risk of infection.                                    Remember -  BRUSH YOUR TEETH THE MORNING OF SURGERY WITH YOUR REGULAR TOOTHPASTE   Do NOT smoke after Midnight   Take these medicines the morning of surgery with A SIP OF WATER:    Levothyroxine   If needed Hydrocodone, Ondansetron, Compazine  Stop all vitamins and herbal supplements 7 days before surgery                             You may not have any metal on your body including hair pins, jewelry, and body piercing             Do not wear make-up, lotions, powders, perfumes, or deodorant  Do not wear nail polish including gel and S&S, artificial/acrylic nails, or any other type of covering on natural nails including finger and toenails. If you have artificial nails, gel coating, etc. that needs to be removed by a nail salon please have this removed prior to surgery or surgery may need to be canceled/ delayed if the surgeon/ anesthesia feels like they are unable to be safely monitored.   Do not shave  48 hours prior to surgery.       Do not bring valuables to the hospital. [facility] IS NOT RESPONSIBLE   FOR VALUABLES.   Contacts, dentures or bridgework may not be worn into surgery.   Bring small overnight bag day of surgery.   DO NOT BRING YOUR HOME MEDICATIONS TO THE HOSPITAL. PHARMACY WILL DISPENSE MEDICATIONS LISTED ON YOUR MEDICATION LIST TO YOU DURING YOUR ADMISSION IN THE HOSPITAL!   Special Instructions: Bring a copy of your healthcare power of attorney and living will documents the day of surgery if you haven't scanned them before.              Please read over the following fact sheets you were given: IF YOU HAVE  QUESTIONS ABOUT YOUR PRE-OP INSTRUCTIONS PLEASE CALL [phone] [place]  If you received a COVID test during your pre-op visit  it is requested that you wear a mask when out in public, stay away from anyone that may not be feeling well and notify your surgeon if you develop symptoms. If you test positive for Covid or have been in contact with anyone that has tested positive in the last 10 days please notify you surgeon.  [place] - Preparing for Surgery Before surgery, you can play an important role.  Because skin is not sterile, your skin needs to be as free of germs as possible.  You can reduce the number of germs on your skin by washing with CHG (chlorahexidine gluconate) soap before surgery.  CHG is an antiseptic cleaner which kills germs and bonds with the skin to continue killing germs even after washing. Please DO NOT use if you have an allergy to CHG or antibacterial soaps.  If your skin becomes reddened/irritated stop using the CHG and inform your nurse when you arrive at Short Stay. Do not shave (including legs and underarms) for at least 48 hours prior to the first CHG shower.  You may shave your face/neck.  Please follow these instructions carefully:  1.  Shower with CHG Soap the night before surgery and the  morning of surgery.  2.  If you choose to wash your hair, wash your hair first as usual with your normal  shampoo.  3.  After you shampoo, rinse your hair and body thoroughly to remove the shampoo.  4.  Use CHG as you would any other liquid soap.  You can apply chg directly to the skin and wash.  Gently with a scrungie or clean washcloth.  5.  Apply the CHG Soap to your body ONLY FROM THE NECK DOWN.   Do   not use on face/ open                           Wound or open sores. Avoid contact with eyes, ears mouth and   genitals (private parts).                       Wash face,  Genitals (private parts) with your normal soap.             6.  Wash thoroughly,  paying special attention to the area where your    surgery  will be performed.  7.  Thoroughly rinse your body with warm water from the neck down.  8.  DO NOT shower/wash with your normal soap after using and rinsing off the CHG Soap.                9.  Pat yourself dry with a clean towel.            10.  Wear clean pajamas.            11.  Place clean sheets on your bed the night of your first shower and do not  sleep with pets. Day of Surgery : Do not apply any lotions/deodorants the morning of surgery.  Please wear clean clothes to the hospital/surgery center.  FAILURE TO FOLLOW THESE INSTRUCTIONS MAY RESULT IN THE CANCELLATION OF YOUR SURGERY  PATIENT SIGNATURE_________________________________  NURSE SIGNATURE__________________________________  ________________________________________________________________________   WHAT IS A BLOOD TRANSFUSION? Blood Transfusion Information  A transfusion is the replacement of blood or some of its parts. Blood is made up of multiple cells which provide different functions. Red blood cells carry oxygen and are used for blood loss replacement. White blood cells fight against infection. Platelets control bleeding. Plasma helps clot blood. Other blood products are available for specialized needs, such as hemophilia or other clotting disorders. BEFORE THE TRANSFUSION  Who gives blood for transfusions?  Healthy volunteers who are fully evaluated to make sure their blood is safe. This is blood bank blood. Transfusion therapy is the safest it has ever been in the practice of medicine. Before blood is taken from a donor, a complete history is taken to make sure that person has no history of diseases nor engages in risky social behavior (examples are intravenous drug use or sexual activity with multiple partners). The donor's travel history is screened to minimize risk of transmitting infections, such as malaria. The donated blood is tested for signs of  infectious diseases, such as HIV and hepatitis. The blood is then tested to be sure it is compatible with you in order to minimize the chance of a transfusion reaction. If you or a relative donates blood, this is often done in anticipation of surgery and is not appropriate for emergency situations. It takes many days to process the donated blood. RISKS AND COMPLICATIONS Although transfusion therapy is very safe and saves many lives, the main dangers of transfusion include:  Getting an infectious disease. Developing a transfusion reaction. This is an allergic reaction to something in the blood you were given. Every precaution is taken to prevent this. The decision  to have a blood transfusion has been considered carefully by your caregiver before blood is given. Blood is not given unless the benefits outweigh the risks. AFTER THE TRANSFUSION Right after receiving a blood transfusion, you will usually feel much better and more energetic. This is especially true if your red blood cells have gotten low (anemic). The transfusion raises the level of the red blood cells which carry oxygen, and this usually causes an energy increase. The nurse administering the transfusion will monitor you carefully for complications. HOME CARE INSTRUCTIONS  No special instructions are needed after a transfusion. You may find your energy is better. Speak with your caregiver about any limitations on activity for underlying diseases you may have. SEEK MEDICAL CARE IF:  Your condition is not improving after your transfusion. You develop redness or irritation at the intravenous (IV) site. SEEK IMMEDIATE MEDICAL CARE IF:  Any of the following symptoms occur over the next 12 hours: Shaking chills. You have a temperature by mouth above 102 F (38.9 C), not controlled by medicine. Chest, back, or muscle pain. People around you feel you are not acting correctly or are confused. Shortness of breath or difficulty  breathing. Dizziness and fainting. You get a rash or develop hives. You have a decrease in urine output. Your urine turns a dark color or changes to pink, red, or brown. Any of the following symptoms occur over the next 10 days: You have a temperature by mouth above 102 F (38.9 C), not controlled by medicine. Shortness of breath. Weakness after normal activity. The white part of the eye turns yellow (jaundice). You have a decrease in the amount of urine or are urinating less often. Your urine turns a dark color or changes to pink, red, or brown. Document Released: 11/25/2000 Document Revised: 02/20/2012 Document Reviewed: 07/14/2008 [facility] Patient Information 2014 [place], [place].  _______________________________________________________________________

## 2024-02-14 DIAGNOSIS — E782 Mixed hyperlipidemia: Secondary | ICD-10-CM | POA: Diagnosis not present

## 2024-02-14 DIAGNOSIS — E039 Hypothyroidism, unspecified: Secondary | ICD-10-CM | POA: Diagnosis not present

## 2024-02-14 DIAGNOSIS — Z131 Encounter for screening for diabetes mellitus: Secondary | ICD-10-CM | POA: Diagnosis not present

## 2024-02-15 ENCOUNTER — Telehealth: Payer: Self-pay | Admitting: *Deleted

## 2024-02-15 ENCOUNTER — Ambulatory Visit: Payer: Medicare HMO | Admitting: Hematology and Oncology

## 2024-02-15 ENCOUNTER — Encounter (HOSPITAL_COMMUNITY): Payer: Self-pay

## 2024-02-15 ENCOUNTER — Other Ambulatory Visit: Payer: Self-pay | Admitting: Gynecologic Oncology

## 2024-02-15 ENCOUNTER — Encounter (HOSPITAL_COMMUNITY)
Admission: RE | Admit: 2024-02-15 | Discharge: 2024-02-15 | Disposition: A | Discharge status: Home / Self Care | Payer: Medicare HMO | Source: Ambulatory Visit | Attending: Psychiatry | Admitting: Psychiatry

## 2024-02-15 ENCOUNTER — Other Ambulatory Visit: Payer: Medicare HMO

## 2024-02-15 ENCOUNTER — Telehealth: Payer: Self-pay

## 2024-02-15 ENCOUNTER — Other Ambulatory Visit: Payer: Self-pay

## 2024-02-15 VITALS — BP 135/84 | HR 80 | Temp 98.4°F | Resp 16 | Ht 61.0 in | Wt 107.4 lb

## 2024-02-15 DIAGNOSIS — Z87891 Personal history of nicotine dependence: Secondary | ICD-10-CM | POA: Insufficient documentation

## 2024-02-15 DIAGNOSIS — I77810 Thoracic aortic ectasia: Secondary | ICD-10-CM | POA: Diagnosis not present

## 2024-02-15 DIAGNOSIS — Z01810 Encounter for preprocedural cardiovascular examination: Secondary | ICD-10-CM | POA: Insufficient documentation

## 2024-02-15 DIAGNOSIS — E039 Hypothyroidism, unspecified: Secondary | ICD-10-CM | POA: Insufficient documentation

## 2024-02-15 DIAGNOSIS — I2510 Atherosclerotic heart disease of native coronary artery without angina pectoris: Secondary | ICD-10-CM | POA: Insufficient documentation

## 2024-02-15 DIAGNOSIS — C561 Malignant neoplasm of right ovary: Secondary | ICD-10-CM | POA: Diagnosis not present

## 2024-02-15 DIAGNOSIS — I700 Atherosclerosis of aorta: Secondary | ICD-10-CM | POA: Diagnosis not present

## 2024-02-15 DIAGNOSIS — E876 Hypokalemia: Secondary | ICD-10-CM

## 2024-02-15 DIAGNOSIS — Z01818 Encounter for other preprocedural examination: Secondary | ICD-10-CM | POA: Diagnosis present

## 2024-02-15 DIAGNOSIS — I10 Essential (primary) hypertension: Secondary | ICD-10-CM | POA: Diagnosis not present

## 2024-02-15 HISTORY — DX: Essential (primary) hypertension: I10

## 2024-02-15 HISTORY — DX: Malignant neoplasm of unspecified ovary: C56.9

## 2024-02-15 LAB — COMPREHENSIVE METABOLIC PANEL
ALT: 15 U/L (ref 0–44)
AST: 22 U/L (ref 15–41)
Albumin: 3.8 g/dL (ref 3.5–5.0)
Alkaline Phosphatase: 75 U/L (ref 38–126)
Anion gap: 8 (ref 5–15)
BUN: 15 mg/dL (ref 8–23)
CO2: 28 mmol/L (ref 22–32)
Calcium: 9.4 mg/dL (ref 8.9–10.3)
Chloride: 103 mmol/L (ref 98–111)
Creatinine, Ser: 0.62 mg/dL (ref 0.44–1.00)
GFR, Estimated: >60 mL/min (ref >60)
Glucose, Bld: 112 mg/dL — ABNORMAL HIGH (ref 70–99)
Potassium: 3.3 mmol/L — ABNORMAL LOW (ref 3.5–5.1)
Sodium: 139 mmol/L (ref 135–145)
Total Bilirubin: 0.6 mg/dL (ref 0.0–1.2)
Total Protein: 6.6 g/dL (ref 6.5–8.1)

## 2024-02-15 LAB — CBC WITH DIFFERENTIAL/PLATELET
Abs Immature Granulocytes: 0.08 10*3/uL — ABNORMAL HIGH (ref 0.00–0.07)
Basophils Absolute: 0.1 10*3/uL (ref 0.0–0.1)
Basophils Relative: 1 %
Eosinophils Absolute: 0 10*3/uL (ref 0.0–0.5)
Eosinophils Relative: 0 %
HCT: 44.4 % (ref 36.0–46.0)
Hemoglobin: 14 g/dL (ref 12.0–15.0)
Immature Granulocytes: 2 %
Lymphocytes Relative: 22 %
Lymphs Abs: 1 10*3/uL (ref 0.7–4.0)
MCH: 29.4 pg (ref 26.0–34.0)
MCHC: 31.5 g/dL (ref 30.0–36.0)
MCV: 93.1 fL (ref 80.0–100.0)
Monocytes Absolute: 0.6 10*3/uL (ref 0.1–1.0)
Monocytes Relative: 13 %
Neutro Abs: 2.9 10*3/uL (ref 1.7–7.7)
Neutrophils Relative %: 62 %
Platelets: 179 10*3/uL (ref 150–400)
RBC: 4.77 MIL/uL (ref 3.87–5.11)
RDW: 16.1 % — ABNORMAL HIGH (ref 11.5–15.5)
WBC: 4.7 10*3/uL (ref 4.0–10.5)
nRBC: 0 % (ref 0.0–0.2)

## 2024-02-15 MED ORDER — POTASSIUM CHLORIDE CRYS ER 20 MEQ PO TBCR: 20.0000 meq | EXTENDED_RELEASE_TABLET | Freq: Two times a day (BID) | ORAL | 0 refills | Status: DC

## 2024-02-15 NOTE — Consult Note (Signed)
 WOC Nurse requested for preoperative stoma site marking  Discussed surgical procedure and stoma creation with patient.  Explained role of the WOC nurse team.  Provided the patient with educational booklet and provided samples of pouching options.  Answered patient's questions.   Examined patient sitting, and standing in order to place the marking in the patient's visual field, away from any creases or abdominal contour issues and within the rectus muscle.  Attempted to mark below the patient's belt line, but this was not possible, since she wears her pants very high on the waist.   Marked for colostomy in the LLQ  __4__ cm to the left of the umbilicus and __7__cm below the umbilicus.  Marked for ileostomy in the RLQ  __4__cm to the right of the umbilicus and  __7__ cm below the umbilicus.  Patient's abdomen cleansed with CHG wipes at site markings, allowed to air dry prior to marking.Covered mark with thin film transparent dressing to preserve mark until date of surgery. Provided with marking pen and instructed to re-color in the sites if they begin to fade prior to surgery.   WOC Nurse team will follow up with patient after surgery for continued ostomy care and teaching if the patient receives an ostomy.  Thank-you,  Cammie Mcgee MSN, RN, CWOCN, [place], CNS [phone]

## 2024-02-15 NOTE — Telephone Encounter (Signed)
-----   Message from Doylene Bode sent at 02/15/2024  3:50 PM EST ----- Her potassium on her preop labs is 3.3. Please review potassium rich foods and I am going to send in replacement for her. Also will plan for recheck morning of surgery so please add on your list to call short stay on Monday afternoon or Tues. Thanks ----- Message ----- From: Leory Plowman, [facility] [place] Sent: 02/15/2024   2:32 PM EST To: Doylene Bode, NP

## 2024-02-15 NOTE — Telephone Encounter (Signed)
 Spoke with Hardesty from administration at [facility] [employer] in regards to Ms. Sobolewski instructions for bowel prep on Monday, 3/10 prior to her surgery. Allena Earing states and read all instructions back and verbalized understanding of all pre-op instructions and bowel prep instructions that are to start on Monday morning.  Rosalva Ferron that the office will call patient on Monday as well as [facility] and reiterate all instructions again. B[place]ky thanked the office for calling.

## 2024-02-15 NOTE — Telephone Encounter (Signed)
 I spoke to Ackley at [facility] assisted [facility]. She is aware of Potassium prescription being sent to pharmacy on file. Aware of instructions and pt to start it tomorrow and to take for 3 days. She voiced an understanding.   Pt's son, Lennon Alstrom, is also aware of potassium results

## 2024-02-15 NOTE — Progress Notes (Signed)
 Pre-op K+ at 3.3. Patient is scheduled to perform bowel prep on Monday, March 10. Plan for replacement and recheck morning of surgery. See CMA call.

## 2024-02-16 ENCOUNTER — Ambulatory Visit: Payer: Medicare HMO

## 2024-02-16 ENCOUNTER — Encounter (HOSPITAL_COMMUNITY): Payer: Self-pay

## 2024-02-16 NOTE — Anesthesia Preprocedure Evaluation (Addendum)
 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    Pulmonary former smoker   Pulmonary exam normal breath sounds clear to auscultation       Cardiovascular hypertension, + Peripheral Vascular Disease  Normal cardiovascular exam Rhythm:Regular Rate:Normal     Neuro/Psych negative neurological ROS  negative psych ROS   GI/Hepatic negative GI ROS, Neg liver ROS,,,  Endo/Other  Hypothyroidism    Renal/GU negative Renal ROS  negative genitourinary   Musculoskeletal negative musculoskeletal ROS (+)    Abdominal   Peds negative pediatric ROS (+)  Hematology negative hematology ROS (+)   Anesthesia Other Findings   Reproductive/Obstetrics negative OB ROS                             Anesthesia Physical Anesthesia Plan  ASA: 3  Anesthesia Plan: General   Post-op Pain Management: Minimal or no pain anticipated   Induction: Intravenous  PONV Risk Score and Plan: 3 and Ondansetron, Dexamethasone and Treatment may vary due to age or medical condition  Airway Management Planned: Oral ETT  Additional Equipment:   Intra-op Plan:   Post-operative Plan: Extubation in OR  Informed Consent: I have reviewed the patients History and Physical, chart, labs and discussed the procedure including the risks, benefits and alternatives for the proposed anesthesia with the patient or authorized representative who has indicated his/her understanding and acceptance.     Dental advisory given  Plan Discussed with: CRNA and Surgeon  Anesthesia Plan Comments: (See PAT note from 3/6 by Sherlie Ban PA-C  DISCUSSION: Lori Conrad  is a 78 yo female who presents to PAT prior to surgery above. PMH of former smoking, CAD (by CT), aortic atherosclerosis, HTN, dilated aortic root, hypothyroidism, ovarian cancer.   Patient follows with Oncology due to recently diagnosed ovarian cancer. Last seen on 01/25/24. She is undergoing neoadjuvant therapy and has a port a cath. She underwent a diagnostic lap with biopsy on 11/08/23 without complications.   Patient has been evaluated by Cardiology due to high coronary calcium score. Last seen in 2023. She had a stress test which was low risk and echo which showed LVH, grade I DD, dilated aortic root of 40mm.  DISCUSSION: Lori Conrad is a 78 yo female who presents to PAT prior to surgery above. PMH of former smoking, CAD (by CT), aortic atherosclerosis, HTN, dilated aortic root, hypothyroidism, ovarian cancer.   Patient follows with Oncology due to recently diagnosed ovarian cancer. Last seen on 01/25/24. She is undergoing neoadjuvant therapy and has a port a cath. She underwent a diagnostic lap with biopsy on 11/08/23 without complications.   Patient has been evaluated by Cardiology due to high coronary calcium score. Last seen in 2023. She had a stress test which was low risk and echo which showed LVH, grade I DD, dilated aortic root of 40mm.       Echo 11/29/23:   IMPRESSIONS      1. LVOT gradient, peak gradient 26 mmHg. no SAM. Left ventricular ejection fraction, by estimation, is 60 to 65%. The left ventricle has normal function. The left ventricle has no regional wall motion abnormalities. There is moderate asymmetric left  ventricular hypertrophy of the basal-septal segment. Left ventricular diastolic parameters are consistent with Grade I diastolic dysfunction (impaired relaxation).  2. Right ventricular systolic function is normal. The right ventricular size is normal. There is normal pulmonary artery systolic pressure.  3. The mitral valve is abnormal. No evidence of mitral  valve regurgitation. Moderate mitral annular calcification.  4. The aortic valve is tricuspid. Aortic valve regurgitation is mild.  5. Aneurysm of the ascending aorta, measuring 40 mm. There is moderate dilatation of the aortic root, measuring 39 mm.  6. The inferior vena cava is normal in size with greater than 50% respiratory variability, suggesting right atrial pressure of 3 mmHg.   Exercise nuclear stress test 10/24/2022:   Myocardial perfusion is normal. Overall LV systolic function is normal without regional wall motion abnormalities. Rest LV EF: 80%. Stress LV EF: 80%. Low risk study. Normal ECG stress. The patient exercised for 6 minutes and 39 seconds of a Bruce protocol, achieving approximately 8.05 METs and 85% MPHR. The heart rate response was normal. The blood pressure response was normal. No previous exam available for comparison.  )       Anesthesia Quick Evaluation

## 2024-02-16 NOTE — Progress Notes (Signed)
 Case: [id number] Date/Time: 02/20/24 1245   Procedures:      LAPAROSCOPY, DIAGNOSTIC     XI ROBOTIC ASSISTED TOTAL HYSTERECTOMY WITH BILATERAL SALPINGO OOPHORECTOMY VS. OPEN (Bilateral)     OMENTECTOMY     DEBULKING, ABDOMINAL     POSSIBLE BOWEL SURGERY     POSSIBLE OSTOMY   Anesthesia type: General   Pre-op diagnosis: OVARIAN CANCER   Location: [facility] ROOM 05 / [facility]   Surgeons: Lori Cliff, MD       DISCUSSION: Lori Conrad is a 78 yo female who presents to PAT prior to surgery above. PMH of former smoking, CAD (by CT), aortic atherosclerosis, HTN, dilated aortic root, hypothyroidism, ovarian cancer.  Patient follows with Oncology due to recently diagnosed ovarian cancer. Last seen on 01/25/24. She is undergoing neoadjuvant therapy and has a port a cath. She underwent a diagnostic lap with biopsy on 11/08/23 without complications.  Patient has been evaluated by Cardiology due to high coronary calcium score. Last seen in 2023. She had a stress test which was low risk and echo which showed LVH, grade I DD, dilated aortic root of 40mm.  VS: BP 135/84   Pulse 80   Temp 36.9 C (Oral)   Resp 16   Ht 5\' 1"  (1.549 m)   Wt 48.7 kg   SpO2 100%   BMI 20.29 kg/m   PROVIDERS: Oncology: Lori Delay, MD   LABS: Labs reviewed: Acceptable for surgery. K replaced by surgeon (all labs ordered are listed, but only abnormal results are displayed)  Labs Reviewed  CBC WITH DIFFERENTIAL/PLATELET - Abnormal; Notable for the following components:      Result Value   RDW 16.1 (*)    Abs Immature Granulocytes 0.08 (*)    All other components within normal limits  COMPREHENSIVE METABOLIC PANEL - Abnormal; Notable for the following components:   Potassium 3.3 (*)    Glucose, Bld 112 (*)    All other components within normal limits  TYPE AND SCREEN     IMAGES: CT Chest w Contrast 02/03/24:  IMPRESSION: 1. Subpleural scarring in the medial right lower lobe with somewhat of a plaque-like  area superiorly, as on 01/18/2024, new from 10/30/2023. Scarring or loculated pleural fluid is favored. Metastatic disease cannot be excluded. Recommend attention on follow-up. 2. Otherwise, no evidence of metastatic disease in the chest. 3. Aortic atherosclerosis (ICD10-I70.0). Coronary artery calcification. 4. Enlarged pulmonic trunk, indicative of pulmonary arterial hypertension.  EKG:   CV:  Echo 11/29/23:  IMPRESSIONS    1. LVOT gradient, peak gradient 26 mmHg. no SAM. Left ventricular ejection fraction, by estimation, is 60 to 65%. The left ventricle has normal function. The left ventricle has no regional wall motion abnormalities. There is moderate asymmetric left ventricular hypertrophy of the basal-septal segment. Left ventricular diastolic parameters are consistent with Grade I diastolic dysfunction (impaired relaxation).  2. Right ventricular systolic function is normal. The right ventricular size is normal. There is normal pulmonary artery systolic pressure.  3. The mitral valve is abnormal. No evidence of mitral valve regurgitation. Moderate mitral annular calcification.  4. The aortic valve is tricuspid. Aortic valve regurgitation is mild.  5. Aneurysm of the ascending aorta, measuring 40 mm. There is moderate dilatation of the aortic root, measuring 39 mm.  6. The inferior vena cava is normal in size with greater than 50% respiratory variability, suggesting right atrial pressure of 3 mmHg.  Exercise nuclear stress test 10/24/2022:  Myocardial perfusion is normal. Overall LV systolic function  is normal without regional wall motion abnormalities. Rest LV EF: 80%. Stress LV EF: 80%. Low risk study. Normal ECG stress. The patient exercised for 6 minutes and 39 seconds of a Bruce protocol, achieving approximately 8.05 METs and 85% MPHR. The heart rate response was normal. The blood pressure response was normal. No previous exam available for comparison.  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      Dilated aortic root (HCC)    Hyperlipidemia    Hypertension    Pt denies   Hypothyroidism    Macular degeneration    Ovarian cancer [facility])     Past Surgical History:  Procedure Laterality Date   ANKLE FRACTURE SURGERY Left    BROW LIFT Bilateral 05/17/2022   Procedure: BILATERAL UPPER BLEPHAROPLASTY WITH PTOSIS REPAIR;  Surgeon: Janne Napoleon, MD;  Location: [facility];  Service: Plastics;  Laterality: Bilateral;  1.5 hours   BUNIONECTOMY Left    COLONOSCOPY     EYE SURGERY     FLEXIBLE SIGMOIDOSCOPY N/A 11/02/2023   Procedure: FLEXIBLE SIGMOIDOSCOPY;  Surgeon: Kerin Salen, MD;  Location: [facility]COPY;  Service: Gastroenterology;  Laterality: N/A;   IR IMAGING GUIDED PORT INSERTION  11/01/2023   LAPAROSCOPY N/A 11/08/2023   Procedure: LAPAROSCOPY DIAGNOSTIC WITH BIOPSY;  Surgeon: Carver Fila, MD;  Location: [facility];  Service: Gynecology;  Laterality: N/A;   REFRACTIVE SURGERY     laser for macular degeneration   SHOULDER ARTHROSCOPY     TONSILLECTOMY      MEDICATIONS:  atorvastatin (LIPITOR) 40 MG tablet   [START ON 02/19/2024] bisacodyl (DULCOLAX) 5 MG EC tablet   Cholecalciferol (VITAMIN D) 50 MCG (2000 UT) tablet   dexamethasone (DECADRON) 4 MG tablet   doxylamine, Sleep, (UNISOM) 25 MG tablet   Ensure (ENSURE)   HYDROcodone-acetaminophen (NORCO/VICODIN) 5-325 MG tablet   lactulose (CHRONULAC) 10 GM/15ML solution   levothyroxine (SYNTHROID) 75 MCG tablet   lidocaine-prilocaine (EMLA) cream   meloxicam (MOBIC) 7.5 MG tablet   [START ON 02/19/2024] metroNIDAZOLE (FLAGYL) 500 MG tablet   Nutritional Supplements (NUTRITIONAL SHAKE) LIQD   ondansetron (ZOFRAN) 8 MG tablet   polyethylene glycol (MIRALAX / GLYCOLAX) 17 g packet   [START ON 02/19/2024] polyethylene glycol powder (MIRALAX) 17 GM/SCOOP powder   potassium chloride SA (KLOR-CON M) 20 MEQ tablet    prochlorperazine (COMPAZINE) 10 MG tablet   senna-docusate (SENOKOT-S) 8.6-50 MG tablet   No current facility-administered medications for this encounter.   Lori Blanco [facility] Short Stay/Anesthesiology [facility] Phone [phone] 02/16/2024 10:07 AM

## 2024-02-19 ENCOUNTER — Telehealth: Payer: Self-pay | Admitting: *Deleted

## 2024-02-19 NOTE — Progress Notes (Signed)
 Phone call received from the [facility] requesting that the Patient have a recheck on Potassium before surgery tomorrow.  Orders already placed by Dr Mauro Kaufmann PA/

## 2024-02-19 NOTE — Telephone Encounter (Signed)
 Telephone call to check on pre-operative status.  Patient & [facility] Leaving compliant with pre-operative instructions and bowel prep instructions.   Reinforced only clear liquids until 0945. Patient to arrive at 1045.  No questions or concerns voiced.  Instructed to call for any needs.

## 2024-02-19 NOTE — Discharge Instructions (Signed)
 AFTER SURGERY INSTRUCTIONS   Return to work: 4-6 weeks if applicable   You will need to be on a blood thinner, Eliquis, starting tomorrow evening (evening of February 23, 2024) for a total of twelve days then you can stop the medication. This is to help prevent blood clots after surgery.  YOU WILL TAKE ELIQUIS TWICE DAILY. THIS IS A BLOOD THINNER TO PREVENT BLOOD CLOTS SO YOU WILL NEED TO MONITOR FOR BLEEDING AND CALL THE OFFICE.  AVOID USE OF NSAIDS (IBUPROFEN, NAPROXEN) WHILE TAKING THE BLOOD THINNER.   Activity: 1. Be up and out of the bed during the day.  Take a nap if needed.  You may walk up steps but be careful and use the hand rail.  Stair climbing will tire you more than you think, you may need to stop part way and rest.    2. No lifting or straining for 6 weeks over 10 pounds. No pushing, pulling, straining for 6 weeks.   3. No driving for 2-95 days when the following criteria have been met: Do not drive if you are taking narcotic pain medicine and make sure that your reaction time has returned.    4. You can shower as soon as the next day after surgery. Shower daily.  Use your regular soap and water (not directly on the incision) and pat your incision(s) dry afterwards; don't rub.  No tub baths or submerging your body in water until cleared by your surgeon. If you have the soap that was given to you by pre-surgical testing that was used before surgery, you do not need to use it afterwards because this can irritate your incisions.    5. No sexual activity and nothing in the vagina for 10-12 weeks.   6. You may experience a small amount of clear drainage from your incisions, which is normal.  If the drainage persists, increases, or changes color please call the office.   7. Do not use creams, lotions, or ointments such as neosporin on your incisions after surgery until advised by your surgeon because they can cause removal of the dermabond glue on your incisions.     8. You may  experience vaginal spotting after surgery or when the stitches at the top of the vagina begin to dissolve.  The spotting is normal but if you experience heavy bleeding, call our office.   9. Take Tylenol first for pain if you are able to take these medication and only use narcotic pain medication for severe pain not relieved by the Tylenol.  Monitor your Tylenol intake to a max of 4,000 mg in a 24 hour period.    Diet: 1. Low sodium Heart Healthy Diet is recommended but you are cleared to resume your normal (before surgery) diet after your procedure.   2. It is safe to use a laxative, such as Miralax or Colace, if you have difficulty moving your bowels before surgery.    Wound Care: 1. Keep clean and dry.  Shower daily.   Reasons to call the Doctor: Fever - Oral temperature greater than 100.4 degrees Fahrenheit Foul-smelling vaginal discharge Difficulty urinating Nausea and vomiting Increased pain at the site of the incision that is unrelieved with pain medicine. Difficulty breathing with or without chest pain New calf pain especially if only on one side Sudden, continuing increased vaginal bleeding with or without clots.   Contacts: For questions or concerns you should contact:   Dr. Clide Cliff at [phone]   Warner Mccreedy, NP  at [phone]   After Hours: call [phone] and have the GYN Oncologist paged/contacted (after 5 pm or on the weekends). You will speak with an after hours RN and let he or she know you have had surgery.   Messages sent via mychart are for non-urgent matters and are not responded to after hours so for urgent needs, please call the after hours number.

## 2024-02-19 NOTE — Telephone Encounter (Signed)
 Spoke with Annabelle Harman from [employer]. Made aware of patient's order for a repeat potassium level tomorrow morning 3/11 upon arrival to short stay prior to her surgery with Dr. Alvester Morin. Order is in and will be released tomorrow.

## 2024-02-20 ENCOUNTER — Encounter (HOSPITAL_COMMUNITY): Payer: Self-pay | Admitting: Psychiatry

## 2024-02-20 ENCOUNTER — Encounter (HOSPITAL_COMMUNITY)
Admission: RE | Disposition: A | Discharge status: Nursing Home (Includes ICF) | Payer: Self-pay | Source: Home / Self Care | Attending: Psychiatry

## 2024-02-20 ENCOUNTER — Inpatient Hospital Stay (HOSPITAL_COMMUNITY): Payer: Self-pay | Admitting: Medical

## 2024-02-20 ENCOUNTER — Other Ambulatory Visit: Payer: Self-pay

## 2024-02-20 ENCOUNTER — Inpatient Hospital Stay (HOSPITAL_COMMUNITY): Payer: Self-pay | Admitting: Anesthesiology

## 2024-02-20 ENCOUNTER — Inpatient Hospital Stay (HOSPITAL_COMMUNITY)
Admission: RE | Admit: 2024-02-20 | Discharge: 2024-02-22 | DRG: 736 | Disposition: A | Discharge status: Nursing Home (Includes ICF) | Payer: Medicare HMO | Attending: Psychiatry | Admitting: Psychiatry

## 2024-02-20 DIAGNOSIS — E785 Hyperlipidemia, unspecified: Secondary | ICD-10-CM | POA: Diagnosis not present

## 2024-02-20 DIAGNOSIS — N888 Other specified noninflammatory disorders of cervix uteri: Secondary | ICD-10-CM | POA: Diagnosis not present

## 2024-02-20 DIAGNOSIS — Z8249 Family history of ischemic heart disease and other diseases of the circulatory system: Secondary | ICD-10-CM | POA: Diagnosis not present

## 2024-02-20 DIAGNOSIS — C561 Malignant neoplasm of right ovary: Secondary | ICD-10-CM | POA: Diagnosis not present

## 2024-02-20 DIAGNOSIS — K5730 Diverticulosis of large intestine without perforation or abscess without bleeding: Secondary | ICD-10-CM | POA: Diagnosis present

## 2024-02-20 DIAGNOSIS — E876 Hypokalemia: Secondary | ICD-10-CM

## 2024-02-20 DIAGNOSIS — N83511 Torsion of right ovary and ovarian pedicle: Secondary | ICD-10-CM | POA: Diagnosis not present

## 2024-02-20 DIAGNOSIS — Z79890 Hormone replacement therapy (postmenopausal): Secondary | ICD-10-CM

## 2024-02-20 DIAGNOSIS — N858 Other specified noninflammatory disorders of uterus: Secondary | ICD-10-CM | POA: Diagnosis not present

## 2024-02-20 DIAGNOSIS — Z87891 Personal history of nicotine dependence: Secondary | ICD-10-CM

## 2024-02-20 DIAGNOSIS — E039 Hypothyroidism, unspecified: Secondary | ICD-10-CM | POA: Diagnosis present

## 2024-02-20 DIAGNOSIS — Z801 Family history of malignant neoplasm of trachea, bronchus and lung: Secondary | ICD-10-CM

## 2024-02-20 DIAGNOSIS — K529 Noninfective gastroenteritis and colitis, unspecified: Secondary | ICD-10-CM | POA: Diagnosis not present

## 2024-02-20 DIAGNOSIS — Z823 Family history of stroke: Secondary | ICD-10-CM | POA: Diagnosis not present

## 2024-02-20 DIAGNOSIS — C569 Malignant neoplasm of unspecified ovary: Secondary | ICD-10-CM

## 2024-02-20 DIAGNOSIS — I10 Essential (primary) hypertension: Secondary | ICD-10-CM

## 2024-02-20 DIAGNOSIS — Z820 Family history of epilepsy and other diseases of the nervous system: Secondary | ICD-10-CM

## 2024-02-20 DIAGNOSIS — Z800 Family history of malignant neoplasm of digestive organs: Secondary | ICD-10-CM

## 2024-02-20 DIAGNOSIS — Z01818 Encounter for other preprocedural examination: Secondary | ICD-10-CM

## 2024-02-20 DIAGNOSIS — I739 Peripheral vascular disease, unspecified: Secondary | ICD-10-CM | POA: Diagnosis present

## 2024-02-20 DIAGNOSIS — K682 Retroperitoneal fibrosis: Secondary | ICD-10-CM | POA: Diagnosis present

## 2024-02-20 HISTORY — PX: APPENDECTOMY: SHX54

## 2024-02-20 HISTORY — PX: ROBOTIC ASSISTED TOTAL HYSTERECTOMY WITH BILATERAL SALPINGO OOPHERECTOMY: SHX6086

## 2024-02-20 LAB — TYPE AND SCREEN
ABO/RH(D): B POS
Antibody Screen: NEGATIVE

## 2024-02-20 LAB — CBC
HCT: 45.6 % (ref 36.0–46.0)
Hemoglobin: 14.5 g/dL (ref 12.0–15.0)
MCH: 29.4 pg (ref 26.0–34.0)
MCHC: 31.8 g/dL (ref 30.0–36.0)
MCV: 92.5 fL (ref 80.0–100.0)
Platelets: 196 10*3/uL (ref 150–400)
RBC: 4.93 MIL/uL (ref 3.87–5.11)
RDW: 15.9 % — ABNORMAL HIGH (ref 11.5–15.5)
WBC: 5.1 10*3/uL (ref 4.0–10.5)
nRBC: 0 % (ref 0.0–0.2)

## 2024-02-20 LAB — BASIC METABOLIC PANEL
Anion gap: 11 (ref 5–15)
BUN: 15 mg/dL (ref 8–23)
CO2: 27 mmol/L (ref 22–32)
Calcium: 9.6 mg/dL (ref 8.9–10.3)
Chloride: 99 mmol/L (ref 98–111)
Creatinine, Ser: 0.49 mg/dL (ref 0.44–1.00)
GFR, Estimated: >60 mL/min (ref >60)
Glucose, Bld: 167 mg/dL — ABNORMAL HIGH (ref 70–99)
Potassium: 3.5 mmol/L (ref 3.5–5.1)
Sodium: 137 mmol/L (ref 135–145)

## 2024-02-20 SURGERY — HYSTERECTOMY, TOTAL, ROBOT-ASSISTED, LAPAROSCOPIC, WITH BILATERAL SALPINGO-OOPHORECTOMY
Anesthesia: General | Laterality: Bilateral

## 2024-02-20 MED ORDER — FENTANYL CITRATE PF 50 MCG/ML IJ SOSY
PREFILLED_SYRINGE | INTRAMUSCULAR | Status: AC
  Administered 2024-02-20: 50 ug via INTRAVENOUS
  Filled 2024-02-20: qty 1

## 2024-02-20 MED ORDER — ENSURE PRE-SURGERY PO LIQD: 296.0000 mL | Freq: Once | ORAL | Status: DC

## 2024-02-20 MED ORDER — ROCURONIUM BROMIDE 100 MG/10ML IV SOLN
INTRAVENOUS | Status: DC | PRN
  Administered 2024-02-20: 20 mg via INTRAVENOUS
  Administered 2024-02-20: 10 mg via INTRAVENOUS
  Administered 2024-02-20: 20 mg via INTRAVENOUS
  Administered 2024-02-20: 60 mg via INTRAVENOUS

## 2024-02-20 MED ORDER — ONDANSETRON HCL 4 MG/2ML IJ SOLN: 4.0000 mg | Freq: Once | INTRAMUSCULAR | Status: DC | PRN

## 2024-02-20 MED ORDER — DEXMEDETOMIDINE HCL IN NACL 80 MCG/20ML IV SOLN
INTRAVENOUS | Status: AC
  Filled 2024-02-20: qty 20

## 2024-02-20 MED ORDER — SUGAMMADEX SODIUM 200 MG/2ML IV SOLN
INTRAVENOUS | Status: DC | PRN
  Administered 2024-02-20: 200 mg via INTRAVENOUS

## 2024-02-20 MED ORDER — LIDOCAINE HCL 2 % IJ SOLN
INTRAMUSCULAR | Status: AC
  Filled 2024-02-20: qty 20

## 2024-02-20 MED ORDER — ALBUMIN HUMAN 5 % IV SOLN
INTRAVENOUS | Status: AC
Start: 2024-02-20 — End: ?
  Filled 2024-02-20: qty 250

## 2024-02-20 MED ORDER — MEPERIDINE HCL 50 MG/ML IJ SOLN: 6.2500 mg | INTRAMUSCULAR | Status: DC | PRN

## 2024-02-20 MED ORDER — ENSURE PRE-SURGERY PO LIQD
592.0000 mL | Freq: Once | ORAL | Status: DC
  Filled 2024-02-20: qty 592

## 2024-02-20 MED ORDER — TRAMADOL HCL 50 MG PO TABS
50.0000 mg | ORAL_TABLET | Freq: Four times a day (QID) | ORAL | Status: DC | PRN
  Administered 2024-02-20 – 2024-02-22 (×2): 50 mg via ORAL
  Filled 2024-02-20 (×2): qty 1

## 2024-02-20 MED ORDER — BUPIVACAINE HCL (PF) 0.25 % IJ SOLN
INTRAMUSCULAR | Status: DC | PRN
  Administered 2024-02-20: 16 mL

## 2024-02-20 MED ORDER — ROCURONIUM BROMIDE 10 MG/ML (PF) SYRINGE
PREFILLED_SYRINGE | INTRAVENOUS | Status: AC
  Filled 2024-02-20: qty 10

## 2024-02-20 MED ORDER — HEPARIN SODIUM (PORCINE) 5000 UNIT/ML IJ SOLN
5000.0000 [IU] | INTRAMUSCULAR | Status: AC
Start: 2024-02-20 — End: 2024-02-20
  Administered 2024-02-20: 5000 [IU] via SUBCUTANEOUS
  Filled 2024-02-20: qty 1

## 2024-02-20 MED ORDER — SODIUM CHLORIDE (PF) 0.9 % IJ SOLN
INTRAMUSCULAR | Status: DC | PRN
  Administered 2024-02-20: 56 mL

## 2024-02-20 MED ORDER — ONDANSETRON HCL 4 MG/2ML IJ SOLN: 4.0000 mg | Freq: Four times a day (QID) | INTRAMUSCULAR | Status: DC | PRN

## 2024-02-20 MED ORDER — SUGAMMADEX SODIUM 200 MG/2ML IV SOLN
INTRAVENOUS | Status: AC
  Filled 2024-02-20: qty 2

## 2024-02-20 MED ORDER — DEXAMETHASONE SODIUM PHOSPHATE 10 MG/ML IJ SOLN
INTRAMUSCULAR | Status: AC
  Filled 2024-02-20: qty 1

## 2024-02-20 MED ORDER — FENTANYL CITRATE (PF) 100 MCG/2ML IJ SOLN
INTRAMUSCULAR | Status: AC
Start: 2024-02-20 — End: ?
  Filled 2024-02-20: qty 2

## 2024-02-20 MED ORDER — PROPOFOL 1000 MG/100ML IV EMUL
INTRAVENOUS | Status: AC
  Filled 2024-02-20: qty 100

## 2024-02-20 MED ORDER — OXYCODONE HCL 5 MG PO TABS: 5.0000 mg | ORAL_TABLET | Freq: Once | ORAL | Status: DC | PRN

## 2024-02-20 MED ORDER — ONDANSETRON HCL 4 MG/2ML IJ SOLN
INTRAMUSCULAR | Status: DC | PRN
  Administered 2024-02-20: 4 mg via INTRAVENOUS

## 2024-02-20 MED ORDER — ENOXAPARIN SODIUM 40 MG/0.4ML IJ SOSY
40.0000 mg | PREFILLED_SYRINGE | INTRAMUSCULAR | Status: DC
  Administered 2024-02-21 – 2024-02-22 (×2): 40 mg via SUBCUTANEOUS
  Filled 2024-02-20 (×2): qty 0.4

## 2024-02-20 MED ORDER — FENTANYL CITRATE PF 50 MCG/ML IJ SOSY
PREFILLED_SYRINGE | INTRAMUSCULAR | Status: AC
  Filled 2024-02-20: qty 1

## 2024-02-20 MED ORDER — DEXAMETHASONE SODIUM PHOSPHATE 10 MG/ML IJ SOLN
INTRAMUSCULAR | Status: DC | PRN
  Administered 2024-02-20: 10 mg via INTRAVENOUS

## 2024-02-20 MED ORDER — ATORVASTATIN CALCIUM 20 MG PO TABS
40.0000 mg | ORAL_TABLET | Freq: Every day | ORAL | Status: DC
  Administered 2024-02-20 – 2024-02-21 (×2): 40 mg via ORAL
  Filled 2024-02-20 (×2): qty 2

## 2024-02-20 MED ORDER — ACETAMINOPHEN 500 MG PO TABS
1000.0000 mg | ORAL_TABLET | Freq: Four times a day (QID) | ORAL | Status: DC
  Administered 2024-02-20 – 2024-02-22 (×7): 1000 mg via ORAL
  Filled 2024-02-20 (×7): qty 2

## 2024-02-20 MED ORDER — BUPIVACAINE LIPOSOME 1.3 % IJ SUSP
INTRAMUSCULAR | Status: AC
  Filled 2024-02-20: qty 20

## 2024-02-20 MED ORDER — ACETAMINOPHEN 325 MG PO TABS: 325.0000 mg | ORAL_TABLET | ORAL | Status: DC | PRN

## 2024-02-20 MED ORDER — POVIDONE-IODINE 10 % EX SWAB: 2.0000 | Freq: Once | CUTANEOUS | Status: DC

## 2024-02-20 MED ORDER — BUPIVACAINE HCL (PF) 0.25 % IJ SOLN
INTRAMUSCULAR | Status: AC
  Filled 2024-02-20: qty 30

## 2024-02-20 MED ORDER — ACETAMINOPHEN 160 MG/5ML PO SOLN: 325.0000 mg | ORAL | Status: DC | PRN

## 2024-02-20 MED ORDER — METRONIDAZOLE 500 MG/100ML IV SOLN
500.0000 mg | INTRAVENOUS | Status: AC
  Administered 2024-02-20: 500 mg via INTRAVENOUS
  Filled 2024-02-20: qty 100

## 2024-02-20 MED ORDER — DEXMEDETOMIDINE HCL IN NACL 80 MCG/20ML IV SOLN
INTRAVENOUS | Status: DC | PRN
  Administered 2024-02-20: 6 ug via INTRAVENOUS
  Administered 2024-02-20: 4 ug via INTRAVENOUS

## 2024-02-20 MED ORDER — LACTATED RINGERS IR SOLN
Status: DC | PRN
  Administered 2024-02-20: 1000 mL

## 2024-02-20 MED ORDER — OXYCODONE HCL 5 MG PO TABS
5.0000 mg | ORAL_TABLET | ORAL | Status: DC | PRN
Start: 2024-02-20 — End: 2024-02-22
  Administered 2024-02-21 (×2): 5 mg via ORAL
  Administered 2024-02-22 (×2): 10 mg via ORAL
  Filled 2024-02-20: qty 1
  Filled 2024-02-20 (×2): qty 2
  Filled 2024-02-20: qty 1

## 2024-02-20 MED ORDER — STERILE WATER FOR IRRIGATION IR SOLN
Status: DC | PRN
  Administered 2024-02-20: 1000 mL

## 2024-02-20 MED ORDER — CEFAZOLIN SODIUM-DEXTROSE 2-4 GM/100ML-% IV SOLN
2.0000 g | INTRAVENOUS | Status: AC
  Administered 2024-02-20: 2 g via INTRAVENOUS
  Filled 2024-02-20: qty 100

## 2024-02-20 MED ORDER — ORAL CARE MOUTH RINSE: 15.0000 mL | Freq: Once | OROMUCOSAL | Status: AC

## 2024-02-20 MED ORDER — LACTATED RINGERS IV SOLN: INTRAVENOUS | Status: DC | PRN

## 2024-02-20 MED ORDER — PHENYLEPHRINE HCL-NACL 20-0.9 MG/250ML-% IV SOLN
INTRAVENOUS | Status: DC | PRN
  Administered 2024-02-20: 40 ug/min via INTRAVENOUS

## 2024-02-20 MED ORDER — FENTANYL CITRATE (PF) 100 MCG/2ML IJ SOLN
INTRAMUSCULAR | Status: AC
  Filled 2024-02-20: qty 2

## 2024-02-20 MED ORDER — LIDOCAINE HCL (PF) 2 % IJ SOLN
INTRAMUSCULAR | Status: DC | PRN
  Administered 2024-02-20: 1.5 mg/kg/h via INTRADERMAL

## 2024-02-20 MED ORDER — HYDROMORPHONE HCL 1 MG/ML IJ SOLN: 0.5000 mg | INTRAMUSCULAR | Status: DC | PRN

## 2024-02-20 MED ORDER — PROPOFOL 500 MG/50ML IV EMUL
INTRAVENOUS | Status: DC | PRN
  Administered 2024-02-20: 50 ug/kg/min via INTRAVENOUS

## 2024-02-20 MED ORDER — FENTANYL CITRATE PF 50 MCG/ML IJ SOSY
25.0000 ug | PREFILLED_SYRINGE | INTRAMUSCULAR | Status: DC | PRN
  Administered 2024-02-20 (×2): 25 ug via INTRAVENOUS

## 2024-02-20 MED ORDER — ONDANSETRON HCL 4 MG PO TABS: 4.0000 mg | ORAL_TABLET | Freq: Four times a day (QID) | ORAL | Status: DC | PRN

## 2024-02-20 MED ORDER — KCL IN DEXTROSE-NACL 20-5-0.45 MEQ/L-%-% IV SOLN
INTRAVENOUS | Status: AC
  Filled 2024-02-20: qty 1000

## 2024-02-20 MED ORDER — PROPOFOL 500 MG/50ML IV EMUL
INTRAVENOUS | Status: AC
  Filled 2024-02-20: qty 50

## 2024-02-20 MED ORDER — LIDOCAINE HCL (CARDIAC) PF 100 MG/5ML IV SOSY
PREFILLED_SYRINGE | INTRAVENOUS | Status: DC | PRN
  Administered 2024-02-20: 100 mg via INTRAVENOUS

## 2024-02-20 MED ORDER — SODIUM CHLORIDE (PF) 0.9 % IJ SOLN
INTRAMUSCULAR | Status: AC
  Filled 2024-02-20: qty 20

## 2024-02-20 MED ORDER — ACETAMINOPHEN 500 MG PO TABS
1000.0000 mg | ORAL_TABLET | ORAL | Status: AC
  Administered 2024-02-20: 1000 mg via ORAL
  Filled 2024-02-20: qty 2

## 2024-02-20 MED ORDER — OXYCODONE HCL 5 MG/5ML PO SOLN: 5.0000 mg | Freq: Once | ORAL | Status: DC | PRN

## 2024-02-20 MED ORDER — FENTANYL CITRATE (PF) 100 MCG/2ML IJ SOLN
INTRAMUSCULAR | Status: DC | PRN
Start: 2024-02-20 — End: 2024-02-20
  Administered 2024-02-20 (×2): 50 ug via INTRAVENOUS
  Administered 2024-02-20 (×4): 25 ug via INTRAVENOUS

## 2024-02-20 MED ORDER — ALBUMIN HUMAN 5 % IV SOLN: INTRAVENOUS | Status: DC | PRN

## 2024-02-20 MED ORDER — PROPOFOL 10 MG/ML IV BOLUS
INTRAVENOUS | Status: AC
  Filled 2024-02-20: qty 20

## 2024-02-20 MED ORDER — LEVOTHYROXINE SODIUM 75 MCG PO TABS
75.0000 ug | ORAL_TABLET | Freq: Every day | ORAL | Status: DC
  Administered 2024-02-21 – 2024-02-22 (×2): 75 ug via ORAL
  Filled 2024-02-20 (×2): qty 1

## 2024-02-20 MED ORDER — LIDOCAINE HCL (PF) 2 % IJ SOLN
INTRAMUSCULAR | Status: AC
  Filled 2024-02-20: qty 5

## 2024-02-20 MED ORDER — DEXAMETHASONE SODIUM PHOSPHATE 4 MG/ML IJ SOLN
4.0000 mg | INTRAMUSCULAR | Status: DC
Start: 2024-02-20 — End: 2024-02-20

## 2024-02-20 MED ORDER — CHLORHEXIDINE GLUCONATE 0.12 % MT SOLN
15.0000 mL | Freq: Once | OROMUCOSAL | Status: AC
  Administered 2024-02-20: 15 mL via OROMUCOSAL

## 2024-02-20 MED ORDER — PROPOFOL 10 MG/ML IV BOLUS
INTRAVENOUS | Status: DC | PRN
  Administered 2024-02-20: 90 mg via INTRAVENOUS
  Administered 2024-02-20: 10 mg via INTRAVENOUS

## 2024-02-20 SURGICAL SUPPLY — 96 items
APPLICATOR SURGIFLO ENDO (HEMOSTASIS) IMPLANT
BAG COUNTER SPONGE SURGICOUNT (BAG) IMPLANT
BAG LAPAROSCOPIC 12 15 PORT 16 (BASKET) IMPLANT
BAG RETRIEVAL 12/15 (BASKET) ×3 IMPLANT
BLADE SURG SZ10 CARB STEEL (BLADE) IMPLANT
CABLE HIGH FREQUENCY MONO STRZ (ELECTRODE) IMPLANT
CHLORAPREP W/TINT 26 (MISCELLANEOUS) ×3 IMPLANT
CNTNR URN SCR LID CUP LEK RST (MISCELLANEOUS) IMPLANT
COVER BACK TABLE 60X90IN (DRAPES) ×3 IMPLANT
COVER SURGICAL LIGHT HANDLE (MISCELLANEOUS) ×3 IMPLANT
COVER TIP SHEARS 8 DVNC (MISCELLANEOUS) ×3 IMPLANT
DERMABOND ADVANCED .7 DNX12 (GAUZE/BANDAGES/DRESSINGS) ×3 IMPLANT
DERMABOND ADVANCED .7 DNX6 (GAUZE/BANDAGES/DRESSINGS) ×1 IMPLANT
DRAPE ARM DVNC X/XI (DISPOSABLE) ×12 IMPLANT
DRAPE COLUMN DVNC XI (DISPOSABLE) ×3 IMPLANT
DRAPE SHEET LG 3/4 BI-LAMINATE (DRAPES) ×3 IMPLANT
DRAPE SURG IRRIG POUCH 19X23 (DRAPES) ×3 IMPLANT
DRIVER NDL MEGA SUTCUT DVNCXI (INSTRUMENTS) ×2 IMPLANT
DRIVER NDLE MEGA SUTCUT DVNCXI (INSTRUMENTS) ×3 IMPLANT
DRSG OPSITE POSTOP 4X6 (GAUZE/BANDAGES/DRESSINGS) IMPLANT
DRSG OPSITE POSTOP 4X8 (GAUZE/BANDAGES/DRESSINGS) IMPLANT
ELECT PENCIL ROCKER SW 15FT (MISCELLANEOUS) ×1 IMPLANT
ELECT REM PT RETURN 15FT ADLT (MISCELLANEOUS) ×3 IMPLANT
FORCEPS BPLR FENES DVNC XI (FORCEP) ×3 IMPLANT
FORCEPS PROGRASP DVNC XI (FORCEP) ×3 IMPLANT
GAUZE 4X4 16PLY ~~LOC~~+RFID DBL (SPONGE) ×3 IMPLANT
GLOVE BIO SURGEON STRL SZ 6.5 (GLOVE) ×3 IMPLANT
GLOVE BIOGEL PI IND STRL 6.5 (GLOVE) ×6 IMPLANT
GLOVE BIOGEL PI MICRO STRL 6 (GLOVE) ×12 IMPLANT
GOWN STRL REUS W/ TWL LRG LVL3 (GOWN DISPOSABLE) ×12 IMPLANT
GRASPER SUT TROCAR 14GX15 (MISCELLANEOUS) ×1 IMPLANT
HOLDER FOLEY CATH W/STRAP (MISCELLANEOUS) IMPLANT
IRRIG SUCT STRYKERFLOW 2 WTIP (MISCELLANEOUS) ×3 IMPLANT
IRRIGATION SUCT STRKRFLW 2 WTP (MISCELLANEOUS) ×2 IMPLANT
KIT BASIN OR (CUSTOM PROCEDURE TRAY) ×3 IMPLANT
KIT PROCEDURE DVNC SI (MISCELLANEOUS) IMPLANT
KIT SIGMOIDOSCOPE (SET/KITS/TRAYS/PACK) ×1 IMPLANT
KIT TURNOVER KIT A (KITS) IMPLANT
LIGASURE BLUNT TIP 5 LONG 44CM (ELECTROSURGICAL) IMPLANT
LIGASURE IMPACT 36 18CM CVD LR (INSTRUMENTS) ×1 IMPLANT
MANIPULATOR ADVINCU DEL 3.0 PL (MISCELLANEOUS) ×1 IMPLANT
MANIPULATOR ADVINCU DEL 3.5 PL (MISCELLANEOUS) ×1 IMPLANT
MANIPULATOR UTERINE 4.5 ZUMI (MISCELLANEOUS) IMPLANT
NDL HYPO 21X1.5 SAFETY (NEEDLE) ×2 IMPLANT
NDL INSUFFLATION 14GA 120MM (NEEDLE) IMPLANT
NDL SPNL 20GX3.5 QUINCKE YW (NEEDLE) IMPLANT
NEEDLE HYPO 21X1.5 SAFETY (NEEDLE) ×3 IMPLANT
NEEDLE INSUFFLATION 14GA 120MM (NEEDLE) IMPLANT
NEEDLE SPNL 20GX3.5 QUINCKE YW (NEEDLE) IMPLANT
OBTURATOR OPTICAL STND 8 DVNC (TROCAR) ×3 IMPLANT
OBTURATOR OPTICALSTD 8 DVNC (TROCAR) ×2 IMPLANT
PACK ROBOT GYN CUSTOM WL (TRAY / TRAY PROCEDURE) ×3 IMPLANT
PAD ARMBOARD 7.5X6 YLW CONV (MISCELLANEOUS) ×3 IMPLANT
PAD POSITIONING PINK XL (MISCELLANEOUS) ×3 IMPLANT
PORT ACCESS TROCAR AIRSEAL 12 (TROCAR) IMPLANT
RETRACTOR WND ALEXIS 18 MED (MISCELLANEOUS) IMPLANT
RTRCTR WOUND ALEXIS 18CM MED (MISCELLANEOUS) ×3 IMPLANT
SCISSORS LAP 5X35 DISP (ENDOMECHANICALS) IMPLANT
SCISSORS MNPLR CVD DVNC XI (INSTRUMENTS) ×3 IMPLANT
SCRUB CHG 4% DYNA-HEX 4OZ (MISCELLANEOUS) ×6 IMPLANT
SEAL UNIV 5-12 XI (MISCELLANEOUS) ×12 IMPLANT
SEALER TISSUE G2 CVD JAW 45CM (ENDOMECHANICALS) IMPLANT
SET TRI-LUMEN FLTR TB AIRSEAL (TUBING) ×3 IMPLANT
SHEET LAVH (DRAPES) ×3 IMPLANT
SLEEVE Z-THREAD 5X100MM (TROCAR) ×3 IMPLANT
SPIKE FLUID TRANSFER (MISCELLANEOUS) ×3 IMPLANT
SPONGE T-LAP 18X18 ~~LOC~~+RFID (SPONGE) ×1 IMPLANT
STAPLER PROXIMATE 75MM BLUE (STAPLE) ×1 IMPLANT
SURGIFLO W/THROMBIN 8M KIT (HEMOSTASIS) IMPLANT
SUT MNCRL AB 4-0 PS2 18 (SUTURE) ×6 IMPLANT
SUT PDS AB 1 TP1 54 (SUTURE) ×2 IMPLANT
SUT VIC AB 0 CT1 27XBRD ANTBC (SUTURE) IMPLANT
SUT VIC AB 2-0 CT1 TAPERPNT 27 (SUTURE) ×1 IMPLANT
SUT VIC AB 3-0 SH 18 (SUTURE) ×2 IMPLANT
SUT VIC AB 3-0 SH 27X BRD (SUTURE) ×1 IMPLANT
SUT VIC AB 4-0 PS2 18 (SUTURE) ×6 IMPLANT
SUT VICRYL 0 27 CT2 27 ABS (SUTURE) ×3 IMPLANT
SYR 10ML LL (SYRINGE) IMPLANT
SYS BAG RETRIEVAL 10MM (BASKET) IMPLANT
SYS RETRIEVAL 5MM INZII UNIV (BASKET) IMPLANT
SYS WOUND ALEXIS 18CM MED (MISCELLANEOUS) IMPLANT
SYSTEM BAG RETRIEVAL 10MM (BASKET) IMPLANT
SYSTEM RETRIEVL 5MM INZII UNIV (BASKET) IMPLANT
SYSTEM WOUND ALEXIS 18CM MED (MISCELLANEOUS) IMPLANT
TOWEL OR 17X26 10 PK STRL BLUE (TOWEL DISPOSABLE) ×3 IMPLANT
TRAP SPECIMEN MUCUS 40CC (MISCELLANEOUS) IMPLANT
TRAY FOLEY MTR SLVR 16FR STAT (SET/KITS/TRAYS/PACK) ×3 IMPLANT
TRAY LAPAROSCOPIC (CUSTOM PROCEDURE TRAY) ×3 IMPLANT
TROCAR ADV FIXATION 12X100MM (TROCAR) IMPLANT
TROCAR BALLN 12MMX100 BLUNT (TROCAR) IMPLANT
TROCAR PORT AIRSEAL 5X120 (TROCAR) IMPLANT
TROCAR XCEL NON-BLD 5MMX100MML (ENDOMECHANICALS) ×3 IMPLANT
TROCAR Z-THREAD OPTICAL 5X100M (TROCAR) ×3 IMPLANT
UNDERPAD 30X36 HEAVY ABSORB (UNDERPADS AND DIAPERS) ×6 IMPLANT
WATER STERILE IRR 1000ML POUR (IV SOLUTION) ×3 IMPLANT
YANKAUER SUCT BULB TIP 10FT TU (MISCELLANEOUS) ×1 IMPLANT

## 2024-02-20 NOTE — Op Note (Signed)
 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  On entry to abdomen, normal upper abdominal survey with smooth diaphragm, liver, stomach and normal appearing omentum and bowel.  Upon visualization into the pelvis, multiple loops of small bowel adherent to the right adnexal mass, initially occluding view of mass.  Following enterolysis, approximately 8 cm right ovarian mass identified to demonstrate torsion with necrosis, adherent to the right anterior cul-de-sac and bladder serosa.  Otherwise small normal-appearing uterus, left fallopian tube and ovary. Retroperitoneal fibrosis on the right, requiring right ureterolysis. Colon with diverticulosis.  Two approximately 1 cm areas on the sigmoid and rectum (at the peritoneal reflection) with evidence of treatment effect without overt active disease, both areas excised.  Appendix adherent to the necrotic pelvic mass with disruption and serosa on lysis of adhesions, removed.  Small bowel run with no evidence of mesenteric or serosal disease.  Few areas of deserosalization from enterolysis oversewn.  Omentum without overt active disease but few areas of scarring, likely to represent treated disease. Negative bubble study. R0 resection.   Specimens:  ID Type Source Tests Collected by Time Destination  1 : Distal Rectal Biopsy Tissue  PATH GI Other SURGICAL PATHOLOGY Clide Cliff, MD 02/20/2024 1715   2 : Sigmoid Colon Biopsy Tissue PATH GI Other SURGICAL PATHOLOGY Clide Cliff, MD 02/20/2024 1719   3 : Uterus, Cervix, Bilateral Fallopian Tubes and Ovaries Tissue PATH Gyn tumor resection SURGICAL PATHOLOGY Clide Cliff, MD 02/20/2024 1753   4 : Omentum Tissue PATH GI biopsy SURGICAL PATHOLOGY Clide Cliff, MD 02/20/2024 1802   5 : Appendix Tissue PATH Appendix SURGICAL PATHOLOGY Clide Cliff, MD 02/20/2024 1828     Complications:  None  Indications for Procedure: Lori Conrad is a 78 y.o. 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  She previously underwent diagnostic laparoscopy, peritoneal biopsies on 11/08/23 with Dr. Eugene Garnet at which time tumor involving the rectosigmoid colon was noted that would require resection, and so decision made to proceed with neoadjuvant chemotherapy.  She has completed 4 cycles of neoadjuvant chemotherapy and is ready to proceed with interval debulking surgery. Prior to the procedure, all risks, benefits, and alternatives were discussed and informed surgical consent was signed.  Procedure: Patient was taken to the operating room where general anesthesia was achieved.  She was positioned in dorsal lithotomy and prepped and draped.  A foley catheter was inserted into the bladder. The cervix was dilated.  A scar was noted at the posterior fourchette that limited introduction of the KOH ring of the manipulator.  Using scissors, the scar was incised superficially, and an Advincula uterine manipulator with a colpotomy ring was inserted into the uterus.  A 12 mm incision was made in the left upper quadrant near Palmer's point.  The abdomen was entered with a 5 mm OptiView trocar under direct visualization.  The abdomen was insufflated, the patient placed in steep Trendelenburg, and additional trocars were placed as follows: an 8mm trocar  superior to the  umbilicus, two 8 mm robotic trocars in the right abdomen, and one 8 mm robotic trocar in the left abdomen.  The left upper quadrant trocar was removed and replaced with a 12 mm airseal trocar.  All trocars were placed under direct visualization.  The bowels were moved into the upper abdomen.  The DaVinci robotic surgical system was brought to the patient's bedside and docked.  Findings in the pelvis were noted as documented above.  The right adnexal mass was noted to be the likely necrotic from a torsion.  Due to this inflammatory response, several loops of small bowel or adherent to the adnexal mass.  Enterolysis was performed using combination of sharp dissection with cold scissors and blunt dissection.  With the loops of small bowel mobilized off of the mass, the mass was then mobilized off of the pelvic sidewall and anterior cul-de-sac with additional blunt and sharp dissection.  The left round ligament was transected and the retroperitoneum entered.  The left ureter was identified. The left infundibulopelvic ligament was isolated, cauterized, and transected. The posterior peritoneum was opened to the KOH ring.  The anterior peritoneum was opened and the bladder flap was created.  The left uterine artery was skeletonized, cauterized, and transected at the level of the KOH ring. Additional cautery was used in a C-shaped fashion to allow the remainder of the broad, cardinal, and uterosacral ligaments with the uterine vessels to be transected and fall away from the KOH ring. A similar procedure was performed on the right side.  Due to dense retroperitoneal fibrosis, complete ureterolysis was performed on the right from the level of the pelvic brim to insertion into the bladder.  Additionally, the right ovary was noted to have remaining attachments to the appendix.  The appendix was dissected free from the mass sharply with scissors but was noted to be deserosalized in this process.  A  colpotomy was made circumferentially following the contours of the KOH ring.  The specimen was then placed in an Endo Catch bag and left in the upper abdomen for later retrieval.  At this time an EEA sizer was placed in the rectum.  With the posterior vagina elevated, the posterior peritoneum was incised and the rectovaginal space entered.  The space was developed bluntly.  In doing so this mobilized a nodule on the posterior peritoneum at the level of the peritoneal reflection.  This area was then resected sharply without disruption of the serosa of the rectum.  An additional area of nodularity was noted on the sigmoid colon near the epiploica.  This area was additionally excised sharply without disruption of the serosa.  Both of these specimens were removed.   The vaginal cuff was closed with a running stitch of 0 Vicryl suture.  The pelvis was irrigated and all operative sites were found to be hemostatic.  The patient was then taken out of steep Trendelenburg.  The pelvis was filled with irrigation and a bubble test performed.  This was negative.  The irrigation was suctioned out. The appendix was re-evaluated. Due to deserosalization that occurred from dissection from the right adnexal mass, decision made to proceed with appendectomy. Bipolar cautery and monopolar scissors was used to skeletonize and transect the appendiceal artery and to mobilize the nearby peritoneal attachments.  At this time, all instruments were removed and the robot was taken from the patient's bedside. The fascia at the 12 mm incision was closed with 0 Vicryl using a PMI device. The midline robotic trocar was removed.  The midline was incision was extended with a scalpel and the subcutaneous tissue divided sharply. The fascial incision was extended with electrocautery for a minilaparotomy. An alexis retractor was placed. Abdomen was desufflated and the remaining trocars were removed. The omentum was brought up through the incision.  An area superior to the transverse colon was incised and the lesser sac was entered.  Careful dissection was done to remove the omentum from the length of the transverse colon with electrocautery. Pedicles were made in the omentum superior to the transverse colon but caudad to the short gastrics. These were cauterized and transected with Ligasure cautery. All scarred areas were removed. Hemostasis was noted at all pedicles.   The small bowel was then run in its entirety. Areas of deserosalization from prior enterolysis were oversewn in a lembert fashion with 3-0 vicryl interrupted sutures. The appendix was then grasped with a babcock. The base of the appendix was noted to be sufficiently isolated. The base was then transected with a GIA stapler and the appendix handed off the field. This area was irrigated. The abdomen was irrigated and hemostasis was noted.  The fascia of the midline incision was then closed in a running fashion with #1 PDS. The subcutaneous tissue was irrigated and made hemostatic with electrocautery. Exparel was injected in the standard fashion. The subcutaneous layer was reapproximated with 2-0 vicryl in a running fashion. The skin was closed with 4-0 vicryl deep dermal running followed by 4-0 monocryl in a subcuticular fashion and surgical glue.  The skin at all laparoscopic incisions was closed with 4-0 Vicryl to reapproximate the subcutaneous tissue and 4-0 monocryl in a subcuticular fashion followed by surgical glue. Several incisions required a mattress stitch with 4-0 monocryl for hemostasis.   The vagina was then inspected and noted to have small bleeding from area of incision made at the beginning of the procedure. This was closed with 3-0 vicryl in a running fashion. Hemostasis was achieved.   Patient tolerated the procedure well. Sponge, lap, and instrument counts were correct.  Patient received 2 gm of Ancef and 500mg  metronidazole prior to skin incision for routine  perioperative antibiotic prophylaxis.  She was extubated and taken to the PACU in stable condition.  Clide Cliff, MD Gynecologic Oncology

## 2024-02-20 NOTE — Brief Op Note (Signed)
 02/20/2024  7:26 PM  PATIENT:  Lori Conrad  78 y.o. female  PRE-OPERATIVE DIAGNOSIS:  OVARIAN CANCER  POST-OPERATIVE DIAGNOSIS:  OVARIAN CANCER  PROCEDURE:  Procedure(s): XI ROBOTIC ASSISTED TOTAL HYSTERECTOMY WITH BILATERAL SALPINGO OOPHORECTOMY, OMENTECTOMY, DEBULKING, ENTEROLYSIS (Bilateral) APPENDECTOMY  SURGEON:  Surgeons and Role:    Clide Cliff, MD - Primary ASSISTANTS: Warner Mccreedy, NP   ANESTHESIA:   general  EBL:  50 mL   BLOOD ADMINISTERED:none  DRAINS: none   LOCAL MEDICATIONS USED:  BUPIVICAINE  and OTHER EXPAREL  SPECIMEN:   ID Type Source Tests Collected by Time Destination  1 : Distal Rectal Biopsy Tissue PATH GI Other SURGICAL PATHOLOGY Clide Cliff, MD 02/20/2024 1715   2 : Sigmoid Colon Biopsy Tissue PATH GI Other SURGICAL PATHOLOGY Clide Cliff, MD 02/20/2024 1719   3 : Uterus, Cervix, Bilateral Fallopian Tubes and Ovaries Tissue PATH Gyn tumor resection SURGICAL PATHOLOGY Clide Cliff, MD 02/20/2024 1753   4 : Omentum Tissue PATH GI biopsy SURGICAL PATHOLOGY Clide Cliff, MD 02/20/2024 1802   5 : Appendix Tissue PATH Appendix SURGICAL PATHOLOGY Clide Cliff, MD 02/20/2024 1828     DISPOSITION OF SPECIMEN:  PATHOLOGY  COUNTS:  YES  TOURNIQUET:  * No tourniquets in log *  DICTATION: .Note written in EPIC  PLAN OF CARE: Admit to inpatient   PATIENT DISPOSITION:  PACU - hemodynamically stable.   Delay start of Pharmacological VTE agent (>24hrs) due to surgical blood loss or risk of bleeding: no

## 2024-02-20 NOTE — Transfer of Care (Signed)
 Immediate Anesthesia Transfer of Care Note  Patient: Lori Conrad  Procedure(s) Performed: XI ROBOTIC ASSISTED TOTAL HYSTERECTOMY WITH BILATERAL SALPINGO OOPHORECTOMY, OMENTECTOMY, DEBULKING, ENTEROLYSIS (Bilateral) APPENDECTOMY  Patient Location: PACU  Anesthesia Type:General  Level of Consciousness: awake and patient cooperative  Airway & Oxygen Therapy: Patient Spontanous Breathing and Patient connected to face mask  Post-op Assessment: Report given to RN and Post -op Vital signs reviewed and stable  Post vital signs: Reviewed and stable  Last Vitals:  Vitals Value Taken Time  BP 155/86 02/20/24 1921  Temp    Pulse 74 02/20/24 1923  Resp 16 02/20/24 1923  SpO2 99 % 02/20/24 1923  Vitals shown include unfiled device data.  Last Pain:  Vitals:   02/20/24 1121  TempSrc:   PainSc: 0-No pain         Complications: No notable events documented.

## 2024-02-20 NOTE — Anesthesia Procedure Notes (Signed)
 Procedure Name: Intubation Date/Time: 02/20/2024 3:03 PM  Performed by: Maurene Capes, CRNAPre-anesthesia Checklist: Patient identified, Emergency Drugs available, Suction available and Patient being monitored Patient Re-evaluated:Patient Re-evaluated prior to induction Oxygen Delivery Method: Circle System Utilized Preoxygenation: Pre-oxygenation with 100% oxygen Induction Type: IV induction Ventilation: Mask ventilation without difficulty Laryngoscope Size: Mac and 3 Grade View: Grade II Tube type: Oral Tube size: 7.0 mm Number of attempts: 1 Placement Confirmation: ETT inserted through vocal cords under direct vision, positive ETCO2 and breath sounds checked- equal and bilateral Secured at: 20 cm Tube secured with: Tape Dental Injury: Teeth and Oropharynx as per pre-operative assessment

## 2024-02-20 NOTE — Interval H&P Note (Signed)
 History and Physical Interval Note:  02/20/2024 2:04 PM  Lori Conrad  has presented today for surgery, with the diagnosis of OVARIAN CANCER.  The various methods of treatment have been discussed with the patient and family. After consideration of risks, benefits and other options for treatment, the patient has consented to  Procedure(s): LAPAROSCOPY, DIAGNOSTIC (N/A) XI ROBOTIC ASSISTED TOTAL HYSTERECTOMY WITH BILATERAL SALPINGO OOPHORECTOMY VS. OPEN (Bilateral) OMENTECTOMY (N/A) DEBULKING, ABDOMINAL (N/A) POSSIBLE BOWEL SURGERY (N/A) POSSIBLE OSTOMY (N/A) as a surgical intervention.  The patient's history has been reviewed, patient examined, no change in status, stable for surgery.  I have reviewed the patient's chart and labs.  Questions were answered to the patient's satisfaction.     [name]

## 2024-02-20 NOTE — Consult Note (Signed)
 WOC team received consult for preoperative ostomy marking.  This patient was marked for ostomy 02/15/2024, see that note.    WOC team will follow postop for ostomy needs.   Thank you,     Priscella Mann MSN, RN-BC, [employer] [phone]

## 2024-02-21 ENCOUNTER — Other Ambulatory Visit: Payer: Self-pay

## 2024-02-21 ENCOUNTER — Encounter (HOSPITAL_COMMUNITY): Payer: Self-pay | Admitting: Psychiatry

## 2024-02-21 LAB — BASIC METABOLIC PANEL
Anion gap: 10 (ref 5–15)
BUN: 11 mg/dL (ref 8–23)
CO2: 27 mmol/L (ref 22–32)
Calcium: 8.7 mg/dL — ABNORMAL LOW (ref 8.9–10.3)
Chloride: 101 mmol/L (ref 98–111)
Creatinine, Ser: 0.52 mg/dL (ref 0.44–1.00)
GFR, Estimated: >60 mL/min (ref >60)
Glucose, Bld: 168 mg/dL — ABNORMAL HIGH (ref 70–99)
Potassium: 4 mmol/L (ref 3.5–5.1)
Sodium: 138 mmol/L (ref 135–145)

## 2024-02-21 LAB — CBC
HCT: 37.2 % (ref 36.0–46.0)
HCT: 38.4 % (ref 36.0–46.0)
Hemoglobin: 11.7 g/dL — ABNORMAL LOW (ref 12.0–15.0)
Hemoglobin: 11.9 g/dL — ABNORMAL LOW (ref 12.0–15.0)
MCH: 29.3 pg (ref 26.0–34.0)
MCH: 29.7 pg (ref 26.0–34.0)
MCHC: 31.5 g/dL (ref 30.0–36.0)
MCHC: 31 g/dL (ref 30.0–36.0)
MCV: 93 fL (ref 80.0–100.0)
MCV: 95.8 fL (ref 80.0–100.0)
Platelets: 160 10*3/uL (ref 150–400)
Platelets: 154 10*3/uL (ref 150–400)
RBC: 4 MIL/uL (ref 3.87–5.11)
RBC: 4.01 MIL/uL (ref 3.87–5.11)
RDW: 15.9 % — ABNORMAL HIGH (ref 11.5–15.5)
RDW: 16 % — ABNORMAL HIGH (ref 11.5–15.5)
WBC: 15 10*3/uL — ABNORMAL HIGH (ref 4.0–10.5)
WBC: 13.1 10*3/uL — ABNORMAL HIGH (ref 4.0–10.5)
nRBC: 0 % (ref 0.0–0.2)
nRBC: 0 % (ref 0.0–0.2)

## 2024-02-21 NOTE — TOC Initial Note (Addendum)
 Transition of Care [facility]) - Initial/Assessment Note    Patient Details  Name: Lori Conrad MRN: [id number] Date of Birth: 02-17-1946  Transition of Care [facility]) CM/SW Contact:    Howell Rucks, RN Phone Number: 02/21/2024, 1:22 PM  Clinical Narrative:   Call to [facility], sw [place], M. Adebukola , confirmed pt is ALF, will need an updated FL2 for return,  [place] to email NCM documentation required on FL2.   -2:50om Email with example FL2 received. NCM will complete FL2 on day of discharge to include dc medications and email to [facility]. TOC will continue to follow.                     Patient Goals and CMS Choice            Expected Discharge Plan and Services                                              Prior Living Arrangements/Services                       Activities of Daily Living   ADL Screening (condition at time of admission) Independently performs ADLs?: Yes (appropriate for developmental age) Is the patient deaf or have difficulty hearing?: No Does the patient have difficulty seeing, even when wearing glasses/contacts?: Yes Does the patient have difficulty concentrating, remembering, or making decisions?: No  Permission Sought/Granted                  Emotional Assessment              Admission diagnosis:  Ovarian cancer [facility]) [C56.9] Patient Active Problem List   Diagnosis Date Noted   Ovarian cancer (HCC) 02/20/2024   Pleural mass 01/25/2024   Leukopenia due to antineoplastic chemotherapy (HCC) 01/04/2024   Genetic testing 12/28/2023   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   Mild anxiety 12/14/2023   Other fatigue 12/14/2023   Malignant ovarian neoplasm, right (HCC) 11/14/2023   Other constipation 11/14/2023   Ovarian torsion 11/08/2023   Pelvic mass 10/31/2023   Abdominal pain 10/30/2023   Abdominal mass 10/30/2023   Leukocytosis 10/30/2023   Hypothyroidism 10/30/2023   AAA (abdominal aortic aneurysm) (HCC)  10/30/2023   Uterine mass 10/30/2023   Mixed hyperlipidemia 10/12/2022   Essential hypertension 10/12/2022   Changing skin lesion 10/07/2022   Encounter for counseling 02/01/2019   Elevated coronary artery calcium score 05/19/2014   Dyslipidemia 05/19/2014   DOE (dyspnea on exertion) 05/19/2014   PCP:  Artis Delay, MD Pharmacy:   [facility] [place], [place] - [address] [place] [address] Phone: [phone] Fax: [phone]     Social Drivers of Health (SDOH) Social History: SDOH Screenings   Food Insecurity: No Food Insecurity (02/21/2024)  Housing: Low Risk  (02/21/2024)  Transportation Needs: Unknown (02/21/2024)  Utilities: Not At Risk (02/21/2024)  Social Connections: Socially Isolated (02/21/2024)  Tobacco Use: Medium Risk (02/20/2024)   SDOH Interventions:     Readmission Risk Interventions     No data to display

## 2024-02-21 NOTE — Progress Notes (Signed)
 1 Day Post-Op Procedure(s) (LRB): XI ROBOTIC ASSISTED TOTAL HYSTERECTOMY WITH BILATERAL SALPINGO OOPHORECTOMY, OMENTECTOMY, DEBULKING, ENTEROLYSIS (Bilateral) APPENDECTOMY  Subjective: Patient reports "feeling fine" this am. Slept well. Minimal to no abdominal pain this am. Voiding without difficulty. Ambulating without difficulty. No flatus reported. Denies nausea or emesis. Denies chest pain, dyspnea, lightheadedness. Currently ordering breakfast. No needs or concerns voiced at this time.   Objective: Vital signs in last 24 hours: Temp:  [97.6 F (36.4 C)-98.2 F (36.8 C)] 98.2 F (36.8 C) (03/12 1610) Pulse Rate:  [67-82] 77 (03/12 0632) Resp:  [11-19] 17 (03/12 9604) BP: (126-180)/(77-107) 126/78 (03/12 5409) SpO2:  [93 %-100 %] 95 % (03/12 8119) Weight:  [109 lb (49.4 kg)] 109 lb (49.4 kg) (03/11 1121)    Intake/Output from previous day: 03/11 0701 - 03/12 0700 In: 2244.8 [P.O.:240; I.V.:1204.8; IV Piggyback:700] Out: 2100 [Urine:2050; Blood:50]  Physical Examination: General: alert, cooperative, and no distress Resp: clear to auscultation bilaterally Cardio: regular rate and rhythm, S1, S2 normal, no murmur, click, rub or gallop GI: soft, non-tender; bowel sounds normal; no masses,  no organomegaly and incision: lap sites to the abdomen with dermabond intact with no drainage, mini laparotomy incision in midline abdomen intact with no drainage with dermabond present. Extremities: extremities normal, atraumatic, no cyanosis or edema  Labs: WBC/Hgb/Hct/Plts:  15.0/11.7/37.2/160 (03/12 0518) BUN/Cr/glu/ALT/AST/amyl/lip:  11/0.52/--/--/--/--/-- (03/12 0518)  Assessment: 78 y.o. s/p Procedure(s): XI ROBOTIC ASSISTED TOTAL HYSTERECTOMY WITH BILATERAL SALPINGO OOPHORECTOMY, OMENTECTOMY, DEBULKING, ENTEROLYSIS, APPENDECTOMY: stable Pain:  Pain is well-controlled on PRN medications.  Heme: Hgb 11.7 and Hct 37.2 this am. Plan for repeat CBC later this am to assess for stability.  Pre-op CBC most likely concentrated due to patient having bowel prep preop.  ID: WBC 15.0-felt to be reactive. Given decadron and intra-op ancef and flagyl.  CV: BP and HR stable. Continue to monitor while inpt.  GI:  Tolerating po: Yes. Antiemetics ordered PRN. Needs to pass flatus at minimum per Dr. Alvester Morin prior to discharge.  GU: Creatinine 0.52 this am. 600 cc of urine documented overnight.    FEN: No critical values on am labs.  Prophylaxis: SCDs and lovenox ordered-subject to change based on repeat CBC results.   Plan: Repeat CBC later this am IV to saline lock if diet tolerated Continue with current plan of care Plan to discharge back to assisted living with patient is ready and meeting milestones   LOS: 1 day    Lori Conrad 02/21/2024, 7:38 AM

## 2024-02-21 NOTE — Anesthesia Postprocedure Evaluation (Signed)
 Anesthesia Post Note  Patient: Providence Crosby Guzy  Procedure(s) Performed: XI ROBOTIC ASSISTED TOTAL HYSTERECTOMY WITH BILATERAL SALPINGO OOPHORECTOMY, OMENTECTOMY, DEBULKING, ENTEROLYSIS (Bilateral) APPENDECTOMY     Patient location during evaluation: PACU Anesthesia Type: General Level of consciousness: awake and alert Pain management: pain level controlled Vital Signs Assessment: post-procedure vital signs reviewed and stable Respiratory status: spontaneous breathing, nonlabored ventilation, respiratory function stable and patient connected to nasal cannula oxygen Cardiovascular status: blood pressure returned to baseline and stable Postop Assessment: no apparent nausea or vomiting Anesthetic complications: no   No notable events documented.  Last Vitals:  Vitals:   02/21/24 0100 02/21/24 0632  BP: 126/77 126/78  Pulse: 78 77  Resp: 16 17  Temp: 36.7 C 36.8 C  SpO2: 93% 95%    Last Pain:  Vitals:   02/21/24 0652  TempSrc:   PainSc: 3                  Beryle Lathe

## 2024-02-21 NOTE — Plan of Care (Signed)
  Problem: Education: Goal: Knowledge of General Education information will improve Description: Including pain rating scale, medication(s)/side effects and non-pharmacologic comfort measures 02/21/2024 1944 by Josph Macho, RN Outcome: Progressing 02/21/2024 1917 by Josph Macho, RN Outcome: Progressing   Problem: Health Behavior/Discharge Planning: Goal: Ability to manage health-related needs will improve 02/21/2024 1944 by Josph Macho, RN Outcome: Progressing 02/21/2024 1917 by Josph Macho, RN Outcome: Progressing   Problem: Activity: Goal: Risk for activity intolerance will decrease 02/21/2024 1944 by Josph Macho, RN Outcome: Progressing 02/21/2024 1917 by Josph Macho, RN Outcome: Progressing   Problem: Nutrition: Goal: Adequate nutrition will be maintained 02/21/2024 1944 by Josph Macho, RN Outcome: Progressing 02/21/2024 1917 by Josph Macho, RN Outcome: Progressing

## 2024-02-21 NOTE — Plan of Care (Signed)
   Problem: Activity: Goal: Risk for activity intolerance will decrease Outcome: Progressing   Problem: Nutrition: Goal: Adequate nutrition will be maintained Outcome: Progressing   Problem: Safety: Goal: Ability to remain free from injury will improve Outcome: Progressing

## 2024-02-21 NOTE — Consult Note (Signed)
 Verified by OP note and bedside RN that patient did not receive an ostomy during surgical procedure 02/20/2024.  WOC team will sign off.   Please reconsult if needs arise.   Thank you,    Priscella Mann MSN, RN-BC, [employer] [phone]

## 2024-02-22 DIAGNOSIS — C561 Malignant neoplasm of right ovary: Secondary | ICD-10-CM | POA: Diagnosis not present

## 2024-02-22 LAB — BASIC METABOLIC PANEL
Anion gap: 7 (ref 5–15)
BUN: 9 mg/dL (ref 8–23)
CO2: 26 mmol/L (ref 22–32)
Calcium: 8.6 mg/dL — ABNORMAL LOW (ref 8.9–10.3)
Chloride: 105 mmol/L (ref 98–111)
Creatinine, Ser: 0.48 mg/dL (ref 0.44–1.00)
GFR, Estimated: >60 mL/min (ref >60)
Glucose, Bld: 86 mg/dL (ref 70–99)
Potassium: 3.4 mmol/L — ABNORMAL LOW (ref 3.5–5.1)
Sodium: 138 mmol/L (ref 135–145)

## 2024-02-22 LAB — CBC
HCT: 38 % (ref 36.0–46.0)
Hemoglobin: 11.8 g/dL — ABNORMAL LOW (ref 12.0–15.0)
MCH: 29.4 pg (ref 26.0–34.0)
MCHC: 31.1 g/dL (ref 30.0–36.0)
MCV: 94.8 fL (ref 80.0–100.0)
Platelets: 160 10*3/uL (ref 150–400)
RBC: 4.01 MIL/uL (ref 3.87–5.11)
RDW: 16.3 % — ABNORMAL HIGH (ref 11.5–15.5)
WBC: 5.9 10*3/uL (ref 4.0–10.5)
nRBC: 0 % (ref 0.0–0.2)

## 2024-02-22 MED ORDER — POTASSIUM CHLORIDE CRYS ER 20 MEQ PO TBCR
40.0000 meq | EXTENDED_RELEASE_TABLET | Freq: Once | ORAL | Status: AC
  Administered 2024-02-22: 40 meq via ORAL
  Filled 2024-02-22: qty 2

## 2024-02-22 MED ORDER — APIXABAN 2.5 MG PO TABS: 2.5000 mg | ORAL_TABLET | Freq: Two times a day (BID) | ORAL | 0 refills | Status: DC

## 2024-02-22 NOTE — NC FL2 (Signed)
 [place] MEDICAID FL2 LEVEL OF CARE FORM     IDENTIFICATION  Patient Name: Lori Conrad Birthdate: [birth year] Sex: female Admission Date (Current Location): 02/20/2024  [facility] and [place] Number:  Producer, television/film/video and Address:         Provider Number: [id number]  Attending Physician Name and Address:  Clide Cliff, MD  Relative Name and Phone Number:  Lori Conrad, Lori Conrad (Son)  [phone] (Mobile)    Current Level of Care: Hospital Recommended Level of Care: Assisted Living Facility Prior Approval Number:    Date Approved/Denied:   PASRR Number:    Discharge Plan: Other (Comment) ([facility])    Current Diagnoses: Patient Active Problem List   Diagnosis Date Noted   Ovarian cancer (HCC) 02/20/2024   Pleural mass 01/25/2024   Leukopenia due to antineoplastic chemotherapy (HCC) 01/04/2024   Genetic testing 12/28/2023   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   Mild anxiety 12/14/2023   Other fatigue 12/14/2023   Malignant ovarian neoplasm, right (HCC) 11/14/2023   Other constipation 11/14/2023   Ovarian torsion 11/08/2023   Pelvic mass 10/31/2023   Abdominal pain 10/30/2023   Abdominal mass 10/30/2023   Leukocytosis 10/30/2023   Hypothyroidism 10/30/2023   AAA (abdominal aortic aneurysm) (HCC) 10/30/2023   Uterine mass 10/30/2023   Mixed hyperlipidemia 10/12/2022   Essential hypertension 10/12/2022   Changing skin lesion 10/07/2022   Encounter for counseling 02/01/2019   Elevated coronary artery calcium score 05/19/2014   Dyslipidemia 05/19/2014   DOE (dyspnea on exertion) 05/19/2014    Orientation RESPIRATION BLADDER Height & Weight     Self, Situation, Time, Place  Normal Continent Weight: 49.4 kg Height:  5\' 1"  (154.9 cm)  BEHAVIORAL SYMPTOMS/MOOD NEUROLOGICAL BOWEL NUTRITION STATUS      Continent Diet  AMBULATORY STATUS COMMUNICATION OF NEEDS Skin   Limited Assist Verbally Normal                       Personal Care Assistance Level of  Assistance  Bathing, Feeding, Dressing Bathing Assistance: Limited assistance Feeding assistance: Limited assistance Dressing Assistance: Limited assistance     Functional Limitations Info  Sight, Hearing, Speech Sight Info: Adequate Hearing Info: Adequate Speech Info: Adequate    SPECIAL CARE FACTORS FREQUENCY                       Contractures Contractures Info: Not present    Additional Factors Info  Code Status, Allergies, Psychotropic Code Status Info: Full Code Allergies Info: NKA Psychotropic Info: N/A          Discharge Medications: apixaban 2.5 MG Tabs tablet Commonly known as: Eliquis Take 1 tablet (2.5 mg total) by mouth 2 (two) times daily. For AFTER surgery only, to begin taking the evening of Friday March 14. Start taking on: February 23, 2024    atorvastatin 40 MG tablet Commonly known as: LIPITOR Take 40 mg by mouth at bedtime.    dexamethasone 4 MG tablet Commonly known as: DECADRON Take 2 tabs at the night before and 2 tab the morning of chemotherapy, every 3 weeks, by mouth x 6 cycles    doxylamine (Sleep) 25 MG tablet Commonly known as: UNISOM Take 25 mg by mouth at bedtime as needed for sleep.    HYDROcodone-acetaminophen 5-325 MG tablet Commonly known as: NORCO/VICODIN Take 1-2 tablets by mouth every 4 (four) hours as needed for moderate pain (pain score 4-6) or severe pain (pain score 7-10).    lactulose 10 GM/15ML  solution Commonly known as: CHRONULAC Take 10.5 mLs (7 g total) by mouth 3 (three) times daily as needed for mild constipation.    levothyroxine 75 MCG tablet Commonly known as: SYNTHROID Take 75 mcg by mouth daily before breakfast.    lidocaine-prilocaine cream Commonly known as: EMLA Apply to affected area once daily prn What changed:  how much to take how to take this when to take this additional instructions    Nutritional Shake Liqd Take 1 Can by mouth 3 (three) times daily as needed (lack of appetite).     Ensure Take 1 Can by mouth 3 (three) times daily as needed (lack of appetite).    ondansetron 8 MG tablet Commonly known as: Zofran Take 1 tablet (8 mg total) by mouth every 8 (eight) hours as needed for nausea or vomiting. Start on the third day after carboplatin.    polyethylene glycol 17 g packet Commonly known as: MIRALAX / GLYCOLAX Take 17 g by mouth 2 (two) times daily as needed for mild constipation. What changed:  when to take this additional instructions    prochlorperazine 10 MG tablet Commonly known as: COMPAZINE Take 1 tablet (10 mg total) by mouth every 6 (six) hours as needed for nausea or vomiting.    senna-docusate 8.6-50 MG tablet Commonly known as: Senokot-S Take 2 tablets by mouth 2 (two) times daily as needed for mild constipation. For 3 days after chemotherapy and then prn as needed.    Vitamin D 50 MCG (2000 UT) tablet Take 2,000 Units by mouth daily.    Relevant Imaging Results:  Relevant Lab Results:   Additional Information SSN: [id number]  Howell Rucks, RN

## 2024-02-22 NOTE — Discharge Summary (Signed)
 Physician Discharge Summary  Patient ID: Lori Conrad MRN: [id number] DOB/AGE: [birth year] 78 y.o.  Admit date: 02/20/2024 Discharge date: 02/22/2024  Admission Diagnoses: Ovarian cancer [facility])  Discharge Diagnoses:  Principal Problem:   Ovarian cancer [facility])   Discharged Condition:  The patient is in good condition and stable for discharge.    Hospital Course: On 02/20/2024, the patient underwent the following: Procedure(s): XI ROBOTIC ASSISTED TOTAL HYSTERECTOMY WITH BILATERAL SALPINGO OOPHORECTOMY, OMENTECTOMY, DEBULKING, ENTEROLYSIS APPENDECTOMY. The postoperative course was uneventful.  She was discharged to home on postoperative day 2 tolerating a regular diet, ambulating, passing flatus, voiding, pain controlled with oral medications. She is discharged back to [facility] with plans for Eliquis for a total of 12 days post-op for DVT prophylaxis.    Consults: None  Significant Diagnostic Studies: Labs  Treatments: Surgery: see above  Discharge Exam (am assessment): Blood pressure (!) 144/87, pulse 71, temperature 98.2 F (36.8 C), temperature source Oral, resp. rate 18, height 5\' 1"  (1.549 m), weight 109 lb (49.4 kg), SpO2 94%. General: alert, cooperative, and no distress Resp: clear to auscultation bilaterally Cardio: regular rate and rhythm, S1, S2 normal, no murmur, click, rub or gallop GI: soft, non-tender; bowel sounds normal; no masses,  no organomegaly and incision: lap sites to the abdomen with dermabond intact with no drainage, mini laparotomy incision in midline abdomen intact with no drainage with dermabond present. Mildly tympanic on percussion.  Extremities: extremities normal, atraumatic, no cyanosis or edema  Disposition: Discharge disposition: 63-Long Term Care       Discharge Instructions     Call MD for:  difficulty breathing, headache or visual disturbances   Complete by: As directed    Call MD for:  extreme fatigue   Complete by: As directed     Call MD for:  hives   Complete by: As directed    Call MD for:  persistant dizziness or light-headedness   Complete by: As directed    Call MD for:  persistant nausea and vomiting   Complete by: As directed    Call MD for:  redness, tenderness, or signs of infection (pain, swelling, redness, odor or green/yellow discharge around incision site)   Complete by: As directed    Call MD for:  severe uncontrolled pain   Complete by: As directed    Call MD for:  temperature >100.4   Complete by: As directed    Diet - low sodium heart healthy   Complete by: As directed    Driving Restrictions   Complete by: As directed    No driving for 1 week(s) if you were cleared to drive before surgery.  Do not take narcotics and drive. You need to make sure your reaction time has returned.   Increase activity slowly   Complete by: As directed    Lifting restrictions   Complete by: As directed    No lifting greater than 10 lbs, pushing, pulling, straining for 6 weeks.   Sexual Activity Restrictions   Complete by: As directed    No sexual activity, nothing in the vagina, for 10-12 weeks.      Allergies as of 02/22/2024   No Known Allergies      Medication List     STOP taking these medications    bisacodyl 5 MG EC tablet Commonly known as: DULCOLAX   meloxicam 7.5 MG tablet Commonly known as: MOBIC   metroNIDAZOLE 500 MG tablet Commonly known as: FLAGYL   potassium chloride SA 20 MEQ tablet  Commonly known as: KLOR-CON M       TAKE these medications    apixaban 2.5 MG Tabs tablet Commonly known as: Eliquis Take 1 tablet (2.5 mg total) by mouth 2 (two) times daily. For AFTER surgery only, to begin taking the evening of Friday March 14. Start taking on: February 23, 2024   atorvastatin 40 MG tablet Commonly known as: LIPITOR Take 40 mg by mouth at bedtime.   dexamethasone 4 MG tablet Commonly known as: DECADRON Take 2 tabs at the night before and 2 tab the morning of chemotherapy,  every 3 weeks, by mouth x 6 cycles   doxylamine (Sleep) 25 MG tablet Commonly known as: UNISOM Take 25 mg by mouth at bedtime as needed for sleep.   HYDROcodone-acetaminophen 5-325 MG tablet Commonly known as: NORCO/VICODIN Take 1-2 tablets by mouth every 4 (four) hours as needed for moderate pain (pain score 4-6) or severe pain (pain score 7-10).   lactulose 10 GM/15ML solution Commonly known as: CHRONULAC Take 10.5 mLs (7 g total) by mouth 3 (three) times daily as needed for mild constipation.   levothyroxine 75 MCG tablet Commonly known as: SYNTHROID Take 75 mcg by mouth daily before breakfast.   lidocaine-prilocaine cream Commonly known as: EMLA Apply to affected area once daily prn What changed:  how much to take how to take this when to take this additional instructions   Nutritional Shake Liqd Take 1 Can by mouth 3 (three) times daily as needed (lack of appetite).   Ensure Take 1 Can by mouth 3 (three) times daily as needed (lack of appetite).   ondansetron 8 MG tablet Commonly known as: Zofran Take 1 tablet (8 mg total) by mouth every 8 (eight) hours as needed for nausea or vomiting. Start on the third day after carboplatin.   polyethylene glycol 17 g packet Commonly known as: MIRALAX / GLYCOLAX Take 17 g by mouth 2 (two) times daily as needed for mild constipation. What changed:  when to take this additional instructions   prochlorperazine 10 MG tablet Commonly known as: COMPAZINE Take 1 tablet (10 mg total) by mouth every 6 (six) hours as needed for nausea or vomiting.   senna-docusate 8.6-50 MG tablet Commonly known as: Senokot-S Take 2 tablets by mouth 2 (two) times daily as needed for mild constipation. For 3 days after chemotherapy and then prn as needed.   Vitamin D 50 MCG (2000 UT) tablet Take 2,000 Units by mouth daily.        Follow-up Information     Clide Cliff, MD Follow up on 03/11/2024.   Specialty: Gynecologic Oncology Why: at  2:15 pm at the [facility] information: [address] [place] [address] [phone]                 Greater than thirty minutes were spend for face to face discharge instructions and discharge orders/summary in EPIC.   Signed: Doylene Bode 02/22/2024, 12:00 PM

## 2024-02-22 NOTE — Care Management Important Message (Signed)
 Important Message  Patient Details IM Letter given. Name: Lori Conrad MRN: [id number] Date of Birth: May 26, 1946   Important Message Given:  Yes - Medicare IM     Caren Macadam 02/22/2024, 12:39 PM

## 2024-02-22 NOTE — Progress Notes (Signed)
 Pt verbalized understating of dc instructions. Discharged via wc to front entrance to meet son who is transporting her back to [facility] assisted living facility. Accompanied by NT.

## 2024-02-22 NOTE — Plan of Care (Signed)

## 2024-02-22 NOTE — Progress Notes (Signed)
 2 Days Post-Op Procedure(s) (LRB): XI ROBOTIC ASSISTED TOTAL HYSTERECTOMY WITH BILATERAL SALPINGO OOPHORECTOMY, OMENTECTOMY, DEBULKING, ENTEROLYSIS (Bilateral) APPENDECTOMY  Subjective: Patient reports doing well this am. Continuing to tolerate diet. No nausea or emesis reported. Ambulating without difficulty. Passing flatus. No BM. Pain minimal to none. Slept well last pm. Denies chest pain, dyspnea, lightheadedness. No needs or concerns voiced at this time.   Objective: Vital signs in last 24 hours: Temp:  [97.8 F (36.6 C)-98.2 F (36.8 C)] 98.2 F (36.8 C) (03/13 0607) Pulse Rate:  [71-82] 71 (03/13 0607) Resp:  [14-18] 18 (03/13 0607) BP: (124-149)/(77-89) 144/87 (03/13 0607) SpO2:  [94 %-100 %] 94 % (03/13 0607)    Intake/Output from previous day: 03/12 0701 - 03/13 0700 In: 1777.4 [P.O.:1320; I.V.:457.4] Out: 800 [Urine:800]  Physical Examination: General: alert, cooperative, and no distress Resp: clear to auscultation bilaterally Cardio: regular rate and rhythm, S1, S2 normal, no murmur, click, rub or gallop GI: soft, non-tender; bowel sounds normal; no masses,  no organomegaly and incision: lap sites to the abdomen with dermabond intact with no drainage, mini laparotomy incision in midline abdomen intact with no drainage with dermabond present. Extremities: extremities normal, atraumatic, no cyanosis or edema  Labs: WBC/Hgb/Hct/Plts:  5.9/11.8/38.0/160 (03/13 0526) BUN/Cr/glu/ALT/AST/amyl/lip:  9/0.48/--/--/--/--/-- (03/13 0526)  Assessment: 78 y.o. s/p Procedure(s): XI ROBOTIC ASSISTED TOTAL HYSTERECTOMY WITH BILATERAL SALPINGO OOPHORECTOMY, OMENTECTOMY, DEBULKING, ENTEROLYSIS, APPENDECTOMY: stable Pain:  Pain is well-controlled on PRN medications.  Heme: Hgb 11.8 and Hct 38 this am- overall stable.   ID: WBC 5.9. Given decadron and intra-op ancef and flagyl.  CV: BP and HR stable. Continue to monitor while inpt.  GI:  Tolerating po: Yes. Antiemetics ordered  PRN. Needs to pass flatus at minimum per Dr. Alvester Morin prior to discharge.  GU: Creatinine 0.48 this am. Adequate urine output.    FEN: No critical values on am labs.  Prophylaxis: SCDs and lovenox  Plan: Doing well. Meeting milestones. If continues to do well and pass more flatus, plan for discharge this afternoon back to assisted living   LOS: 2 days    Doylene Bode 02/22/2024, 8:55 AM

## 2024-02-23 ENCOUNTER — Telehealth: Payer: Self-pay | Admitting: *Deleted

## 2024-02-23 LAB — SURGICAL PATHOLOGY

## 2024-02-23 NOTE — Telephone Encounter (Signed)
 Spoke with Ms. Gores as well as Bukky from [facility] this morning. She states she is eating, drinking and urinating well. She has had a very small BM and is passing gas. She is taking senokot as prescribed and encouraged her to drink plenty of water. She denies fever or chills. Incisions are dry and intact. She rates her pain 7/10. Her pain is controlled with hydrocodone as needed. They are also aware that pt is to take her eliquis this evening 2.5 mg, twice daily tomorrow for 12 days.    Instructed to call office with any fever, chills, purulent drainage, uncontrolled pain or any other questions or concerns. Patient verbalizes understanding.   Pt aware of post op appointments as well as the office number [phone] and after hours number [phone] to call if she has any questions or concerns

## 2024-02-26 ENCOUNTER — Telehealth: Payer: Self-pay | Admitting: *Deleted

## 2024-02-26 NOTE — Telephone Encounter (Signed)
 Spoke with Ms. Godbee who called Dr. Maxine Glenn office and spoke with Steward Drone. Pt was called back and states that she hasn't had a bowel movement since the very small one she had last Friday?  Pt continues to pass gas. Pt states she is not straining and states the nursing home is giving her hydrocodone.   Advised patient to continue taking Senokot-S  in the evening with 8 oz of water and start taking it in the morning as well with 8 oz. Of water. Add one capfule of miralax twice a day. Stop the miralax for loose stools and add back if needed and adjust according to the consistency of the stools. Pt also advised to transition off of the hydrocodone and take extra strength tylenol for pain only as needed. The opioid is going to cause constipation. Pt verbalized understanding. And advised that the office would call back tomorrow to check on symptoms. Spoke with Bukky from [facility] and she is aware of pt's bowel regime.

## 2024-02-27 ENCOUNTER — Other Ambulatory Visit: Payer: Self-pay | Admitting: Hematology and Oncology

## 2024-02-27 ENCOUNTER — Telehealth: Payer: Self-pay | Admitting: Oncology

## 2024-02-27 DIAGNOSIS — C561 Malignant neoplasm of right ovary: Secondary | ICD-10-CM

## 2024-02-27 NOTE — Telephone Encounter (Signed)
 Left a message regarding restarting chemotherapy on 03/22/24.  Requested a return call.

## 2024-02-27 NOTE — Telephone Encounter (Signed)
 Lori Conrad called back and is ok with restarting chemotherapy on 03/22/24.  Advised her a scheduler will call her back with the appointment time.

## 2024-02-28 ENCOUNTER — Encounter: Payer: Self-pay | Admitting: Hematology and Oncology

## 2024-02-28 ENCOUNTER — Other Ambulatory Visit: Payer: Self-pay

## 2024-02-28 NOTE — Telephone Encounter (Signed)
 Spoke with Lori Conrad this afternoon and patient states she is doing well and had great results with the bowel regime that was advised and she is going to continue taking the senokot-s and add the miralax as needed. Pt thanked the office for calling and had no further concerns or questions at this time.

## 2024-03-04 ENCOUNTER — Encounter: Payer: Self-pay | Admitting: Oncology

## 2024-03-04 ENCOUNTER — Encounter: Payer: Self-pay | Admitting: Psychiatry

## 2024-03-04 ENCOUNTER — Other Ambulatory Visit: Payer: Self-pay | Admitting: Oncology

## 2024-03-04 DIAGNOSIS — K5900 Constipation, unspecified: Secondary | ICD-10-CM | POA: Diagnosis not present

## 2024-03-04 DIAGNOSIS — C561 Malignant neoplasm of right ovary: Secondary | ICD-10-CM | POA: Diagnosis not present

## 2024-03-04 DIAGNOSIS — E039 Hypothyroidism, unspecified: Secondary | ICD-10-CM | POA: Diagnosis not present

## 2024-03-04 NOTE — Progress Notes (Signed)
 Gynecologic Oncology Multi-Disciplinary Disposition Conference Note  Date of the Conference: 03/04/2024  Patient Name: Lori Conrad [facility]  Referring Provider: Dr. Bertis Ruddy Primary GYN Oncologist: Dr. 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 Disposition is to adjuvant chemotherapy.   This Multidisciplinary conference took place involving physicians from Gynecologic Oncology, Medical Oncology, Radiation Oncology, Pathology, Radiology along with the Gynecologic Oncology Nurse Practitioner and Gynecologic Oncology Nurse Navigator.  Comprehensive assessment of the patient's malignancy, staging, need for surgery, chemotherapy, radiation therapy, and need for further testing were reviewed. Supportive measures, both inpatient and following discharge were also discussed. The recommended plan of care is documented. Greater than 35 minutes were spent correlating and coordinating this patient's care.

## 2024-03-07 ENCOUNTER — Encounter: Payer: Self-pay | Admitting: Hematology and Oncology

## 2024-03-08 ENCOUNTER — Telehealth: Payer: Self-pay

## 2024-03-08 ENCOUNTER — Other Ambulatory Visit: Payer: Self-pay | Admitting: Hematology and Oncology

## 2024-03-08 DIAGNOSIS — E782 Mixed hyperlipidemia: Secondary | ICD-10-CM | POA: Diagnosis not present

## 2024-03-08 DIAGNOSIS — H353221 Exudative age-related macular degeneration, left eye, with active choroidal neovascularization: Secondary | ICD-10-CM | POA: Diagnosis not present

## 2024-03-08 DIAGNOSIS — H43813 Vitreous degeneration, bilateral: Secondary | ICD-10-CM | POA: Diagnosis not present

## 2024-03-08 DIAGNOSIS — H35033 Hypertensive retinopathy, bilateral: Secondary | ICD-10-CM | POA: Diagnosis not present

## 2024-03-08 DIAGNOSIS — E039 Hypothyroidism, unspecified: Secondary | ICD-10-CM | POA: Diagnosis not present

## 2024-03-08 DIAGNOSIS — H353112 Nonexudative age-related macular degeneration, right eye, intermediate dry stage: Secondary | ICD-10-CM | POA: Diagnosis not present

## 2024-03-08 DIAGNOSIS — H5319 Other subjective visual disturbances: Secondary | ICD-10-CM | POA: Diagnosis not present

## 2024-03-08 NOTE — Telephone Encounter (Signed)
 Returned her call and reviewed appts. She verbalized understanding.

## 2024-03-11 ENCOUNTER — Inpatient Hospital Stay (HOSPITAL_BASED_OUTPATIENT_CLINIC_OR_DEPARTMENT_OTHER): Admitting: Psychiatry

## 2024-03-11 VITALS — BP 126/81 | HR 88 | Temp 97.8°F | Resp 18 | Wt 104.0 lb

## 2024-03-11 DIAGNOSIS — Z7189 Other specified counseling: Secondary | ICD-10-CM

## 2024-03-11 DIAGNOSIS — C561 Malignant neoplasm of right ovary: Secondary | ICD-10-CM

## 2024-03-11 DIAGNOSIS — Z90710 Acquired absence of both cervix and uterus: Secondary | ICD-10-CM

## 2024-03-11 DIAGNOSIS — Z9221 Personal history of antineoplastic chemotherapy: Secondary | ICD-10-CM | POA: Diagnosis not present

## 2024-03-11 DIAGNOSIS — Z90722 Acquired absence of ovaries, bilateral: Secondary | ICD-10-CM | POA: Diagnosis not present

## 2024-03-11 DIAGNOSIS — Z9079 Acquired absence of other genital organ(s): Secondary | ICD-10-CM | POA: Diagnosis not present

## 2024-03-11 NOTE — Progress Notes (Signed)
 Gynecologic Oncology Return Clinic Visit  Date of Service: 03/11/2024 Referring Provider: Artis Delay, MD [address] [place],  [place] [address]  Assessment & Plan: Lori Conrad is a 78 y.o. 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 Eugene Garnet, now s/p 4 cycles of NACT (last given on 01/26/24) . Now s/p RA-TLH, BSO, omentectomy, tumor debulking, appendectomy, enterolysis, small bowel oversew, right ureterolysis on 02/20/24 with a foci of residual carcinoma removed from the rectal serosa, all other tissue treated, without active dsease  Postop: - Pt recovering well from surgery and healing appropriately postoperatively - Ongoing postoperative expectations and precautions reviewed. Continue with no lifting >10lbs through 6 weeks postoperatively  Ovarian cancer: - S/p 4C NACT. - Intraoperative findings and pathology results reviewed. Only small foci of residual active disease, all other else treated. 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 NGS HRD neg. - Recommend adjuvant chemo for 2-3 more cycles. - Surveillance of ovarian cancer reviewed   RTC for surveillance following completion of chemo.  Clide Cliff, MD Gynecologic Oncology   Medical Decision Making I personally spent  TOTAL 20 minutes face-to-face and non-face-to-face in the care of this patient, which includes all pre, intra, and post visit time on the date of service. The discussion of ovarian cancer treatment is beyond the scope of routine postoperative care.   ----------------------- Reason for Visit: Postop/Treatment counseling  Treatment History: Oncology History Overview Note  High grade serous, p53 null type. 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 EXAM: IMPLANTED PORT A CATH PLACEMENT WITH ULTRASOUND AND FLUOROSCOPIC GUIDANCE COMPARISON:  None Available. MEDICATIONS: None. ANESTHESIA/SEDATION: Moderate (conscious) sedation was employed during this procedure. A total of Versed 1 mg and Fentanyl 50 mcg was administered intravenously. Moderate Sedation Time: 15 minutes. The patient's level of consciousness and vital signs were monitored continuously by radiology nursing throughout the procedure under my direct supervision. CONTRAST:  None FLUOROSCOPY TIME:  One mGy COMPLICATIONS: None immediate. PROCEDURE: The procedure, risks, benefits, and alternatives were explained to the patient. Questions regarding the procedure were encouraged and answered. The patient understands and consents to the procedure. The right neck and chest were prepped with chlorhexidine in a sterile fashion, and a sterile drape was applied covering the operative field. Maximum barrier sterile technique with sterile gowns and gloves were used for the procedure. A timeout was performed prior to the initiation of the procedure. Ultrasound was used to examine the jugular vein which was compressible and free of internal echoes. A skin marker was used to demarcate the planned venotomy and port pocket incision sites. Local anesthesia was provided to these sites and the subcutaneous tunnel track with 1% lidocaine with 1:100,000 epinephrine. A small incision was created at the jugular access site and blunt dissection was performed of the subcutaneous tissues. Under ultrasound guidance, the jugular vein was accessed with a 21 ga micropuncture needle and an 0.018" wire was inserted to the superior vena cava. Real-time ultrasound guidance was utilized for vascular access including the acquisition of a permanent ultrasound image  documenting patency of the accessed vessel. A 5 Fr micopuncture set was then used, through which a 0.035" Rosen wire was  passed under fluoroscopic guidance into the inferior vena cava. An 8 Fr dilator was then placed over the wire. A subcutaneous port pocket was then created along the upper chest wall utilizing a combination of sharp and blunt dissection. The pocket was irrigated with sterile saline, packed with gauze, and observed for hemorrhage. A single lumen plastic power injectable port was chosen for placement. The 8 Fr catheter was tunneled from the port pocket site to the venotomy incision. The port was placed in the pocket. The external catheter was trimmed to appropriate length. The dilator was exchanged for an 8 Fr peel-away sheath under fluoroscopic guidance. The catheter was then placed through the sheath and the sheath was removed. Final catheter positioning was confirmed and documented with a fluoroscopic spot radiograph. The port was accessed with a Huber needle, aspirated, and flushed with heparinized saline. The deep dermal layer of the port pocket incision was closed with interrupted 3-0 Vicryl suture. Dermabond was then placed over the port pocket and neck incisions. The patient tolerated the procedure well without immediate post procedural complication. FINDINGS: After catheter placement, the tip lies within the superior cavoatrial junction. The catheter aspirates and flushes normally and is ready for immediate use. IMPRESSION: Successful placement of a power injectable Port-A-Cath via the right internal jugular vein. The catheter is ready for immediate use. Creasie Doctor, MD [facility] Electronically Signed   By: Creasie Doctor M.D.   On: 11/01/2023 13:22   MR ABDOMEN W WO CONTRAST  Result Date: 10/31/2023 CLINICAL DATA:  Pelvic mass.  Liver lesion EXAM: MRI ABDOMEN WITHOUT AND WITH CONTRAST TECHNIQUE: Multiplanar multisequence MR  imaging of the abdomen was performed both before and after the administration of intravenous contrast. CONTRAST:  5mL GADAVIST GADOBUTROL 1 MMOL/ML IV SOLN COMPARISON:  CT 10/30/2023 and older. FINDINGS: Lower chest: Trace pleural fluid. Hepatobiliary: Numerous bright T2, low T1 nonenhancing foci identified consistent with benign cystic lesions. Many of these are under 15 mm. There are some larger foci identified such as segment 4 measuring 4.3 cm and caudate measuring 2.8 cm. Prior CT did demonstrate 1 lesion which is more complex anteriorly in the left hepatic lobe, segment 3 which on today's examination when taking into account motion show some progressive enhancement, is bright on T2 but not as bright as simple fluid and consistent with a small hemangioma. No specific aggressive liver lesion clearly identified today. Patent portal vein. Gallbladder is nondilated. No biliary ductal dilatation. Pancreas: Ectatic pancreatic duct diffusely measuring up to 6 mm, severe. No pancreatic focal atrophy, abnormal enhancement or abnormal T1 signal. No restricted diffusion along the pancreas. Spleen:  Within normal limits in size and appearance. Adrenals/Urinary Tract: Adrenal glands are preserved. No enhancing renal mass or collecting system dilatation. Stomach/Bowel: Visualized bowel is nondilated. This includes visualized portions of the small and large bowel. The stomach is underdistended. Vascular/Lymphatic: Normal caliber aorta and IVC. Atherosclerotic changes along the aorta. Circumaortic left renal vein. Once again there is a abnormal lymph node identified anterior to the aorta in the upper abdomen on series 1602, image 58 measuring 2.3 x 1.8 cm. Few other small retroperitoneal nodes identified. Other:  Trace ascites.  Mesenteric stranding. Musculoskeletal: Curvature and degenerative changes along the spine. IMPRESSION: Multiple benign-appearing liver lesions including cysts and 1 hemangioma. Persistent enlarged  upper abdominal retroperitoneal lymph node. Additional smaller but prominent nodes as well. With the pelvic findings these are worrisome for potential spread of neoplasm. Mild ascites. Electronically Signed  By: Karen Kays M.D.   On: 10/31/2023 14:10   MR PELVIS W WO CONTRAST  Result Date: 10/31/2023 CLINICAL DATA:  Pelvic mass EXAM: MRI PELVIS WITHOUT AND WITH CONTRAST TECHNIQUE: Multiplanar multisequence MR imaging of the pelvis was performed both before and after administration of intravenous contrast. CONTRAST:  5mL GADAVIST GADOBUTROL 1 MMOL/ML IV SOLN COMPARISON:  CT 10/30/2019 FINDINGS: Urinary Tract: Bladder is mildly distended with fluid. There is some mass effect along the posterior aspect of the bladder related to the adjacent complex mass. The bladder itself appears grossly intact. No abnormal wall enhancement. Grossly preserved course of the urethra. Bowel: The visualized bowel in the pelvis is nondilated. However there is lobular masslike area along the distal sigmoid colon with the areas of heterogeneous enhancement. The dominant lesion in this location on series 4, image 29 measures 3.7 by 2.6 cm. There are several adjacent soft tissue nodules as well such as just posterior to the cervix and anterior to the bowel measuring 10 mm on series 22 image 30. Focus superior left lateral series 22, image 26 measures 2.3 x 1.7 cm. Additional foci elsewhere dependently in the pelvis including the presacral space. Vascular/Lymphatic: Atherosclerotic changes identified along the iliac vessels. No separate nodal enlargement identified. Reproductive: Uterus measures 8.2 by 2.0 by 3.4 cm. Endometrial stripe is less than 3 mm. Slightly heterogeneous myometrium. Anterior to the uterus is a large complex cystic and solid mass with heterogeneous enhancement of the solid component. Lesion measures 11.5 by 7.6 by 7.4 cm. The more solid component is right lateral inferior with a aggressive in enhancement measuring  proximally 7.7 by 5.6 cm. Cephalocaudal length 8.5 cm. The cystic component more towards the left has a dimensions approaching 8.7 cm. There is surrounding free fluid and edema. Although this more in the central pelvis towards midline a right ovary is not seen as a separate structure. There is what appears to be a small postmenopausal of the left ovary measuring 15 mm. An ovarian neoplasm is a strong consideration. Other:  Small amount of free fluid in the pelvis.  Edema. Musculoskeletal: Curvature of the spine. Moderate degenerative changes of the lumbar spine with disc bulging and areas of stenosis greatest at L4-5. Degenerative changes of the pelvis and hips as well. Study is somewhat limited due to some artifacts postcontrast axial dataset as well as study being performed as a standard pelvis rather than a gynecologic pelvis exam. Please see separate dictation of abdomen MRI. IMPRESSION: Large complex cystic and solid pelvic mass centrally measuring up to 11.5 x 7.6 x 7.4 cm. Based on overall appearance this has worrisome for a neoplasm including an ovarian malignancy. Small amount of ascites in the pelvis. Soft tissue enhancing aggressive nodules along the course of the sigmoid colon as well as in the adjacent fat and presacral spaces. With the larger central mass this very well could be spread of disease to adjacent structures rather than a primary colonic process but correlate with symptoms and if needed colonoscopy. Please see separate dictation of abdominal MRI. Electronically Signed   By: Karen Kays M.D.   On: 10/31/2023 14:01   CT CHEST W CONTRAST  Result Date: 10/31/2023 CLINICAL DATA:  78 year old female with suspected gynecologic malignancy. * Tracking Code: BO * EXAM: CT CHEST WITH CONTRAST TECHNIQUE: Multidetector CT imaging of the chest was performed during intravenous contrast administration. RADIATION DOSE REDUCTION: This exam was performed according to the departmental dose-optimization  program which includes automated exposure control, adjustment of the  mA and/or kV according to patient size and/or use of iterative reconstruction technique. CONTRAST:  75mL OMNIPAQUE IOHEXOL 350 MG/ML SOLN COMPARISON:  None Available. FINDINGS: Cardiovascular: The heart size is mildly enlarged. No pericardial effusion. Aortic atherosclerosis and coronary artery calcification. Mediastinum/Nodes: Trachea and esophagus appear unremarkable. The right lobe of thyroid gland appears surgically absent. No mediastinal or hilar adenopathy. Lungs/Pleura: No pleural effusion identified. Subsegmental atelectasis identified within the lingula and bilateral posterior lung bases. No signs of interstitial edema or airspace consolidation. No suspicious pulmonary nodule identified to suggest lung metastases. Upper Abdomen: No acute abnormality. Multiple liver cysts. Enlarged lymph node within the portal caval region measures 1.5 cm, image 155/3. Defer to report from CT AP dated 10/30/2019 for and same-day MRI of the abdomen pelvis for further details. Musculoskeletal: Mild curvature of the thoracic spine and lumbar spine is convex towards the left. Multilevel degenerative disc disease. No acute or suspicious osseous lesions. IMPRESSION: 1. No signs of metastatic disease to the chest. 2. Areas of subsegmental atelectasis noted within bilateral posterior lung bases and lingula. 3. Enlarged upper abdominal lymph node as above. In the setting of a known malignancy this is concerning for nodal metastasis. 4. Coronary artery calcifications. 5.  Aortic Atherosclerosis (ICD10-I70.0). Electronically Signed   By: Kimberley Penman M.D.   On: 10/31/2023 05:57   CT ABDOMEN PELVIS W CONTRAST  Result Date: 10/30/2023 CLINICAL DATA:  Abdominal pain. EXAM: CT ABDOMEN AND PELVIS WITH CONTRAST TECHNIQUE: Multidetector CT imaging of the abdomen and pelvis was performed using the standard protocol following bolus administration of intravenous  contrast. RADIATION DOSE REDUCTION: This exam was performed according to the departmental dose-optimization program which includes automated exposure control, adjustment of the mA and/or kV according to patient size and/or use of iterative reconstruction technique. CONTRAST:  75mL OMNIPAQUE IOHEXOL 350 MG/ML SOLN COMPARISON:  Limb 1424 FINDINGS: Lower chest: No acute findings. Hepatobiliary: Multiple hepatic cysts evident. Scattered tiny hypodensities in the liver parenchyma are too small to characterize but are statistically most likely benign. No followup imaging is recommended. Tiny subcapsular lesion measured previously at 9 mm in the anterior left liver is stable on image 23/3 today, nonspecific. There is no evidence for gallstones, gallbladder wall thickening, or pericholecystic fluid. No intrahepatic or extrahepatic biliary dilation. Pancreas: Dilatation of the pancreatic duct to the head and body of pancreas is similar to prior. Spleen: No splenomegaly. No suspicious focal mass lesion. Adrenals/Urinary Tract: No adrenal nodule or mass. Kidneys unremarkable. No evidence for hydroureter. Bladder is distended. Stomach/Bowel: Stomach is unremarkable. No gastric wall thickening. No evidence of outlet obstruction. Duodenum is normally positioned as is the ligament of Treitz. No small bowel wall thickening. No small bowel dilatation. Diverticular changes are noted in the left colon without evidence of diverticulitis. Vascular/Lymphatic: 16 mm short axis portal caval lymph node seen on 22/3. No para-aortic lymphadenopathy. No pelvic sidewall lymphadenopathy. Reproductive: Multiple uterine fibroids evident. As noted on prior study there is an area of the anterior cervix that appears to obliterate the fat plane between the cervix and the posterior wall of the bladder (see sagittal 86/7). Small soft tissue nodules are again noted in the cul-de-sac some of which may pertain to diverticuli, but others raise concern for  peritoneal nodularity (see images 61 and 56 of series 3). Other: No substantial free fluid. Musculoskeletal: No worrisome lytic or sclerotic osseous abnormality. IMPRESSION: 1. Multiple uterine fibroids. As noted on prior study there is an area of the anterior cervix that appears to obliterate  the fat plane between the cervix and the posterior wall of the bladder. This is concerning for a cervical mass. Gynecologic consultation recommended. 2. Small soft tissue nodules in the cul-de-sac some of which may relate to diverticuli, but others raise concern for peritoneal nodularity. Attention on follow-up recommended. PET-CT may prove helpful to further evaluate 3. 16 mm short axis portal caval lymph node, metastatic disease not excluded. 4. Left colonic diverticulosis without diverticulitis. Electronically Signed   By: Donnal Fusi M.D.   On: 10/30/2023 13:08   CT ABDOMEN PELVIS W CONTRAST  Result Date: 10/26/2023 CLINICAL DATA:  Pt w/ abnormal US ; mass in pelvis; no h/o cancer; no pain; no urinary issues EXAM: CT ABDOMEN AND PELVIS WITH CONTRAST TECHNIQUE: Multidetector CT imaging of the abdomen and pelvis was performed using the standard protocol following bolus administration of intravenous contrast. RADIATION DOSE REDUCTION: This exam was performed according to the departmental dose-optimization program which includes automated exposure control, adjustment of the mA and/or kV according to patient size and/or use of iterative reconstruction technique. CONTRAST:  ISOVUE-300 IOPAMIDOL (ISOVUE-300) INJECTION 61% COMPARISON:  Ultrasound pelvis 09/01/2023, ultrasound thyroid 10/10/2023 FINDINGS: Lower chest: Mitral annular calcification. Aortic valve leaflet calcification. No acute abnormality. Hepatobiliary: Multiple fluid density lesions scattered throughout the left right hepatic lobe. There an indeterminate 0.9 cm left hepatic lobe hypodensity with Hounsfield unit of 77 (2:29). No gallstones, gallbladder  wall thickening, or pericholecystic fluid. No biliary dilatation. Pancreas: No focal lesion. Normal pancreatic contour. No surrounding inflammatory changes. No main pancreatic ductal dilatation. Spleen: Normal in size without focal abnormality. Adrenals/Urinary Tract: No adrenal nodule bilaterally. Bilateral kidneys enhance symmetrically. No hydronephrosis. No hydroureter. The urinary bladder is unremarkable. On delayed imaging, there is no urothelial wall thickening and there are no filling defects in the opacified portions of the bilateral collecting systems or ureters. Stomach/Bowel: Stomach is within normal limits. No evidence of small bowel wall thickening or dilatation. Increased stool burden proximal to the distal sigmoid colon mass with stool throughout the ascending, transverse, descending colon. Short segment of distal sigmoid colon irregular bowel wall thickening (5:26, 2:62). No large bowel luminal dilatation. Colonic diverticulosis appendix appears normal. Vascular/Lymphatic: No abdominal aorta or iliac aneurysm. 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 No abdominal, pelvic, or inguinal lymphadenopathy. Reproductive: There is a heterogeneous solid and cystic 11 x 9 cm mass arising from the uterine fundus. Finding is noted to invade into the urinary bladder dome (6:81, 5:56 close) where there is loss of intraperitoneal and lower ring of the urinary bladder wall margin. The mass is noted to abut and appears to be inseparable from a short segment of distal sigmoid colon in the region of irregular bowel wall thickening. Other: No intraperitoneal free fluid. No intraperitoneal free gas. No organized fluid collection. Musculoskeletal: No abdominal wall hernia or abnormality. No suspicious lytic or blastic osseous lesions. No acute displaced fracture. Multilevel severe degenerative changes of the spine. Grade 1 anterolisthesis of L4  on L5 and L5 on S1. Mild retrolisthesis of L2 on L3 and L3 on L4. Dextroscoliosis centered at the L3-L4 level. IMPRESSION: 1. An 11 x 9 cm heterogeneous solid and cystic mass arises from the uterine fundus and is noted to invade the urinary bladder dome wall as well as the distal sigmoid colon. No associated bowel obstruction; however, constipation proximal to irregular bowel wall thickening/mass. No associated stercoral colitis. Finding consistent with malignancy. Recommend gynecologic consultation. When the  patient is clinically stable and able to follow directions and hold their breath (preferably as an outpatient) further evaluation with dedicated MRI with and without contrast should be considered. 2. Indeterminate 0.9 cm left hepatic lobe hypodense lesion with a density of 77 HU. Question metastasis versus primary hepatic lesion. 3. Stool throughout the colon 4. Colonic diverticulosis with no acute diverticulitis. 5. 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 6.  Aortic Atherosclerosis (ICD10-I70.0)-severe. These results will be called to the ordering clinician or representative by the Radiologist Assistant, and communication documented in the PACS or [employer]. Electronically Signed   By: Morgane  Naveau M.D.   On: 10/26/2023 18:42   DG BONE DENSITY (DXA)  Result Date: 10/26/2023 EXAM: DUAL X-RAY ABSORPTIOMETRY (DXA) FOR BONE MINERAL DENSITY IMPRESSION: Referring Physician:  Ruven Coy Your patient completed a bone mineral density test using GE Lunar iDXA system (analysis version: 16). Technologist: BEC PATIENT: Name: Melenda, Bielak Patient ID: [id number] Birth Date: 10-30-1946 Height: 60.5 in. Sex: Female Measured: 10/26/2023 Weight: 110.2 lbs. Indications: Advanced Age, Caucasian, Estrogen Deficient, Height Loss (781.91), History of Osteoporosis, Levothyroxine, Postmenopausal Fractures: Left Ankle Treatments: Calcium (E943.0), Vitamin D (E933.5) ASSESSMENT: The BMD measured at Forearm  Radius 33% is 0.624 g/cm2 with a T-score of -2.9. This patient's diagnostic category is OSTEOPOROSIS according to World Health Organization [facility]) criteria. Comparison to 10/16/2019. Since the prior study, there has been a SIGNIFICANT DECREASE in bone mineral density of the hips (-4.6%). The lumbar spine was excluded due to being excluded from prior exam. The quality of the exam is good. Site Region Measured Date Measured Age YA BMD Significant CHANGE T-score Right Forearm Radius 33% 10/26/2023 78.0 -2.9 0.624 g/cm2 Right Forearm Radius 33% 10/16/2019 74.0 -2.6 0.653 g/cm2 DualFemur Neck Left 10/26/2023 78.0 -0.6 0.952 g/cm2 DualFemur Neck Left 10/16/2019 74.0 -0.5 0.962 g/cm2 DualFemur Total Mean 10/26/2023 78.0 -0.7 0.918 g/cm2 * DualFemur Total Mean 10/16/2019 74.0 -0.4 0.962 g/cm2 World Health Organization [facility]) criteria for post-menopausal, Caucasian Women: Normal       T-score at or above -1 SD Osteopenia   T-score between -1 and -2.5 SD Osteoporosis T-score at or below -2.5 SD RECOMMENDATION: 1. All patients should optimize calcium and vitamin D intake. 2. Consider FDA-approved medical therapies in postmenopausal women and men aged 110 years and older, based on the following: a. A hip or vertebral (clinical or morphometric) fracture. b. T-score = -2.5 at the femoral neck or spine after appropriate evaluation to exclude secondary causes. c. Low bone mass (T-score between -1.0 and -2.5 at the femoral neck or spine) and a 10-year probability of a hip fracture = 3% or a 10-year probability of a major osteoporosis-related fracture = 20% based on the [place] -adapted WHO algorithm. d. Clinician judgment and/or patient preferences may indicate treatment for people with 10-year fracture probabilities above or below these levels. FOLLOW-UP: Patients with diagnosis of osteoporosis or at high risk for fracture should have regular bone mineral density tests.? Patients eligible for Medicare are allowed routine testing every 2  years.? The testing frequency can be increased to one year for patients who have rapidly progressing disease, are receiving or discontinuing medical therapy to restore bone mass, or have additional risk factors. I have reviewed this study and agree with the findings. [facility] Electronically Signed   By: Sundra Engel M.D.   On: 10/26/2023 12:30      11/08/2023 Pathology Results   SURGICAL PATHOLOGY CASE: [phone] PATIENT: Melba Spittle Surgical Pathology Report  Clinical History:  Pelvic mass, suspected malignancy (crm)   FINAL MICROSCOPIC DIAGNOSIS:  A. PELVIC SIDEWALL NODULE, RIGHT, EXCISION: - High-grade serous carcinoma, see comment  B. ABDOMINAL WALL #1, ANTERIOR, EXCISION: - High-grade serous carcinoma  COMMENT:  A.  Immunohistochemical stain show that the tumor cells are positive for CK7, PAX8, and p16 (diffuse overexpression).  Immunostain for p53 shows a clonal null expression pattern.  Immunostains for CK20 is negative. This immunoprofile is consistent with the above interpretation and suggestive of an ovarian primary.    11/14/2023 Initial Diagnosis   Malignant ovarian neoplasm, right (HCC)   11/14/2023 Cancer Staging   Staging form: Ovary, Fallopian Tube, and Primary Peritoneal Carcinoma, AJCC 8th Edition - Clinical stage from 11/14/2023: FIGO Stage IIIC (cT3c, cN1, cM0) - Signed by Artis Delay, MD on 11/14/2023 Stage prefix: Initial diagnosis   11/20/2023 Tumor Marker   Patient's tumor was tested for the following markers: CA-125. Results of the tumor marker test revealed 169.   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  No other deleterious variants detected in [facility] CancerNext-Expanded +RNAinsight Panel.  Report date is 12/26/2023.   The  CancerNext-Expanded gene panel offered by [facility] and includes sequencing, rearrangement, and RNA analysis for the following 76 genes: AIP, ALK, APC, ATM, AXIN2, BAP1, BARD1, BMPR1A, BRCA1, BRCA2, BRIP1, CDC73, CDH1, CDK4, CDKN1B, CDKN2A, CEBPA, CHEK2, CTNNA1, DDX41, DICER1, ETV6, FH, FLCN, GATA2, LZTR1, MAX, MBD4, MEN1, MET, MLH1, MSH2, MSH3, MSH6, MUTYH, NF1, NF2, NTHL1, PALB2, PHOX2B, PMS2, POT1, PRKAR1A, PTCH1, PTEN, RAD51C, RAD51D, RB1, RET, RUNX1, SDHA, SDHAF2, SDHB, SDHC, SDHD, SMAD4, SMARCA4, SMARCB1, SMARCE1, STK11, SUFU, TMEM127, TP53, TSC1, TSC2, VHL, and WT1 (sequencing and deletion/duplication); EGFR, HOXB13, KIT, MITF, PDGFRA, POLD1, and POLE (sequencing only); EPCAM and GREM1 (deletion/duplication only).     01/05/2024 Tumor Marker   Patient's tumor was tested for the following markers: CA-125. Results of the tumor marker test revealed 130.   01/18/2024 Imaging   CT ABDOMEN PELVIS W CONTRAST Result Date: 01/24/2024 CLINICAL DATA:  78 year old female with history of ovarian cancer status post chemotherapy. Follow-up study. * Tracking Code: BO * EXAM: CT ABDOMEN AND PELVIS WITH CONTRAST TECHNIQUE: Multidetector CT imaging of the abdomen and pelvis was performed using the standard protocol following bolus administration of intravenous contrast. RADIATION DOSE REDUCTION: This exam was performed according to the departmental dose-optimization program which includes automated exposure control, adjustment of the mA and/or kV according to patient size and/or use of iterative reconstruction technique. CONTRAST:  80mL OMNIPAQUE IOHEXOL 300 MG/ML  SOLN COMPARISON:  CT of the abdomen and pelvis 12/07/2023. FINDINGS: Lower chest: 2.8 x 0.7 cm new area of pleural-based nodularity in the right lower lobe (axial image 5 of series 2) concerning for probable metastatic lesion. Hepatobiliary: Multiple well-defined low-attenuation lesions scattered throughout the liver, largest of which are all compatible  with simple cysts, measuring up to 4.4 x 2.7 cm in segment 4A. Multiple other subcentimeter low-attenuation lesions, too small to definitively characterize, but similar to the prior study and statistically likely to represent tiny cysts and/or biliary hamartomas. Gallbladder is unremarkable in appearance. Pancreas: No pancreatic mass. No pancreatic ductal dilatation. No pancreatic or peripancreatic fluid collections or inflammatory changes. Spleen: Unremarkable. Adrenals/Urinary Tract: Bilateral kidneys and bilateral adrenal glands are normal in appearance. No hydroureteronephrosis. Urinary bladder is unremarkable  in appearance. Stomach/Bowel: Appearance of the stomach is normal. There is no pathologic dilatation of small bowel or colon. Numerous colonic diverticula are noted, without definite focal surrounding inflammatory changes to indicate an acute diverticulitis at this time. Previously noted mass in the sigmoid colon is not readily apparent on today's examination. The appendix is not confidently identified and may be surgically absent. Regardless, there are no inflammatory changes noted adjacent to the cecum to suggest the presence of an acute appendicitis at this time. Vascular/Lymphatic: Atherosclerotic calcifications are noted throughout the abdominal aorta and pelvic vasculature. Retroaortic left renal vein (normal anatomical variant) incidentally noted. No definite lymphadenopathy confidently identified in the abdomen or pelvis. Reproductive: Status post hysterectomy. Left ovary is unremarkable in appearance. Right adnexal mass has regressed when compared to the prior examination, currently measuring 7.8 x 7.4 x 7.7 cm (axial image 51 of series 2 and coronal image 51 of series 5). Other: Previously noted peritoneal implants in the low pelvis appear to have regressed compared to the prior examination. No new peritoneal implants confidently identified. No significant volume of ascites. No pneumoperitoneum.  Musculoskeletal: There are no aggressive appearing lytic or blastic lesions noted in the visualized portions of the skeleton. IMPRESSION: 1. Today's study demonstrates a positive response to therapy with regression of previously noted right adnexal mass and peritoneal implants in the low pelvis. 2. However, there is a new pleural-based nodule in the right lower lobe concerning for a metastatic lesion. 3. Colonic diverticulosis without evidence of acute diverticulitis at this time. 4. Aortic atherosclerosis. Aortic Atherosclerosis (ICD10-I70.0). Electronically Signed   By: Alexandria Angel M.D.   On: 01/24/2024 10:40      01/26/2024 Tumor Marker   Patient's tumor was tested for the following markers: CA-125. Results of the tumor marker test revealed 60.6.   02/03/2024 Imaging   CT Chest W Contrast Result Date: 02/08/2024 CLINICAL DATA:  Pulmonary nodule. Ovarian cancer. * Tracking Code: BO * EXAM: CT CHEST WITH CONTRAST TECHNIQUE: Multidetector CT imaging of the chest was performed during intravenous contrast administration. RADIATION DOSE REDUCTION: This exam was performed according to the departmental dose-optimization program which includes automated exposure control, adjustment of the mA and/or kV according to patient size and/or use of iterative reconstruction technique. CONTRAST:  75mL OMNIPAQUE IOHEXOL 300 MG/ML  SOLN COMPARISON:  CT abdomen pelvis 01/18/2024 and CT chest 10/30/2023. FINDINGS: Cardiovascular: Right IJ Port-A-Cath terminates in the low SVC. Atherosclerotic calcification of the aorta, aortic valve and coronary arteries. Heart is at the upper limits of normal in size to mildly enlarged. No pericardial effusion. Enlarged pulmonic trunk. Mediastinum/Nodes: No pathologically enlarged mediastinal, hilar or axillary lymph nodes. Esophagus is grossly unremarkable. Lungs/Pleura: Subpleural scarring in the medial right lower lobe with somewhat of a plaque like area superiorly, measuring 0.7 x 2.7  cm (8/109). Finding is unchanged from 01/18/2024 and new from 10/30/2023. Otherwise, no suspicious pulmonary nodules. No pleural fluid. Airway is unremarkable. Upper Abdomen: Multiple hepatic cysts. Visualized portions of the liver, adrenal glands, kidneys, spleen, pancreas, stomach and bowel are otherwise grossly unremarkable. No upper abdominal adenopathy. Musculoskeletal: Degenerative changes in the spine. No worrisome lytic or sclerotic lesions. IMPRESSION: 1. Subpleural scarring in the medial right lower lobe with somewhat of a plaque-like area superiorly, as on 01/18/2024, new from 10/30/2023. Scarring or loculated pleural fluid is favored. Metastatic disease cannot be excluded. Recommend attention on follow-up. 2. Otherwise, no evidence of metastatic disease in the chest. 3. Aortic atherosclerosis (ICD10-I70.0). Coronary artery calcification. 4. Enlarged pulmonic trunk,  indicative of pulmonary arterial hypertension. Electronically Signed   By: Shearon Denis M.D.   On: 02/08/2024 14:09   CT ABDOMEN PELVIS W CONTRAST Result Date: 01/24/2024 CLINICAL DATA:  78 year old female with history of ovarian cancer status post chemotherapy. Follow-up study. * Tracking Code: BO * EXAM: CT ABDOMEN AND PELVIS WITH CONTRAST TECHNIQUE: Multidetector CT imaging of the abdomen and pelvis was performed using the standard protocol following bolus administration of intravenous contrast. RADIATION DOSE REDUCTION: This exam was performed according to the departmental dose-optimization program which includes automated exposure control, adjustment of the mA and/or kV according to patient size and/or use of iterative reconstruction technique. CONTRAST:  80mL OMNIPAQUE IOHEXOL 300 MG/ML  SOLN COMPARISON:  CT of the abdomen and pelvis 12/07/2023. FINDINGS: Lower chest: 2.8 x 0.7 cm new area of pleural-based nodularity in the right lower lobe (axial image 5 of series 2) concerning for probable metastatic lesion. Hepatobiliary:  Multiple well-defined low-attenuation lesions scattered throughout the liver, largest of which are all compatible with simple cysts, measuring up to 4.4 x 2.7 cm in segment 4A. Multiple other subcentimeter low-attenuation lesions, too small to definitively characterize, but similar to the prior study and statistically likely to represent tiny cysts and/or biliary hamartomas. Gallbladder is unremarkable in appearance. Pancreas: No pancreatic mass. No pancreatic ductal dilatation. No pancreatic or peripancreatic fluid collections or inflammatory changes. Spleen: Unremarkable. Adrenals/Urinary Tract: Bilateral kidneys and bilateral adrenal glands are normal in appearance. No hydroureteronephrosis. Urinary bladder is unremarkable in appearance. Stomach/Bowel: Appearance of the stomach is normal. There is no pathologic dilatation of small bowel or colon. Numerous colonic diverticula are noted, without definite focal surrounding inflammatory changes to indicate an acute diverticulitis at this time. Previously noted mass in the sigmoid colon is not readily apparent on today's examination. The appendix is not confidently identified and may be surgically absent. Regardless, there are no inflammatory changes noted adjacent to the cecum to suggest the presence of an acute appendicitis at this time. Vascular/Lymphatic: Atherosclerotic calcifications are noted throughout the abdominal aorta and pelvic vasculature. Retroaortic left renal vein (normal anatomical variant) incidentally noted. No definite lymphadenopathy confidently identified in the abdomen or pelvis. Reproductive: Status post hysterectomy. Left ovary is unremarkable in appearance. Right adnexal mass has regressed when compared to the prior examination, currently measuring 7.8 x 7.4 x 7.7 cm (axial image 51 of series 2 and coronal image 51 of series 5). Other: Previously noted peritoneal implants in the low pelvis appear to have regressed compared to the prior  examination. No new peritoneal implants confidently identified. No significant volume of ascites. No pneumoperitoneum. Musculoskeletal: There are no aggressive appearing lytic or blastic lesions noted in the visualized portions of the skeleton. IMPRESSION: 1. Today's study demonstrates a positive response to therapy with regression of previously noted right adnexal mass and peritoneal implants in the low pelvis. 2. However, there is a new pleural-based nodule in the right lower lobe concerning for a metastatic lesion. 3. Colonic diverticulosis without evidence of acute diverticulitis at this time. 4. Aortic atherosclerosis. Aortic Atherosclerosis (ICD10-I70.0). Electronically Signed   By: Alexandria Angel M.D.   On: 01/24/2024 10:40      02/20/2024 Pathology Results   SURGICAL PATHOLOGY  CASE: [phone]  PATIENT: [facility]  Surgical Pathology Report   Clinical History: ovarian cancer   FINAL MICROSCOPIC DIAGNOSIS:   A. DISTAL RECTAL BIOPSY:  Peritoneum and fibroadipose tissue involved by carcinoma consistent with gynecologic primary.       See comment.   B. SIGMOID  COLON BIOPSY:       Fibromuscular tissue with lymphohistiocytic inflammation.       Negative for malignancy.   C. UTERUS, CERVIX, BILATERAL FALLOPIAN TUBES AND OVARIES, ABDOMINAL  HYSTERTCTOMY AND BILATERAL SALPINGO-OOPHORECTOMY:   Right ovary:  Predominant necrosis with rare degenerated glandular architecture and calcifications consistent with treatment effect.            No residual viable carcinoma identified.            See oncology table.   Cervix:           Unremarkable.           Negative for dysplasia or malignancy.       Endocervix:           Nabothian cysts.           Negative for hyperplasia, atypia or malignancy.       Endometrium:           Benign inactive endometrium.           Negative for hyperplasia, atypia or malignancy.       Myometrium:           Unremarkable.           Negative for  malignancy.       Serosa:           Unremarkable.           Negative for malignancy.       Bilateral fallopian tubes:           Benign fimbriated fallopian tubes.           Negative for malignancy.       Left ovary:           Unremarkable.           Negative for malignancy.  D. OMENTUM:      Benign adipose tissue, negative for malignancy.  E. APPENDIX:      Benign vermiform appendix without significant diagnostic alteration.      Negative for malignancy.  ONCOLOGY TABLE:  OVARY or FALLOPIAN TUBE or PRIMARY PERITONEUM: Resection  Procedure: Total hysterectomy and bilateral salpingo-oophorectomy Specimen Integrity: Intact Tumor Site: Right ovary Tumor Size: 9.5 cm necrotic mass without viable carcinoma Histologic Type: Pre-treated diagnosis: high-grade serous carcinoma Histologic Grade: High-grade Ovarian Surface Involvement: NA Fallopian Tube Surface Involvement: Not identified Implants: Not applicable Lymphatic and/or Vascular Invasion:  Not identified Other Tissue/ Organ Involvement: Peritoneum of the pericolorectal soft tissue Largest Extrapelvic Peritoneal Focus: Not applicable Peritoneal/Ascitic Fluid Involvement: Identified ([id number]) Chemotherapy Response Score (CRS): CRS3 (marked response with no residual cancer) Regional Lymph Nodes: Not applicable (no lymph nodes submitted or found)      Number of Nodes with Metastasis Greater than 10 mm: NA      Number of Nodes with Metastasis 10 mm or Less (excludes isolated tumor cells): NA  Number of Nodes with Isolated Tumor Cells (0.2 mm or less): NA      Number of Lymph Nodes Examined: 0 Distant Metastasis:      Distant Site(s) Involved: None Pathologic Stage Classification (pTNM, AJCC 8th Edition): ypT2b, ypNx Ancillary Studies: Can be performed upon request Representative Tumor Block: A1 (Peritoneum of the pericolorectal soft tissue) Comment(s): See comment ([id number])  COMMENT:  The right ovary contains a  mass lesion with predominant necrosis and rare degenerated glandular architecture with calcifications. The specimen is extensively sampled and there is no residual viable carcinoma identified in the right ovary. However,  the distal rectal biopsy (part A) contains foci of crushed cells that are positive for CK AE1/AE3, PAX8, and WT-1 by immunohistochemical stains. P53 mutant type of staining (null). 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 The staging is rendered as T2b duo to peritoneal involvement in the rectum (pelvic extension below pelvic brim).     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  On entry to abdomen, normal upper abdominal survey with smooth diaphragm, liver, stomach and normal appearing omentum and bowel.  Upon visualization into the pelvis, multiple loops of small bowel adherent to the right adnexal mass, initially occluding view of mass.  Following enterolysis, approximately 8 cm right ovarian mass identified to demonstrate torsion with necrosis, adherent to the right anterior cul-de-sac and bladder serosa.  Otherwise small normal-appearing uterus, left fallopian tube and ovary. Retroperitoneal fibrosis on the right, requiring right ureterolysis. Colon with  diverticulosis.  Two approximately 1 cm areas on the sigmoid and rectum (at the peritoneal reflection) with evidence of treatment effect without overt active disease, both areas excised.  Appendix adherent to the necrotic pelvic mass with disruption and serosa on lysis of adhesions, removed.  Small bowel run with no evidence of mesenteric or serosal disease.  Few areas of deserosalization from enterolysis oversewn.  Omentum without overt active disease but few areas of scarring, likely to represent treated disease. Negative bubble study. R0 resection.     02/20/2024 Pathology Results   SURGICAL PATHOLOGY  CASE: [phone]  PATIENT: [facility]  Surgical Pathology Report   Clinical History: ovarian cancer   FINAL MICROSCOPIC DIAGNOSIS:   A. DISTAL RECTAL BIOPSY:  Peritoneum and fibroadipose tissue involved by carcinoma consistent with  gynecologic primary.       See comment.   B. SIGMOID COLON BIOPSY:       Fibromuscular tissue with lymphohistiocytic inflammation.       Negative for malignancy.   C. UTERUS, CERVIX, BILATERAL FALLOPIAN TUBES AND OVARIES, ABDOMINAL  HYSTERTCTOMY AND BILATERAL SALPINGO-OOPHORECTOMY:   Right ovary:  Predominant necrosis with rare degenerated glandular architecture and  calcifications consistent with treatment effect.            No residual viable carcinoma identified.            See oncology table.   Cervix:           Unremarkable.           Negative for dysplasia or malignancy.       Endocervix:           Nabothian cysts.           Negative for hyperplasia, atypia or malignancy.       Endometrium:           Benign inactive endometrium.           Negative for hyperplasia, atypia or malignancy.  Myometrium:           Unremarkable.           Negative for malignancy.       Serosa:           Unremarkable.           Negative for malignancy.       Bilateral fallopian tubes:           Benign fimbriated fallopian tubes.            Negative for malignancy.       Left ovary:           Unremarkable.           Negative for malignancy.  D. OMENTUM:      Benign adipose tissue, negative for malignancy.  E. APPENDIX:      Benign vermiform appendix without significant diagnostic alteration.      Negative for malignancy.  ONCOLOGY TABLE: OVARY or FALLOPIAN TUBE or PRIMARY PERITONEUM: Resection  Procedure: Total hysterectomy and bilateral salpingo-oophorectomy Specimen Integrity: Intact Tumor Site: Right ovary Tumor Size: 9.5 cm necrotic mass without viable carcinoma Histologic Type: Pre-treated diagnosis: high-grade serous carcinoma Histologic Grade: High-grade Ovarian Surface Involvement: NA Fallopian Tube Surface Involvement: Not identified Implants: Not applicable Lymphatic and/or Vascular Invasion:  Not identified Other Tissue/ Organ Involvement: Peritoneum of the pericolorectal soft tissue Largest Extrapelvic Peritoneal Focus: Not applicable Peritoneal/Ascitic Fluid Involvement: Identified ([id number]) Chemotherapy Response Score (CRS): CRS3 (marked response with no residual cancer) Regional Lymph Nodes: Not applicable (no lymph nodes submitted or found)      Number of Nodes with Metastasis Greater than 10 mm: NA      Number of Nodes with Metastasis 10 mm or Less (excludes isolated tumor cells): NA      Number of Nodes with Isolated Tumor Cells (0.2 mm or less): NA      Number of Lymph Nodes Examined: 0 Distant Metastasis:      Distant Site(s) Involved: None      03/22/2024 Tumor Marker   Patient's tumor was tested for the following markers: CA-125. Results of the tumor marker test revealed 10.7.     Interval History: Pt reports that she is recovering well from surgery. She is not needing anything for pain. She is eating and drinking well. She is voiding without issue and having regular bowel movements. Has lost a few pounds so working on getting back to her baseline. No bleeding.    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      Dilated aortic root (HCC)    Hyperlipidemia    Hypertension    Pt denies   Hypothyroidism    Macular degeneration    Ovarian cancer [facility])     Past Surgical History:  Procedure Laterality Date   ANKLE FRACTURE SURGERY Left    APPENDECTOMY  02/20/2024   Procedure: APPENDECTOMY;  Surgeon: Derrel Flies, MD;  Location: [facility] ORS;  Service: Gynecology;;   Rosary [place] LIFT Bilateral 05/17/2022   Procedure: BILATERAL UPPER BLEPHAROPLASTY WITH PTOSIS REPAIR;  Surgeon: Rodman Clam, MD;  Location: [facility] OR;  Service: Plastics;  Laterality: Bilateral;  1.5 hours   BUNIONECTOMY Left    COLONOSCOPY     EYE SURGERY     FLEXIBLE SIGMOIDOSCOPY N/A 11/02/2023   Procedure: FLEXIBLE SIGMOIDOSCOPY;  Surgeon: Genell Ken, MD;  Location: [facility] ENDOSCOPY;  Service: Gastroenterology;  Laterality: N/A;  IR IMAGING GUIDED PORT INSERTION  11/01/2023   LAPAROSCOPY N/A 11/08/2023   Procedure: LAPAROSCOPY DIAGNOSTIC WITH BIOPSY;  Surgeon: Suzi Essex, MD;  Location: [facility] ORS;  Service: Gynecology;  Laterality: N/A;   REFRACTIVE SURGERY     laser for macular degeneration   ROBOTIC ASSISTED TOTAL HYSTERECTOMY WITH BILATERAL SALPINGO OOPHERECTOMY Bilateral 02/20/2024   Procedure: XI ROBOTIC ASSISTED TOTAL HYSTERECTOMY WITH BILATERAL SALPINGO OOPHORECTOMY, OMENTECTOMY, DEBULKING, ENTEROLYSIS;  Surgeon: Derrel Flies, MD;  Location: [facility] ORS;  Service: Gynecology;  Laterality: Bilateral;   SHOULDER ARTHROSCOPY     TONSILLECTOMY      Family History  Problem Relation Age of Onset   Lung cancer Mother 40 - 20       smoked   Stroke Mother 99   Alzheimer's disease Father    CAD Father        95% occlusion of L Main & RCA, severe descending aorta, arterial & arteriolonephrosclerosis    Cholecystitis Father    Esophagitis Father    Cancer Brother 108 - 20        unknown type, agent orange exposure   Pancreatic cancer Daughter 21   BRCA 1/2 Neg Hx    Breast cancer Neg Hx    Colon cancer Neg Hx    Ovarian cancer Neg Hx    Endometrial cancer Neg Hx    Prostate cancer Neg Hx     Social History   Socioeconomic History   Marital status: Widowed    Spouse name: Not on file   Number of children: 2   Years of education: Not on file   Highest education level: Not on file  Occupational History   Not on file  Tobacco Use   Smoking status: Former    Current packs/day: 0.00    Types: Cigarettes    Quit date: 05/15/1988    Years since quitting: 35.8   Smokeless tobacco: Never  Vaping Use   Vaping status: Never Used  Substance and Sexual Activity   Alcohol use: Yes    Alcohol/week: 14.0 standard drinks of alcohol    Types: 14 drink(s) per week    Comment: white wine   Drug use: No   Sexual activity: Not Currently    Partners: Male  Other Topics Concern   Not on file  Social History Narrative   Not on file   Social Drivers of Health   Financial Resource Strain: Not on file  Food Insecurity: No Food Insecurity (02/21/2024)   Hunger Vital Sign    Worried About Running Out of Food in the Last Year: Never true    Ran Out of Food in the Last Year: Never true  Transportation Needs: Unknown (02/21/2024)   PRAPARE - Transportation    Lack of Transportation (Medical): No    Lack of Transportation (Non-Medical): Patient declined  Physical Activity: Not on file  Stress: Not on file  Social Connections: Socially Isolated (02/21/2024)   Social Connection and Isolation Panel [NHANES]    Frequency of Communication with Friends and Family: Three times a week    Frequency of Social Gatherings with Friends and Family: Twice a week    Attends Religious Services: Never    Database administrator or Organizations: No    Attends Banker Meetings: Never    Marital Status: Widowed    Current Medications:  Current Outpatient Medications:     atorvastatin (LIPITOR) 40 MG tablet, Take 40 mg by mouth at bedtime., Disp: , Rfl:  Cholecalciferol (VITAMIN D) 50 MCG (2000 UT) tablet, Take 2,000 Units by mouth daily., Disp: , Rfl:    dexamethasone (DECADRON) 4 MG tablet, Take 2 tabs at the night before and 2 tab the morning of chemotherapy, every 3 weeks, by mouth x 6 cycles, Disp: 24 tablet, Rfl: 6   Ensure (ENSURE), Take 1 Can by mouth 3 (three) times daily as needed (lack of appetite)., Disp: , Rfl:    lactulose (CHRONULAC) 10 GM/15ML solution, Take 10.5 mLs (7 g total) by mouth 3 (three) times daily as needed for mild constipation., Disp: 236 mL, Rfl: 0   levothyroxine (SYNTHROID) 75 MCG tablet, Take 75 mcg by mouth daily before breakfast., Disp: , Rfl:    lidocaine-prilocaine (EMLA) cream, Apply to affected area once daily prn (Patient taking differently: Apply 1 Application topically See admin instructions. Apply small amount topically to port area on the days of chemo, may also apply as needed for port area pain), Disp: 30 g, Rfl: 3   Nutritional Supplements (NUTRITIONAL SHAKE) LIQD, Take 1 Can by mouth 3 (three) times daily as needed (lack of appetite)., Disp: , Rfl:    ondansetron (ZOFRAN) 8 MG tablet, Take 1 tablet (8 mg total) by mouth every 8 (eight) hours as needed for nausea or vomiting. Start on the third day after carboplatin., Disp: 30 tablet, Rfl: 1   prochlorperazine (COMPAZINE) 10 MG tablet, Take 1 tablet (10 mg total) by mouth every 6 (six) hours as needed for nausea or vomiting., Disp: 30 tablet, Rfl: 1   doxylamine, Sleep, (UNISOM) 25 MG tablet, Take 25 mg by mouth at bedtime as needed for sleep., Disp: , Rfl:    polyethylene glycol (MIRALAX / GLYCOLAX) 17 g packet, Take 17 g by mouth 2 (two) times daily as needed for mild constipation. (Patient not taking: Reported on 03/06/2024), Disp: 14 each, Rfl: 0  Review of Symptoms: Complete 10-system review is negative except as above in Interval History.  Physical Exam: BP  126/81 (BP Location: Left Arm, Patient Position: Sitting)   Pulse 88   Temp 97.8 F (36.6 C) (Oral)   Resp 18   Wt 104 lb (47.2 kg)   SpO2 98%   BMI 19.65 kg/m  General: Alert, oriented, no acute distress. HEENT: Normocephalic, atraumatic. Neck symmetric without masses. Sclera anicteric.  Chest: Normal work of breathing. Clear to auscultation bilaterally.   Cardiovascular: Regular rate and rhythm, no murmurs. Abdomen: Soft, nontender.  Normoactive bowel sounds.  No masses appreciated.  Well-healing incisions. Extremities: Grossly normal range of motion.  Warm, well perfused.  No edema bilaterally. Skin: No rashes or lesions noted. GU: Normal appearing external genitalia without erythema, excoriation, or lesions.  Speculum exam reveals intact, well healing vaginal cuff.  Bimanual exam reveals intact vaginal cuff. Exam chaperoned by Kimberly [place], CMA   Laboratory & Radiologic Studies: Surgical pathology (02/20/24): A. DISTAL RECTAL BIOPSY: Peritoneum and fibroadipose tissue involved by carcinoma consistent with gynecologic primary.      See comment.  B. SIGMOID COLON BIOPSY:      Fibromuscular tissue with lymphohistiocytic inflammation.      Negative for malignancy.  C. UTERUS, CERVIX, BILATERAL FALLOPIAN TUBES AND OVARIES, ABDOMINAL HYSTERTCTOMY AND BILATERAL SALPINGO-OOPHORECTOMY:  Right ovary: Predominant necrosis with rare degenerated glandular architecture and calcifications consistent with treatment effect.           No residual viable carcinoma identified.           See oncology table.  Cervix:  Unremarkable.           Negative for dysplasia or malignancy.       Endocervix:           Nabothian cysts.           Negative for hyperplasia, atypia or malignancy.       Endometrium:           Benign inactive endometrium.           Negative for hyperplasia, atypia or malignancy.       Myometrium:           Unremarkable.           Negative for  malignancy.       Serosa:           Unremarkable.           Negative for malignancy.       Bilateral fallopian tubes:           Benign fimbriated fallopian tubes.           Negative for malignancy.       Left ovary:           Unremarkable.           Negative for malignancy.  D. OMENTUM:      Benign adipose tissue, negative for malignancy.  E. APPENDIX:      Benign vermiform appendix without significant diagnostic alteration.      Negative for malignancy.  ONCOLOGY TABLE:  OVARY or FALLOPIAN TUBE or PRIMARY PERITONEUM: Resection  Procedure: Total hysterectomy and bilateral salpingo-oophorectomy Specimen Integrity: Intact Tumor Site: Right ovary Tumor Size: 9.5 cm necrotic mass without viable carcinoma Histologic Type: Pre-treated diagnosis: high-grade serous carcinoma Histologic Grade: High-grade Ovarian Surface Involvement: NA Fallopian Tube Surface Involvement: Not identified Implants: Not applicable Lymphatic and/or Vascular Invasion:  Not identified Other Tissue/ Organ Involvement: Peritoneum of the pericolorectal soft tissue Largest Extrapelvic Peritoneal Focus: Not applicable Peritoneal/Ascitic Fluid Involvement: Identified ([id number]) Chemotherapy Response Score (CRS): CRS3 (marked response with no residual cancer) Regional Lymph Nodes: Not applicable (no lymph nodes submitted or found)      Number of Nodes with Metastasis Greater than 10 mm: NA      Number of Nodes with Metastasis 10 mm or Less (excludes isolated tumor cells): NA      Number of Nodes with Isolated Tumor Cells (0.2 mm or less): NA      Number of Lymph Nodes Examined: 0 Distant Metastasis:      Distant Site(s) Involved: None Pathologic Stage Classification (pTNM, AJCC 8th Edition): ypT2b, ypNx Ancillary Studies: Can be performed upon request Representative Tumor Block: A1 (Peritoneum of the pericolorectal soft tissue) Comment(s): See comment (v1.3.0.1)  COMMENT:  The right ovary  contains a mass lesion with predominant necrosis and rare degenerated glandular architecture with calcifications. The specimen is extensively sampled and there is no residual viable carcinoma identified in the right ovary. However, the distal rectal biopsy (part A) contains foci of crushed cells that are positive for CK AE1/AE3, PAX8, and WT-1 by immunohistochemical stains. P53 mutant type of staining (null). 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 The staging is rendered as T2b duo to peritoneal involvement in the rectum (pelvic extension below pelvic brim).

## 2024-03-11 NOTE — Patient Instructions (Signed)
 It was a pleasure to see you in clinic today. - You are healing well from surgery. No heavy lifting until 6 weeks after surgery - We will coordinate your surveillance after you finish chemotherapy.  Thank you very much for allowing me to provide care for you today.  I appreciate your confidence in choosing our Gynecologic Oncology team at [facility].  If you have any questions about your visit today please call our office or send [place] a MyChart message and we will get back to you as soon as possible.

## 2024-03-12 ENCOUNTER — Encounter: Payer: Self-pay | Admitting: Hematology and Oncology

## 2024-03-18 ENCOUNTER — Encounter: Payer: Self-pay | Admitting: Oncology

## 2024-03-18 NOTE — Assessment & Plan Note (Signed)
 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

## 2024-03-18 NOTE — Progress Notes (Signed)
 Faxed letter about restarting chemotherapy to [facility].

## 2024-03-19 DIAGNOSIS — H52201 Unspecified astigmatism, right eye: Secondary | ICD-10-CM | POA: Diagnosis not present

## 2024-03-19 DIAGNOSIS — H26491 Other secondary cataract, right eye: Secondary | ICD-10-CM | POA: Diagnosis not present

## 2024-03-19 DIAGNOSIS — Z961 Presence of intraocular lens: Secondary | ICD-10-CM | POA: Diagnosis not present

## 2024-03-19 DIAGNOSIS — H353132 Nonexudative age-related macular degeneration, bilateral, intermediate dry stage: Secondary | ICD-10-CM | POA: Diagnosis not present

## 2024-03-21 ENCOUNTER — Inpatient Hospital Stay: Attending: Psychiatry | Admitting: Hematology and Oncology

## 2024-03-21 ENCOUNTER — Encounter: Payer: Self-pay | Admitting: Hematology and Oncology

## 2024-03-21 ENCOUNTER — Inpatient Hospital Stay

## 2024-03-21 VITALS — BP 132/85 | HR 69 | Temp 98.3°F | Resp 18 | Ht 61.0 in | Wt 106.2 lb

## 2024-03-21 DIAGNOSIS — C561 Malignant neoplasm of right ovary: Secondary | ICD-10-CM | POA: Insufficient documentation

## 2024-03-21 DIAGNOSIS — Z5111 Encounter for antineoplastic chemotherapy: Secondary | ICD-10-CM | POA: Diagnosis not present

## 2024-03-21 DIAGNOSIS — R634 Abnormal weight loss: Secondary | ICD-10-CM | POA: Diagnosis not present

## 2024-03-21 DIAGNOSIS — Z1502 Genetic susceptibility to malignant neoplasm of ovary: Secondary | ICD-10-CM | POA: Diagnosis not present

## 2024-03-21 LAB — CBC WITH DIFFERENTIAL (CANCER CENTER ONLY)
Abs Immature Granulocytes: 0.03 10*3/uL (ref 0.00–0.07)
Basophils Absolute: 0 10*3/uL (ref 0.0–0.1)
Basophils Relative: 1 %
Eosinophils Absolute: 0.2 10*3/uL (ref 0.0–0.5)
Eosinophils Relative: 3 %
HCT: 43.4 % (ref 36.0–46.0)
Hemoglobin: 14.1 g/dL (ref 12.0–15.0)
Immature Granulocytes: 1 %
Lymphocytes Relative: 13 %
Lymphs Abs: 0.8 10*3/uL (ref 0.7–4.0)
MCH: 28.9 pg (ref 26.0–34.0)
MCHC: 32.5 g/dL (ref 30.0–36.0)
MCV: 88.9 fL (ref 80.0–100.0)
Monocytes Absolute: 0.8 10*3/uL (ref 0.1–1.0)
Monocytes Relative: 12 %
Neutro Abs: 4.6 10*3/uL (ref 1.7–7.7)
Neutrophils Relative %: 70 %
Platelet Count: 154 10*3/uL (ref 150–400)
RBC: 4.88 MIL/uL (ref 3.87–5.11)
RDW: 15.4 % (ref 11.5–15.5)
WBC Count: 6.5 10*3/uL (ref 4.0–10.5)
nRBC: 0 % (ref 0.0–0.2)

## 2024-03-21 LAB — CMP (CANCER CENTER ONLY)
ALT: 12 U/L (ref 0–44)
AST: 19 U/L (ref 15–41)
Albumin: 4.2 g/dL (ref 3.5–5.0)
Alkaline Phosphatase: 72 U/L (ref 38–126)
Anion gap: 6 (ref 5–15)
BUN: 18 mg/dL (ref 8–23)
CO2: 29 mmol/L (ref 22–32)
Calcium: 9.6 mg/dL (ref 8.9–10.3)
Chloride: 105 mmol/L (ref 98–111)
Creatinine: 0.51 mg/dL (ref 0.44–1.00)
GFR, Estimated: >60 mL/min (ref >60)
Glucose, Bld: 80 mg/dL (ref 70–99)
Potassium: 4.1 mmol/L (ref 3.5–5.1)
Sodium: 140 mmol/L (ref 135–145)
Total Bilirubin: 0.5 mg/dL (ref 0.0–1.2)
Total Protein: 6.7 g/dL (ref 6.5–8.1)

## 2024-03-21 MED ORDER — HEPARIN SOD (PORK) LOCK FLUSH 100 UNIT/ML IV SOLN
500.0000 [IU] | Freq: Once | INTRAVENOUS | Status: AC
Start: 2024-03-21 — End: 2024-03-21
  Administered 2024-03-21: 500 [IU]

## 2024-03-21 MED ORDER — SODIUM CHLORIDE 0.9% FLUSH
10.0000 mL | Freq: Once | INTRAVENOUS | Status: AC
  Administered 2024-03-21: 10 mL

## 2024-03-21 MED FILL — Fosaprepitant Dimeglumine For IV Infusion 150 MG (Base Eq): INTRAVENOUS | Qty: 5 | Status: AC

## 2024-03-21 NOTE — Assessment & Plan Note (Addendum)
 She had significant weight loss after surgery but is doing better with oral intake and is started to gain weight She will continue frequent small meals with additional oral nutritional supplements

## 2024-03-21 NOTE — Progress Notes (Signed)
 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    Artis Delay, MD  INTERVAL HISTORY: she returns for follow-up after recent surgery She is here accompanied by her son Her incisions are healing well She had lost weight after surgery but is slowly gaining back Her bowel habits are improving back to normal I reviewed surgical report and pathology report with the patient and her son and gave her copies We discussed risk and benefits of pursuing 3 additional cycles of chemotherapy in an adjuvant fashion and she is in agreement She has no residual side effects from prior treatment  PHYSICAL EXAMINATION: ECOG PERFORMANCE STATUS: 1 - Symptomatic but completely ambulatory  Vitals:   03/21/24 1328  BP: 132/85  Pulse: 69  Resp: 18  Temp: 98.3 F (36.8 C)  SpO2: 100%   Filed Weights   03/21/24 1328  Weight: 106 lb 3.2 oz (48.2 kg)    Relevant data reviewed during this visit included CBC, CMP, surgical report and pathology report from March  2025

## 2024-03-22 ENCOUNTER — Inpatient Hospital Stay

## 2024-03-22 ENCOUNTER — Other Ambulatory Visit: Payer: Self-pay

## 2024-03-22 VITALS — BP 144/78 | HR 74 | Temp 98.4°F | Resp 18

## 2024-03-22 DIAGNOSIS — C561 Malignant neoplasm of right ovary: Secondary | ICD-10-CM

## 2024-03-22 DIAGNOSIS — Z1502 Genetic susceptibility to malignant neoplasm of ovary: Secondary | ICD-10-CM | POA: Diagnosis not present

## 2024-03-22 DIAGNOSIS — R634 Abnormal weight loss: Secondary | ICD-10-CM | POA: Diagnosis not present

## 2024-03-22 DIAGNOSIS — Z5111 Encounter for antineoplastic chemotherapy: Secondary | ICD-10-CM | POA: Diagnosis not present

## 2024-03-22 LAB — CA 125: Cancer Antigen (CA) 125: 10.7 U/mL (ref 0.0–38.1)

## 2024-03-22 MED ORDER — PALONOSETRON HCL INJECTION 0.25 MG/5ML
0.2500 mg | Freq: Once | INTRAVENOUS | Status: AC
  Administered 2024-03-22: 0.25 mg via INTRAVENOUS
  Filled 2024-03-22: qty 5

## 2024-03-22 MED ORDER — SODIUM CHLORIDE 0.9 % IV SOLN
366.0000 mg | Freq: Once | INTRAVENOUS | Status: AC
Start: 2024-03-22 — End: 2024-03-22
  Administered 2024-03-22: 370 mg via INTRAVENOUS
  Filled 2024-03-22: qty 37

## 2024-03-22 MED ORDER — HEPARIN SOD (PORK) LOCK FLUSH 100 UNIT/ML IV SOLN
500.0000 [IU] | Freq: Once | INTRAVENOUS | Status: AC | PRN
  Administered 2024-03-22: 500 [IU]

## 2024-03-22 MED ORDER — FOSAPREPITANT DIMEGLUMINE INJECTION 150 MG
150.0000 mg | Freq: Once | INTRAVENOUS | Status: AC
  Administered 2024-03-22: 150 mg via INTRAVENOUS
  Filled 2024-03-22: qty 150

## 2024-03-22 MED ORDER — SODIUM CHLORIDE 0.9% FLUSH
10.0000 mL | INTRAVENOUS | Status: DC | PRN
  Administered 2024-03-22: 10 mL

## 2024-03-22 MED ORDER — CETIRIZINE HCL 10 MG/ML IV SOLN
10.0000 mg | Freq: Once | INTRAVENOUS | Status: AC
  Administered 2024-03-22: 10 mg via INTRAVENOUS
  Filled 2024-03-22: qty 1

## 2024-03-22 MED ORDER — SODIUM CHLORIDE 0.9 % IV SOLN: INTRAVENOUS | Status: DC

## 2024-03-22 MED ORDER — SODIUM CHLORIDE 0.9 % IV SOLN
175.0000 mg/m2 | Freq: Once | INTRAVENOUS | Status: AC
  Administered 2024-03-22: 258 mg via INTRAVENOUS
  Filled 2024-03-22: qty 43

## 2024-03-22 MED ORDER — FAMOTIDINE IN NACL 20-0.9 MG/50ML-% IV SOLN
20.0000 mg | Freq: Once | INTRAVENOUS | Status: AC
  Administered 2024-03-22: 20 mg via INTRAVENOUS
  Filled 2024-03-22: qty 50

## 2024-03-22 MED ORDER — DEXAMETHASONE SODIUM PHOSPHATE 10 MG/ML IJ SOLN
10.0000 mg | Freq: Once | INTRAMUSCULAR | Status: AC
  Administered 2024-03-22: 10 mg via INTRAVENOUS
  Filled 2024-03-22: qty 1

## 2024-03-22 NOTE — Patient Instructions (Signed)
 [facility] - A DEPT OF [facility]  Discharge Instructions: Thank you for choosing [facility] to provide your oncology and hematology care.   If you have a lab appointment with the Cancer Center, please go directly to the Cancer Center and check in at the registration area.   Wear comfortable clothing and clothing appropriate for easy access to any Portacath or PICC line.   We strive to give you quality time with your provider. You may need to reschedule your appointment if you arrive late (15 or more minutes).  Arriving late affects you and other patients whose appointments are after yours.  Also, if you miss three or more appointments without notifying the office, you may be dismissed from the clinic at the provider's discretion.      For prescription refill requests, have your pharmacy contact our office and allow 72 hours for refills to be completed.    Today you received the following chemotherapy and/or immunotherapy agents paclitaxel, carboplatin      To help prevent nausea and vomiting after your treatment, we encourage you to take your nausea medication as directed.  BELOW ARE SYMPTOMS THAT SHOULD BE REPORTED IMMEDIATELY: *FEVER GREATER THAN 100.4 F (38 C) OR HIGHER *CHILLS OR SWEATING *NAUSEA AND VOMITING THAT IS NOT CONTROLLED WITH YOUR NAUSEA MEDICATION *UNUSUAL SHORTNESS OF BREATH *UNUSUAL BRUISING OR BLEEDING *URINARY PROBLEMS (pain or burning when urinating, or frequent urination) *BOWEL PROBLEMS (unusual diarrhea, constipation, pain near the anus) TENDERNESS IN MOUTH AND THROAT WITH OR WITHOUT PRESENCE OF ULCERS (sore throat, sores in mouth, or a toothache) UNUSUAL RASH, SWELLING OR PAIN  UNUSUAL VAGINAL DISCHARGE OR ITCHING   Items with * indicate a potential emergency and should be followed up as soon as possible or go to the Emergency Department if any problems should occur.  Please show the CHEMOTHERAPY ALERT CARD or  IMMUNOTHERAPY ALERT CARD at check-in to the Emergency Department and triage nurse.  Should you have questions after your visit or need to cancel or reschedule your appointment, please contact [facility] - A DEPT OF [facility]  Dept: [phone]  and follow the prompts.  Office hours are 8:00 a.m. to 4:30 p.m. Monday - Friday. Please note that voicemails left after 4:00 p.m. may not be returned until the following business day.  We are closed weekends and major holidays. You have access to a nurse at all times for urgent questions. Please call the main number to the clinic Dept: [phone] and follow the prompts.   For any non-urgent questions, you may also contact your provider using MyChart. We now offer e-Visits for anyone 44 and older to request care online for non-urgent symptoms. For details visit [email or web address].   Also download the MyChart app! Go to the app store, search "MyChart", open the app, select [facility], and log in with your MyChart username and password.

## 2024-03-25 DIAGNOSIS — H43813 Vitreous degeneration, bilateral: Secondary | ICD-10-CM | POA: Diagnosis not present

## 2024-03-25 DIAGNOSIS — H353112 Nonexudative age-related macular degeneration, right eye, intermediate dry stage: Secondary | ICD-10-CM | POA: Diagnosis not present

## 2024-03-25 DIAGNOSIS — H5319 Other subjective visual disturbances: Secondary | ICD-10-CM | POA: Diagnosis not present

## 2024-03-25 DIAGNOSIS — H353221 Exudative age-related macular degeneration, left eye, with active choroidal neovascularization: Secondary | ICD-10-CM | POA: Diagnosis not present

## 2024-03-25 DIAGNOSIS — H35033 Hypertensive retinopathy, bilateral: Secondary | ICD-10-CM | POA: Diagnosis not present

## 2024-03-26 ENCOUNTER — Encounter: Payer: Self-pay | Admitting: Psychiatry

## 2024-03-26 ENCOUNTER — Encounter: Payer: Self-pay | Admitting: Hematology and Oncology

## 2024-03-27 ENCOUNTER — Telehealth: Payer: Self-pay | Admitting: *Deleted

## 2024-03-27 NOTE — Telephone Encounter (Signed)
 Patient left VM-having problems with bowels again, but this time is different. It's the beginning of the fourth day. She's oozing, but its not the usual BM. She's had to put on "the big bandage thing".  She started Miralax today.   TCT patient in response to above message -- LVM on named VM:  Advised her to continue Miralax daily. Advised her that per Dr. Camilla Cedar note in January, it was recommended she take twice daily MiraLAX and Senokot for 3 days after treatment. Asked her to call office in the morning to provide update.

## 2024-04-01 DIAGNOSIS — D509 Iron deficiency anemia, unspecified: Secondary | ICD-10-CM | POA: Diagnosis not present

## 2024-04-01 DIAGNOSIS — F5101 Primary insomnia: Secondary | ICD-10-CM | POA: Diagnosis not present

## 2024-04-01 DIAGNOSIS — E039 Hypothyroidism, unspecified: Secondary | ICD-10-CM | POA: Diagnosis not present

## 2024-04-03 DIAGNOSIS — E782 Mixed hyperlipidemia: Secondary | ICD-10-CM | POA: Diagnosis not present

## 2024-04-03 DIAGNOSIS — E039 Hypothyroidism, unspecified: Secondary | ICD-10-CM | POA: Diagnosis not present

## 2024-04-10 ENCOUNTER — Telehealth: Payer: Self-pay

## 2024-04-10 NOTE — Telephone Encounter (Signed)
 Pt called with concern for dignicap. She states with her last treatment, she didn't feel the dignicap was secure and she noticed a significant hair loss after tx. She wants to make sure the nurses Friday will be able to do it correctly. Advised pt the nurses on for Friday morning are very familiar with dignicap and will help her to ensure she gets the best experience with it. She verbalized thanks and understanding.

## 2024-04-11 ENCOUNTER — Inpatient Hospital Stay

## 2024-04-11 ENCOUNTER — Encounter: Payer: Self-pay | Admitting: Hematology and Oncology

## 2024-04-11 ENCOUNTER — Inpatient Hospital Stay: Attending: Psychiatry | Admitting: Hematology and Oncology

## 2024-04-11 VITALS — BP 155/83 | HR 87 | Temp 98.3°F | Resp 18 | Ht 61.0 in | Wt 108.0 lb

## 2024-04-11 DIAGNOSIS — C561 Malignant neoplasm of right ovary: Secondary | ICD-10-CM

## 2024-04-11 DIAGNOSIS — Z1502 Genetic susceptibility to malignant neoplasm of ovary: Secondary | ICD-10-CM | POA: Diagnosis not present

## 2024-04-11 DIAGNOSIS — K5909 Other constipation: Secondary | ICD-10-CM | POA: Diagnosis not present

## 2024-04-11 DIAGNOSIS — Z5111 Encounter for antineoplastic chemotherapy: Secondary | ICD-10-CM | POA: Insufficient documentation

## 2024-04-11 DIAGNOSIS — R42 Dizziness and giddiness: Secondary | ICD-10-CM | POA: Insufficient documentation

## 2024-04-11 LAB — CMP (CANCER CENTER ONLY)
ALT: 13 U/L (ref 0–44)
AST: 18 U/L (ref 15–41)
Albumin: 4.1 g/dL (ref 3.5–5.0)
Alkaline Phosphatase: 83 U/L (ref 38–126)
Anion gap: 4 — ABNORMAL LOW (ref 5–15)
BUN: 18 mg/dL (ref 8–23)
CO2: 31 mmol/L (ref 22–32)
Calcium: 9.4 mg/dL (ref 8.9–10.3)
Chloride: 105 mmol/L (ref 98–111)
Creatinine: 0.55 mg/dL (ref 0.44–1.00)
GFR, Estimated: >60 mL/min (ref >60)
Glucose, Bld: 149 mg/dL — ABNORMAL HIGH (ref 70–99)
Potassium: 3.9 mmol/L (ref 3.5–5.1)
Sodium: 140 mmol/L (ref 135–145)
Total Bilirubin: 0.3 mg/dL (ref 0.0–1.2)
Total Protein: 6.7 g/dL (ref 6.5–8.1)

## 2024-04-11 LAB — CBC WITH DIFFERENTIAL (CANCER CENTER ONLY)
Abs Immature Granulocytes: 0.06 10*3/uL (ref 0.00–0.07)
Basophils Absolute: 0 10*3/uL (ref 0.0–0.1)
Basophils Relative: 1 %
Eosinophils Absolute: 0 10*3/uL (ref 0.0–0.5)
Eosinophils Relative: 1 %
HCT: 42.9 % (ref 36.0–46.0)
Hemoglobin: 14.4 g/dL (ref 12.0–15.0)
Immature Granulocytes: 1 %
Lymphocytes Relative: 24 %
Lymphs Abs: 1 10*3/uL (ref 0.7–4.0)
MCH: 29.5 pg (ref 26.0–34.0)
MCHC: 33.6 g/dL (ref 30.0–36.0)
MCV: 87.9 fL (ref 80.0–100.0)
Monocytes Absolute: 0.7 10*3/uL (ref 0.1–1.0)
Monocytes Relative: 17 %
Neutro Abs: 2.5 10*3/uL (ref 1.7–7.7)
Neutrophils Relative %: 56 %
Platelet Count: 155 10*3/uL (ref 150–400)
RBC: 4.88 MIL/uL (ref 3.87–5.11)
RDW: 15 % (ref 11.5–15.5)
WBC Count: 4.4 10*3/uL (ref 4.0–10.5)
nRBC: 0 % (ref 0.0–0.2)

## 2024-04-11 MED ORDER — SODIUM CHLORIDE 0.9% FLUSH
10.0000 mL | Freq: Once | INTRAVENOUS | Status: AC
  Administered 2024-04-11: 10 mL

## 2024-04-11 MED ORDER — HEPARIN SOD (PORK) LOCK FLUSH 100 UNIT/ML IV SOLN
250.0000 [IU] | Freq: Once | INTRAVENOUS | Status: AC
  Administered 2024-04-11: 250 [IU]

## 2024-04-11 NOTE — Progress Notes (Signed)
 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   She has no residual side effects from prior treatment except for mild hair loss and constipation Her recent sensation of dizziness could be due to dehydration and poor oral fluid intake We will proceed with chemotherapy tomorrow as scheduled She will complete last cycle of treatment end of this month Plan to repeat imaging study in June Other constipation She had history of severe constipation with treatment We discussed twice daily MiraLAX  and Senokot for 3 days after treatment Dizziness She has mild intermittent dizziness Her blood pressure today is not low On review of her medication list and her oral intake, it is possible that her dizziness could be due to poor oral fluid intake We discussed importance of adequate hydration  No orders of the defined types were placed in this encounter.    Almeda Jacobs, MD  INTERVAL HISTORY: she returns for treatment follow-up with her son Complications related to previous cycle of chemotherapy included constipation, and occasional dizziness She is still taking laxatives to prevent constipation after chemo Denies nausea No peripheral neuropathy She has occasional intermittent dizziness She admits she is not drinking enough oral fluid  PHYSICAL EXAMINATION: ECOG PERFORMANCE STATUS: 1 - Symptomatic but completely ambulatory  Vitals:   04/11/24 1332  BP: (!) 155/83  Pulse: 87  Resp: 18  Temp: 98.3 F (36.8 C)  SpO2: 100%   Filed Weights   04/11/24  1332  Weight: 108 lb (49 kg)    Relevant data reviewed during this visit included CBC and CMP

## 2024-04-11 NOTE — Assessment & Plan Note (Addendum)
 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

## 2024-04-11 NOTE — Assessment & Plan Note (Addendum)
 She has mild intermittent dizziness Her blood pressure today is not low On review of her medication list and her oral intake, it is possible that her dizziness could be due to poor oral fluid intake We discussed importance of adequate hydration

## 2024-04-11 NOTE — Assessment & Plan Note (Addendum)
 She had history of severe constipation with treatment We discussed twice daily MiraLAX and Senokot for 3 days after treatment

## 2024-04-12 ENCOUNTER — Inpatient Hospital Stay

## 2024-04-12 VITALS — BP 144/93 | HR 68 | Temp 97.9°F | Resp 18

## 2024-04-12 DIAGNOSIS — Z5111 Encounter for antineoplastic chemotherapy: Secondary | ICD-10-CM | POA: Diagnosis not present

## 2024-04-12 DIAGNOSIS — R42 Dizziness and giddiness: Secondary | ICD-10-CM | POA: Diagnosis not present

## 2024-04-12 DIAGNOSIS — C561 Malignant neoplasm of right ovary: Secondary | ICD-10-CM | POA: Diagnosis not present

## 2024-04-12 DIAGNOSIS — Z1502 Genetic susceptibility to malignant neoplasm of ovary: Secondary | ICD-10-CM | POA: Diagnosis not present

## 2024-04-12 MED ORDER — SODIUM CHLORIDE 0.9 % IV SOLN
150.0000 mg | Freq: Once | INTRAVENOUS | Status: AC
  Administered 2024-04-12: 150 mg via INTRAVENOUS
  Filled 2024-04-12: qty 150

## 2024-04-12 MED ORDER — SODIUM CHLORIDE 0.9 % IV SOLN
INTRAVENOUS | Status: DC
Start: 2024-04-12 — End: 2024-04-12

## 2024-04-12 MED ORDER — SODIUM CHLORIDE 0.9% FLUSH: 10.0000 mL | INTRAVENOUS | Status: DC | PRN

## 2024-04-12 MED ORDER — SODIUM CHLORIDE 0.9 % IV SOLN
366.0000 mg | Freq: Once | INTRAVENOUS | Status: AC
  Administered 2024-04-12: 370 mg via INTRAVENOUS
  Filled 2024-04-12: qty 37

## 2024-04-12 MED ORDER — HEPARIN SOD (PORK) LOCK FLUSH 100 UNIT/ML IV SOLN: 500.0000 [IU] | Freq: Once | INTRAVENOUS | Status: DC | PRN

## 2024-04-12 MED ORDER — PALONOSETRON HCL INJECTION 0.25 MG/5ML
0.2500 mg | Freq: Once | INTRAVENOUS | Status: AC
  Administered 2024-04-12: 0.25 mg via INTRAVENOUS
  Filled 2024-04-12: qty 5

## 2024-04-12 MED ORDER — DEXAMETHASONE SODIUM PHOSPHATE 10 MG/ML IJ SOLN
10.0000 mg | Freq: Once | INTRAMUSCULAR | Status: AC
  Administered 2024-04-12: 10 mg via INTRAVENOUS
  Filled 2024-04-12: qty 1

## 2024-04-12 MED ORDER — CETIRIZINE HCL 10 MG/ML IV SOLN
10.0000 mg | Freq: Once | INTRAVENOUS | Status: AC
  Administered 2024-04-12: 10 mg via INTRAVENOUS
  Filled 2024-04-12: qty 1

## 2024-04-12 MED ORDER — SODIUM CHLORIDE 0.9 % IV SOLN
175.0000 mg/m2 | Freq: Once | INTRAVENOUS | Status: AC
  Administered 2024-04-12: 258 mg via INTRAVENOUS
  Filled 2024-04-12: qty 43

## 2024-04-12 MED ORDER — FAMOTIDINE IN NACL 20-0.9 MG/50ML-% IV SOLN
20.0000 mg | Freq: Once | INTRAVENOUS | Status: AC
  Administered 2024-04-12: 20 mg via INTRAVENOUS
  Filled 2024-04-12: qty 50

## 2024-04-12 NOTE — Patient Instructions (Signed)
 [facility] - A DEPT OF [facility]  Discharge Instructions: Thank you for choosing [facility] to provide your oncology and hematology care.   If you have a lab appointment with the Cancer Center, please go directly to the Cancer Center and check in at the registration area.   Wear comfortable clothing and clothing appropriate for easy access to any Portacath or PICC line.   We strive to give you quality time with your provider. You may need to reschedule your appointment if you arrive late (15 or more minutes).  Arriving late affects you and other patients whose appointments are after yours.  Also, if you miss three or more appointments without notifying the office, you may be dismissed from the clinic at the provider's discretion.      For prescription refill requests, have your pharmacy contact our office and allow 72 hours for refills to be completed.    Today you received the following chemotherapy and/or immunotherapy agents carboplatin  and paclitaxel       To help prevent nausea and vomiting after your treatment, we encourage you to take your nausea medication as directed.  BELOW ARE SYMPTOMS THAT SHOULD BE REPORTED IMMEDIATELY: *FEVER GREATER THAN 100.4 F (38 C) OR HIGHER *CHILLS OR SWEATING *NAUSEA AND VOMITING THAT IS NOT CONTROLLED WITH YOUR NAUSEA MEDICATION *UNUSUAL SHORTNESS OF BREATH *UNUSUAL BRUISING OR BLEEDING *URINARY PROBLEMS (pain or burning when urinating, or frequent urination) *BOWEL PROBLEMS (unusual diarrhea, constipation, pain near the anus) TENDERNESS IN MOUTH AND THROAT WITH OR WITHOUT PRESENCE OF ULCERS (sore throat, sores in mouth, or a toothache) UNUSUAL RASH, SWELLING OR PAIN  UNUSUAL VAGINAL DISCHARGE OR ITCHING   Items with * indicate a potential emergency and should be followed up as soon as possible or go to the Emergency Department if any problems should occur.  Please show the CHEMOTHERAPY ALERT CARD  or IMMUNOTHERAPY ALERT CARD at check-in to the Emergency Department and triage nurse.  Should you have questions after your visit or need to cancel or reschedule your appointment, please contact [facility] - A DEPT OF [facility]  Dept: [phone]  and follow the prompts.  Office hours are 8:00 a.m. to 4:30 p.m. Monday - Friday. Please note that voicemails left after 4:00 p.m. may not be returned until the following business day.  We are closed weekends and major holidays. You have access to a nurse at all times for urgent questions. Please call the main number to the clinic Dept: [phone] and follow the prompts.   For any non-urgent questions, you may also contact your provider using MyChart. We now offer e-Visits for anyone 37 and older to request care online for non-urgent symptoms. For details visit [email or web address].   Also download the MyChart app! Go to the app store, search "MyChart", open the app, select , and log in with your MyChart username and password.

## 2024-04-18 ENCOUNTER — Encounter: Payer: Self-pay | Admitting: Hematology and Oncology

## 2024-04-29 DIAGNOSIS — E039 Hypothyroidism, unspecified: Secondary | ICD-10-CM | POA: Diagnosis not present

## 2024-04-29 DIAGNOSIS — C561 Malignant neoplasm of right ovary: Secondary | ICD-10-CM | POA: Diagnosis not present

## 2024-04-29 DIAGNOSIS — E782 Mixed hyperlipidemia: Secondary | ICD-10-CM | POA: Diagnosis not present

## 2024-04-30 ENCOUNTER — Telehealth: Payer: Self-pay

## 2024-04-30 NOTE — Telephone Encounter (Signed)
 Returned her call and reviewed upcoming appts. She is aware of appts.

## 2024-05-01 DIAGNOSIS — K5903 Drug induced constipation: Secondary | ICD-10-CM | POA: Diagnosis not present

## 2024-05-01 DIAGNOSIS — T50905A Adverse effect of unspecified drugs, medicaments and biological substances, initial encounter: Secondary | ICD-10-CM | POA: Diagnosis not present

## 2024-05-01 DIAGNOSIS — E782 Mixed hyperlipidemia: Secondary | ICD-10-CM | POA: Diagnosis not present

## 2024-05-01 DIAGNOSIS — E441 Mild protein-calorie malnutrition: Secondary | ICD-10-CM | POA: Diagnosis not present

## 2024-05-01 DIAGNOSIS — E785 Hyperlipidemia, unspecified: Secondary | ICD-10-CM | POA: Diagnosis not present

## 2024-05-01 DIAGNOSIS — G3184 Mild cognitive impairment, so stated: Secondary | ICD-10-CM | POA: Diagnosis not present

## 2024-05-01 DIAGNOSIS — E038 Other specified hypothyroidism: Secondary | ICD-10-CM | POA: Diagnosis not present

## 2024-05-01 DIAGNOSIS — C569 Malignant neoplasm of unspecified ovary: Secondary | ICD-10-CM | POA: Diagnosis not present

## 2024-05-01 DIAGNOSIS — H353131 Nonexudative age-related macular degeneration, bilateral, early dry stage: Secondary | ICD-10-CM | POA: Diagnosis not present

## 2024-05-01 DIAGNOSIS — D508 Other iron deficiency anemias: Secondary | ICD-10-CM | POA: Diagnosis not present

## 2024-05-01 NOTE — Progress Notes (Signed)
 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   She has no residual side effects from prior treatment except for mild hair loss and constipation She tolerated last cycle therapy well without major problems and able to maintain her weight  Plan to repeat imaging study in June We discussed future follow-up and whether she should maintain her port in the future  Orders Placed This Encounter  Procedures   CT ABDOMEN PELVIS W CONTRAST    Standing Status:   Future    Expected Date:   06/03/2024    Expiration Date:   05/02/2025    Scheduling Instructions:     No need oral contrast    If indicated for the ordered procedure, I authorize the administration of contrast media per Radiology protocol:   Yes    Does the patient have a contrast media/X-ray dye allergy?:   No    Preferred imaging location?:   [facility] [place] [facility]    If indicated for the ordered procedure, I authorize the administration of oral contrast media per Radiology protocol:   No    Reason for no oral contrast::   No need oral contrast     Almeda Jacobs, MD  INTERVAL HISTORY: she returns for treatment follow-up Complications related to previous cycle of chemotherapy included none She is here accompanied by her son She denies nausea or changes in her appetite No constipation No peripheral neuropathy  PHYSICAL EXAMINATION: ECOG PERFORMANCE STATUS: 0 - Asymptomatic  Lab Results  Component Value Date   CAN125 10.7 03/21/2024   CAN125 60.6 (H) 01/25/2024   CAN125 130.0 (H) 01/04/2024    Vitals:   05/02/24 1100  BP: 132/85  Pulse: 83  Resp: 18  Temp: 98 F (36.7 C)  SpO2: 97%   There were no vitals filed for this visit.  Relevant data reviewed during this visit included CBC, CMP, CA125

## 2024-05-02 ENCOUNTER — Inpatient Hospital Stay (HOSPITAL_BASED_OUTPATIENT_CLINIC_OR_DEPARTMENT_OTHER): Admitting: Hematology and Oncology

## 2024-05-02 ENCOUNTER — Encounter: Payer: Self-pay | Admitting: Hematology and Oncology

## 2024-05-02 ENCOUNTER — Inpatient Hospital Stay

## 2024-05-02 VITALS — BP 132/85 | HR 83 | Temp 98.0°F | Resp 18 | Ht 61.0 in

## 2024-05-02 DIAGNOSIS — Z5111 Encounter for antineoplastic chemotherapy: Secondary | ICD-10-CM | POA: Diagnosis not present

## 2024-05-02 DIAGNOSIS — R42 Dizziness and giddiness: Secondary | ICD-10-CM | POA: Diagnosis not present

## 2024-05-02 DIAGNOSIS — C561 Malignant neoplasm of right ovary: Secondary | ICD-10-CM | POA: Diagnosis not present

## 2024-05-02 DIAGNOSIS — Z1502 Genetic susceptibility to malignant neoplasm of ovary: Secondary | ICD-10-CM | POA: Diagnosis not present

## 2024-05-02 LAB — CBC WITH DIFFERENTIAL (CANCER CENTER ONLY)
Abs Immature Granulocytes: 0.05 10*3/uL (ref 0.00–0.07)
Basophils Absolute: 0 10*3/uL (ref 0.0–0.1)
Basophils Relative: 1 %
Eosinophils Absolute: 0 10*3/uL (ref 0.0–0.5)
Eosinophils Relative: 0 %
HCT: 41.9 % (ref 36.0–46.0)
Hemoglobin: 13.8 g/dL (ref 12.0–15.0)
Immature Granulocytes: 1 %
Lymphocytes Relative: 14 %
Lymphs Abs: 1 10*3/uL (ref 0.7–4.0)
MCH: 29.4 pg (ref 26.0–34.0)
MCHC: 32.9 g/dL (ref 30.0–36.0)
MCV: 89.1 fL (ref 80.0–100.0)
Monocytes Absolute: 0.6 10*3/uL (ref 0.1–1.0)
Monocytes Relative: 9 %
Neutro Abs: 5.1 10*3/uL (ref 1.7–7.7)
Neutrophils Relative %: 75 %
Platelet Count: 178 10*3/uL (ref 150–400)
RBC: 4.7 MIL/uL (ref 3.87–5.11)
RDW: 15 % (ref 11.5–15.5)
WBC Count: 6.9 10*3/uL (ref 4.0–10.5)
nRBC: 0 % (ref 0.0–0.2)

## 2024-05-02 LAB — CMP (CANCER CENTER ONLY)
ALT: 15 U/L (ref 0–44)
AST: 22 U/L (ref 15–41)
Albumin: 4.1 g/dL (ref 3.5–5.0)
Alkaline Phosphatase: 80 U/L (ref 38–126)
Anion gap: 6 (ref 5–15)
BUN: 14 mg/dL (ref 8–23)
CO2: 29 mmol/L (ref 22–32)
Calcium: 9.4 mg/dL (ref 8.9–10.3)
Chloride: 105 mmol/L (ref 98–111)
Creatinine: 0.55 mg/dL (ref 0.44–1.00)
GFR, Estimated: >60 mL/min (ref >60)
Glucose, Bld: 148 mg/dL — ABNORMAL HIGH (ref 70–99)
Potassium: 4.1 mmol/L (ref 3.5–5.1)
Sodium: 140 mmol/L (ref 135–145)
Total Bilirubin: 0.4 mg/dL (ref 0.0–1.2)
Total Protein: 6.7 g/dL (ref 6.5–8.1)

## 2024-05-02 MED ORDER — SODIUM CHLORIDE 0.9% FLUSH
10.0000 mL | Freq: Once | INTRAVENOUS | Status: AC
  Administered 2024-05-02: 10 mL

## 2024-05-02 NOTE — Assessment & Plan Note (Addendum)
 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

## 2024-05-03 ENCOUNTER — Other Ambulatory Visit: Payer: Self-pay

## 2024-05-03 ENCOUNTER — Inpatient Hospital Stay

## 2024-05-03 VITALS — BP 133/88 | HR 80 | Temp 97.8°F | Resp 16 | Ht 61.0 in | Wt 107.5 lb

## 2024-05-03 DIAGNOSIS — Z5111 Encounter for antineoplastic chemotherapy: Secondary | ICD-10-CM | POA: Diagnosis not present

## 2024-05-03 DIAGNOSIS — R42 Dizziness and giddiness: Secondary | ICD-10-CM | POA: Diagnosis not present

## 2024-05-03 DIAGNOSIS — Z1502 Genetic susceptibility to malignant neoplasm of ovary: Secondary | ICD-10-CM | POA: Diagnosis not present

## 2024-05-03 DIAGNOSIS — C561 Malignant neoplasm of right ovary: Secondary | ICD-10-CM | POA: Diagnosis not present

## 2024-05-03 LAB — CA 125: Cancer Antigen (CA) 125: 9.2 U/mL (ref 0.0–38.1)

## 2024-05-03 MED ORDER — SODIUM CHLORIDE 0.9 % IV SOLN
366.0000 mg | Freq: Once | INTRAVENOUS | Status: AC
  Administered 2024-05-03: 370 mg via INTRAVENOUS
  Filled 2024-05-03: qty 37

## 2024-05-03 MED ORDER — PALONOSETRON HCL INJECTION 0.25 MG/5ML
0.2500 mg | Freq: Once | INTRAVENOUS | Status: AC
  Administered 2024-05-03: 0.25 mg via INTRAVENOUS
  Filled 2024-05-03: qty 5

## 2024-05-03 MED ORDER — FAMOTIDINE IN NACL 20-0.9 MG/50ML-% IV SOLN
20.0000 mg | Freq: Once | INTRAVENOUS | Status: AC
  Administered 2024-05-03: 20 mg via INTRAVENOUS
  Filled 2024-05-03: qty 50

## 2024-05-03 MED ORDER — FOSAPREPITANT DIMEGLUMINE INJECTION 150 MG
150.0000 mg | Freq: Once | INTRAVENOUS | Status: DC
  Filled 2024-05-03: qty 5

## 2024-05-03 MED ORDER — CETIRIZINE HCL 10 MG/ML IV SOLN
10.0000 mg | Freq: Once | INTRAVENOUS | Status: AC
  Administered 2024-05-03: 10 mg via INTRAVENOUS
  Filled 2024-05-03: qty 1

## 2024-05-03 MED ORDER — SODIUM CHLORIDE 0.9 % IV SOLN
150.0000 mg | Freq: Once | INTRAVENOUS | Status: AC
  Administered 2024-05-03: 150 mg via INTRAVENOUS
  Filled 2024-05-03: qty 150

## 2024-05-03 MED ORDER — DEXAMETHASONE SODIUM PHOSPHATE 10 MG/ML IJ SOLN
10.0000 mg | Freq: Once | INTRAMUSCULAR | Status: AC
  Administered 2024-05-03: 10 mg via INTRAVENOUS
  Filled 2024-05-03: qty 1

## 2024-05-03 MED ORDER — SODIUM CHLORIDE 0.9 % IV SOLN: INTRAVENOUS | Status: DC

## 2024-05-03 MED ORDER — SODIUM CHLORIDE 0.9 % IV SOLN
175.0000 mg/m2 | Freq: Once | INTRAVENOUS | Status: AC
  Administered 2024-05-03: 258 mg via INTRAVENOUS
  Filled 2024-05-03: qty 43

## 2024-05-03 NOTE — Patient Instructions (Signed)
 [facility] - A DEPT OF [facility]  Discharge Instructions: Thank you for choosing [facility] to provide your oncology and hematology care.   If you have a lab appointment with the Cancer Center, please go directly to the Cancer Center and check in at the registration area.   Wear comfortable clothing and clothing appropriate for easy access to any Portacath or PICC line.   We strive to give you quality time with your provider. You may need to reschedule your appointment if you arrive late (15 or more minutes).  Arriving late affects you and other patients whose appointments are after yours.  Also, if you miss three or more appointments without notifying the office, you may be dismissed from the clinic at the provider's discretion.      For prescription refill requests, have your pharmacy contact our office and allow 72 hours for refills to be completed.    Today you received the following chemotherapy and/or immunotherapy agents:   Paclitaxel, Carboplatin    To help prevent nausea and vomiting after your treatment, we encourage you to take your nausea medication as directed.  BELOW ARE SYMPTOMS THAT SHOULD BE REPORTED IMMEDIATELY: *FEVER GREATER THAN 100.4 F (38 C) OR HIGHER *CHILLS OR SWEATING *NAUSEA AND VOMITING THAT IS NOT CONTROLLED WITH YOUR NAUSEA MEDICATION *UNUSUAL SHORTNESS OF BREATH *UNUSUAL BRUISING OR BLEEDING *URINARY PROBLEMS (pain or burning when urinating, or frequent urination) *BOWEL PROBLEMS (unusual diarrhea, constipation, pain near the anus) TENDERNESS IN MOUTH AND THROAT WITH OR WITHOUT PRESENCE OF ULCERS (sore throat, sores in mouth, or a toothache) UNUSUAL RASH, SWELLING OR PAIN  UNUSUAL VAGINAL DISCHARGE OR ITCHING   Items with * indicate a potential emergency and should be followed up as soon as possible or go to the Emergency Department if any problems should occur.  Please show the CHEMOTHERAPY ALERT CARD or  IMMUNOTHERAPY ALERT CARD at check-in to the Emergency Department and triage nurse.  Should you have questions after your visit or need to cancel or reschedule your appointment, please contact [facility] - A DEPT OF [facility]  Dept: [phone]  and follow the prompts.  Office hours are 8:00 a.m. to 4:30 p.m. Monday - Friday. Please note that voicemails left after 4:00 p.m. may not be returned until the following business day.  We are closed weekends and major holidays. You have access to a nurse at all times for urgent questions. Please call the main number to the clinic Dept: [phone] and follow the prompts.   For any non-urgent questions, you may also contact your provider using MyChart. We now offer e-Visits for anyone 63 and older to request care online for non-urgent symptoms. For details visit [email or web address].   Also download the MyChart app! Go to the app store, search "MyChart", open the app, select [place], and log in with your MyChart username and password.

## 2024-05-27 DIAGNOSIS — E039 Hypothyroidism, unspecified: Secondary | ICD-10-CM | POA: Diagnosis not present

## 2024-05-27 DIAGNOSIS — R300 Dysuria: Secondary | ICD-10-CM | POA: Diagnosis not present

## 2024-05-27 DIAGNOSIS — E785 Hyperlipidemia, unspecified: Secondary | ICD-10-CM | POA: Diagnosis not present

## 2024-05-30 DIAGNOSIS — E785 Hyperlipidemia, unspecified: Secondary | ICD-10-CM | POA: Diagnosis not present

## 2024-05-30 DIAGNOSIS — E039 Hypothyroidism, unspecified: Secondary | ICD-10-CM | POA: Diagnosis not present

## 2024-06-03 ENCOUNTER — Ambulatory Visit (HOSPITAL_COMMUNITY)
Admission: RE | Admit: 2024-06-03 | Discharge: 2024-06-03 | Disposition: A | Discharge status: Home / Self Care | Source: Ambulatory Visit | Attending: Hematology and Oncology | Admitting: Hematology and Oncology

## 2024-06-03 ENCOUNTER — Inpatient Hospital Stay: Attending: Psychiatry

## 2024-06-03 DIAGNOSIS — C561 Malignant neoplasm of right ovary: Secondary | ICD-10-CM | POA: Insufficient documentation

## 2024-06-03 DIAGNOSIS — K5730 Diverticulosis of large intestine without perforation or abscess without bleeding: Secondary | ICD-10-CM | POA: Insufficient documentation

## 2024-06-03 DIAGNOSIS — Z8543 Personal history of malignant neoplasm of ovary: Secondary | ICD-10-CM | POA: Diagnosis not present

## 2024-06-03 DIAGNOSIS — K7689 Other specified diseases of liver: Secondary | ICD-10-CM | POA: Diagnosis not present

## 2024-06-03 DIAGNOSIS — N3289 Other specified disorders of bladder: Secondary | ICD-10-CM | POA: Diagnosis not present

## 2024-06-03 DIAGNOSIS — C569 Malignant neoplasm of unspecified ovary: Secondary | ICD-10-CM | POA: Diagnosis not present

## 2024-06-03 DIAGNOSIS — Z9221 Personal history of antineoplastic chemotherapy: Secondary | ICD-10-CM | POA: Diagnosis not present

## 2024-06-03 LAB — COMPREHENSIVE METABOLIC PANEL WITH GFR
ALT: 14 U/L (ref 0–44)
AST: 21 U/L (ref 15–41)
Albumin: 4 g/dL (ref 3.5–5.0)
Alkaline Phosphatase: 75 U/L (ref 38–126)
Anion gap: 6 (ref 5–15)
BUN: 17 mg/dL (ref 8–23)
CO2: 29 mmol/L (ref 22–32)
Calcium: 9.3 mg/dL (ref 8.9–10.3)
Chloride: 104 mmol/L (ref 98–111)
Creatinine, Ser: 0.62 mg/dL (ref 0.44–1.00)
GFR, Estimated: >60 mL/min (ref >60)
Glucose, Bld: 149 mg/dL — ABNORMAL HIGH (ref 70–99)
Potassium: 4 mmol/L (ref 3.5–5.1)
Sodium: 139 mmol/L (ref 135–145)
Total Bilirubin: 0.7 mg/dL (ref 0.0–1.2)
Total Protein: 6.6 g/dL (ref 6.5–8.1)

## 2024-06-03 LAB — CBC WITH DIFFERENTIAL/PLATELET
Abs Immature Granulocytes: 0.01 10*3/uL (ref 0.00–0.07)
Basophils Absolute: 0 10*3/uL (ref 0.0–0.1)
Basophils Relative: 1 %
Eosinophils Absolute: 0.1 10*3/uL (ref 0.0–0.5)
Eosinophils Relative: 2 %
HCT: 41.1 % (ref 36.0–46.0)
Hemoglobin: 13.9 g/dL (ref 12.0–15.0)
Immature Granulocytes: 0 %
Lymphocytes Relative: 20 %
Lymphs Abs: 0.8 10*3/uL (ref 0.7–4.0)
MCH: 30.5 pg (ref 26.0–34.0)
MCHC: 33.8 g/dL (ref 30.0–36.0)
MCV: 90.1 fL (ref 80.0–100.0)
Monocytes Absolute: 0.6 10*3/uL (ref 0.1–1.0)
Monocytes Relative: 14 %
Neutro Abs: 2.6 10*3/uL (ref 1.7–7.7)
Neutrophils Relative %: 63 %
Platelets: 181 10*3/uL (ref 150–400)
RBC: 4.56 MIL/uL (ref 3.87–5.11)
RDW: 15.7 % — ABNORMAL HIGH (ref 11.5–15.5)
WBC: 4.2 10*3/uL (ref 4.0–10.5)
nRBC: 0 % (ref 0.0–0.2)

## 2024-06-03 MED ORDER — HEPARIN SOD (PORK) LOCK FLUSH 100 UNIT/ML IV SOLN
INTRAVENOUS | Status: AC
  Filled 2024-06-03: qty 5

## 2024-06-03 MED ORDER — IOHEXOL 300 MG/ML  SOLN
100.0000 mL | Freq: Once | INTRAMUSCULAR | Status: AC | PRN
  Administered 2024-06-03: 100 mL via INTRAVENOUS

## 2024-06-03 MED ORDER — SODIUM CHLORIDE (PF) 0.9 % IJ SOLN
INTRAMUSCULAR | Status: AC
  Filled 2024-06-03: qty 50

## 2024-06-03 MED ORDER — SODIUM CHLORIDE 0.9% FLUSH
10.0000 mL | Freq: Once | INTRAVENOUS | Status: AC
  Administered 2024-06-03: 10 mL

## 2024-06-04 LAB — CA 125: Cancer Antigen (CA) 125: 10.1 U/mL (ref 0.0–38.1)

## 2024-06-06 ENCOUNTER — Telehealth: Payer: Self-pay

## 2024-06-06 NOTE — Telephone Encounter (Signed)
 Called her back. She is taking antibiotics currently for UTI. She said that she is almost finished with antibiotics. Forwarded message to Dr. Lonn.

## 2024-06-06 NOTE — Telephone Encounter (Signed)
 Called and left a message asking her to call the office back. Asking her if she has any UTI symptoms. If she has any UTI symptoms, then order UA and urine culture.  Per Dr. Lonn, CT showed something abnormal  about her bladder.

## 2024-06-11 ENCOUNTER — Encounter: Payer: Self-pay | Admitting: Hematology and Oncology

## 2024-06-11 ENCOUNTER — Telehealth: Payer: Self-pay | Admitting: Oncology

## 2024-06-11 ENCOUNTER — Inpatient Hospital Stay: Attending: Psychiatry | Admitting: Hematology and Oncology

## 2024-06-11 VITALS — BP 142/84 | HR 78 | Temp 98.2°F | Resp 18 | Ht 61.0 in | Wt 108.4 lb

## 2024-06-11 DIAGNOSIS — Z1502 Genetic susceptibility to malignant neoplasm of ovary: Secondary | ICD-10-CM | POA: Insufficient documentation

## 2024-06-11 DIAGNOSIS — C561 Malignant neoplasm of right ovary: Secondary | ICD-10-CM | POA: Diagnosis not present

## 2024-06-11 DIAGNOSIS — R300 Dysuria: Secondary | ICD-10-CM | POA: Diagnosis not present

## 2024-06-11 DIAGNOSIS — Z8543 Personal history of malignant neoplasm of ovary: Secondary | ICD-10-CM | POA: Diagnosis not present

## 2024-06-11 DIAGNOSIS — Z9221 Personal history of antineoplastic chemotherapy: Secondary | ICD-10-CM | POA: Diagnosis not present

## 2024-06-11 NOTE — Progress Notes (Signed)
 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 Eldonna in September and I plan to see her in 6 months with repeat imaging study We discussed port maintenance and she would like to get her port removed  Orders Placed This Encounter  Procedures   IR REMOVAL TUN ACCESS W/ PORT W/O FL MOD SED    Standing Status:   Future    Expected Date:   06/18/2024    Expiration Date:   06/11/2025    Reason for exam::   no need port    Preferred Imaging Location?:   [facility] [place]   CT ABDOMEN PELVIS W CONTRAST    Standing Status:   Future    Expected Date:   12/09/2024    Expiration Date:   06/11/2025    Scheduling Instructions:     No need oral contrast    If indicated for the ordered procedure, I authorize the administration of contrast media per Radiology protocol:   Yes    Does the patient have a contrast media/X-ray dye allergy?:   No    Preferred imaging location?:   [facility]    If indicated for the ordered procedure, I authorize the administration of oral contrast media per Radiology protocol:   No    Reason for no oral contrast::   No need oral contrast     Hicks Lonn, MD  INTERVAL HISTORY: she returns for surveillance follow-up with her son She completed last cycle of therapy without  major side effects I reviewed her blood work, tumor marker and imaging studies We discussed risk and benefits of port maintenance and future follow-up  PHYSICAL EXAMINATION: ECOG PERFORMANCE STATUS: 0 - Asymptomatic  Vitals:   06/11/24 1028  BP: (!) 142/84  Pulse: 78  Resp: 18  Temp: 98.2 F (36.8 C)  SpO2: 99%   Filed Weights   06/11/24 1028  Weight: 108 lb 6.4 oz (49.2 kg)    Relevant data reviewed during this visit included CBC, CMP, CA125, CT imaging from June 2025 in comparison to prior imaging from 2024

## 2024-06-11 NOTE — Assessment & Plan Note (Addendum)
 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 Eldonna in September and I plan to see her in 6 months with repeat imaging study We discussed port maintenance and she would like to get her port removed

## 2024-06-11 NOTE — Telephone Encounter (Signed)
 Called Lori Conrad and advised her of appointment to follow up with Dr. Eldonna on 09/23/2024 at 3:00.  She verbalized understanding and agreement.

## 2024-06-18 ENCOUNTER — Ambulatory Visit (HOSPITAL_COMMUNITY)
Admission: RE | Admit: 2024-06-18 | Discharge: 2024-06-18 | Disposition: A | Discharge status: Home / Self Care | Source: Ambulatory Visit | Attending: Hematology and Oncology

## 2024-06-18 DIAGNOSIS — C561 Malignant neoplasm of right ovary: Secondary | ICD-10-CM | POA: Diagnosis not present

## 2024-06-18 DIAGNOSIS — Z452 Encounter for adjustment and management of vascular access device: Secondary | ICD-10-CM | POA: Insufficient documentation

## 2024-06-18 HISTORY — PX: IR REMOVAL TUN ACCESS W/ PORT W/O FL MOD SED: IMG2290

## 2024-06-18 MED ORDER — LIDOCAINE-EPINEPHRINE 1 %-1:100000 IJ SOLN
20.0000 mL | Freq: Once | INTRAMUSCULAR | Status: AC
  Administered 2024-06-18: 10 mL via INTRADERMAL

## 2024-06-18 MED ORDER — LIDOCAINE-EPINEPHRINE 1 %-1:100000 IJ SOLN
INTRAMUSCULAR | Status: AC
  Filled 2024-06-18: qty 1

## 2024-06-18 NOTE — Procedures (Signed)
 Vascular and Interventional Radiology Procedure Note  Patient: Lori Conrad DOB: February 28, 1946 Medical Record Number: [id number] Note Date/Time: 06/18/24 4:29 PM   Performing Physician: Thom Hall, MD Assistant(s): None  Diagnosis: GYN CA  Procedure: PORT REMOVAL  Anesthesia: Local Anesthetic Complications: None Estimated Blood Loss: Minimal Specimens:  None  Findings:  Successful removal of a right-sided venous port. Primary incision closure. Dermabond at skin.  See detailed procedure note with images in PACS. The patient tolerated the procedure well without incident or complication and was returned to Recovery in stable condition.    Thom Hall, MD [facility]   Pager. [phone] Clinic. [phone]

## 2024-06-24 DIAGNOSIS — R21 Rash and other nonspecific skin eruption: Secondary | ICD-10-CM | POA: Diagnosis not present

## 2024-06-24 DIAGNOSIS — E039 Hypothyroidism, unspecified: Secondary | ICD-10-CM | POA: Diagnosis not present

## 2024-06-24 DIAGNOSIS — E785 Hyperlipidemia, unspecified: Secondary | ICD-10-CM | POA: Diagnosis not present

## 2024-06-27 ENCOUNTER — Telehealth: Payer: Self-pay

## 2024-06-27 NOTE — Telephone Encounter (Signed)
 Pt's son, RENETTE called and requested a referral to [facility]to assess her cognitive decline. He states he has noticed a decline in her memory. Denies pt having PCP, but that pt is in Assisted Living and there is a PA who comes in to see the residents. Advised JJ he should bring this concern up to DON at ALF to have PA come in to see pt, then make the referral if appropriate. He is agreeable to this plan. Message sent to [facility] for awareness.

## 2024-07-02 DIAGNOSIS — H5319 Other subjective visual disturbances: Secondary | ICD-10-CM | POA: Diagnosis not present

## 2024-07-02 DIAGNOSIS — H35033 Hypertensive retinopathy, bilateral: Secondary | ICD-10-CM | POA: Diagnosis not present

## 2024-07-02 DIAGNOSIS — H353221 Exudative age-related macular degeneration, left eye, with active choroidal neovascularization: Secondary | ICD-10-CM | POA: Diagnosis not present

## 2024-07-02 DIAGNOSIS — H43813 Vitreous degeneration, bilateral: Secondary | ICD-10-CM | POA: Diagnosis not present

## 2024-07-02 DIAGNOSIS — H353112 Nonexudative age-related macular degeneration, right eye, intermediate dry stage: Secondary | ICD-10-CM | POA: Diagnosis not present

## 2024-07-11 DIAGNOSIS — E039 Hypothyroidism, unspecified: Secondary | ICD-10-CM | POA: Diagnosis not present

## 2024-07-11 DIAGNOSIS — E785 Hyperlipidemia, unspecified: Secondary | ICD-10-CM | POA: Diagnosis not present

## 2024-07-16 DIAGNOSIS — H353221 Exudative age-related macular degeneration, left eye, with active choroidal neovascularization: Secondary | ICD-10-CM | POA: Diagnosis not present

## 2024-07-18 DIAGNOSIS — R4182 Altered mental status, unspecified: Secondary | ICD-10-CM | POA: Diagnosis not present

## 2024-07-19 DIAGNOSIS — L282 Other prurigo: Secondary | ICD-10-CM | POA: Diagnosis not present

## 2024-07-19 DIAGNOSIS — D1801 Hemangioma of skin and subcutaneous tissue: Secondary | ICD-10-CM | POA: Diagnosis not present

## 2024-07-19 DIAGNOSIS — Z85828 Personal history of other malignant neoplasm of skin: Secondary | ICD-10-CM | POA: Diagnosis not present

## 2024-07-19 DIAGNOSIS — L821 Other seborrheic keratosis: Secondary | ICD-10-CM | POA: Diagnosis not present

## 2024-07-19 DIAGNOSIS — L2989 Other pruritus: Secondary | ICD-10-CM | POA: Diagnosis not present

## 2024-07-24 DIAGNOSIS — R030 Elevated blood-pressure reading, without diagnosis of hypertension: Secondary | ICD-10-CM | POA: Diagnosis not present

## 2024-07-24 DIAGNOSIS — C569 Malignant neoplasm of unspecified ovary: Secondary | ICD-10-CM | POA: Diagnosis not present

## 2024-07-24 DIAGNOSIS — G894 Chronic pain syndrome: Secondary | ICD-10-CM | POA: Diagnosis not present

## 2024-07-24 DIAGNOSIS — Z87440 Personal history of urinary (tract) infections: Secondary | ICD-10-CM | POA: Diagnosis not present

## 2024-07-29 DIAGNOSIS — L259 Unspecified contact dermatitis, unspecified cause: Secondary | ICD-10-CM | POA: Diagnosis not present

## 2024-07-29 DIAGNOSIS — G3184 Mild cognitive impairment, so stated: Secondary | ICD-10-CM | POA: Diagnosis not present

## 2024-07-29 DIAGNOSIS — E039 Hypothyroidism, unspecified: Secondary | ICD-10-CM | POA: Diagnosis not present

## 2024-08-03 DIAGNOSIS — R300 Dysuria: Secondary | ICD-10-CM | POA: Diagnosis not present

## 2024-08-08 DIAGNOSIS — I10 Essential (primary) hypertension: Secondary | ICD-10-CM | POA: Diagnosis not present

## 2024-08-08 DIAGNOSIS — E785 Hyperlipidemia, unspecified: Secondary | ICD-10-CM | POA: Diagnosis not present

## 2024-08-19 DIAGNOSIS — I10 Essential (primary) hypertension: Secondary | ICD-10-CM | POA: Diagnosis not present

## 2024-08-19 DIAGNOSIS — N390 Urinary tract infection, site not specified: Secondary | ICD-10-CM | POA: Diagnosis not present

## 2024-08-19 DIAGNOSIS — E039 Hypothyroidism, unspecified: Secondary | ICD-10-CM | POA: Diagnosis not present

## 2024-08-20 DIAGNOSIS — H353221 Exudative age-related macular degeneration, left eye, with active choroidal neovascularization: Secondary | ICD-10-CM | POA: Diagnosis not present

## 2024-08-28 ENCOUNTER — Other Ambulatory Visit: Payer: Self-pay | Admitting: Hematology and Oncology

## 2024-08-28 DIAGNOSIS — Z1231 Encounter for screening mammogram for malignant neoplasm of breast: Secondary | ICD-10-CM

## 2024-09-02 DIAGNOSIS — L309 Dermatitis, unspecified: Secondary | ICD-10-CM | POA: Diagnosis not present

## 2024-09-02 DIAGNOSIS — L282 Other prurigo: Secondary | ICD-10-CM | POA: Diagnosis not present

## 2024-09-02 DIAGNOSIS — Z85828 Personal history of other malignant neoplasm of skin: Secondary | ICD-10-CM | POA: Diagnosis not present

## 2024-09-06 DIAGNOSIS — I10 Essential (primary) hypertension: Secondary | ICD-10-CM | POA: Diagnosis not present

## 2024-09-06 DIAGNOSIS — E785 Hyperlipidemia, unspecified: Secondary | ICD-10-CM | POA: Diagnosis not present

## 2024-09-10 DIAGNOSIS — Z1329 Encounter for screening for other suspected endocrine disorder: Secondary | ICD-10-CM | POA: Diagnosis not present

## 2024-09-10 DIAGNOSIS — Z131 Encounter for screening for diabetes mellitus: Secondary | ICD-10-CM | POA: Diagnosis not present

## 2024-09-10 DIAGNOSIS — E785 Hyperlipidemia, unspecified: Secondary | ICD-10-CM | POA: Diagnosis not present

## 2024-09-16 DIAGNOSIS — I499 Cardiac arrhythmia, unspecified: Secondary | ICD-10-CM | POA: Diagnosis not present

## 2024-09-16 DIAGNOSIS — I10 Essential (primary) hypertension: Secondary | ICD-10-CM | POA: Diagnosis not present

## 2024-09-23 ENCOUNTER — Encounter: Payer: Self-pay | Admitting: Psychiatry

## 2024-09-23 ENCOUNTER — Inpatient Hospital Stay: Attending: Psychiatry | Admitting: Psychiatry

## 2024-09-23 ENCOUNTER — Inpatient Hospital Stay

## 2024-09-23 VITALS — BP 101/54 | HR 94 | Temp 98.7°F | Resp 18 | Wt 110.0 lb

## 2024-09-23 DIAGNOSIS — Z9079 Acquired absence of other genital organ(s): Secondary | ICD-10-CM | POA: Insufficient documentation

## 2024-09-23 DIAGNOSIS — Z90722 Acquired absence of ovaries, bilateral: Secondary | ICD-10-CM | POA: Insufficient documentation

## 2024-09-23 DIAGNOSIS — Z87440 Personal history of urinary (tract) infections: Secondary | ICD-10-CM | POA: Insufficient documentation

## 2024-09-23 DIAGNOSIS — Z90710 Acquired absence of both cervix and uterus: Secondary | ICD-10-CM | POA: Insufficient documentation

## 2024-09-23 DIAGNOSIS — I10 Essential (primary) hypertension: Secondary | ICD-10-CM | POA: Diagnosis not present

## 2024-09-23 DIAGNOSIS — C561 Malignant neoplasm of right ovary: Secondary | ICD-10-CM

## 2024-09-23 DIAGNOSIS — Z8543 Personal history of malignant neoplasm of ovary: Secondary | ICD-10-CM | POA: Diagnosis not present

## 2024-09-23 DIAGNOSIS — Z9221 Personal history of antineoplastic chemotherapy: Secondary | ICD-10-CM | POA: Diagnosis not present

## 2024-09-23 DIAGNOSIS — E039 Hypothyroidism, unspecified: Secondary | ICD-10-CM | POA: Diagnosis not present

## 2024-09-23 DIAGNOSIS — I499 Cardiac arrhythmia, unspecified: Secondary | ICD-10-CM | POA: Diagnosis not present

## 2024-09-23 DIAGNOSIS — Z08 Encounter for follow-up examination after completed treatment for malignant neoplasm: Secondary | ICD-10-CM | POA: Diagnosis not present

## 2024-09-23 NOTE — Patient Instructions (Signed)
It was a pleasure to see you in clinic today. - Normal exam today. - Return visit planned for 6 months.  Thank you very much for allowing me to provide care for you today.  I appreciate your confidence in choosing our Gynecologic Oncology team at [facility].  If you have any questions about your visit today please call our office or send [place] a MyChart message and we will get back to you as soon as possible.

## 2024-09-23 NOTE — Progress Notes (Signed)
 Gynecologic Oncology Return Clinic Visit  Date of Service: 09/23/2024 Referring Provider: Lonn Hicks, MD [address] [place],  [place] [address]  Assessment & Plan: Lori Conrad is a 78 y.o. 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 Comer Dollar, followed by 4 cycles of NACT (last given on 01/26/24) and subsequent RA-TLH, BSO, omentectomy, tumor debulking, appendectomy, enterolysis, small bowel oversew, right ureterolysis on 02/20/24 with a foci of residual carcinoma removed from the rectal serosa, all other tissue treated, without active disease. Completed 3 cycles of adjuvant chemo (7 total cycles) on 05/03/24. Presents today for surveillance.  Ovarian cancer: - S/p 4C NACT and 3 cycles of adjuvant chemo.  - Interval debulking surgery with only one small foci of residual active disease, all other else treated. 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 NGS HRD neg. - Post treatment Ct on 06/03/24 NED. - NED on exam today. - CA125 is a marker for her. Continue with surveillance visit. Pending today. - Signs/symptoms of recurrence reviewed.  - Surveillance of ovarian cancer reviewed  UTIs: - Pt and son report multiple UTIs after treatment. On review of their records was treated with antibiotics in August and may have had a UA and possible Ucx at that time but not certain at time of visit. Asymptomatic since most recent treatment. - If >= 3 UTIs (ideally culture proven) in 1 year, would meet criteria for recurrent UTI and would consider referral to Urology for additional work-up. - Will defer referral at this time given asymptomatic in past 2 months. Could consider topical estrogen in the future as well but will hold off at this time.   RTC with Dr. Rick on 12/17/24. RTC with me April 2026.   Hoy Masters, MD Gynecologic  Oncology   Medical Decision Making I personally spent  TOTAL 30 minutes face-to-face and non-face-to-face in the care of this patient, which includes all pre, intra, and post visit time on the date of service.    ----------------------- Reason for Visit: Surveillance  Treatment History: Oncology History Overview Note  High grade serous, p53 null type. 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 EXAM: IMPLANTED PORT A CATH PLACEMENT WITH ULTRASOUND AND FLUOROSCOPIC GUIDANCE COMPARISON:  None Available. MEDICATIONS: None. ANESTHESIA/SEDATION: Moderate (conscious) sedation was employed during this procedure. A total of Versed  1 mg and Fentanyl  50 mcg was administered intravenously. Moderate Sedation Time: 15 minutes. The patient's level of consciousness and vital signs were monitored continuously by radiology nursing throughout the procedure under my direct supervision. CONTRAST:  None FLUOROSCOPY TIME:  One mGy COMPLICATIONS: None immediate. PROCEDURE: The procedure, risks, benefits, and alternatives were explained to the patient. Questions regarding the procedure were encouraged and answered. The patient understands and consents to the procedure. The right neck and chest were prepped with chlorhexidine  in a sterile fashion, and a sterile drape was applied covering the operative field. Maximum barrier sterile technique with sterile gowns and gloves were used for the procedure. A timeout was performed prior to the initiation of the procedure. Ultrasound was used to examine the jugular vein which was compressible and free of internal echoes. A skin marker was used to demarcate the planned venotomy  and port pocket incision sites. Local anesthesia was  provided to these sites and the subcutaneous tunnel track with 1% lidocaine  with 1:100,000 epinephrine . A small incision was created at the jugular access site and blunt dissection was performed of the subcutaneous tissues. Under ultrasound guidance, the jugular vein was accessed with a 21 ga micropuncture needle and an 0.018 wire was inserted to the superior vena cava. Real-time ultrasound guidance was utilized for vascular access including the acquisition of a permanent ultrasound image documenting patency of the accessed vessel. A 5 Fr micopuncture set was then used, through which a 0.035 Rosen wire was passed under fluoroscopic guidance into the inferior vena cava. An 8 Fr dilator was then placed over the wire. A subcutaneous port pocket was then created along the upper chest wall utilizing a combination of sharp and blunt dissection. The pocket was irrigated with sterile saline, packed with gauze, and observed for hemorrhage. A single lumen plastic power injectable port was chosen for placement. The 8 Fr catheter was tunneled from the port pocket site to the venotomy incision. The port was placed in the pocket. The external catheter was trimmed to appropriate length. The dilator was exchanged for an 8 Fr peel-away sheath under fluoroscopic guidance. The catheter was then placed through the sheath and the sheath was removed. Final catheter positioning was confirmed and documented with a fluoroscopic spot radiograph. The port was accessed with a Huber needle, aspirated, and flushed with heparinized saline. The deep dermal layer of the port pocket incision was closed with interrupted 3-0 Vicryl suture. Dermabond was then placed over the port pocket and neck incisions. The patient tolerated the procedure well without immediate post procedural complication. FINDINGS: After catheter placement, the tip lies within the superior cavoatrial junction. The catheter aspirates and flushes normally and is ready for  immediate use. IMPRESSION: Successful placement of a power injectable Port-A-Cath via the right internal jugular vein. The catheter is ready for immediate use. Ester Sides, MD [facility] Radiology Electronically Signed   By: Ester Sides M.D.   On: 11/01/2023 13:22   MR ABDOMEN W WO CONTRAST  Result Date: 10/31/2023 CLINICAL DATA:  Pelvic mass.  Liver lesion EXAM: MRI ABDOMEN WITHOUT AND WITH CONTRAST TECHNIQUE: Multiplanar multisequence MR imaging of the abdomen was performed both before and after the administration of intravenous contrast. CONTRAST:  5mL GADAVIST  GADOBUTROL  1 MMOL/ML IV SOLN COMPARISON:  CT 10/30/2023 and older. FINDINGS: Lower chest: Trace pleural fluid. Hepatobiliary: Numerous bright T2, low T1 nonenhancing foci identified consistent with benign cystic lesions. Many of these are under 15 mm. There are some larger foci identified such as segment 4 measuring 4.3 cm and caudate measuring 2.8 cm. Prior CT did demonstrate 1 lesion which is more complex anteriorly in the left hepatic lobe, segment 3 which on today's examination when taking into account motion show some progressive enhancement, is bright on T2 but not as bright as simple fluid and consistent with a small hemangioma. No specific aggressive liver lesion clearly identified today. Patent portal vein. Gallbladder is nondilated. No biliary ductal dilatation. Pancreas: Ectatic pancreatic duct diffusely measuring up to 6 mm, severe. No pancreatic focal atrophy, abnormal enhancement or abnormal T1 signal. No restricted diffusion along the pancreas. Spleen:  Within normal limits in size and appearance. Adrenals/Urinary Tract: Adrenal glands are preserved. No enhancing renal mass or collecting system dilatation. Stomach/Bowel: Visualized bowel is nondilated. This includes visualized portions of the small and large bowel. The stomach is underdistended. Vascular/Lymphatic:  Normal caliber  aorta and IVC. Atherosclerotic changes along the aorta. Circumaortic left renal vein. Once again there is a abnormal lymph node identified anterior to the aorta in the upper abdomen on series 1602, image 58 measuring 2.3 x 1.8 cm. Few other small retroperitoneal nodes identified. Other:  Trace ascites.  Mesenteric stranding. Musculoskeletal: Curvature and degenerative changes along the spine. IMPRESSION: Multiple benign-appearing liver lesions including cysts and 1 hemangioma. Persistent enlarged upper abdominal retroperitoneal lymph node. Additional smaller but prominent nodes as well. With the pelvic findings these are worrisome for potential spread of neoplasm. Mild ascites. Electronically Signed   By: Ranell Bring M.D.   On: 10/31/2023 14:10   MR PELVIS W WO CONTRAST  Result Date: 10/31/2023 CLINICAL DATA:  Pelvic mass EXAM: MRI PELVIS WITHOUT AND WITH CONTRAST TECHNIQUE: Multiplanar multisequence MR imaging of the pelvis was performed both before and after administration of intravenous contrast. CONTRAST:  5mL GADAVIST  GADOBUTROL  1 MMOL/ML IV SOLN COMPARISON:  CT 10/30/2019 FINDINGS: Urinary Tract: Bladder is mildly distended with fluid. There is some mass effect along the posterior aspect of the bladder related to the adjacent complex mass. The bladder itself appears grossly intact. No abnormal wall enhancement. Grossly preserved course of the urethra. Bowel: The visualized bowel in the pelvis is nondilated. However there is lobular masslike area along the distal sigmoid colon with the areas of heterogeneous enhancement. The dominant lesion in this location on series 4, image 29 measures 3.7 by 2.6 cm. There are several adjacent soft tissue nodules as well such as just posterior to the cervix and anterior to the bowel measuring 10 mm on series 22 image 30. Focus superior left lateral series 22, image 26 measures 2.3 x 1.7 cm. Additional foci elsewhere dependently in the pelvis including the presacral  space. Vascular/Lymphatic: Atherosclerotic changes identified along the iliac vessels. No separate nodal enlargement identified. Reproductive: Uterus measures 8.2 by 2.0 by 3.4 cm. Endometrial stripe is less than 3 mm. Slightly heterogeneous myometrium. Anterior to the uterus is a large complex cystic and solid mass with heterogeneous enhancement of the solid component. Lesion measures 11.5 by 7.6 by 7.4 cm. The more solid component is right lateral inferior with a aggressive in enhancement measuring proximally 7.7 by 5.6 cm. Cephalocaudal length 8.5 cm. The cystic component more towards the left has a dimensions approaching 8.7 cm. There is surrounding free fluid and edema. Although this more in the central pelvis towards midline a right ovary is not seen as a separate structure. There is what appears to be a small postmenopausal of the left ovary measuring 15 mm. An ovarian neoplasm is a strong consideration. Other:  Small amount of free fluid in the pelvis.  Edema. Musculoskeletal: Curvature of the spine. Moderate degenerative changes of the lumbar spine with disc bulging and areas of stenosis greatest at L4-5. Degenerative changes of the pelvis and hips as well. Study is somewhat limited due to some artifacts postcontrast axial dataset as well as study being performed as a standard pelvis rather than a gynecologic pelvis exam. Please see separate dictation of abdomen MRI. IMPRESSION: Large complex cystic and solid pelvic mass centrally measuring up to 11.5 x 7.6 x 7.4 cm. Based on overall appearance this has worrisome for a neoplasm including an ovarian malignancy. Small amount of ascites in the pelvis. Soft tissue enhancing aggressive nodules along the course of the sigmoid colon as well as in the adjacent fat and presacral spaces. With the larger central mass this very well could be  spread of disease to adjacent structures rather than a primary colonic process but correlate with symptoms and if needed  colonoscopy. Please see separate dictation of abdominal MRI. Electronically Signed   By: Ranell Bring M.D.   On: 10/31/2023 14:01   CT CHEST W CONTRAST  Result Date: 10/31/2023 CLINICAL DATA:  78 year old female with suspected gynecologic malignancy. * Tracking Code: BO * EXAM: CT CHEST WITH CONTRAST TECHNIQUE: Multidetector CT imaging of the chest was performed during intravenous contrast administration. RADIATION DOSE REDUCTION: This exam was performed according to the departmental dose-optimization program which includes automated exposure control, adjustment of the mA and/or kV according to patient size and/or use of iterative reconstruction technique. CONTRAST:  75mL OMNIPAQUE  IOHEXOL  350 MG/ML SOLN COMPARISON:  None Available. FINDINGS: Cardiovascular: The heart size is mildly enlarged. No pericardial effusion. Aortic atherosclerosis and coronary artery calcification. Mediastinum/Nodes: Trachea and esophagus appear unremarkable. The right lobe of thyroid  gland appears surgically absent. No mediastinal or hilar adenopathy. Lungs/Pleura: No pleural effusion identified. Subsegmental atelectasis identified within the lingula and bilateral posterior lung bases. No signs of interstitial edema or airspace consolidation. No suspicious pulmonary nodule identified to suggest lung metastases. Upper Abdomen: No acute abnormality. Multiple liver cysts. Enlarged lymph node within the portal caval region measures 1.5 cm, image 155/3. Defer to report from CT AP dated 10/30/2019 for and same-day MRI of the abdomen pelvis for further details. Musculoskeletal: Mild curvature of the thoracic spine and lumbar spine is convex towards the left. Multilevel degenerative disc disease. No acute or suspicious osseous lesions. IMPRESSION: 1. No signs of metastatic disease to the chest. 2. Areas of subsegmental atelectasis noted within bilateral posterior lung bases and lingula. 3. Enlarged upper abdominal lymph node as above. In the  setting of a known malignancy this is concerning for nodal metastasis. 4. Coronary artery calcifications. 5.  Aortic Atherosclerosis (ICD10-I70.0). Electronically Signed   By: Waddell Calk M.D.   On: 10/31/2023 05:57   CT ABDOMEN PELVIS W CONTRAST  Result Date: 10/30/2023 CLINICAL DATA:  Abdominal pain. EXAM: CT ABDOMEN AND PELVIS WITH CONTRAST TECHNIQUE: Multidetector CT imaging of the abdomen and pelvis was performed using the standard protocol following bolus administration of intravenous contrast. RADIATION DOSE REDUCTION: This exam was performed according to the departmental dose-optimization program which includes automated exposure control, adjustment of the mA and/or kV according to patient size and/or use of iterative reconstruction technique. CONTRAST:  75mL OMNIPAQUE  IOHEXOL  350 MG/ML SOLN COMPARISON:  Limb 1424 FINDINGS: Lower chest: No acute findings. Hepatobiliary: Multiple hepatic cysts evident. Scattered tiny hypodensities in the liver parenchyma are too small to characterize but are statistically most likely benign. No followup imaging is recommended. Tiny subcapsular lesion measured previously at 9 mm in the anterior left liver is stable on image 23/3 today, nonspecific. There is no evidence for gallstones, gallbladder wall thickening, or pericholecystic fluid. No intrahepatic or extrahepatic biliary dilation. Pancreas: Dilatation of the pancreatic duct to the head and body of pancreas is similar to prior. Spleen: No splenomegaly. No suspicious focal mass lesion. Adrenals/Urinary Tract: No adrenal nodule or mass. Kidneys unremarkable. No evidence for hydroureter. Bladder is distended. Stomach/Bowel: Stomach is unremarkable. No gastric wall thickening. No evidence of outlet obstruction. Duodenum is normally positioned as is the ligament of Treitz. No small bowel wall thickening. No small bowel dilatation. Diverticular changes are noted in the left colon without evidence of diverticulitis.  Vascular/Lymphatic: 16 mm short axis portal caval lymph node seen on 22/3. No para-aortic lymphadenopathy. No pelvic sidewall lymphadenopathy.  Reproductive: Multiple uterine fibroids evident. As noted on prior study there is an area of the anterior cervix that appears to obliterate the fat plane between the cervix and the posterior wall of the bladder (see sagittal 86/7). Small soft tissue nodules are again noted in the cul-de-sac some of which may pertain to diverticuli, but others raise concern for peritoneal nodularity (see images 61 and 56 of series 3). Other: No substantial free fluid. Musculoskeletal: No worrisome lytic or sclerotic osseous abnormality. IMPRESSION: 1. Multiple uterine fibroids. As noted on prior study there is an area of the anterior cervix that appears to obliterate the fat plane between the cervix and the posterior wall of the bladder. This is concerning for a cervical mass. Gynecologic consultation recommended. 2. Small soft tissue nodules in the cul-de-sac some of which may relate to diverticuli, but others raise concern for peritoneal nodularity. Attention on follow-up recommended. PET-CT may prove helpful to further evaluate 3. 16 mm short axis portal caval lymph node, metastatic disease not excluded. 4. Left colonic diverticulosis without diverticulitis. Electronically Signed   By: Camellia Candle M.D.   On: 10/30/2023 13:08   CT ABDOMEN PELVIS W CONTRAST  Result Date: 10/26/2023 CLINICAL DATA:  Pt w/ abnormal US ; mass in pelvis; no h/o cancer; no pain; no urinary issues EXAM: CT ABDOMEN AND PELVIS WITH CONTRAST TECHNIQUE: Multidetector CT imaging of the abdomen and pelvis was performed using the standard protocol following bolus administration of intravenous contrast. RADIATION DOSE REDUCTION: This exam was performed according to the departmental dose-optimization program which includes automated exposure control, adjustment of the mA and/or kV according to patient size and/or use  of iterative reconstruction technique. CONTRAST:  100mL ISOVUE -300 IOPAMIDOL  (ISOVUE -300) INJECTION 61% COMPARISON:  Ultrasound pelvis 09/01/2023, ultrasound thyroid  10/10/2023 FINDINGS: Lower chest: Mitral annular calcification. Aortic valve leaflet calcification. No acute abnormality. Hepatobiliary: Multiple fluid density lesions scattered throughout the left right hepatic lobe. There an indeterminate 0.9 cm left hepatic lobe hypodensity with Hounsfield unit of 77 (2:29). No gallstones, gallbladder wall thickening, or pericholecystic fluid. No biliary dilatation. Pancreas: No focal lesion. Normal pancreatic contour. No surrounding inflammatory changes. No main pancreatic ductal dilatation. Spleen: Normal in size without focal abnormality. Adrenals/Urinary Tract: No adrenal nodule bilaterally. Bilateral kidneys enhance symmetrically. No hydronephrosis. No hydroureter. The urinary bladder is unremarkable. On delayed imaging, there is no urothelial wall thickening and there are no filling defects in the opacified portions of the bilateral collecting systems or ureters. Stomach/Bowel: Stomach is within normal limits. No evidence of small bowel wall thickening or dilatation. Increased stool burden proximal to the distal sigmoid colon mass with stool throughout the ascending, transverse, descending colon. Short segment of distal sigmoid colon irregular bowel wall thickening (5:26, 2:62). No large bowel luminal dilatation. Colonic diverticulosis appendix appears normal. Vascular/Lymphatic: No abdominal aorta or iliac aneurysm. 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 No abdominal, pelvic, or inguinal lymphadenopathy. Reproductive: There is a heterogeneous solid and cystic 11 x 9 cm mass arising from the uterine fundus. Finding is noted to invade into the urinary bladder dome (6:81, 5:56 close) where there is loss of intraperitoneal and lower  ring of the urinary bladder wall margin. The mass is noted to abut and appears to be inseparable from a short segment of distal sigmoid colon in the region of irregular bowel wall thickening. Other: No intraperitoneal free fluid. No intraperitoneal free gas. No organized fluid collection. Musculoskeletal: No abdominal wall hernia or  abnormality. No suspicious lytic or blastic osseous lesions. No acute displaced fracture. Multilevel severe degenerative changes of the spine. Grade 1 anterolisthesis of L4 on L5 and L5 on S1. Mild retrolisthesis of L2 on L3 and L3 on L4. Dextroscoliosis centered at the L3-L4 level. IMPRESSION: 1. An 11 x 9 cm heterogeneous solid and cystic mass arises from the uterine fundus and is noted to invade the urinary bladder dome wall as well as the distal sigmoid colon. No associated bowel obstruction; however, constipation proximal to irregular bowel wall thickening/mass. No associated stercoral colitis. Finding consistent with malignancy. Recommend gynecologic consultation. When the patient is clinically stable and able to follow directions and hold their breath (preferably as an outpatient) further evaluation with dedicated MRI with and without contrast should be considered. 2. Indeterminate 0.9 cm left hepatic lobe hypodense lesion with a density of 77 HU. Question metastasis versus primary hepatic lesion. 3. Stool throughout the colon 4. Colonic diverticulosis with no acute diverticulitis. 5. 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 6.  Aortic Atherosclerosis (ICD10-I70.0)-severe. These results will be called to the ordering clinician or representative by the Radiologist Assistant, and communication documented in the PACS or [employer]. Electronically Signed   By: Morgane  Naveau M.D.   On: 10/26/2023 18:42   DG BONE DENSITY (DXA)  Result Date: 10/26/2023 EXAM: DUAL X-RAY ABSORPTIOMETRY (DXA) FOR BONE MINERAL DENSITY IMPRESSION: Referring Physician:  LAMAR NG Your  patient completed a bone mineral density test using GE Lunar iDXA system (analysis version: 16). Technologist: BEC PATIENT: Name: Japleen, Tornow Patient ID: [id number] Birth Date: 06/17/1946 Height: 60.5 in. Sex: Female Measured: 10/26/2023 Weight: 110.2 lbs. Indications: Advanced Age, Caucasian, Estrogen Deficient, Height Loss (781.91), History of Osteoporosis, Levothyroxine , Postmenopausal Fractures: Left Ankle Treatments: Calcium  (E943.0), Vitamin D (E933.5) ASSESSMENT: The BMD measured at Forearm Radius 33% is 0.624 g/cm2 with a T-score of -2.9. This patient's diagnostic category is OSTEOPOROSIS according to World Health Organization [facility]) criteria. Comparison to 10/16/2019. Since the prior study, there has been a SIGNIFICANT DECREASE in bone mineral density of the hips (-4.6%). The lumbar spine was excluded due to being excluded from prior exam. The quality of the exam is good. Site Region Measured Date Measured Age YA BMD Significant CHANGE T-score Right Forearm Radius 33% 10/26/2023 78.0 -2.9 0.624 g/cm2 Right Forearm Radius 33% 10/16/2019 74.0 -2.6 0.653 g/cm2 DualFemur Neck Left 10/26/2023 78.0 -0.6 0.952 g/cm2 DualFemur Neck Left 10/16/2019 74.0 -0.5 0.962 g/cm2 DualFemur Total Mean 10/26/2023 78.0 -0.7 0.918 g/cm2 * DualFemur Total Mean 10/16/2019 74.0 -0.4 0.962 g/cm2 World Health Organization [facility]) criteria for post-menopausal, Caucasian Women: Normal       T-score at or above -1 SD Osteopenia   T-score between -1 and -2.5 SD Osteoporosis T-score at or below -2.5 SD RECOMMENDATION: 1. All patients should optimize calcium  and vitamin D intake. 2. Consider FDA-approved medical therapies in postmenopausal women and men aged 80 years and older, based on the following: a. A hip or vertebral (clinical or morphometric) fracture. b. T-score = -2.5 at the femoral neck or spine after appropriate evaluation to exclude secondary causes. c. Low bone mass (T-score between -1.0 and -2.5 at the femoral neck or spine)  and a 10-year probability of a hip fracture = 3% or a 10-year probability of a major osteoporosis-related fracture = 20% based on the [place] -adapted WHO algorithm. d. Clinician judgment and/or patient preferences may indicate treatment for people with 10-year fracture probabilities above or below these levels. FOLLOW-UP: Patients with diagnosis of osteoporosis  or at high risk for fracture should have regular bone mineral density tests.? Patients eligible for Medicare are allowed routine testing every 2 years.? The testing frequency can be increased to one year for patients who have rapidly progressing disease, are receiving or discontinuing medical therapy to restore bone mass, or have additional risk factors. I have reviewed this study and agree with the findings. [facility] Electronically Signed   By: Reyes Phi M.D.   On: 10/26/2023 12:30      11/08/2023 Pathology Results   SURGICAL PATHOLOGY CASE: [id number] PATIENT: [facility] Surgical Pathology Report  Clinical History: Pelvic mass, suspected malignancy (crm)   FINAL MICROSCOPIC DIAGNOSIS:  A. PELVIC SIDEWALL NODULE, RIGHT, EXCISION: - High-grade serous carcinoma, see comment  B. ABDOMINAL WALL #1, ANTERIOR, EXCISION: - High-grade serous carcinoma  COMMENT:  A.  Immunohistochemical stain show that the tumor cells are positive for CK7, PAX8, and p16 (diffuse overexpression).  Immunostain for p53 shows a clonal null expression pattern.  Immunostains for CK20 is negative. This immunoprofile is consistent with the above interpretation and suggestive of an ovarian primary.    11/14/2023 Initial Diagnosis   Malignant ovarian neoplasm, right (HCC)   11/14/2023 Cancer Staging   Staging form: Ovary, Fallopian Tube, and Primary Peritoneal Carcinoma, AJCC 8th Edition - Clinical stage from 11/14/2023: FIGO Stage IIIC (cT3c, cN1, cM0) - Signed by Lonn Hicks, MD on 11/14/2023 Stage prefix: Initial diagnosis   11/20/2023 Tumor  Marker   Patient's tumor was tested for the following markers: CA-125. Results of the tumor marker test revealed 169.   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  No other deleterious variants detected in [facility] CancerNext-Expanded +RNAinsight Panel.  Report date is 12/26/2023.   The CancerNext-Expanded gene panel offered by [facility] and includes sequencing, rearrangement, and RNA analysis for the following 76 genes: AIP, ALK, APC, ATM, AXIN2, BAP1, BARD1, BMPR1A, BRCA1, BRCA2, BRIP1, CDC73, CDH1, CDK4, CDKN1B, CDKN2A, CEBPA, CHEK2, CTNNA1, DDX41, DICER1, ETV6, FH, FLCN, GATA2, LZTR1, MAX, MBD4, MEN1, MET, MLH1, MSH2, MSH3, MSH6, MUTYH, NF1, NF2, NTHL1, PALB2, PHOX2B, PMS2, POT1, PRKAR1A, PTCH1, PTEN, RAD51C, RAD51D, RB1, RET, RUNX1, SDHA, SDHAF2, SDHB, SDHC, SDHD, SMAD4, SMARCA4, SMARCB1, SMARCE1, STK11, SUFU, TMEM127, TP53, TSC1, TSC2, VHL, and WT1 (sequencing and deletion/duplication); EGFR, HOXB13, KIT, MITF, PDGFRA, POLD1, and POLE (sequencing only); EPCAM and GREM1 (deletion/duplication only).     01/05/2024 Tumor Marker   Patient's tumor was tested for the following markers: CA-125. Results of the tumor marker test revealed 130.   01/18/2024 Imaging   CT ABDOMEN PELVIS W CONTRAST Result Date: 01/24/2024 CLINICAL DATA:  78 year old female with history of ovarian cancer status post chemotherapy. Follow-up study. * Tracking Code: BO * EXAM: CT ABDOMEN AND PELVIS WITH CONTRAST TECHNIQUE: Multidetector CT imaging of the abdomen and pelvis was performed using the standard protocol following bolus administration of intravenous contrast. RADIATION DOSE REDUCTION: This exam was performed according to the departmental dose-optimization program which includes automated exposure control, adjustment of  the mA and/or kV according to patient size and/or use of iterative reconstruction technique. CONTRAST:  80mL OMNIPAQUE  IOHEXOL  300 MG/ML  SOLN COMPARISON:  CT of the abdomen and pelvis 12/07/2023. FINDINGS: Lower chest: 2.8 x 0.7 cm new area of pleural-based nodularity in the right lower lobe (axial image  5 of series 2) concerning for probable metastatic lesion. Hepatobiliary: Multiple well-defined low-attenuation lesions scattered throughout the liver, largest of which are all compatible with simple cysts, measuring up to 4.4 x 2.7 cm in segment 4A. Multiple other subcentimeter low-attenuation lesions, too small to definitively characterize, but similar to the prior study and statistically likely to represent tiny cysts and/or biliary hamartomas. Gallbladder is unremarkable in appearance. Pancreas: No pancreatic mass. No pancreatic ductal dilatation. No pancreatic or peripancreatic fluid collections or inflammatory changes. Spleen: Unremarkable. Adrenals/Urinary Tract: Bilateral kidneys and bilateral adrenal glands are normal in appearance. No hydroureteronephrosis. Urinary bladder is unremarkable in appearance. Stomach/Bowel: Appearance of the stomach is normal. There is no pathologic dilatation of small bowel or colon. Numerous colonic diverticula are noted, without definite focal surrounding inflammatory changes to indicate an acute diverticulitis at this time. Previously noted mass in the sigmoid colon is not readily apparent on today's examination. The appendix is not confidently identified and may be surgically absent. Regardless, there are no inflammatory changes noted adjacent to the cecum to suggest the presence of an acute appendicitis at this time. Vascular/Lymphatic: Atherosclerotic calcifications are noted throughout the abdominal aorta and pelvic vasculature. Retroaortic left renal vein (normal anatomical variant) incidentally noted. No definite lymphadenopathy confidently identified in the abdomen or  pelvis. Reproductive: Status post hysterectomy. Left ovary is unremarkable in appearance. Right adnexal mass has regressed when compared to the prior examination, currently measuring 7.8 x 7.4 x 7.7 cm (axial image 51 of series 2 and coronal image 51 of series 5). Other: Previously noted peritoneal implants in the low pelvis appear to have regressed compared to the prior examination. No new peritoneal implants confidently identified. No significant volume of ascites. No pneumoperitoneum. Musculoskeletal: There are no aggressive appearing lytic or blastic lesions noted in the visualized portions of the skeleton. IMPRESSION: 1. Today's study demonstrates a positive response to therapy with regression of previously noted right adnexal mass and peritoneal implants in the low pelvis. 2. However, there is a new pleural-based nodule in the right lower lobe concerning for a metastatic lesion. 3. Colonic diverticulosis without evidence of acute diverticulitis at this time. 4. Aortic atherosclerosis. Aortic Atherosclerosis (ICD10-I70.0). Electronically Signed   By: Toribio Aye M.D.   On: 01/24/2024 10:40      01/26/2024 Tumor Marker   Patient's tumor was tested for the following markers: CA-125. Results of the tumor marker test revealed 60.6.   02/03/2024 Imaging   CT Chest W Contrast Result Date: 02/08/2024 CLINICAL DATA:  Pulmonary nodule. Ovarian cancer. * Tracking Code: BO * EXAM: CT CHEST WITH CONTRAST TECHNIQUE: Multidetector CT imaging of the chest was performed during intravenous contrast administration. RADIATION DOSE REDUCTION: This exam was performed according to the departmental dose-optimization program which includes automated exposure control, adjustment of the mA and/or kV according to patient size and/or use of iterative reconstruction technique. CONTRAST:  75mL OMNIPAQUE  IOHEXOL  300 MG/ML  SOLN COMPARISON:  CT abdomen pelvis 01/18/2024 and CT chest 10/30/2023. FINDINGS: Cardiovascular: Right IJ  Port-A-Cath terminates in the low SVC. Atherosclerotic calcification of the aorta, aortic valve and coronary arteries. Heart is at the upper limits of normal in size to mildly enlarged. No pericardial effusion. Enlarged pulmonic trunk. Mediastinum/Nodes: No pathologically enlarged mediastinal, hilar or axillary lymph nodes. Esophagus is grossly unremarkable. Lungs/Pleura: Subpleural scarring in the medial right lower lobe with somewhat of a plaque like area superiorly, measuring 0.7 x 2.7 cm (8/109). Finding is unchanged from 01/18/2024 and new from 10/30/2023. Otherwise, no suspicious pulmonary  nodules. No pleural fluid. Airway is unremarkable. Upper Abdomen: Multiple hepatic cysts. Visualized portions of the liver, adrenal glands, kidneys, spleen, pancreas, stomach and bowel are otherwise grossly unremarkable. No upper abdominal adenopathy. Musculoskeletal: Degenerative changes in the spine. No worrisome lytic or sclerotic lesions. IMPRESSION: 1. Subpleural scarring in the medial right lower lobe with somewhat of a plaque-like area superiorly, as on 01/18/2024, new from 10/30/2023. Scarring or loculated pleural fluid is favored. Metastatic disease cannot be excluded. Recommend attention on follow-up. 2. Otherwise, no evidence of metastatic disease in the chest. 3. Aortic atherosclerosis (ICD10-I70.0). Coronary artery calcification. 4. Enlarged pulmonic trunk, indicative of pulmonary arterial hypertension. Electronically Signed   By: Newell Eke M.D.   On: 02/08/2024 14:09   CT ABDOMEN PELVIS W CONTRAST Result Date: 01/24/2024 CLINICAL DATA:  78 year old female with history of ovarian cancer status post chemotherapy. Follow-up study. * Tracking Code: BO * EXAM: CT ABDOMEN AND PELVIS WITH CONTRAST TECHNIQUE: Multidetector CT imaging of the abdomen and pelvis was performed using the standard protocol following bolus administration of intravenous contrast. RADIATION DOSE REDUCTION: This exam was performed  according to the departmental dose-optimization program which includes automated exposure control, adjustment of the mA and/or kV according to patient size and/or use of iterative reconstruction technique. CONTRAST:  80mL OMNIPAQUE  IOHEXOL  300 MG/ML  SOLN COMPARISON:  CT of the abdomen and pelvis 12/07/2023. FINDINGS: Lower chest: 2.8 x 0.7 cm new area of pleural-based nodularity in the right lower lobe (axial image 5 of series 2) concerning for probable metastatic lesion. Hepatobiliary: Multiple well-defined low-attenuation lesions scattered throughout the liver, largest of which are all compatible with simple cysts, measuring up to 4.4 x 2.7 cm in segment 4A. Multiple other subcentimeter low-attenuation lesions, too small to definitively characterize, but similar to the prior study and statistically likely to represent tiny cysts and/or biliary hamartomas. Gallbladder is unremarkable in appearance. Pancreas: No pancreatic mass. No pancreatic ductal dilatation. No pancreatic or peripancreatic fluid collections or inflammatory changes. Spleen: Unremarkable. Adrenals/Urinary Tract: Bilateral kidneys and bilateral adrenal glands are normal in appearance. No hydroureteronephrosis. Urinary bladder is unremarkable in appearance. Stomach/Bowel: Appearance of the stomach is normal. There is no pathologic dilatation of small bowel or colon. Numerous colonic diverticula are noted, without definite focal surrounding inflammatory changes to indicate an acute diverticulitis at this time. Previously noted mass in the sigmoid colon is not readily apparent on today's examination. The appendix is not confidently identified and may be surgically absent. Regardless, there are no inflammatory changes noted adjacent to the cecum to suggest the presence of an acute appendicitis at this time. Vascular/Lymphatic: Atherosclerotic calcifications are noted throughout the abdominal aorta and pelvic vasculature. Retroaortic left renal vein  (normal anatomical variant) incidentally noted. No definite lymphadenopathy confidently identified in the abdomen or pelvis. Reproductive: Status post hysterectomy. Left ovary is unremarkable in appearance. Right adnexal mass has regressed when compared to the prior examination, currently measuring 7.8 x 7.4 x 7.7 cm (axial image 51 of series 2 and coronal image 51 of series 5). Other: Previously noted peritoneal implants in the low pelvis appear to have regressed compared to the prior examination. No new peritoneal implants confidently identified. No significant volume of ascites. No pneumoperitoneum. Musculoskeletal: There are no aggressive appearing lytic or blastic lesions noted in the visualized portions of the skeleton. IMPRESSION: 1. Today's study demonstrates a positive response to therapy with regression of previously noted right adnexal mass and peritoneal implants in the low pelvis. 2. However, there is a new pleural-based nodule  in the right lower lobe concerning for a metastatic lesion. 3. Colonic diverticulosis without evidence of acute diverticulitis at this time. 4. Aortic atherosclerosis. Aortic Atherosclerosis (ICD10-I70.0). Electronically Signed   By: Toribio Aye M.D.   On: 01/24/2024 10:40      02/20/2024 Pathology Results   SURGICAL PATHOLOGY  CASE: [phone]  PATIENT: [facility]  Surgical Pathology Report   Clinical History: ovarian cancer   FINAL MICROSCOPIC DIAGNOSIS:   A. DISTAL RECTAL BIOPSY:  Peritoneum and fibroadipose tissue involved by carcinoma consistent with gynecologic primary.       See comment.   B. SIGMOID COLON BIOPSY:       Fibromuscular tissue with lymphohistiocytic inflammation.       Negative for malignancy.   C. UTERUS, CERVIX, BILATERAL FALLOPIAN TUBES AND OVARIES, ABDOMINAL  HYSTERTCTOMY AND BILATERAL SALPINGO-OOPHORECTOMY:   Right ovary:  Predominant necrosis with rare degenerated glandular architecture and calcifications consistent  with treatment effect.            No residual viable carcinoma identified.            See oncology table.   Cervix:           Unremarkable.           Negative for dysplasia or malignancy.       Endocervix:           Nabothian cysts.           Negative for hyperplasia, atypia or malignancy.       Endometrium:           Benign inactive endometrium.           Negative for hyperplasia, atypia or malignancy.       Myometrium:           Unremarkable.           Negative for malignancy.       Serosa:           Unremarkable.           Negative for malignancy.       Bilateral fallopian tubes:           Benign fimbriated fallopian tubes.           Negative for malignancy.       Left ovary:           Unremarkable.           Negative for malignancy.  D. OMENTUM:      Benign adipose tissue, negative for malignancy.  E. APPENDIX:      Benign vermiform appendix without significant diagnostic alteration.      Negative for malignancy.  ONCOLOGY TABLE:  OVARY or FALLOPIAN TUBE or PRIMARY PERITONEUM: Resection  Procedure: Total hysterectomy and bilateral salpingo-oophorectomy Specimen Integrity: Intact Tumor Site: Right ovary Tumor Size: 9.5 cm necrotic mass without viable carcinoma Histologic Type: Pre-treated diagnosis: high-grade serous carcinoma Histologic Grade: High-grade Ovarian Surface Involvement: NA Fallopian Tube Surface Involvement: Not identified Implants: Not applicable Lymphatic and/or Vascular Invasion:  Not identified Other Tissue/ Organ Involvement: Peritoneum of the pericolorectal soft tissue Largest Extrapelvic Peritoneal Focus: Not applicable Peritoneal/Ascitic Fluid Involvement: Identified ([id number]) Chemotherapy Response Score (CRS): CRS3 (marked response with no residual cancer) Regional Lymph Nodes: Not applicable (no lymph nodes submitted or found)      Number of Nodes with Metastasis Greater than 10 mm: NA      Number of Nodes with Metastasis 10 mm  or Less (excludes isolated tumor  cells): NA  Number of Nodes with Isolated Tumor Cells (0.2 mm or less): NA      Number of Lymph Nodes Examined: 0 Distant Metastasis:      Distant Site(s) Involved: None Pathologic Stage Classification (pTNM, AJCC 8th Edition): ypT2b, ypNx Ancillary Studies: Can be performed upon request Representative Tumor Block: A1 (Peritoneum of the pericolorectal soft tissue) Comment(s): See comment ([id number])  COMMENT:  The right ovary contains a mass lesion with predominant necrosis and rare degenerated glandular architecture with calcifications. The specimen is extensively sampled and there is no residual viable carcinoma identified in the right ovary. However, the distal rectal biopsy (part A) contains foci of crushed cells that are positive for CK AE1/AE3, PAX8, and WT-1 by immunohistochemical stains. P53 mutant type of staining (null). 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 The staging is rendered as T2b duo to peritoneal involvement in the rectum (pelvic extension below pelvic brim).     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  On entry to abdomen, normal upper abdominal survey with smooth diaphragm, liver, stomach and normal appearing omentum  and bowel.  Upon visualization into the pelvis, multiple loops of small bowel adherent to the right adnexal mass, initially occluding view of mass.  Following enterolysis, approximately 8 cm right ovarian mass identified to demonstrate torsion with necrosis, adherent to the right anterior cul-de-sac and bladder serosa.  Otherwise small normal-appearing uterus, left fallopian tube and ovary. Retroperitoneal fibrosis on the right, requiring right ureterolysis. Colon with diverticulosis.  Two approximately 1 cm areas on the sigmoid and rectum (at the peritoneal reflection) with evidence of treatment effect without overt active disease, both areas excised.  Appendix adherent to the necrotic pelvic mass with disruption and serosa on lysis of adhesions, removed.  Small bowel run with no evidence of mesenteric or serosal disease.  Few areas of deserosalization from enterolysis oversewn.  Omentum without overt active disease but few areas of scarring, likely to represent treated disease. Negative bubble study. R0 resection.     02/20/2024 Pathology Results   SURGICAL PATHOLOGY  CASE: [phone]  PATIENT: [facility]  Surgical Pathology Report   Clinical History: ovarian cancer   FINAL MICROSCOPIC DIAGNOSIS:   A. DISTAL RECTAL BIOPSY:  Peritoneum and fibroadipose tissue involved by carcinoma consistent with  gynecologic primary.       See comment.   B. SIGMOID COLON BIOPSY:       Fibromuscular tissue with lymphohistiocytic inflammation.       Negative for malignancy.   C. UTERUS, CERVIX, BILATERAL FALLOPIAN TUBES AND OVARIES, ABDOMINAL  HYSTERTCTOMY AND BILATERAL SALPINGO-OOPHORECTOMY:   Right ovary:  Predominant necrosis with rare degenerated glandular architecture and  calcifications consistent with treatment effect.            No residual viable carcinoma identified.            See  oncology table.   Cervix:           Unremarkable.           Negative for dysplasia or malignancy.        Endocervix:           Nabothian cysts.           Negative for hyperplasia, atypia or malignancy.       Endometrium:           Benign inactive endometrium.           Negative for hyperplasia, atypia or malignancy.       Myometrium:           Unremarkable.           Negative for malignancy.       Serosa:           Unremarkable.           Negative for malignancy.       Bilateral fallopian tubes:           Benign fimbriated fallopian tubes.           Negative for malignancy.       Left ovary:           Unremarkable.           Negative for malignancy.  D. OMENTUM:      Benign adipose tissue, negative for malignancy.  E. APPENDIX:      Benign vermiform appendix without significant diagnostic alteration.      Negative for malignancy.  ONCOLOGY TABLE: OVARY or FALLOPIAN TUBE or PRIMARY PERITONEUM: Resection  Procedure: Total hysterectomy and bilateral salpingo-oophorectomy Specimen Integrity: Intact Tumor Site: Right ovary Tumor Size: 9.5 cm necrotic mass without viable carcinoma Histologic Type: Pre-treated diagnosis: high-grade serous carcinoma Histologic Grade: High-grade Ovarian Surface Involvement: NA Fallopian Tube Surface Involvement: Not identified Implants: Not applicable Lymphatic and/or Vascular Invasion:  Not identified Other Tissue/ Organ Involvement: Peritoneum of the pericolorectal soft tissue Largest Extrapelvic Peritoneal Focus: Not applicable Peritoneal/Ascitic Fluid Involvement: Identified ([id number]) Chemotherapy Response Score (CRS): CRS3 (marked response with no residual cancer) Regional Lymph Nodes: Not applicable (no lymph nodes submitted or found)      Number of Nodes with Metastasis Greater than 10 mm: NA      Number of Nodes with Metastasis 10 mm or Less (excludes isolated tumor cells): NA      Number of Nodes with Isolated Tumor Cells (0.2 mm or less): NA      Number of Lymph Nodes Examined: 0 Distant Metastasis:      Distant Site(s)  Involved: None      03/22/2024 Tumor Marker   Patient's tumor was tested for the following markers: CA-125. Results of the tumor marker test revealed 10.7.   05/03/2024 Tumor Marker   Patient's tumor was tested for the following markers: CA-125. Results of the tumor marker test revealed 9.2.   06/03/2024 Imaging   CT ABDOMEN PELVIS W CONTRAST Result Date: 06/06/2024 CLINICAL DATA:  Follow-up ovarian carcinoma. Previous surgery and chemotherapy. * Tracking Code: BO * EXAM: CT ABDOMEN AND PELVIS WITH CONTRAST TECHNIQUE: Multidetector CT imaging of the abdomen and pelvis was performed using the standard protocol following bolus administration of intravenous contrast. RADIATION DOSE REDUCTION: This exam was performed according to the departmental dose-optimization program which includes automated exposure control, adjustment of the mA and/or kV according to patient size and/or use of iterative reconstruction technique. CONTRAST:  OMNIPAQUE  IOHEXOL  300 MG/ML  SOLN COMPARISON:  01/18/2024 and 02/03/2024 FINDINGS: Lower Chest: No acute findings. Previously seen pleural based nodular density in inferior right hemithorax has resolved since prior study. Hepatobiliary: Multiple hepatic cysts show no significant change since prior exam. No suspicious hepatic masses identified. Gallbladder is unremarkable. No evidence of biliary ductal dilatation. Pancreas:  No mass or inflammatory changes. Spleen: Within normal limits in size and appearance. Adrenals/Urinary Tract: No suspicious masses identified. No evidence of ureteral calculi or hydronephrosis. Mild diffuse bladder wall thickening and mucosal hyperenhancement is seen, consistent with cystitis. Stomach/Bowel: No evidence of obstruction, inflammatory process or abnormal fluid collections. Diverticulosis is seen mainly involving the sigmoid colon, however there is no evidence of diverticulitis. Vascular/Lymphatic: No pathologically enlarged lymph nodes. No  acute vascular findings. Reproductive: Prior hysterectomy noted. Adnexal regions are unremarkable in appearance. No No evidence of peritoneal nodules or ascites. Other:  None. Musculoskeletal: No suspicious bone lesions identified. Severe lumbar spine degenerative changes again IMPRESSION: No evidence of recurrent or metastatic disease within the abdomen or pelvis. Mild diffuse bladder wall thickening and mucosal hyperenhancement, consistent with cystitis. Colonic diverticulosis, without radiographic evidence of diverticulitis. Electronically Signed   By: Norleen DELENA Kil M.D.   On: 06/06/2024 12:52      06/04/2024 Tumor Marker   Patient's tumor was tested for the following markers: CA-125. Results of the tumor marker test revealed 10.1.   06/18/2024 Procedure   Successful removal of a RIGHT chest implanted Port-A-Cath.      Interval History: Patient presents with her son.  She reports that she is overall doing well since completion of chemotherapy in May.  Reports that she is back to her normal weight.  Her son notes that the patient has had a couple of UTIs.  Patient reports that the last antibiotics was approximately 2 months ago and denies issues since then.  They are not certain if she had a urine culture every time but believes she had some sort of testing of the urine and this most recent time.  No issues with neuropathy.  Otherwise denies any new vaginal bleeding, abdominal/pelvic pain, unintentional weight loss, change in bowel habits, early satiety, bloating, nausea/vomiting.    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      Dilated aortic root    Hyperlipidemia    Hypertension    Pt denies   Hypothyroidism    Macular degeneration    Ovarian cancer [facility])     Past Surgical History:  Procedure Laterality Date   ANKLE FRACTURE SURGERY Left    APPENDECTOMY  02/20/2024    Procedure: APPENDECTOMY;  Surgeon: Eldonna Mays, MD;  Location: [facility];  Service: Gynecology;;   DORY LIFT Bilateral 05/17/2022   Procedure: BILATERAL UPPER BLEPHAROPLASTY WITH PTOSIS REPAIR;  Surgeon: Marene Sieving, MD;  Location: [facility] OR;  Service: Plastics;  Laterality: Bilateral;  1.5 hours   BUNIONECTOMY Left    COLONOSCOPY     EYE SURGERY     FLEXIBLE SIGMOIDOSCOPY N/A 11/02/2023   Procedure: FLEXIBLE SIGMOIDOSCOPY;  Surgeon: Saintclair Jasper, MD;  Location[facility];  Service: Gastroenterology;  Laterality: N/A;   IR IMAGING GUIDED PORT INSERTION  11/01/2023   IR REMOVAL TUN ACCESS W/ PORT W/O FL MOD SED  06/18/2024   LAPAROSCOPY N/A 11/08/2023   Procedure: LAPAROSCOPY DIAGNOSTIC WITH BIOPSY;  Surgeon: Viktoria Comer SAUNDERS, MD;  Location: [facility];  Service: Gynecology;  Laterality: N/A;  REFRACTIVE SURGERY     laser for macular degeneration   ROBOTIC ASSISTED TOTAL HYSTERECTOMY WITH BILATERAL SALPINGO OOPHERECTOMY Bilateral 02/20/2024   Procedure: XI ROBOTIC ASSISTED TOTAL HYSTERECTOMY WITH BILATERAL SALPINGO OOPHORECTOMY, OMENTECTOMY, DEBULKING, ENTEROLYSIS;  Surgeon: Eldonna Mays, MD;  Location: [facility];  Service: Gynecology;  Laterality: Bilateral;   SHOULDER ARTHROSCOPY     TONSILLECTOMY      Family History  Problem Relation Age of Onset   Lung cancer Mother 49 - 51       smoked   Stroke Mother 38   Alzheimer's disease Father    CAD Father        95% occlusion of L Main & RCA, severe descending aorta, arterial & arteriolonephrosclerosis    Cholecystitis Father    Esophagitis Father    Cancer Brother 38 - 38       unknown type, agent orange exposure   Pancreatic cancer Daughter 82   BRCA 1/2 Neg Hx    Breast cancer Neg Hx    Colon cancer Neg Hx    Ovarian cancer Neg Hx    Endometrial cancer Neg Hx    Prostate cancer Neg Hx     Social History   Socioeconomic History   Marital status: Widowed    Spouse name: Not on file   Number of children: 2   Years of  education: Not on file   Highest education level: Not on file  Occupational History   Not on file  Tobacco Use   Smoking status: Former    Current packs/day: 0.00    Types: Cigarettes    Quit date: 05/15/1988    Years since quitting: 36.3   Smokeless tobacco: Never  Vaping Use   Vaping status: Never Used  Substance and Sexual Activity   Alcohol use: Yes    Alcohol/week: 14.0 standard drinks of alcohol    Types: 14 drink(s) per week    Comment: white wine   Drug use: No   Sexual activity: Not Currently    Partners: Male  Other Topics Concern   Not on file  Social History Narrative   Not on file   Social Drivers of Health   Financial Resource Strain: Not on file  Food Insecurity: No Food Insecurity (02/21/2024)   Hunger Vital Sign    Worried About Running Out of Food in the Last Year: Never true    Ran Out of Food in the Last Year: Never true  Transportation Needs: Unknown (02/21/2024)   PRAPARE - Transportation    Lack of Transportation (Medical): No    Lack of Transportation (Non-Medical): Patient declined  Physical Activity: Not on file  Stress: Not on file  Social Connections: Socially Isolated (02/21/2024)   Social Connection and Isolation Panel    Frequency of Communication with Friends and Family: Three times a week    Frequency of Social Gatherings with Friends and Family: Twice a week    Attends Religious Services: Never    Database administrator or Organizations: No    Attends Banker Meetings: Never    Marital Status: Widowed    Current Medications:  Current Outpatient Medications:    atorvastatin  (LIPITOR) 40 MG tablet, Take 40 mg by mouth at bedtime., Disp: , Rfl:    Cholecalciferol (VITAMIN D) 50 MCG (2000 UT) tablet, Take 2,000 Units by mouth daily., Disp: , Rfl:    doxylamine , Sleep, (UNISOM ) 25 MG tablet, Take 25 mg by mouth at bedtime as needed for sleep., Disp: ,  Rfl:    Ensure (ENSURE), Take 1 Can by mouth 3 (three) times daily as  needed (lack of appetite)., Disp: , Rfl:    lactulose  (CHRONULAC ) 10 GM/15ML solution, Take 10.5 mLs (7 g total) by mouth 3 (three) times daily as needed for mild constipation., Disp: 236 mL, Rfl: 0   levothyroxine  (SYNTHROID ) 75 MCG tablet, Take 75 mcg by mouth daily before breakfast., Disp: , Rfl:    Nutritional Supplements (NUTRITIONAL SHAKE) LIQD, Take 1 Can by mouth 3 (three) times daily as needed (lack of appetite)., Disp: , Rfl:    polyethylene glycol (MIRALAX  / GLYCOLAX ) 17 g packet, Take 17 g by mouth 2 (two) times daily as needed for mild constipation., Disp: 14 each, Rfl: 0   dexamethasone  (DECADRON ) 4 MG tablet, Take 2 tabs at the night before and 2 tab the morning of chemotherapy, every 3 weeks, by mouth x 6 cycles (Patient not taking: Reported on 09/19/2024), Disp: 24 tablet, Rfl: 6  Review of Symptoms: Complete 10-system review is negative except as above in Interval History.  Physical Exam: BP (!) 101/54 (BP Location: Left Arm, Patient Position: Sitting)   Pulse 94   Temp 98.7 F (37.1 C) (Oral)   Resp 18   Wt 110 lb (49.9 kg)   SpO2 98%   BMI 20.78 kg/m  General: Alert, oriented, no acute distress. HEENT: Normocephalic, atraumatic. Neck symmetric without masses. Sclera anicteric.  Chest: Normal work of breathing. Clear to auscultation bilaterally.   Cardiovascular: Regular rate and rhythm, no murmurs. Abdomen: Soft, nontender.  Normoactive bowel sounds.  No masses appreciated.  Well-healed incisions. Extremities: Grossly normal range of motion.  Warm, well perfused.  No edema bilaterally. Skin: No rashes or lesions noted. Lymphatics: No cervical, supraclavicular, or inguinal adenopathy. GU: Normal appearing external genitalia without erythema, excoriation, or lesions.  Speculum exam reveals normal vaginal mucosa with small narrowing pouch at the midline vaginal apex which may be due to how the cuff healed.  Bimanual exam reveals fold of tissue at cuff posteriorly at  midline at apex which correlates with visual findings. No nodularity otherwise or pelvic mass.  Exam chaperoned by Kimberly [place], CMA   Laboratory & Radiologic Studies: Lab Results  Component Value Date   CAN125 10.1 06/03/2024   CAN125 9.2 05/02/2024   CAN125 10.7 03/21/2024   CEA1 3.5 10/31/2023     CT ABDOMEN PELVIS W CONTRAST 06/03/2024  Narrative CLINICAL DATA:  Follow-up ovarian carcinoma. Previous surgery and chemotherapy. * Tracking Code: BO *  EXAM: CT ABDOMEN AND PELVIS WITH CONTRAST  TECHNIQUE: Multidetector CT imaging of the abdomen and pelvis was performed using the standard protocol following bolus administration of intravenous contrast.  RADIATION DOSE REDUCTION: This exam was performed according to the departmental dose-optimization program which includes automated exposure control, adjustment of the mA and/or kV according to patient size and/or use of iterative reconstruction technique.  CONTRAST:  OMNIPAQUE  IOHEXOL  300 MG/ML  SOLN  COMPARISON:  01/18/2024 and 02/03/2024  FINDINGS: Lower Chest: No acute findings. Previously seen pleural based nodular density in inferior right hemithorax has resolved since prior study.  Hepatobiliary: Multiple hepatic cysts show no significant change since prior exam. No suspicious hepatic masses identified. Gallbladder is unremarkable. No evidence of biliary ductal dilatation.  Pancreas:  No mass or inflammatory changes.  Spleen: Within normal limits in size and appearance.  Adrenals/Urinary Tract: No suspicious masses identified. No evidence of ureteral calculi or hydronephrosis. Mild diffuse bladder wall thickening and mucosal hyperenhancement is seen,  consistent with cystitis.  Stomach/Bowel: No evidence of obstruction, inflammatory process or abnormal fluid collections. Diverticulosis is seen mainly involving the sigmoid colon, however there is no evidence of diverticulitis.  Vascular/Lymphatic:  No pathologically enlarged lymph nodes. No acute vascular findings.  Reproductive: Prior hysterectomy noted. Adnexal regions are unremarkable in appearance. No No evidence of peritoneal nodules or ascites.  Other:  None.  Musculoskeletal: No suspicious bone lesions identified. Severe lumbar spine degenerative changes again  IMPRESSION: No evidence of recurrent or metastatic disease within the abdomen or pelvis.  Mild diffuse bladder wall thickening and mucosal hyperenhancement, consistent with cystitis.  Colonic diverticulosis, without radiographic evidence of diverticulitis.   Electronically Signed By: Norleen DELENA Kil M.D. On: 06/06/2024 12:52

## 2024-09-24 ENCOUNTER — Telehealth: Payer: Self-pay

## 2024-09-24 ENCOUNTER — Telehealth: Payer: Self-pay | Admitting: Psychiatry

## 2024-09-24 LAB — CA 125: Cancer Antigen (CA) 125: 40.5 U/mL — ABNORMAL HIGH (ref 0.0–38.1)

## 2024-09-24 NOTE — Telephone Encounter (Signed)
 Called son and given radiology scheduling #. He will call to schedule CT for next week.

## 2024-09-24 NOTE — Telephone Encounter (Signed)
 Called pt with CA125 results. Will move up CT scan. Pt aware. All questions answered.

## 2024-09-24 NOTE — Telephone Encounter (Signed)
 Called son back. Schedule lab appt prior to CT and appt with Dr. Rick. He is aware of appts.

## 2024-10-01 DIAGNOSIS — H52203 Unspecified astigmatism, bilateral: Secondary | ICD-10-CM | POA: Diagnosis not present

## 2024-10-01 DIAGNOSIS — H353132 Nonexudative age-related macular degeneration, bilateral, intermediate dry stage: Secondary | ICD-10-CM | POA: Diagnosis not present

## 2024-10-01 DIAGNOSIS — H26491 Other secondary cataract, right eye: Secondary | ICD-10-CM | POA: Diagnosis not present

## 2024-10-02 ENCOUNTER — Inpatient Hospital Stay

## 2024-10-02 ENCOUNTER — Ambulatory Visit (HOSPITAL_COMMUNITY)
Admission: RE | Admit: 2024-10-02 | Discharge: 2024-10-02 | Disposition: A | Discharge status: Home / Self Care | Source: Ambulatory Visit | Attending: Hematology and Oncology | Admitting: Hematology and Oncology

## 2024-10-02 DIAGNOSIS — C561 Malignant neoplasm of right ovary: Secondary | ICD-10-CM | POA: Insufficient documentation

## 2024-10-02 DIAGNOSIS — I700 Atherosclerosis of aorta: Secondary | ICD-10-CM | POA: Diagnosis not present

## 2024-10-02 DIAGNOSIS — Z9221 Personal history of antineoplastic chemotherapy: Secondary | ICD-10-CM | POA: Diagnosis not present

## 2024-10-02 DIAGNOSIS — C569 Malignant neoplasm of unspecified ovary: Secondary | ICD-10-CM | POA: Diagnosis not present

## 2024-10-02 DIAGNOSIS — K7689 Other specified diseases of liver: Secondary | ICD-10-CM | POA: Diagnosis not present

## 2024-10-02 LAB — COMPREHENSIVE METABOLIC PANEL WITH GFR
ALT: 14 U/L (ref 0–44)
AST: 26 U/L (ref 15–41)
Albumin: 4.3 g/dL (ref 3.5–5.0)
Alkaline Phosphatase: 88 U/L (ref 38–126)
Anion gap: 3 — ABNORMAL LOW (ref 5–15)
BUN: 21 mg/dL (ref 8–23)
CO2: 32 mmol/L (ref 22–32)
Calcium: 10.3 mg/dL (ref 8.9–10.3)
Chloride: 103 mmol/L (ref 98–111)
Creatinine, Ser: 0.74 mg/dL (ref 0.44–1.00)
GFR, Estimated: >60 mL/min (ref >60)
Glucose, Bld: 123 mg/dL — ABNORMAL HIGH (ref 70–99)
Potassium: 5.2 mmol/L — ABNORMAL HIGH (ref 3.5–5.1)
Sodium: 138 mmol/L (ref 135–145)
Total Bilirubin: 0.6 mg/dL (ref 0.0–1.2)
Total Protein: 7.1 g/dL (ref 6.5–8.1)

## 2024-10-02 LAB — CBC WITH DIFFERENTIAL/PLATELET
Abs Immature Granulocytes: 0.02 K/uL (ref 0.00–0.07)
Basophils Absolute: 0.1 K/uL (ref 0.0–0.1)
Basophils Relative: 1 %
Eosinophils Absolute: 0.1 K/uL (ref 0.0–0.5)
Eosinophils Relative: 2 %
HCT: 46.6 % — ABNORMAL HIGH (ref 36.0–46.0)
Hemoglobin: 15.4 g/dL — ABNORMAL HIGH (ref 12.0–15.0)
Immature Granulocytes: 0 %
Lymphocytes Relative: 13 %
Lymphs Abs: 0.8 K/uL (ref 0.7–4.0)
MCH: 29.5 pg (ref 26.0–34.0)
MCHC: 33 g/dL (ref 30.0–36.0)
MCV: 89.3 fL (ref 80.0–100.0)
Monocytes Absolute: 0.8 K/uL (ref 0.1–1.0)
Monocytes Relative: 12 %
Neutro Abs: 4.7 K/uL (ref 1.7–7.7)
Neutrophils Relative %: 72 %
Platelets: 177 K/uL (ref 150–400)
RBC: 5.22 MIL/uL — ABNORMAL HIGH (ref 3.87–5.11)
RDW: 13.8 % (ref 11.5–15.5)
WBC: 6.5 K/uL (ref 4.0–10.5)
nRBC: 0 % (ref 0.0–0.2)

## 2024-10-02 MED ORDER — IOHEXOL 300 MG/ML  SOLN
100.0000 mL | Freq: Once | INTRAMUSCULAR | Status: AC | PRN
  Administered 2024-10-02: 100 mL via INTRAVENOUS

## 2024-10-03 LAB — CA 125: Cancer Antigen (CA) 125: 44.6 U/mL — ABNORMAL HIGH (ref 0.0–38.1)

## 2024-10-07 ENCOUNTER — Telehealth: Payer: Self-pay

## 2024-10-07 ENCOUNTER — Inpatient Hospital Stay: Admitting: Hematology and Oncology

## 2024-10-07 ENCOUNTER — Encounter: Payer: Self-pay | Admitting: Hematology and Oncology

## 2024-10-07 VITALS — BP 118/77 | HR 92 | Temp 99.3°F | Resp 18 | Ht 61.0 in | Wt 110.6 lb

## 2024-10-07 DIAGNOSIS — Z9079 Acquired absence of other genital organ(s): Secondary | ICD-10-CM | POA: Diagnosis not present

## 2024-10-07 DIAGNOSIS — Z90722 Acquired absence of ovaries, bilateral: Secondary | ICD-10-CM | POA: Diagnosis not present

## 2024-10-07 DIAGNOSIS — R4189 Other symptoms and signs involving cognitive functions and awareness: Secondary | ICD-10-CM | POA: Diagnosis not present

## 2024-10-07 DIAGNOSIS — Z9221 Personal history of antineoplastic chemotherapy: Secondary | ICD-10-CM | POA: Diagnosis not present

## 2024-10-07 DIAGNOSIS — Z90710 Acquired absence of both cervix and uterus: Secondary | ICD-10-CM | POA: Diagnosis not present

## 2024-10-07 DIAGNOSIS — Z87440 Personal history of urinary (tract) infections: Secondary | ICD-10-CM | POA: Diagnosis not present

## 2024-10-07 DIAGNOSIS — G3184 Mild cognitive impairment, so stated: Secondary | ICD-10-CM | POA: Insufficient documentation

## 2024-10-07 DIAGNOSIS — I714 Abdominal aortic aneurysm, without rupture, unspecified: Secondary | ICD-10-CM

## 2024-10-07 DIAGNOSIS — C561 Malignant neoplasm of right ovary: Secondary | ICD-10-CM | POA: Diagnosis not present

## 2024-10-07 NOTE — Assessment & Plan Note (Addendum)
 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 The intent is palliative.  J Clin Oncol. 2014 May 1;32(13):1302-8. doi: 10.1200/JCO.2013.51.4489. Epub 2014 Mar 17. Bevacizumab  combined with chemotherapy for platinum-resistant recurrent ovarian cancer: The AURELIA open-label randomized phase III trial. Pujade-Lauraine E1, Hilpert F, Weber B, Reuss A, [place] A, Kristensen G, Sorio R, [place] I, [place], Bamias A, [place] D, Wimberger P, [place] A, Mirza MR, [place], [place] D, Ray-Coquard I. Chartered Loss Adjuster information 1 [employer], Group d'Investigateurs Nationaux pour [employer] (GINECO) and [employer], Assistance [employer], [place]; Batrice Weber, GINECO and [employer], [place]; Serge Doughty, GINECO and [place], [address]; [place] Ray-Coquard, GINECO and [employer], [place], [place]; [place], Arbeitsgemeinschaft Gynkologische Onkologie (AGO) and [employer] fr Gynkologie und [place], [place]; Marsa Meyers, AGO and [employer] for [employer], [place]; Landon Palin, AGO and [place], [place], [place]; Gustav Kilts, Grupo Espaol de Educational Psychologist de [place] (GEICO) and [employer] de [place], [place]; [place], GEICO and [facility], [place], [place]; Gunnar  Kristensen, Nordic Society of Gynaecological Oncology (NSGO) and [facility], [place], [place]; Germain Peel, Land Italian Trials in Marine Scientist and [employer] e Cura a Sports Coach, [place], [place]; Gordon, Belgian Gynaecological Oncology Group and [facility] [place], [place], [place]; Yevonne Conners, Dutch Gynecological Oncology Group and [facility] [place], [place], the [place]; Medical Sales Representative, [employer] and [place] of [place], [place], [place]; [address], GINECO and [address] [place] do [place], [place], [place]; Mansoor [place], NSGO and [place], [place], [place]; and [place], [place]. Hoffmann[employer] Roche, [place], [place].  Erratum in J Clin Oncol. 2014 Dec 10;32(35):4025.  Abstract PURPOSE:  In platinum-resistant ovarian cancer (OC), single-agent chemotherapy is standard. Bevacizumab  is active alone and in combination. AURELIA is the first randomized phase III trial to our knowledge combining bevacizumab  with chemotherapy in platinum-resistant OC. PATIENTS AND METHODS:  Eligible patients had measurable/assessable OC that had progressed < 6 months after completing platinum-based therapy. Patients with refractory disease, history of bowel obstruction, or > two prior anticancer regimens were ineligible. After investigators selected chemotherapy (pegylated liposomal doxorubicin, weekly paclitaxel , or topotecan), patients were randomly assigned to single-agent chemotherapy alone or with bevacizumab  (10 mg/kg every 2 weeks or 15 mg/kg every 3 weeks) until progression, unacceptable toxicity, or consent withdrawal. Crossover to single-agent bevacizumab  was permitted after progression with chemotherapy alone. The primary end point was progression-free survival (PFS) by RECIST. Secondary end points included objective response rate (ORR), overall survival (OS), safety, and  patient-reported outcomes. RESULTS:  The PFS hazard ratio (HR) after PFS events in 301 of 361 patients was 0.48 (95% CI, 0.38 to 0.60; unstratified log-rank P < .001). Median PFS was 3.4 months with chemotherapy alone versus 6.7 months with bevacizumab -containing therapy. RECIST ORR was 11.8% versus 27.3%, respectively (P = .001). The OS HR was 0.85 (95% CI, 0.66 to 1.08; P < .174; median OS, 13.3 v 16.6 months, respectively). Grade >= 2 hypertension and proteinuria  were more common with bevacizumab . GI perforation occurred in 2.2% of bevacizumab -treated patients. CONCLUSION:  Adding bevacizumab  to chemotherapy statistically significantly improved PFS and ORR; the OS trend was not significant. No new safety signals were observed.  We discussed some of the risks, benefits and side-effects of  Doxil and Avastin   Some of the short term side-effects included, though not limited to, risk of fatigue, weight loss, tumor lysis syndrome, risk of allergic reactions, pancytopenia, life-threatening infections, need for transfusions of blood products, nausea, vomiting, change in bowel habits, hair loss, risk of congestive heart failure, admission to hospital for various reasons, and risks of death.   Long term side-effects are also discussed including permanent damage to nerve function, chronic fatigue, and rare secondary malignancy including bone marrow disorders.   The patient is aware that the response rates discussed earlier is not guaranteed.    After a long discussion, patient made an informed decision to proceed with the prescribed plan of care.   Patient education material was dispensed Her family has concern for metastatic disease elsewhere I will order CT imaging of the chest and CT imaging of the head I recommend port placement and echocardiogram We will omit bevacizumab  for 6 weeks after port placement I will see her next month prior to cycle 1 of treatment

## 2024-10-07 NOTE — Assessment & Plan Note (Addendum)
 Her son mention cognitive decline She is being referred to see neurologist I will order CT imaging of the head to screen for metastatic disease

## 2024-10-07 NOTE — Assessment & Plan Note (Signed)
 Will order echocardiogram before treatment

## 2024-10-07 NOTE — Telephone Encounter (Signed)
 Called son and given appt for echo on 11/5. He is aware of appt date/time.

## 2024-10-07 NOTE — Progress Notes (Signed)
 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 The intent is palliative.  J Clin Oncol. 2014 May 1;32(13):1302-8. doi: 10.1200/JCO.2013.51.4489. Epub 2014 Mar 17. Bevacizumab  combined with chemotherapy for platinum-resistant recurrent ovarian cancer: The AURELIA open-label randomized phase III trial. Pujade-Lauraine E1, Hilpert F, Weber B, Reuss A, Poveda A, Kristensen G, Sorio R, Vergote I, [place], Bamias A, Gentry D, Wimberger P, Oaknin A, Mirza MR, [place], Bollag D, Ray-Coquard I. Chartered Loss Adjuster information 1 [employer], Group d'Investigateurs Nationaux pour [employer]ant Ovariens (GINECO) and [employer]it [employer], Assistance Publique-[place] de [place], [place]; Batrice Weber, GINECO and [employer], [place]; Serge Doughty, GINECO and [place], [address]; [place] Ray-Coquard, GINECO and [employer], [place], [place]; [place], Arbeitsgemeinschaft Gynkologische Onkologie (AGO) and At[employer] fr Gynkologie und [place], [place]; Marsa Meyers, AGO and [employer] for [employer], [place]; Landon Palin, AGO and  [place] of [place], [place], [place]; Gustav Kilts, Grupo Espaol de Educational Psychologist de Ovario (GEICO) and [employer] Energy de [place], [place]; [place], GEICO and [facility], [place], [place]; Gunnar Kristensen, Nordic Society of Gynaecological Oncology (NSGO) and [facility], [place], [place]; Germain Peel, Land Italian Trials in Marine Scientist and [employer] e Cura a Sports Coach, [place], [place]; [place], Belgian Gynaecological Oncology Group and [facility], [place], [place]; Yevonne Conners, Dutch Gynecological Oncology Group and [facility], [place], the [place]; Medical Sales Representative, [employer] and [place] of [place], [place], [place]; [address], GINECO and [address] de [place] do [place], [place], [place]; [place], NSGO and [place], [place], [place]; and [place], [place]. Hoffmann[employer]La Roche, [place], [place].  Erratum in J Clin Oncol. 2014 Dec 10;32(35):4025.  Abstract PURPOSE:  In platinum-resistant ovarian cancer (OC), single-agent chemotherapy is standard. Bevacizumab  is active alone and in combination. AURELIA is the first randomized phase III trial to our knowledge combining bevacizumab  with chemotherapy in platinum-resistant OC. PATIENTS AND METHODS:  Eligible patients had measurable/assessable OC that had progressed < 6 months after completing platinum-based therapy. Patients with refractory disease, history of bowel obstruction, or > two prior anticancer regimens were ineligible. After investigators selected chemotherapy (pegylated liposomal doxorubicin, weekly paclitaxel , or topotecan), patients were randomly assigned to single-agent chemotherapy alone or with bevacizumab  (10 mg/kg every 2 weeks or 15 mg/kg every 3 weeks) until progression, unacceptable toxicity, or consent withdrawal. Crossover  to single-agent bevacizumab  was permitted after progression with chemotherapy alone. The primary end point was progression-free survival (PFS) by RECIST. Secondary end points included objective response rate (ORR), overall survival (OS), safety, and patient-reported outcomes. RESULTS:  The PFS hazard ratio (HR) after PFS events in 301 of 361 patients was 0.48 (95% CI, 0.38 to 0.60; unstratified log-rank P < .001). Median PFS was 3.4 months with chemotherapy alone versus 6.7 months  with bevacizumab -containing therapy. RECIST ORR was 11.8% versus 27.3%, respectively (P = .001). The OS HR was 0.85 (95% CI, 0.66 to 1.08; P < .174; median OS, 13.3 v 16.6 months, respectively). Grade >= 2 hypertension and proteinuria were more common with bevacizumab . GI perforation occurred in 2.2% of bevacizumab -treated patients. CONCLUSION:  Adding bevacizumab  to chemotherapy statistically significantly improved PFS and ORR; the OS trend was not significant. No new safety signals were observed.  We discussed some of the risks, benefits and side-effects of  Doxil and Avastin   Some of the short term side-effects included, though not limited to, risk of fatigue, weight loss, tumor lysis syndrome, risk of allergic reactions, pancytopenia, life-threatening infections, need for transfusions of blood products, nausea, vomiting, change in bowel habits, hair loss, risk of congestive heart failure, admission to hospital for various reasons, and risks of death.   Long term side-effects are also discussed including permanent damage to nerve function, chronic fatigue, and rare secondary malignancy including bone marrow disorders.   The patient is aware that the response rates discussed earlier is not guaranteed.    After a long discussion, patient made an informed decision to proceed with the prescribed plan of care.   Patient education material was dispensed Her family has concern for metastatic disease elsewhere I will order CT  imaging of the chest and CT imaging of the head I recommend port placement and echocardiogram We will omit bevacizumab  for 6 weeks after port placement I will see her next month prior to cycle 1 of treatment Abdominal aortic aneurysm (AAA) without rupture, unspecified part Will order echocardiogram before treatment Cognitive decline Her son mention cognitive decline She is being referred to see neurologist I will order CT imaging of the head to screen for metastatic disease  Orders Placed This Encounter  Procedures   CT HEAD W & WO CONTRAST ( )    Standing Status:   Future    Expected Date:   10/14/2024    Expiration Date:   10/07/2025    If indicated for the ordered procedure, I authorize the administration of contrast media per Radiology protocol:   Yes    Does the patient have a contrast media/X-ray dye allergy?:   No    Preferred imaging location?:   [facility]   CT Chest W Contrast    Standing Status:   Future    Expected Date:   10/14/2024    Expiration Date:   10/07/2025    If indicated for the ordered procedure, I authorize the administration of contrast media per Radiology protocol:   Yes    Does the patient have a contrast media/X-ray dye allergy?:   No    Preferred imaging location?:   [facility]   IR IMAGING GUIDED PORT INSERTION    Standing Status:   Future    Expected Date:   10/14/2024    Expiration Date:   10/07/2025    Reason for Exam (SYMPTOM  OR DIAGNOSIS REQUIRED):   need port for chemo    Preferred Imaging Location?:   [facility]   CBC with Differential (Cancer Center Only)    Standing Status:   Future    Expected Date:   12/13/2024    Expiration Date:   12/13/2025   CMP (Cancer Center only)    Standing Status:   Future    Expected Date:   12/13/2024    Expiration Date:   12/13/2025   Total Protein, Urine dipstick  Standing Status:   Future    Expected Date:   12/13/2024    Expiration Date:   12/13/2025   Total Protein, Urine  dipstick    Standing Status:   Future    Expected Date:   11/15/2024    Expiration Date:   11/15/2025   CBC with Differential (Cancer Center Only)    Standing Status:   Future    Expected Date:   11/15/2024    Expiration Date:   11/15/2025   CMP (Cancer Center only)    Standing Status:   Future    Expected Date:   11/15/2024    Expiration Date:   11/15/2025   CBC with Differential (Cancer Center Only)    Standing Status:   Future    Expected Date:   10/18/2024    Expiration Date:   10/18/2025   CMP (Cancer Center only)    Standing Status:   Future    Expected Date:   10/18/2024    Expiration Date:   10/18/2025   CBC with Differential (Cancer Center Only)    Standing Status:   Future    Expected Date:   01/10/2025    Expiration Date:   01/10/2026   CMP (Cancer Center only)    Standing Status:   Future    Expected Date:   01/10/2025    Expiration Date:   01/10/2026   CBC with Differential (Cancer Center Only)    Standing Status:   Future    Expected Date:   02/07/2025    Expiration Date:   02/07/2026   CMP (Cancer Center only)    Standing Status:   Future    Expected Date:   02/07/2025    Expiration Date:   02/07/2026   CBC with Differential (Cancer Center Only)    Standing Status:   Future    Expected Date:   03/07/2025    Expiration Date:   03/07/2026   CMP (Cancer Center only)    Standing Status:   Future    Expected Date:   03/07/2025    Expiration Date:   03/07/2026   Total Protein, Urine dipstick    Standing Status:   Future    Expected Date:   03/07/2025    Expiration Date:   03/07/2026   CBC with Differential (Cancer Center Only)    Standing Status:   Future    Expected Date:   04/04/2025    Expiration Date:   04/04/2026   CBC with Differential (Cancer Center Only)    Standing Status:   Future    Expected Date:   04/25/2025    Expiration Date:   04/25/2026   CBC with Differential (Cancer Center Only)    Standing Status:   Future    Expected Date:   05/16/2025    Expiration Date:    05/16/2026   Total Protein, Urine dipstick    Standing Status:   Future    Expected Date:   05/16/2025    Expiration Date:   05/16/2026   CBC with Differential (Cancer Center Only)    Standing Status:   Future    Expected Date:   06/06/2025    Expiration Date:   06/06/2026   CBC with Differential (Cancer Center Only)    Standing Status:   Future    Expected Date:   06/27/2025    Expiration Date:   06/27/2026   CBC with Differential (Cancer Center Only)    Standing Status:   Future  Expected Date:   07/18/2025    Expiration Date:   07/18/2026   Total Protein, Urine dipstick    Standing Status:   Future    Expected Date:   07/18/2025    Expiration Date:   07/18/2026   ECHOCARDIOGRAM COMPLETE    Standing Status:   Future    Expected Date:   10/14/2024    Expiration Date:   10/07/2025    Perflutren DEFINITY (image enhancing agent) should be administered unless hypersensitivity or allergy exist:   Administer Perflutren    Reason for exam-Echo:   Ascending aortic aneurysm I71    [facility]    Does the patient weigh less than or greater than 250 lbs?:   Patient weighs less than 250 lbs     [name], MD  INTERVAL HISTORY: she returns for surveillance follow-up review of imaging result She is here accompanied by her friend and family Her son noticed that the patient have some mild cognitive decline She has no abdominal pain no changes in bowel habits I reviewed blood work and imaging studies with him  PHYSICAL EXAMINATION: ECOG PERFORMANCE STATUS: 1 - Symptomatic but completely ambulatory  Vitals:   10/07/24 1242  BP: 118/77  Pulse: 92  Resp: 18  Temp: 99.3 F (37.4 C)  SpO2: 97%   Filed Weights   10/07/24 1242  Weight: 110 lb 9.6 oz (50.2 kg)    Relevant data reviewed during this visit included blood work, CT imaging

## 2024-10-08 ENCOUNTER — Encounter: Payer: Self-pay | Admitting: Hematology and Oncology

## 2024-10-08 ENCOUNTER — Other Ambulatory Visit: Payer: Self-pay

## 2024-10-08 ENCOUNTER — Other Ambulatory Visit: Payer: Self-pay | Admitting: Radiology

## 2024-10-08 DIAGNOSIS — H353221 Exudative age-related macular degeneration, left eye, with active choroidal neovascularization: Secondary | ICD-10-CM | POA: Diagnosis not present

## 2024-10-08 NOTE — H&P (Signed)
 Chief Complaint: Recurrent ovarian cancer; referred for port a cath placement to assist with treatment  Referring Provider(s): Gorsuch,N  Supervising Physician: Philip Cornet  Patient Status: [facility] - Out-pt  History of Present Illness: Lori Conrad is a 78 y.o. female with PMH sig for arthritis, AAA, HLD, HTN, hypothyroidism, macular degeneration, cognitive decline and ovarian cancer initially diagnosed in 2014 ,status post neoadjuvant chemotherapy followed by optimal debulking surgery in March 2025 . Repeat CT imaging of the abdomen and pelvis now shows signs of recurrent disease. She is scheduled today for port a cath placement to assist with treatment.     *** Patient is Full Code  Past Medical History:  Diagnosis Date   Arthritis    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   CHEK2 I157T--low penetrance varaint      Dilated aortic root    Hyperlipidemia    Hypertension    Pt denies   Hypothyroidism    Macular degeneration    Ovarian cancer [facility])     Past Surgical History:  Procedure Laterality Date   ANKLE FRACTURE SURGERY Left    APPENDECTOMY  02/20/2024   Procedure: APPENDECTOMY;  Surgeon: Eldonna Mays, MD;  Location: [facility] ORS;  Service: Gynecology;;   DORY LIFT Bilateral 05/17/2022   Procedure: BILATERAL UPPER BLEPHAROPLASTY WITH PTOSIS REPAIR;  Surgeon: Marene Sieving, MD;  Location: [facility] OR;  Service: Plastics;  Laterality: Bilateral;  1.5 hours   BUNIONECTOMY Left    COLONOSCOPY     EYE SURGERY     FLEXIBLE SIGMOIDOSCOPY N/A 11/02/2023   Procedure: FLEXIBLE SIGMOIDOSCOPY;  Surgeon: Saintclair Jasper, MD;  Location: [facility] ENDOSCOPY;  Service: Gastroenterology;  Laterality: N/A;   IR IMAGING GUIDED PORT INSERTION  11/01/2023   IR REMOVAL TUN ACCESS W/ PORT W/O FL MOD SED  06/18/2024   LAPAROSCOPY N/A 11/08/2023   Procedure: LAPAROSCOPY DIAGNOSTIC WITH BIOPSY;  Surgeon: Viktoria Comer SAUNDERS, MD;  Location: [facility];  Service: Gynecology;  Laterality: N/A;   REFRACTIVE SURGERY     laser  for macular degeneration   ROBOTIC ASSISTED TOTAL HYSTERECTOMY WITH BILATERAL SALPINGO OOPHERECTOMY Bilateral 02/20/2024   Procedure: XI ROBOTIC ASSISTED TOTAL HYSTERECTOMY WITH BILATERAL SALPINGO OOPHORECTOMY, OMENTECTOMY, DEBULKING, ENTEROLYSIS;  Surgeon: Eldonna Mays, MD;  Location: [facility];  Service: Gynecology;  Laterality: Bilateral;   SHOULDER ARTHROSCOPY     TONSILLECTOMY      Allergies: Patient has no known allergies.  Medications: Prior to Admission medications   Medication Sig Start Date End Date Taking? Authorizing Provider  atorvastatin  (LIPITOR) 40 MG tablet Take 40 mg by mouth at bedtime.    [provider]  Cholecalciferol (VITAMIN D) 50 MCG (2000 UT) tablet Take 2,000 Units by mouth daily.    [provider]  doxylamine , Sleep, (UNISOM ) 25 MG tablet Take 25 mg by mouth at bedtime as needed for sleep.    [provider]  Ensure (ENSURE) Take 1 Can by mouth 3 (three) times daily as needed (lack of appetite).    [provider]  lactulose  (CHRONULAC ) 10 GM/15ML solution Take 10.5 mLs (7 g total) by mouth 3 (three) times daily as needed for mild constipation. 12/21/23   Lonn Hicks, MD  levothyroxine  (SYNTHROID ) 75 MCG tablet Take 75 mcg by mouth daily before breakfast.    [provider]  lidocaine -prilocaine  (EMLA ) cream Apply 1 Application topically as needed.    [provider]  Nutritional Supplements (NUTRITIONAL SHAKE) LIQD Take 1 Can by mouth 3 (three) times daily as needed (lack of appetite).  [provider]  ondansetron  (ZOFRAN ) 8 MG tablet Take by mouth every 8 (eight) hours as needed for nausea or vomiting.    [provider]  polyethylene glycol (MIRALAX  / GLYCOLAX ) 17 g packet Take 17 g by mouth 2 (two) times daily as needed for mild constipation. 12/15/23   Lonn Hicks, MD  prochlorperazine  (COMPAZINE ) 10 MG tablet Take 10 mg by mouth every 6 (six) hours as needed for nausea or vomiting.     [provider]     Family History  Problem Relation Age of Onset   Lung cancer Mother 59 - 71       smoked   Stroke Mother 47   Alzheimer's disease Father    CAD Father        95% occlusion of L Main & RCA, severe descending aorta, arterial & arteriolonephrosclerosis    Cholecystitis Father    Esophagitis Father    Cancer Brother 10 - 23       unknown type, agent orange exposure   Pancreatic cancer Daughter 71   BRCA 1/2 Neg Hx    Breast cancer Neg Hx    Colon cancer Neg Hx    Ovarian cancer Neg Hx    Endometrial cancer Neg Hx    Prostate cancer Neg Hx     Social History   Socioeconomic History   Marital status: Widowed    Spouse name: Not on file   Number of children: 2   Years of education: Not on file   Highest education level: Not on file  Occupational History   Not on file  Tobacco Use   Smoking status: Former    Current packs/day: 0.00    Types: Cigarettes    Quit date: 05/15/1988    Years since quitting: 36.4   Smokeless tobacco: Never  Vaping Use   Vaping status: Never Used  Substance and Sexual Activity   Alcohol use: Yes    Alcohol/week: 14.0 standard drinks of alcohol    Types: 14 drink(s) per week    Comment: white wine   Drug use: No   Sexual activity: Not Currently    Partners: Male  Other Topics Concern   Not on file  Social History Narrative   Not on file   Social Drivers of Health   Financial Resource Strain: Not on file  Food Insecurity: No Food Insecurity (02/21/2024)   Hunger Vital Sign    Worried About Running Out of Food in the Last Year: Never true    Ran Out of Food in the Last Year: Never true  Transportation Needs: Unknown (02/21/2024)   PRAPARE - Transportation    Lack of Transportation (Medical): No    Lack of Transportation (Non-Medical): Patient declined  Physical Activity: Not on file  Stress: Not on file  Social Connections: Socially Isolated (02/21/2024)   Social Connection and Isolation Panel    Frequency of  Communication with Friends and Family: Three times a week    Frequency of Social Gatherings with Friends and Family: Twice a week    Attends Religious Services: Never    Database Administrator or Organizations: No    Attends Banker Meetings: Never    Marital Status: Widowed       Review of Systems  Vital Signs:   Advance Care Plan: no documents on file  Physical Exam  Imaging: CT ABDOMEN PELVIS W CONTRAST Result Date: 10/04/2024 CLINICAL DATA:  High-grade serous carcinoma of ovary. Status post diagnostic  laparoscopy and peritoneal biopsies on 11/08/2023. Extensive surgery 02/20/2024. Status post chemotherapy. * Tracking Code: BO * EXAM: CT ABDOMEN AND PELVIS WITH CONTRAST TECHNIQUE: Multidetector CT imaging of the abdomen and pelvis was performed using the standard protocol following bolus administration of intravenous contrast. RADIATION DOSE REDUCTION: This exam was performed according to the departmental dose-optimization program which includes automated exposure control, adjustment of the mA and/or kV according to patient size and/or use of iterative reconstruction technique. CONTRAST:  OMNIPAQUE  IOHEXOL  300 MG/ML  SOLN COMPARISON:  06/03/2024 FINDINGS: Lower chest: Mild centrilobular emphysema. Mild cardiomegaly, without pericardial or pleural effusion. Hepatobiliary: Bilobar hepatic cysts including at up to 3.9 cm. Normal gallbladder, without biliary ductal dilatation. Pancreas: Upper normal pancreatic duct within the head, followed to the level of the ampulla, similar. No peripancreatic edema. Spleen: Normal in size, without focal abnormality. Adrenals/Urinary Tract: Normal adrenal glands. Normal kidneys, without hydronephrosis. Normal urinary bladder. Stomach/Bowel: Normal stomach, without wall thickening. Scattered colonic diverticula. Normal terminal ileum. Appendix not visualized. Normal small bowel. Vascular/Lymphatic: Advanced aortic and branch vessel  atherosclerosis. Circumaortic left renal vein. Portacaval node measures 1.3 cm in 22/2 versus 5 mm on the prior. Right common iliac artery nodes x2 at up to 1.0 cm on 43/2 are newly enlarged. Soft tissue density mass in the deep posterior left hemipelvis is new at 3.1 x 3.4 cm on 57/2. New nodes within the presacral space including at 8 mm on 51/2. Right perirectal node measures 6 mm on image 64/2 and is new. A cul-de-sac implant measures 2.2 x 1.9 cm on 62/2 and is new. Reproductive: Hysterectomy.  No adnexal mass. Other: No ascites or free intraperitoneal air. Musculoskeletal: Mild osteopenia. IMPRESSION: 1. Findings consistent with recurrent disease. New abdominal/pelvic nodal and likely peritoneal implants as detailed above, including a dominant 3.1 x 3.4 cm deep left pelvic mass. 2. No bowel obstruction or other acute complication. 3. Aortic atherosclerosis (ICD10-I70.0) and emphysema (ICD10-J43.9). Electronically Signed   By: Rockey Kilts M.D.   On: 10/04/2024 13:46    Labs:  CBC: Recent Labs    04/11/24 1254 05/02/24 1045 06/03/24 0928 10/02/24 1054  WBC 4.4 6.9 4.2 6.5  HGB 14.4 13.8 13.9 15.4*  HCT 42.9 41.9 41.1 46.6*  PLT 155 178 181 177    COAGS: Recent Labs    11/01/23 0527  INR 1.1    BMP: Recent Labs    04/11/24 1254 05/02/24 1045 06/03/24 0928 10/02/24 1054  NA 140 140 139 138  K 3.9 4.1 4.0 5.2*  CL 105 105 104 103  CO2 31 29 29  32  GLUCOSE 149* 148* 149* 123*  BUN 18 14 17 21   CALCIUM  9.4 9.4 9.3 10.3  CREATININE 0.55 0.55 0.62 0.74  GFRNONAA >60 >60 >60 >60    LIVER FUNCTION TESTS: Recent Labs    04/11/24 1254 05/02/24 1045 06/03/24 0928 10/02/24 1054  BILITOT 0.3 0.4 0.7 0.6  AST 18 22 21 26   ALT 13 15 14 14   ALKPHOS 83 80 75 88  PROT 6.7 6.7 6.6 7.1  ALBUMIN  4.1 4.1 4.0 4.3    TUMOR MARKERS: No results for input(s): AFPTM, CEA, CA199, CHROMGRNA in the last 8760 hours.  Assessment and Plan: 78 y.o. female with PMH sig for  arthritis, AAA, HLD, HTN, hypothyroidism, macular degeneration, cognitive decline and ovarian cancer initially diagnosed in 2014 ,status post neoadjuvant chemotherapy followed by optimal debulking surgery in March 2025 . Repeat CT imaging of the abdomen and pelvis now shows signs of  recurrent disease. She is scheduled today for port a cath placement to assist with treatment.Risks and benefits of image guided port-a-catheter placement was discussed with the patient including, but not limited to bleeding, infection, pneumothorax, or fibrin sheath development and need for additional procedures.  All of the patient's questions were answered, patient is agreeable to proceed. Consent signed and in chart.    Thank you for allowing our service to participate in Ximenna Fonseca 's care.  Electronically Signed: D. Franky Rakers, PA-C   10/08/2024, 2:27 PM      I spent a total of  20 minutes   in face to face in clinical consultation, greater than 50% of which was counseling/coordinating care for port a cath placement i

## 2024-10-09 ENCOUNTER — Other Ambulatory Visit: Payer: Self-pay

## 2024-10-09 ENCOUNTER — Inpatient Hospital Stay (HOSPITAL_COMMUNITY): Admission: RE | Admit: 2024-10-09 | Discharge: 2024-10-09 | Attending: Hematology and Oncology

## 2024-10-09 ENCOUNTER — Encounter (HOSPITAL_COMMUNITY): Payer: Self-pay

## 2024-10-09 ENCOUNTER — Ambulatory Visit (HOSPITAL_COMMUNITY)
Admission: RE | Admit: 2024-10-09 | Discharge: 2024-10-09 | Disposition: A | Discharge status: Home / Self Care | Source: Ambulatory Visit | Attending: Hematology and Oncology | Admitting: Hematology and Oncology

## 2024-10-09 DIAGNOSIS — Z87891 Personal history of nicotine dependence: Secondary | ICD-10-CM | POA: Insufficient documentation

## 2024-10-09 DIAGNOSIS — I10 Essential (primary) hypertension: Secondary | ICD-10-CM | POA: Insufficient documentation

## 2024-10-09 DIAGNOSIS — Z79899 Other long term (current) drug therapy: Secondary | ICD-10-CM | POA: Diagnosis not present

## 2024-10-09 DIAGNOSIS — I714 Abdominal aortic aneurysm, without rupture, unspecified: Secondary | ICD-10-CM | POA: Diagnosis not present

## 2024-10-09 DIAGNOSIS — C561 Malignant neoplasm of right ovary: Secondary | ICD-10-CM | POA: Diagnosis not present

## 2024-10-09 DIAGNOSIS — Z9221 Personal history of antineoplastic chemotherapy: Secondary | ICD-10-CM | POA: Insufficient documentation

## 2024-10-09 DIAGNOSIS — E039 Hypothyroidism, unspecified: Secondary | ICD-10-CM | POA: Insufficient documentation

## 2024-10-09 DIAGNOSIS — Z79890 Hormone replacement therapy (postmenopausal): Secondary | ICD-10-CM | POA: Diagnosis not present

## 2024-10-09 DIAGNOSIS — E785 Hyperlipidemia, unspecified: Secondary | ICD-10-CM | POA: Insufficient documentation

## 2024-10-09 DIAGNOSIS — Z1502 Genetic susceptibility to malignant neoplasm of ovary: Secondary | ICD-10-CM | POA: Diagnosis not present

## 2024-10-09 HISTORY — PX: IR IMAGING GUIDED PORT INSERTION: IMG5740

## 2024-10-09 MED ORDER — MIDAZOLAM HCL (PF) 2 MG/2ML IJ SOLN
INTRAMUSCULAR | Status: AC | PRN
  Administered 2024-10-09: 1 mg via INTRAVENOUS

## 2024-10-09 MED ORDER — FENTANYL CITRATE (PF) 100 MCG/2ML IJ SOLN
INTRAMUSCULAR | Status: AC | PRN
  Administered 2024-10-09: 50 ug via INTRAVENOUS

## 2024-10-09 MED ORDER — LIDOCAINE-EPINEPHRINE 1 %-1:100000 IJ SOLN
20.0000 mL | Freq: Once | INTRAMUSCULAR | Status: AC
  Administered 2024-10-09: 20 mL via INTRADERMAL

## 2024-10-09 MED ORDER — LIDOCAINE-EPINEPHRINE 1 %-1:100000 IJ SOLN
INTRAMUSCULAR | Status: AC
  Filled 2024-10-09: qty 1

## 2024-10-09 MED ORDER — MIDAZOLAM HCL 2 MG/2ML IJ SOLN
INTRAMUSCULAR | Status: AC
  Filled 2024-10-09: qty 2

## 2024-10-09 MED ORDER — FENTANYL CITRATE (PF) 100 MCG/2ML IJ SOLN
INTRAMUSCULAR | Status: AC
  Filled 2024-10-09: qty 2

## 2024-10-09 MED ORDER — HEPARIN SOD (PORK) LOCK FLUSH 100 UNIT/ML IV SOLN
INTRAVENOUS | Status: AC
  Filled 2024-10-09: qty 5

## 2024-10-09 MED ORDER — SODIUM CHLORIDE 0.9 % IV SOLN: INTRAVENOUS | Status: DC

## 2024-10-09 MED ORDER — HEPARIN SOD (PORK) LOCK FLUSH 100 UNIT/ML IV SOLN
500.0000 [IU] | Freq: Once | INTRAVENOUS | Status: AC
  Administered 2024-10-09: 500 [IU] via INTRAVENOUS

## 2024-10-09 MED ORDER — LIDOCAINE HCL 1 % IJ SOLN
20.0000 mL | Freq: Once | INTRAMUSCULAR | Status: AC
  Administered 2024-10-09: 10 mL via INTRADERMAL

## 2024-10-09 MED ORDER — LIDOCAINE HCL 1 % IJ SOLN
INTRAMUSCULAR | Status: AC
  Filled 2024-10-09: qty 20

## 2024-10-09 NOTE — Procedures (Signed)
Interventional Radiology Procedure:   Indications: Ovarian cancer  Procedure: Port placement  Findings: Right jugular port, tip at SVC/RA junction  Complications: None     EBL: Minimal, less than 10 ml  Plan: Discharge in one hour.  Keep port site and incisions dry for at least 24 hours.     Drena Ham R. Dewey Neukam, MD  Pager: [phone]   

## 2024-10-09 NOTE — Sedation Documentation (Signed)
 RN Sherell Christoffel pulled 2 mg Versed  and 100 mcg Fentanyl  in IR procedure room pysix. Pt. Received 2 mg Versed  and 100 mcg Fentanyl  throughout the procedure.

## 2024-10-09 NOTE — Discharge Instructions (Signed)

## 2024-10-11 ENCOUNTER — Ambulatory Visit
Admission: RE | Admit: 2024-10-11 | Discharge: 2024-10-11 | Disposition: A | Discharge status: Home / Self Care | Source: Ambulatory Visit | Attending: Hematology and Oncology | Admitting: Hematology and Oncology

## 2024-10-11 DIAGNOSIS — Z1231 Encounter for screening mammogram for malignant neoplasm of breast: Secondary | ICD-10-CM | POA: Diagnosis not present

## 2024-10-14 ENCOUNTER — Encounter (HOSPITAL_COMMUNITY): Payer: Self-pay

## 2024-10-14 ENCOUNTER — Ambulatory Visit (HOSPITAL_COMMUNITY)
Admission: RE | Admit: 2024-10-14 | Discharge: 2024-10-14 | Disposition: A | Discharge status: Home / Self Care | Source: Ambulatory Visit | Attending: Hematology and Oncology | Admitting: Hematology and Oncology

## 2024-10-14 DIAGNOSIS — J439 Emphysema, unspecified: Secondary | ICD-10-CM | POA: Diagnosis not present

## 2024-10-14 DIAGNOSIS — C561 Malignant neoplasm of right ovary: Secondary | ICD-10-CM | POA: Insufficient documentation

## 2024-10-14 DIAGNOSIS — I2510 Atherosclerotic heart disease of native coronary artery without angina pectoris: Secondary | ICD-10-CM | POA: Diagnosis not present

## 2024-10-14 DIAGNOSIS — R4182 Altered mental status, unspecified: Secondary | ICD-10-CM | POA: Diagnosis not present

## 2024-10-14 DIAGNOSIS — I700 Atherosclerosis of aorta: Secondary | ICD-10-CM | POA: Insufficient documentation

## 2024-10-14 DIAGNOSIS — C569 Malignant neoplasm of unspecified ovary: Secondary | ICD-10-CM

## 2024-10-14 MED ORDER — IOHEXOL 300 MG/ML  SOLN
75.0000 mL | Freq: Once | INTRAMUSCULAR | Status: AC | PRN
  Administered 2024-10-14: 75 mL via INTRAVENOUS

## 2024-10-14 MED ORDER — SODIUM CHLORIDE (PF) 0.9 % IJ SOLN
INTRAMUSCULAR | Status: AC
  Filled 2024-10-14: qty 50

## 2024-10-15 ENCOUNTER — Telehealth: Payer: Self-pay

## 2024-10-15 NOTE — Telephone Encounter (Signed)
 Returned call to son regarding CT of head and chest. He is concerned that only the CT of chest has resulted. Left a message that they have both resulted now and he can call the office if needed.

## 2024-10-16 ENCOUNTER — Ambulatory Visit (HOSPITAL_COMMUNITY)

## 2024-10-16 ENCOUNTER — Ambulatory Visit (HOSPITAL_COMMUNITY)
Admission: RE | Admit: 2024-10-16 | Discharge: 2024-10-16 | Disposition: A | Discharge status: Home / Self Care | Source: Ambulatory Visit | Attending: Hematology and Oncology | Admitting: Hematology and Oncology

## 2024-10-16 ENCOUNTER — Other Ambulatory Visit: Payer: Self-pay | Admitting: Medical Genetics

## 2024-10-16 DIAGNOSIS — H26491 Other secondary cataract, right eye: Secondary | ICD-10-CM | POA: Diagnosis not present

## 2024-10-16 DIAGNOSIS — C561 Malignant neoplasm of right ovary: Secondary | ICD-10-CM | POA: Diagnosis not present

## 2024-10-16 DIAGNOSIS — I714 Abdominal aortic aneurysm, without rupture, unspecified: Secondary | ICD-10-CM | POA: Diagnosis not present

## 2024-10-16 LAB — ECHOCARDIOGRAM COMPLETE
Area-P 1/2: 2.43 cm2
P 1/2 time: 437 ms
S' Lateral: 3 cm

## 2024-10-17 ENCOUNTER — Other Ambulatory Visit: Payer: Self-pay

## 2024-10-18 ENCOUNTER — Inpatient Hospital Stay

## 2024-10-18 ENCOUNTER — Inpatient Hospital Stay: Admitting: Hematology and Oncology

## 2024-10-18 ENCOUNTER — Encounter: Payer: Self-pay | Admitting: Hematology and Oncology

## 2024-10-18 ENCOUNTER — Inpatient Hospital Stay: Attending: Psychiatry

## 2024-10-18 VITALS — BP 106/64 | HR 86 | Temp 99.0°F | Resp 18 | Wt 111.5 lb

## 2024-10-18 DIAGNOSIS — C561 Malignant neoplasm of right ovary: Secondary | ICD-10-CM | POA: Diagnosis not present

## 2024-10-18 DIAGNOSIS — Z5111 Encounter for antineoplastic chemotherapy: Secondary | ICD-10-CM | POA: Diagnosis not present

## 2024-10-18 DIAGNOSIS — R978 Other abnormal tumor markers: Secondary | ICD-10-CM | POA: Diagnosis not present

## 2024-10-18 LAB — CMP (CANCER CENTER ONLY)
ALT: 12 U/L (ref 0–44)
AST: 25 U/L (ref 15–41)
Albumin: 4.1 g/dL (ref 3.5–5.0)
Alkaline Phosphatase: 90 U/L (ref 38–126)
Anion gap: 5 (ref 5–15)
BUN: 14 mg/dL (ref 8–23)
CO2: 30 mmol/L (ref 22–32)
Calcium: 9.5 mg/dL (ref 8.9–10.3)
Chloride: 102 mmol/L (ref 98–111)
Creatinine: 0.68 mg/dL (ref 0.44–1.00)
GFR, Estimated: >60 mL/min (ref >60)
Glucose, Bld: 153 mg/dL — ABNORMAL HIGH (ref 70–99)
Potassium: 4.3 mmol/L (ref 3.5–5.1)
Sodium: 137 mmol/L (ref 135–145)
Total Bilirubin: 0.7 mg/dL (ref 0.0–1.2)
Total Protein: 7 g/dL (ref 6.5–8.1)

## 2024-10-18 LAB — CBC WITH DIFFERENTIAL (CANCER CENTER ONLY)
Abs Immature Granulocytes: 0.03 K/uL (ref 0.00–0.07)
Basophils Absolute: 0.1 K/uL (ref 0.0–0.1)
Basophils Relative: 1 %
Eosinophils Absolute: 0.2 K/uL (ref 0.0–0.5)
Eosinophils Relative: 3 %
HCT: 45 % (ref 36.0–46.0)
Hemoglobin: 14.9 g/dL (ref 12.0–15.0)
Immature Granulocytes: 1 %
Lymphocytes Relative: 11 %
Lymphs Abs: 0.7 K/uL (ref 0.7–4.0)
MCH: 29.7 pg (ref 26.0–34.0)
MCHC: 33.1 g/dL (ref 30.0–36.0)
MCV: 89.8 fL (ref 80.0–100.0)
Monocytes Absolute: 0.8 K/uL (ref 0.1–1.0)
Monocytes Relative: 13 %
Neutro Abs: 4.5 K/uL (ref 1.7–7.7)
Neutrophils Relative %: 71 %
Platelet Count: 173 K/uL (ref 150–400)
RBC: 5.01 MIL/uL (ref 3.87–5.11)
RDW: 14.3 % (ref 11.5–15.5)
WBC Count: 6.3 K/uL (ref 4.0–10.5)
nRBC: 0 % (ref 0.0–0.2)

## 2024-10-18 MED ORDER — PALONOSETRON HCL INJECTION 0.25 MG/5ML
0.2500 mg | Freq: Once | INTRAVENOUS | Status: AC
  Administered 2024-10-18: 0.25 mg via INTRAVENOUS
  Filled 2024-10-18: qty 5

## 2024-10-18 MED ORDER — SODIUM CHLORIDE 0.9 % IV SOLN
306.0000 mg | Freq: Once | INTRAVENOUS | Status: AC
  Administered 2024-10-18: 310 mg via INTRAVENOUS
  Filled 2024-10-18: qty 31

## 2024-10-18 MED ORDER — DEXAMETHASONE SOD PHOSPHATE PF 10 MG/ML IJ SOLN
10.0000 mg | Freq: Once | INTRAMUSCULAR | Status: AC
  Administered 2024-10-18: 10 mg via INTRAVENOUS

## 2024-10-18 MED ORDER — SODIUM CHLORIDE 0.9 % IV SOLN: INTRAVENOUS | Status: DC

## 2024-10-18 MED ORDER — CETIRIZINE HCL 10 MG/ML IV SOLN
10.0000 mg | Freq: Once | INTRAVENOUS | Status: AC
  Administered 2024-10-18: 10 mg via INTRAVENOUS
  Filled 2024-10-18: qty 1

## 2024-10-18 MED ORDER — APREPITANT 130 MG/18ML IV EMUL
130.0000 mg | Freq: Once | INTRAVENOUS | Status: AC
  Administered 2024-10-18: 130 mg via INTRAVENOUS
  Filled 2024-10-18: qty 18

## 2024-10-18 MED ORDER — FAMOTIDINE IN NACL 20-0.9 MG/50ML-% IV SOLN
20.0000 mg | Freq: Once | INTRAVENOUS | Status: AC
  Administered 2024-10-18: 20 mg via INTRAVENOUS
  Filled 2024-10-18: qty 50

## 2024-10-18 MED ORDER — DEXTROSE 5 % IV SOLN: INTRAVENOUS | Status: DC

## 2024-10-18 MED ORDER — DOXORUBICIN HCL LIPOSOMAL CHEMO INJECTION 2 MG/ML
30.0000 mg/m2 | Freq: Once | INTRAVENOUS | Status: AC
  Administered 2024-10-18: 44 mg via INTRAVENOUS
  Filled 2024-10-18: qty 22

## 2024-10-18 NOTE — Patient Instructions (Signed)
 [facility] - A DEPT OF . [facility]  Discharge Instructions: Thank you for choosing [facility] to provide your oncology and hematology care.   If you have a lab appointment with the Cancer Center, please go directly to the Cancer Center and check in at the registration area.   Wear comfortable clothing and clothing appropriate for easy access to any Portacath or PICC line.   We strive to give you quality time with your provider. You may need to reschedule your appointment if you arrive late (15 or more minutes).  Arriving late affects you and other patients whose appointments are after yours.  Also, if you miss three or more appointments without notifying the office, you may be dismissed from the clinic at the provider's discretion.      For prescription refill requests, have your pharmacy contact our office and allow 72 hours for refills to be completed.    Today you received the following chemotherapy and/or immunotherapy agents: CARBOplatin  (PARAPLATIN ) and DOXOrubicin HCL LIPOSOMAL   To help prevent nausea and vomiting after your treatment, we encourage you to take your nausea medication as directed.  BELOW ARE SYMPTOMS THAT SHOULD BE REPORTED IMMEDIATELY: *FEVER GREATER THAN 100.4 F (38 C) OR HIGHER *CHILLS OR SWEATING *NAUSEA AND VOMITING THAT IS NOT CONTROLLED WITH YOUR NAUSEA MEDICATION *UNUSUAL SHORTNESS OF BREATH *UNUSUAL BRUISING OR BLEEDING *URINARY PROBLEMS (pain or burning when urinating, or frequent urination) *BOWEL PROBLEMS (unusual diarrhea, constipation, pain near the anus) TENDERNESS IN MOUTH AND THROAT WITH OR WITHOUT PRESENCE OF ULCERS (sore throat, sores in mouth, or a toothache) UNUSUAL RASH, SWELLING OR PAIN  UNUSUAL VAGINAL DISCHARGE OR ITCHING   Items with * indicate a potential emergency and should be followed up as soon as possible or go to the Emergency Department if any problems should occur.  Please show the  CHEMOTHERAPY ALERT CARD or IMMUNOTHERAPY ALERT CARD at check-in to the Emergency Department and triage nurse.  Should you have questions after your visit or need to cancel or reschedule your appointment, please contact [facility] - A DEPT OF [facility]  Dept: [phone]  and follow the prompts.  Office hours are 8:00 a.m. to 4:30 p.m. Monday - Friday. Please note that voicemails left after 4:00 p.m. may not be returned until the following business day.  We are closed weekends and major holidays. You have access to a nurse at all times for urgent questions. Please call the main number to the clinic Dept: [phone] and follow the prompts.   For any non-urgent questions, you may also contact your provider using MyChart. We now offer e-Visits for anyone 2 and older to request care online for non-urgent symptoms. For details visit [email or web address].   Also download the MyChart app! Go to the app store, search MyChart, open the app, select [place], and log in with your MyChart username and password.  Doxorubicin Injection What is this medication? DOXORUBICIN (dox oh ROO bi sin) treats some types of cancer. It works by slowing down the growth of cancer cells. This medicine may be used for other purposes; ask your health care provider or pharmacist if you have questions. COMMON BRAND NAME(S): Adriamycin, Adriamycin PFS, Adriamycin RDF, Rubex What should I tell my care team before I take this medication? They need to know if you have any of these conditions: Heart disease History of low blood cell levels caused by a medication  Liver disease Recent or ongoing radiation An unusual or allergic reaction to doxorubicin, other medications, foods, dyes, or preservatives If you or your partner are pregnant or trying to get pregnant Breast-feeding How should I use this medication? This medication is injected into a vein. It is given by your care team in a  hospital or clinic setting. Talk to your care team about the use of this medication in children. Special care may be needed. Overdosage: If you think you have taken too much of this medicine contact a poison control center or emergency room at once. NOTE: This medicine is only for you. Do not share this medicine with others. What if I miss a dose? Keep appointments for follow-up doses. It is important not to miss your dose. Call your care team if you are unable to keep an appointment. What may interact with this medication? 6-mercaptopurine Paclitaxel  Phenytoin St. John's wort Trastuzumab Verapamil This list may not describe all possible interactions. Give your health care provider a list of all the medicines, herbs, non-prescription drugs, or dietary supplements you use. Also tell them if you smoke, drink alcohol, or use illegal drugs. Some items may interact with your medicine. What should I watch for while using this medication? Your condition will be monitored carefully while you are receiving this medication. You may need blood work while taking this medication. This medication may make you feel generally unwell. This is not uncommon as chemotherapy can affect healthy cells as well as cancer cells. Report any side effects. Continue your course of treatment even though you feel ill unless your care team tells you to stop. There is a maximum amount of this medication you should receive throughout your life. The amount depends on the medical condition being treated and your overall health. Your care team will watch how much of this medication you receive. Tell your care team if you have taken this medication before. Your urine may turn red for a few days after your dose. This is not blood. If your urine is dark or brown, call your care team. In some cases, you may be given additional medications to help with side effects. Follow all directions for their use. This medication may increase your  risk of getting an infection. Call your care team for advice if you get a fever, chills, sore throat, or other symptoms of a cold or flu. Do not treat yourself. Try to avoid being around people who are sick. This medication may increase your risk to bruise or bleed. Call your care team if you notice any unusual bleeding. Talk to your care team about your risk of cancer. You may be more at risk for certain types of cancers if you take this medication. Talk to your care team if you or your partner may be pregnant. Serious birth defects can occur if you take this medication during pregnancy and for 6 months after the last dose. Contraception is recommended while taking this medication and for 6 months after the last dose. Your care team can help you find the option that works for you. If your partner can get pregnant, use a condom while taking this medication and for 6 months after the last dose. Do not breastfeed while taking this medication. This medication may cause infertility. Talk to your care team if you are concerned about your fertility. What side effects may I notice from receiving this medication? Side effects that you should report to your care team as soon as possible: Allergic reactions--skin  rash, itching, hives, swelling of the face, lips, tongue, or throat Heart failure--shortness of breath, swelling of the ankles, feet, or hands, sudden weight gain, unusual weakness or fatigue Heart rhythm changes--fast or irregular heartbeat, dizziness, feeling faint or lightheaded, chest pain, trouble breathing Infection--fever, chills, cough, sore throat, wounds that don't heal, pain or trouble when passing urine, general feeling of discomfort or being unwell Low red blood cell level--unusual weakness or fatigue, dizziness, headache, trouble breathing Painful swelling, warmth, or redness of the skin, blisters or sores at the infusion site Unusual bruising or bleeding Side effects that usually do not  require medical attention (report to your care team if they continue or are bothersome): Diarrhea Hair loss Nausea Pain, redness, or swelling with sores inside the mouth or throat Red urine This list may not describe all possible side effects. Call your doctor for medical advice about side effects. You may report side effects to FDA at 1-800-FDA-1088. Where should I keep my medication? This medication is given in a hospital or clinic. It will not be stored at home. NOTE: This sheet is a summary. It may not cover all possible information. If you have questions about this medicine, talk to your doctor, pharmacist, or health care provider.  2024 Elsevier/Gold Standard (2023-03-02 00:00:00)Carboplatin  Injection What is this medication? CARBOPLATIN  (KAR boe pla tin) treats some types of cancer. It works by slowing down the growth of cancer cells. This medicine may be used for other purposes; ask your health care provider or pharmacist if you have questions. COMMON BRAND NAME(S): Paraplatin  What should I tell my care team before I take this medication? They need to know if you have any of these conditions: Blood disorders Hearing problems Kidney disease Recent or ongoing radiation therapy An unusual or allergic reaction to carboplatin , cisplatin, other medications, foods, dyes, or preservatives Pregnant or trying to get pregnant Breast-feeding How should I use this medication? This medication is injected into a vein. It is given by your care team in a hospital or clinic setting. Talk to your care team about the use of this medication in children. Special care may be needed. Overdosage: If you think you have taken too much of this medicine contact a poison control center or emergency room at once. NOTE: This medicine is only for you. Do not share this medicine with others. What if I miss a dose? Keep appointments for follow-up doses. It is important not to miss your dose. Call your care team  if you are unable to keep an appointment. What may interact with this medication? Medications for seizures Some antibiotics, such as amikacin, gentamicin, neomycin, streptomycin, tobramycin  Vaccines This list may not describe all possible interactions. Give your health care provider a list of all the medicines, herbs, non-prescription drugs, or dietary supplements you use. Also tell them if you smoke, drink alcohol, or use illegal drugs. Some items may interact with your medicine. What should I watch for while using this medication? Your condition will be monitored carefully while you are receiving this medication. You may need blood work while taking this medication. This medication may make you feel generally unwell. This is not uncommon, as chemotherapy can affect healthy cells as well as cancer cells. Report any side effects. Continue your course of treatment even though you feel ill unless your care team tells you to stop. In some cases, you may be given additional medications to help with side effects. Follow all directions for their use. This medication may increase your risk  of getting an infection. Call your care team for advice if you get a fever, chills, sore throat, or other symptoms of a cold or flu. Do not treat yourself. Try to avoid being around people who are sick. Avoid taking medications that contain aspirin, acetaminophen , ibuprofen, naproxen, or ketoprofen unless instructed by your care team. These medications may hide a fever. Be careful brushing or flossing your teeth or using a toothpick because you may get an infection or bleed more easily. If you have any dental work done, tell your dentist you are receiving this medication. Talk to your care team if you wish to become pregnant or think you might be pregnant. This medication can cause serious birth defects. Talk to your care team about effective forms of contraception. Do not breast-feed while taking this medication. What  side effects may I notice from receiving this medication? Side effects that you should report to your care team as soon as possible: Allergic reactions--skin rash, itching, hives, swelling of the face, lips, tongue, or throat Infection--fever, chills, cough, sore throat, wounds that don't heal, pain or trouble when passing urine, general feeling of discomfort or being unwell Low red blood cell level--unusual weakness or fatigue, dizziness, headache, trouble breathing Pain, tingling, or numbness in the hands or feet, muscle weakness, change in vision, confusion or trouble speaking, loss of balance or coordination, trouble walking, seizures Unusual bruising or bleeding Side effects that usually do not require medical attention (report to your care team if they continue or are bothersome): Hair loss Nausea Unusual weakness or fatigue Vomiting This list may not describe all possible side effects. Call your doctor for medical advice about side effects. You may report side effects to FDA at 1-800-FDA-1088. Where should I keep my medication? This medication is given in a hospital or clinic. It will not be stored at home. NOTE: This sheet is a summary. It may not cover all possible information. If you have questions about this medicine, talk to your doctor, pharmacist, or health care provider.  2024 Elsevier/Gold Standard (2022-03-22 00:00:00)

## 2024-10-18 NOTE — Progress Notes (Signed)
 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  She completed adjuvant chemotherapy with carboplatin  and paclitaxel  in May 2025 Unfortunately, recent tumor marker was elevated I reviewed repeat CT imaging of the abdomen and pelvis with the patient and family which unfortunately shows signs of recurrent disease  I reviewed test results with son CT imaging of the chest and CT imaging of the head show no evidence of metastatic disease Echocardiogram from November 2025 showed preserved ejection fraction I reviewed common side effects to be expected and will proceed with treatment today We will omit bevacizumab  for 6 weeks after port placement I plan to repeat imaging study for objective assessment response to therapy after 3 cycles  No orders of the defined types were placed in this encounter.    Hicks Lonn, MD  INTERVAL HISTORY: she returns for treatment follow-up Review results of echocardiogram, CT imaging of the chest and CT imaging of the head She tolerated port placement well  PHYSICAL EXAMINATION: ECOG PERFORMANCE STATUS: 0 - Asymptomatic  Lab Results  Component Value Date   CAN125 44.6 (H) 10/02/2024   CAN125 40.5 (H) 09/23/2024   CAN125 10.1 06/03/2024      Latest Ref Rng & Units 10/18/2024    7:59 AM 10/02/2024   10:54 AM 06/03/2024    9:28 AM  CBC  WBC 4.0 - 10.5 K/uL 6.3  6.5  4.2   Hemoglobin 12.0 - 15.0 g/dL 85.0  84.5  86.0   Hematocrit 36.0 - 46.0 % 45.0  46.6  41.1   Platelets 150 - 400 K/uL 173  177  181        Chemistry      Component Value Date/Time   NA 137 10/18/2024 0759   K 4.3 10/18/2024 0759   CL 102 10/18/2024 0759   CO2 30 10/18/2024 0759   BUN 14 10/18/2024 0759   CREATININE 0.68 10/18/2024 0759      Component Value Date/Time   CALCIUM  9.5 10/18/2024 0759   ALKPHOS 90 10/18/2024 0759   AST 25 10/18/2024 0759   ALT 12 10/18/2024 0759   BILITOT 0.7 10/18/2024 0759       There were no vitals filed for this visit. There were no vitals filed for this visit. Other relevant data reviewed during this visit included CT imaging, echocardiogram, labs

## 2024-10-18 NOTE — Assessment & Plan Note (Addendum)
 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  She completed adjuvant chemotherapy with carboplatin  and paclitaxel  in May 2025 Unfortunately, recent tumor marker was elevated I reviewed repeat CT imaging of the abdomen and pelvis with the patient and family which unfortunately shows signs of recurrent disease  I reviewed test results with son CT imaging of the chest and CT imaging of the head show no evidence of metastatic disease Echocardiogram from November 2025 showed preserved ejection fraction I reviewed common side effects to be expected and will proceed with treatment today We will omit bevacizumab  for 6 weeks after port placement I plan to repeat imaging study for objective assessment response to therapy after 3 cycles

## 2024-10-21 ENCOUNTER — Telehealth: Payer: Self-pay

## 2024-10-21 DIAGNOSIS — K5900 Constipation, unspecified: Secondary | ICD-10-CM | POA: Diagnosis not present

## 2024-10-21 DIAGNOSIS — I10 Essential (primary) hypertension: Secondary | ICD-10-CM | POA: Diagnosis not present

## 2024-10-21 DIAGNOSIS — C569 Malignant neoplasm of unspecified ovary: Secondary | ICD-10-CM | POA: Diagnosis not present

## 2024-10-21 NOTE — Telephone Encounter (Signed)
-----   Message from Nurse Milo KIDD sent at 10/18/2024  4:08 PM EST ----- Regarding: First time Docetaxel and Carboplatin  Dr. Buzz patient. First time Docetaxel and Carboplatin , she tolerated it well without any problem. Thank you.

## 2024-10-23 DIAGNOSIS — E782 Mixed hyperlipidemia: Secondary | ICD-10-CM | POA: Diagnosis not present

## 2024-10-23 DIAGNOSIS — Z87440 Personal history of urinary (tract) infections: Secondary | ICD-10-CM | POA: Diagnosis not present

## 2024-10-23 DIAGNOSIS — C569 Malignant neoplasm of unspecified ovary: Secondary | ICD-10-CM | POA: Diagnosis not present

## 2024-10-23 DIAGNOSIS — E038 Other specified hypothyroidism: Secondary | ICD-10-CM | POA: Diagnosis not present

## 2024-10-24 ENCOUNTER — Telehealth: Payer: Self-pay

## 2024-10-24 NOTE — Telephone Encounter (Signed)
 Returned her call. She is complaining of just being exhausted after treatment on 11/7. She is eating with no problems. She feels that she is getting enough sleep and is able to walk outside with her dog daily. She is drinking 5 bottles of water  a day. She is concerned that she will set down and some times just fall asleep.  She is asking if Dr. Lonn has any recommendations.

## 2024-10-24 NOTE — Telephone Encounter (Signed)
 Fatigue is to be expected side effects from treatment We can discuss dose adjustment next visit

## 2024-10-24 NOTE — Telephone Encounter (Signed)
 Called and given below message. She verbalized understanding.

## 2024-10-31 ENCOUNTER — Other Ambulatory Visit

## 2024-10-31 DIAGNOSIS — Z006 Encounter for examination for normal comparison and control in clinical research program: Secondary | ICD-10-CM

## 2024-11-11 DIAGNOSIS — I10 Essential (primary) hypertension: Secondary | ICD-10-CM | POA: Diagnosis not present

## 2024-11-11 DIAGNOSIS — J069 Acute upper respiratory infection, unspecified: Secondary | ICD-10-CM | POA: Diagnosis not present

## 2024-11-11 DIAGNOSIS — R051 Acute cough: Secondary | ICD-10-CM | POA: Diagnosis not present

## 2024-11-11 DIAGNOSIS — E038 Other specified hypothyroidism: Secondary | ICD-10-CM | POA: Diagnosis not present

## 2024-11-11 LAB — GENECONNECT MOLECULAR SCREEN: Genetic Analysis Overall Interpretation: NEGATIVE

## 2024-11-14 ENCOUNTER — Other Ambulatory Visit: Payer: Self-pay

## 2024-11-15 ENCOUNTER — Inpatient Hospital Stay: Attending: Psychiatry

## 2024-11-15 ENCOUNTER — Encounter: Payer: Self-pay | Admitting: Hematology and Oncology

## 2024-11-15 ENCOUNTER — Inpatient Hospital Stay

## 2024-11-15 ENCOUNTER — Inpatient Hospital Stay: Admitting: Hematology and Oncology

## 2024-11-15 VITALS — BP 106/63 | HR 84 | Temp 98.4°F | Resp 16 | Wt 106.2 lb

## 2024-11-15 DIAGNOSIS — C561 Malignant neoplasm of right ovary: Secondary | ICD-10-CM | POA: Insufficient documentation

## 2024-11-15 DIAGNOSIS — Z5111 Encounter for antineoplastic chemotherapy: Secondary | ICD-10-CM | POA: Diagnosis present

## 2024-11-15 DIAGNOSIS — D696 Thrombocytopenia, unspecified: Secondary | ICD-10-CM | POA: Diagnosis not present

## 2024-11-15 LAB — CMP (CANCER CENTER ONLY)
ALT: 23 U/L (ref 0–44)
AST: 41 U/L (ref 15–41)
Albumin: 4.3 g/dL (ref 3.5–5.0)
Alkaline Phosphatase: 97 U/L (ref 38–126)
Anion gap: 10 (ref 5–15)
BUN: 14 mg/dL (ref 8–23)
CO2: 25 mmol/L (ref 22–32)
Calcium: 9.6 mg/dL (ref 8.9–10.3)
Chloride: 101 mmol/L (ref 98–111)
Creatinine: 0.63 mg/dL (ref 0.44–1.00)
GFR, Estimated: >60 mL/min (ref >60)
Glucose, Bld: 199 mg/dL — ABNORMAL HIGH (ref 70–99)
Potassium: 5 mmol/L (ref 3.5–5.1)
Sodium: 136 mmol/L (ref 135–145)
Total Bilirubin: 0.8 mg/dL (ref 0.0–1.2)
Total Protein: 6.7 g/dL (ref 6.5–8.1)

## 2024-11-15 LAB — CBC WITH DIFFERENTIAL (CANCER CENTER ONLY)
Abs Immature Granulocytes: 0.01 K/uL (ref 0.00–0.07)
Basophils Absolute: 0 K/uL (ref 0.0–0.1)
Basophils Relative: 1 %
Eosinophils Absolute: 0.1 K/uL (ref 0.0–0.5)
Eosinophils Relative: 1 %
HCT: 44.7 % (ref 36.0–46.0)
Hemoglobin: 15.2 g/dL — ABNORMAL HIGH (ref 12.0–15.0)
Immature Granulocytes: 0 %
Lymphocytes Relative: 22 %
Lymphs Abs: 1 K/uL (ref 0.7–4.0)
MCH: 30.5 pg (ref 26.0–34.0)
MCHC: 34 g/dL (ref 30.0–36.0)
MCV: 89.6 fL (ref 80.0–100.0)
Monocytes Absolute: 0.6 K/uL (ref 0.1–1.0)
Monocytes Relative: 13 %
Neutro Abs: 2.8 K/uL (ref 1.7–7.7)
Neutrophils Relative %: 63 %
Platelet Count: 140 K/uL — ABNORMAL LOW (ref 150–400)
RBC: 4.99 MIL/uL (ref 3.87–5.11)
RDW: 16 % — ABNORMAL HIGH (ref 11.5–15.5)
WBC Count: 4.5 K/uL (ref 4.0–10.5)
nRBC: 0 % (ref 0.0–0.2)

## 2024-11-15 LAB — TOTAL PROTEIN, URINE DIPSTICK: Protein, ur: NEGATIVE mg/dL

## 2024-11-15 MED ORDER — PALONOSETRON HCL INJECTION 0.25 MG/5ML
0.2500 mg | Freq: Once | INTRAVENOUS | Status: AC
  Administered 2024-11-15: 0.25 mg via INTRAVENOUS
  Filled 2024-11-15: qty 5

## 2024-11-15 MED ORDER — FAMOTIDINE IN NACL 20-0.9 MG/50ML-% IV SOLN
20.0000 mg | Freq: Once | INTRAVENOUS | Status: AC
  Administered 2024-11-15: 20 mg via INTRAVENOUS
  Filled 2024-11-15: qty 50

## 2024-11-15 MED ORDER — DEXTROSE 5 % IV SOLN: INTRAVENOUS | Status: DC

## 2024-11-15 MED ORDER — APREPITANT 130 MG/18ML IV EMUL
130.0000 mg | Freq: Once | INTRAVENOUS | Status: AC
  Administered 2024-11-15: 130 mg via INTRAVENOUS
  Filled 2024-11-15: qty 18

## 2024-11-15 MED ORDER — SODIUM CHLORIDE 0.9 % IV SOLN
306.0000 mg | Freq: Once | INTRAVENOUS | Status: AC
  Administered 2024-11-15: 310 mg via INTRAVENOUS
  Filled 2024-11-15: qty 31

## 2024-11-15 MED ORDER — CETIRIZINE HCL 10 MG/ML IV SOLN
10.0000 mg | Freq: Once | INTRAVENOUS | Status: AC
  Administered 2024-11-15: 10 mg via INTRAVENOUS
  Filled 2024-11-15: qty 1

## 2024-11-15 MED ORDER — SODIUM CHLORIDE 0.9 % IV SOLN: INTRAVENOUS | Status: DC

## 2024-11-15 MED ORDER — SODIUM CHLORIDE 0.9% FLUSH: 10.0000 mL | INTRAVENOUS | Status: DC | PRN

## 2024-11-15 MED ORDER — DEXAMETHASONE SOD PHOSPHATE PF 10 MG/ML IJ SOLN
10.0000 mg | Freq: Once | INTRAMUSCULAR | Status: AC
  Administered 2024-11-15: 10 mg via INTRAVENOUS

## 2024-11-15 MED ORDER — DOXORUBICIN HCL LIPOSOMAL CHEMO INJECTION 2 MG/ML
30.0000 mg/m2 | Freq: Once | INTRAVENOUS | Status: AC
  Administered 2024-11-15: 44 mg via INTRAVENOUS
  Filled 2024-11-15: qty 22

## 2024-11-15 NOTE — Progress Notes (Signed)
 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 The patient denies recent history of bleeding such as epistaxis, hematuria or hematochezia. She is asymptomatic from the low platelet count. I will observe for now.  she does not require transfusion now. I will continue the chemotherapy at current dose without dosage adjustment.  If the thrombocytopenia gets progressive worse in the future, I might have to delay her treatment or adjust the chemotherapy dose.   No orders of the defined types were placed in this encounter.    Hicks Lonn, MD  INTERVAL HISTORY: she returns for treatment follow-up Complications related to previous cycle of chemotherapy included thrombocytopenia,  PHYSICAL EXAMINATION: ECOG PERFORMANCE STATUS: 0 - Asymptomatic  Lab Results  Component Value Date   CAN125 44.6 (H) 10/02/2024   CAN125 40.5 (H) 09/23/2024   CAN125 10.1 06/03/2024      Latest Ref Rng & Units 11/15/2024    7:55 AM 10/18/2024    7:59 AM 10/02/2024   10:54 AM   CBC  WBC 4.0 - 10.5 K/uL 4.5  6.3  6.5   Hemoglobin 12.0 - 15.0 g/dL 84.7  85.0  84.5   Hematocrit 36.0 - 46.0 % 44.7  45.0  46.6   Platelets 150 - 400 K/uL 140  173  177       Chemistry      Component Value Date/Time   NA 136 11/15/2024 0755   K 5.0 11/15/2024 0755   CL 101 11/15/2024 0755   CO2 25 11/15/2024 0755   BUN 14 11/15/2024 0755   CREATININE 0.63 11/15/2024 0755      Component Value Date/Time   CALCIUM  9.6 11/15/2024 0755   ALKPHOS 97 11/15/2024 0755   AST 41 11/15/2024 0755   ALT 23 11/15/2024 0755   BILITOT 0.8 11/15/2024 0755       There were no vitals filed for this visit. There were no vitals filed for this visit. Other relevant data reviewed during this visit included CBC, CMP

## 2024-11-15 NOTE — Assessment & Plan Note (Addendum)
 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

## 2024-11-15 NOTE — Assessment & Plan Note (Addendum)
This is likely due to recent treatment. The patient denies recent history of bleeding such as epistaxis, hematuria or hematochezia. She is asymptomatic from the low platelet count. I will observe for now.  she does not require transfusion now. I will continue the chemotherapy at current dose without dosage adjustment.  If the thrombocytopenia gets progressive worse in the future, I might have to delay her treatment or adjust the chemotherapy dose.   

## 2024-11-16 LAB — CA 125: Cancer Antigen (CA) 125: 92 U/mL — ABNORMAL HIGH (ref 0.0–38.1)

## 2024-11-20 ENCOUNTER — Encounter: Payer: Self-pay | Admitting: Hematology and Oncology

## 2024-11-22 ENCOUNTER — Telehealth: Payer: Self-pay

## 2024-11-22 ENCOUNTER — Other Ambulatory Visit: Payer: Self-pay

## 2024-11-22 DIAGNOSIS — R4189 Other symptoms and signs involving cognitive functions and awareness: Secondary | ICD-10-CM

## 2024-11-22 DIAGNOSIS — C561 Malignant neoplasm of right ovary: Secondary | ICD-10-CM

## 2024-11-22 NOTE — Telephone Encounter (Signed)
 Called son to follow on mychart message for complaint of dizziness/ brain fog. JJ will encourage more oral fluids and thinks that she is not drinking enough. He would like the referral to Dr. Buckley to evaluate and will keep appt with neurologist on 1/12. Above message given to Dr. Lonn.

## 2024-11-29 ENCOUNTER — Inpatient Hospital Stay

## 2024-11-29 ENCOUNTER — Encounter: Payer: Self-pay | Admitting: Hematology and Oncology

## 2024-11-29 ENCOUNTER — Inpatient Hospital Stay: Admitting: Hematology and Oncology

## 2024-11-29 VITALS — BP 126/78 | HR 86 | Temp 97.9°F | Resp 16 | Wt 110.0 lb

## 2024-11-29 DIAGNOSIS — R4189 Other symptoms and signs involving cognitive functions and awareness: Secondary | ICD-10-CM

## 2024-11-29 DIAGNOSIS — C561 Malignant neoplasm of right ovary: Secondary | ICD-10-CM | POA: Diagnosis not present

## 2024-11-29 DIAGNOSIS — D696 Thrombocytopenia, unspecified: Secondary | ICD-10-CM

## 2024-11-29 DIAGNOSIS — Z5111 Encounter for antineoplastic chemotherapy: Secondary | ICD-10-CM | POA: Diagnosis not present

## 2024-11-29 MED ORDER — SODIUM CHLORIDE 0.9 % IV SOLN: INTRAVENOUS | Status: DC

## 2024-11-29 MED ORDER — SODIUM CHLORIDE 0.9 % IV SOLN
10.0000 mg/kg | Freq: Once | INTRAVENOUS | Status: AC
  Administered 2024-11-29: 500 mg via INTRAVENOUS
  Filled 2024-11-29: qty 4

## 2024-11-29 NOTE — Patient Instructions (Signed)
 [facility] - A DEPT OF [facility]  Discharge Instructions: Thank you for choosing [facility] to provide your oncology and hematology care.   If you have a lab appointment with the Cancer Center, please go directly to the Cancer Center and check in at the registration area.   Wear comfortable clothing and clothing appropriate for easy access to any Portacath or PICC line.   We strive to give you quality time with your provider. You may need to reschedule your appointment if you arrive late (15 or more minutes).  Arriving late affects you and other patients whose appointments are after yours.  Also, if you miss three or more appointments without notifying the office, you may be dismissed from the clinic at the provider's discretion.      For prescription refill requests, have your pharmacy contact our office and allow 72 hours for refills to be completed.    Today you received the following chemotherapy and/or immunotherapy agents: MVASI     To help prevent nausea and vomiting after your treatment, we encourage you to take your nausea medication as directed.  BELOW ARE SYMPTOMS THAT SHOULD BE REPORTED IMMEDIATELY: *FEVER GREATER THAN 100.4 F (38 C) OR HIGHER *CHILLS OR SWEATING *NAUSEA AND VOMITING THAT IS NOT CONTROLLED WITH YOUR NAUSEA MEDICATION *UNUSUAL SHORTNESS OF BREATH *UNUSUAL BRUISING OR BLEEDING *URINARY PROBLEMS (pain or burning when urinating, or frequent urination) *BOWEL PROBLEMS (unusual diarrhea, constipation, pain near the anus) TENDERNESS IN MOUTH AND THROAT WITH OR WITHOUT PRESENCE OF ULCERS (sore throat, sores in mouth, or a toothache) UNUSUAL RASH, SWELLING OR PAIN  UNUSUAL VAGINAL DISCHARGE OR ITCHING   Items with * indicate a potential emergency and should be followed up as soon as possible or go to the Emergency Department if any problems should occur.  Please show the CHEMOTHERAPY ALERT CARD or IMMUNOTHERAPY ALERT  CARD at check-in to the Emergency Department and triage nurse.  Should you have questions after your visit or need to cancel or reschedule your appointment, please contact [facility] - A DEPT OF [facility]  Dept: [phone]  and follow the prompts.  Office hours are 8:00 a.m. to 4:30 p.m. Monday - Friday. Please note that voicemails left after 4:00 p.m. may not be returned until the following business day.  We are closed weekends and major holidays. You have access to a nurse at all times for urgent questions. Please call the main number to the clinic Dept: [phone] and follow the prompts.   For any non-urgent questions, you may also contact your provider using MyChart. We now offer e-Visits for anyone 109 and older to request care online for non-urgent symptoms. For details visit [email or web address].   Also download the MyChart app! Go to the app store, search "MyChart", open the app, select [place], and log in with your MyChart username and password.

## 2024-11-29 NOTE — Assessment & Plan Note (Addendum)
This is likely due to recent treatment. The patient denies recent history of bleeding such as epistaxis, hematuria or hematochezia. She is asymptomatic from the low platelet count. I will observe for now.  she does not require transfusion now. I will continue the chemotherapy at current dose without dosage adjustment.  If the thrombocytopenia gets progressive worse in the future, I might have to delay her treatment or adjust the chemotherapy dose.   

## 2024-11-29 NOTE — Assessment & Plan Note (Addendum)
 She was noted to have progressive cognitive decline She was recently dehydrated due to being forgetful and not drinking enough fluids Her son instructed the nursing staff in her assisted living to make sure she drink at least 64 ounces of fluid per day As above, appointment to see neuro oncologist is pending for evaluation of possible dementia

## 2024-11-29 NOTE — Progress Notes (Signed)
 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 The patient denies recent history of bleeding such as epistaxis, hematuria or hematochezia. She is asymptomatic from the low platelet count. I will observe for now.  she does not require transfusion now. I will continue the chemotherapy at current dose without dosage adjustment.  If the thrombocytopenia gets progressive worse in the future, I might have to delay her treatment or adjust the chemotherapy dose.  Cognitive decline She was noted to have progressive cognitive decline She was recently dehydrated due to being forgetful and not drinking enough fluids Her son instructed the nursing staff in her assisted living to make sure she drink at least 64 ounces of fluid per day As above, appointment to see neuro oncologist is pending for evaluation of possible dementia  No orders of the defined types were placed in this encounter.    Hicks Lonn, MD  INTERVAL  HISTORY: she returns for treatment follow-up Complications related to previous cycle of chemotherapy included thrombocytopenia, and dehydration, She is here accompanied by her son who provided most of the history She recently got dehydrated and was feeling bad She rebounded quickly with adequate hydration She denies nausea, mouth sores or uncontrolled hypertension while on current treatment with liposomal doxorubicin , carboplatin  and bevacizumab   PHYSICAL EXAMINATION: ECOG PERFORMANCE STATUS: 1 - Symptomatic but completely ambulatory  Lab Results  Component Value Date   CAN125 92.0 (H) 11/15/2024   CAN125 44.6 (H) 10/02/2024   CAN125 40.5 (H) 09/23/2024      Latest Ref Rng & Units 11/15/2024    7:55 AM 10/18/2024    7:59 AM 10/02/2024   10:54 AM  CBC  WBC 4.0 - 10.5 K/uL 4.5  6.3  6.5   Hemoglobin 12.0 - 15.0 g/dL 84.7  85.0  84.5   Hematocrit 36.0 - 46.0 % 44.7  45.0  46.6   Platelets 150 - 400 K/uL 140  173  177       Chemistry      Component Value Date/Time   NA 136 11/15/2024 0755   K 5.0 11/15/2024 0755   CL 101 11/15/2024 0755   CO2 25 11/15/2024 0755   BUN 14 11/15/2024 0755   CREATININE 0.63 11/15/2024 0755      Component Value Date/Time   CALCIUM  9.6 11/15/2024 0755   ALKPHOS 97 11/15/2024 0755   AST 41 11/15/2024 0755   ALT 23 11/15/2024 0755   BILITOT 0.8 11/15/2024 0755       There were no vitals filed for this visit. There were no vitals filed for this visit. Other relevant data reviewed during this visit  included CBC and CMP, CA125

## 2024-11-29 NOTE — Assessment & Plan Note (Addendum)
 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

## 2024-12-02 ENCOUNTER — Telehealth: Payer: Self-pay

## 2024-12-02 NOTE — Telephone Encounter (Signed)
-----   Message from Nurse Damien ORN, RN sent at 11/29/2024 11:19 AM EST ----- Regarding: First time Dr Lonn First time MVASI . Tolerated well. Please f/u. THanks!

## 2024-12-02 NOTE — Telephone Encounter (Signed)
LM for patient that this nurse was calling to see how they were doing after their treatment. Please call back to Dr. Gorsuch's nurse at [phone] if they have any questions or concerns regarding the treatment. 

## 2024-12-13 ENCOUNTER — Telehealth: Payer: Self-pay

## 2024-12-13 ENCOUNTER — Inpatient Hospital Stay: Attending: Psychiatry

## 2024-12-13 ENCOUNTER — Inpatient Hospital Stay

## 2024-12-13 ENCOUNTER — Inpatient Hospital Stay: Admitting: Hematology and Oncology

## 2024-12-13 ENCOUNTER — Other Ambulatory Visit: Payer: Self-pay

## 2024-12-13 VITALS — BP 123/65 | HR 82 | Temp 98.0°F | Resp 16

## 2024-12-13 DIAGNOSIS — R4189 Other symptoms and signs involving cognitive functions and awareness: Secondary | ICD-10-CM

## 2024-12-13 DIAGNOSIS — Z800 Family history of malignant neoplasm of digestive organs: Secondary | ICD-10-CM | POA: Diagnosis not present

## 2024-12-13 DIAGNOSIS — Z801 Family history of malignant neoplasm of trachea, bronchus and lung: Secondary | ICD-10-CM | POA: Diagnosis not present

## 2024-12-13 DIAGNOSIS — Z87891 Personal history of nicotine dependence: Secondary | ICD-10-CM | POA: Insufficient documentation

## 2024-12-13 DIAGNOSIS — G3184 Mild cognitive impairment, so stated: Secondary | ICD-10-CM | POA: Insufficient documentation

## 2024-12-13 DIAGNOSIS — C561 Malignant neoplasm of right ovary: Secondary | ICD-10-CM

## 2024-12-13 DIAGNOSIS — Z5111 Encounter for antineoplastic chemotherapy: Secondary | ICD-10-CM | POA: Diagnosis present

## 2024-12-13 DIAGNOSIS — R32 Unspecified urinary incontinence: Secondary | ICD-10-CM

## 2024-12-13 DIAGNOSIS — R42 Dizziness and giddiness: Secondary | ICD-10-CM | POA: Insufficient documentation

## 2024-12-13 DIAGNOSIS — R159 Full incontinence of feces: Secondary | ICD-10-CM | POA: Diagnosis not present

## 2024-12-13 DIAGNOSIS — Z1502 Genetic susceptibility to malignant neoplasm of ovary: Secondary | ICD-10-CM | POA: Insufficient documentation

## 2024-12-13 DIAGNOSIS — R978 Other abnormal tumor markers: Secondary | ICD-10-CM | POA: Diagnosis not present

## 2024-12-13 LAB — CMP (CANCER CENTER ONLY)
ALT: 19 U/L (ref 0–44)
AST: 31 U/L (ref 15–41)
Albumin: 4.4 g/dL (ref 3.5–5.0)
Alkaline Phosphatase: 82 U/L (ref 38–126)
Anion gap: 10 (ref 5–15)
BUN: 16 mg/dL (ref 8–23)
CO2: 27 mmol/L (ref 22–32)
Calcium: 9.4 mg/dL (ref 8.9–10.3)
Chloride: 102 mmol/L (ref 98–111)
Creatinine: 0.83 mg/dL (ref 0.44–1.00)
GFR, Estimated: >60 mL/min
Glucose, Bld: 108 mg/dL — ABNORMAL HIGH (ref 70–99)
Potassium: 4.3 mmol/L (ref 3.5–5.1)
Sodium: 139 mmol/L (ref 135–145)
Total Bilirubin: 0.5 mg/dL (ref 0.0–1.2)
Total Protein: 6.7 g/dL (ref 6.5–8.1)

## 2024-12-13 LAB — CBC WITH DIFFERENTIAL (CANCER CENTER ONLY)
Abs Immature Granulocytes: 0.01 K/uL (ref 0.00–0.07)
Basophils Absolute: 0 K/uL (ref 0.0–0.1)
Basophils Relative: 1 %
Eosinophils Absolute: 0 K/uL (ref 0.0–0.5)
Eosinophils Relative: 1 %
HCT: 40.7 % (ref 36.0–46.0)
Hemoglobin: 13.9 g/dL (ref 12.0–15.0)
Immature Granulocytes: 0 %
Lymphocytes Relative: 20 %
Lymphs Abs: 0.9 K/uL (ref 0.7–4.0)
MCH: 32 pg (ref 26.0–34.0)
MCHC: 34.2 g/dL (ref 30.0–36.0)
MCV: 93.8 fL (ref 80.0–100.0)
Monocytes Absolute: 0.5 K/uL (ref 0.1–1.0)
Monocytes Relative: 12 %
Neutro Abs: 2.9 K/uL (ref 1.7–7.7)
Neutrophils Relative %: 66 %
Platelet Count: 157 K/uL (ref 150–400)
RBC: 4.34 MIL/uL (ref 3.87–5.11)
RDW: 16.9 % — ABNORMAL HIGH (ref 11.5–15.5)
WBC Count: 4.3 K/uL (ref 4.0–10.5)
nRBC: 0 % (ref 0.0–0.2)

## 2024-12-13 LAB — TOTAL PROTEIN, URINE DIPSTICK: Protein, ur: NEGATIVE mg/dL

## 2024-12-13 MED ORDER — SODIUM CHLORIDE 0.9 % IV SOLN: INTRAVENOUS | Status: DC

## 2024-12-13 MED ORDER — SODIUM CHLORIDE 0.9 % IV SOLN
10.0000 mg/kg | Freq: Once | INTRAVENOUS | Status: AC
  Administered 2024-12-13: 500 mg via INTRAVENOUS
  Filled 2024-12-13: qty 4

## 2024-12-13 MED ORDER — APREPITANT 130 MG/18ML IV EMUL
130.0000 mg | Freq: Once | INTRAVENOUS | Status: AC
  Administered 2024-12-13: 130 mg via INTRAVENOUS
  Filled 2024-12-13: qty 18

## 2024-12-13 MED ORDER — SODIUM CHLORIDE 0.9 % IV SOLN
306.0000 mg | Freq: Once | INTRAVENOUS | Status: AC
  Administered 2024-12-13: 310 mg via INTRAVENOUS
  Filled 2024-12-13: qty 31

## 2024-12-13 MED ORDER — DOXORUBICIN HCL LIPOSOMAL CHEMO INJECTION 2 MG/ML
30.0000 mg/m2 | Freq: Once | INTRAVENOUS | Status: AC
  Administered 2024-12-13: 44 mg via INTRAVENOUS
  Filled 2024-12-13: qty 22

## 2024-12-13 MED ORDER — CETIRIZINE HCL 10 MG/ML IV SOLN
10.0000 mg | Freq: Once | INTRAVENOUS | Status: AC
  Administered 2024-12-13: 10 mg via INTRAVENOUS
  Filled 2024-12-13: qty 1

## 2024-12-13 MED ORDER — DEXAMETHASONE SOD PHOSPHATE PF 10 MG/ML IJ SOLN
10.0000 mg | Freq: Once | INTRAMUSCULAR | Status: AC
  Administered 2024-12-13: 10 mg via INTRAVENOUS

## 2024-12-13 MED ORDER — FAMOTIDINE IN NACL 20-0.9 MG/50ML-% IV SOLN
20.0000 mg | Freq: Once | INTRAVENOUS | Status: AC
  Administered 2024-12-13: 20 mg via INTRAVENOUS
  Filled 2024-12-13: qty 50

## 2024-12-13 MED ORDER — PROCHLORPERAZINE MALEATE 10 MG PO TABS: 10.0000 mg | ORAL_TABLET | Freq: Four times a day (QID) | ORAL | 1 refills | Status: AC | PRN

## 2024-12-13 MED ORDER — PALONOSETRON HCL INJECTION 0.25 MG/5ML
0.2500 mg | Freq: Once | INTRAVENOUS | Status: AC
  Administered 2024-12-13: 0.25 mg via INTRAVENOUS
  Filled 2024-12-13: qty 5

## 2024-12-13 MED ORDER — DEXTROSE 5 % IV SOLN: INTRAVENOUS | Status: DC

## 2024-12-13 NOTE — Patient Instructions (Signed)
 [facility] - A DEPT OF . [facility]  Discharge Instructions: Thank you for choosing [facility] to provide your oncology and hematology care.   If you have a lab appointment with the Cancer Center, please go directly to the Cancer Center and check in at the registration area.   Wear comfortable clothing and clothing appropriate for easy access to any Portacath or PICC line.   We strive to give you quality time with your provider. You may need to reschedule your appointment if you arrive late (15 or more minutes).  Arriving late affects you and other patients whose appointments are after yours.  Also, if you miss three or more appointments without notifying the office, you may be dismissed from the clinic at the provider's discretion.      For prescription refill requests, have your pharmacy contact our office and allow 72 hours for refills to be completed.    Today you received the following chemotherapy and/or immunotherapy agents: CARBOplatin  (PARAPLATIN ) and DOXOrubicin HCL LIPOSOMAL   To help prevent nausea and vomiting after your treatment, we encourage you to take your nausea medication as directed.  BELOW ARE SYMPTOMS THAT SHOULD BE REPORTED IMMEDIATELY: *FEVER GREATER THAN 100.4 F (38 C) OR HIGHER *CHILLS OR SWEATING *NAUSEA AND VOMITING THAT IS NOT CONTROLLED WITH YOUR NAUSEA MEDICATION *UNUSUAL SHORTNESS OF BREATH *UNUSUAL BRUISING OR BLEEDING *URINARY PROBLEMS (pain or burning when urinating, or frequent urination) *BOWEL PROBLEMS (unusual diarrhea, constipation, pain near the anus) TENDERNESS IN MOUTH AND THROAT WITH OR WITHOUT PRESENCE OF ULCERS (sore throat, sores in mouth, or a toothache) UNUSUAL RASH, SWELLING OR PAIN  UNUSUAL VAGINAL DISCHARGE OR ITCHING   Items with * indicate a potential emergency and should be followed up as soon as possible or go to the Emergency Department if any problems should occur.  Please show the  CHEMOTHERAPY ALERT CARD or IMMUNOTHERAPY ALERT CARD at check-in to the Emergency Department and triage nurse.  Should you have questions after your visit or need to cancel or reschedule your appointment, please contact [facility] - A DEPT OF [facility]  Dept: [phone]  and follow the prompts.  Office hours are 8:00 a.m. to 4:30 p.m. Monday - Friday. Please note that voicemails left after 4:00 p.m. may not be returned until the following business day.  We are closed weekends and major holidays. You have access to a nurse at all times for urgent questions. Please call the main number to the clinic Dept: [phone] and follow the prompts.   For any non-urgent questions, you may also contact your provider using MyChart. We now offer e-Visits for anyone 2 and older to request care online for non-urgent symptoms. For details visit [email or web address].   Also download the MyChart app! Go to the app store, search MyChart, open the app, select [place], and log in with your MyChart username and password.  Doxorubicin Injection What is this medication? DOXORUBICIN (dox oh ROO bi sin) treats some types of cancer. It works by slowing down the growth of cancer cells. This medicine may be used for other purposes; ask your health care provider or pharmacist if you have questions. COMMON BRAND NAME(S): Adriamycin, Adriamycin PFS, Adriamycin RDF, Rubex What should I tell my care team before I take this medication? They need to know if you have any of these conditions: Heart disease History of low blood cell levels caused by a medication  Liver disease Recent or ongoing radiation An unusual or allergic reaction to doxorubicin, other medications, foods, dyes, or preservatives If you or your partner are pregnant or trying to get pregnant Breast-feeding How should I use this medication? This medication is injected into a vein. It is given by your care team in a  hospital or clinic setting. Talk to your care team about the use of this medication in children. Special care may be needed. Overdosage: If you think you have taken too much of this medicine contact a poison control center or emergency room at once. NOTE: This medicine is only for you. Do not share this medicine with others. What if I miss a dose? Keep appointments for follow-up doses. It is important not to miss your dose. Call your care team if you are unable to keep an appointment. What may interact with this medication? 6-mercaptopurine Paclitaxel  Phenytoin St. John's wort Trastuzumab Verapamil This list may not describe all possible interactions. Give your health care provider a list of all the medicines, herbs, non-prescription drugs, or dietary supplements you use. Also tell them if you smoke, drink alcohol, or use illegal drugs. Some items may interact with your medicine. What should I watch for while using this medication? Your condition will be monitored carefully while you are receiving this medication. You may need blood work while taking this medication. This medication may make you feel generally unwell. This is not uncommon as chemotherapy can affect healthy cells as well as cancer cells. Report any side effects. Continue your course of treatment even though you feel ill unless your care team tells you to stop. There is a maximum amount of this medication you should receive throughout your life. The amount depends on the medical condition being treated and your overall health. Your care team will watch how much of this medication you receive. Tell your care team if you have taken this medication before. Your urine may turn red for a few days after your dose. This is not blood. If your urine is dark or brown, call your care team. In some cases, you may be given additional medications to help with side effects. Follow all directions for their use. This medication may increase your  risk of getting an infection. Call your care team for advice if you get a fever, chills, sore throat, or other symptoms of a cold or flu. Do not treat yourself. Try to avoid being around people who are sick. This medication may increase your risk to bruise or bleed. Call your care team if you notice any unusual bleeding. Talk to your care team about your risk of cancer. You may be more at risk for certain types of cancers if you take this medication. Talk to your care team if you or your partner may be pregnant. Serious birth defects can occur if you take this medication during pregnancy and for 6 months after the last dose. Contraception is recommended while taking this medication and for 6 months after the last dose. Your care team can help you find the option that works for you. If your partner can get pregnant, use a condom while taking this medication and for 6 months after the last dose. Do not breastfeed while taking this medication. This medication may cause infertility. Talk to your care team if you are concerned about your fertility. What side effects may I notice from receiving this medication? Side effects that you should report to your care team as soon as possible: Allergic reactions--skin  rash, itching, hives, swelling of the face, lips, tongue, or throat Heart failure--shortness of breath, swelling of the ankles, feet, or hands, sudden weight gain, unusual weakness or fatigue Heart rhythm changes--fast or irregular heartbeat, dizziness, feeling faint or lightheaded, chest pain, trouble breathing Infection--fever, chills, cough, sore throat, wounds that don't heal, pain or trouble when passing urine, general feeling of discomfort or being unwell Low red blood cell level--unusual weakness or fatigue, dizziness, headache, trouble breathing Painful swelling, warmth, or redness of the skin, blisters or sores at the infusion site Unusual bruising or bleeding Side effects that usually do not  require medical attention (report to your care team if they continue or are bothersome): Diarrhea Hair loss Nausea Pain, redness, or swelling with sores inside the mouth or throat Red urine This list may not describe all possible side effects. Call your doctor for medical advice about side effects. You may report side effects to FDA at 1-800-FDA-1088. Where should I keep my medication? This medication is given in a hospital or clinic. It will not be stored at home. NOTE: This sheet is a summary. It may not cover all possible information. If you have questions about this medicine, talk to your doctor, pharmacist, or health care provider.  2024 Elsevier/Gold Standard (2023-03-02 00:00:00)Carboplatin  Injection What is this medication? CARBOPLATIN  (KAR boe pla tin) treats some types of cancer. It works by slowing down the growth of cancer cells. This medicine may be used for other purposes; ask your health care provider or pharmacist if you have questions. COMMON BRAND NAME(S): Paraplatin  What should I tell my care team before I take this medication? They need to know if you have any of these conditions: Blood disorders Hearing problems Kidney disease Recent or ongoing radiation therapy An unusual or allergic reaction to carboplatin , cisplatin, other medications, foods, dyes, or preservatives Pregnant or trying to get pregnant Breast-feeding How should I use this medication? This medication is injected into a vein. It is given by your care team in a hospital or clinic setting. Talk to your care team about the use of this medication in children. Special care may be needed. Overdosage: If you think you have taken too much of this medicine contact a poison control center or emergency room at once. NOTE: This medicine is only for you. Do not share this medicine with others. What if I miss a dose? Keep appointments for follow-up doses. It is important not to miss your dose. Call your care team  if you are unable to keep an appointment. What may interact with this medication? Medications for seizures Some antibiotics, such as amikacin, gentamicin, neomycin, streptomycin, tobramycin  Vaccines This list may not describe all possible interactions. Give your health care provider a list of all the medicines, herbs, non-prescription drugs, or dietary supplements you use. Also tell them if you smoke, drink alcohol, or use illegal drugs. Some items may interact with your medicine. What should I watch for while using this medication? Your condition will be monitored carefully while you are receiving this medication. You may need blood work while taking this medication. This medication may make you feel generally unwell. This is not uncommon, as chemotherapy can affect healthy cells as well as cancer cells. Report any side effects. Continue your course of treatment even though you feel ill unless your care team tells you to stop. In some cases, you may be given additional medications to help with side effects. Follow all directions for their use. This medication may increase your risk  of getting an infection. Call your care team for advice if you get a fever, chills, sore throat, or other symptoms of a cold or flu. Do not treat yourself. Try to avoid being around people who are sick. Avoid taking medications that contain aspirin, acetaminophen , ibuprofen, naproxen, or ketoprofen unless instructed by your care team. These medications may hide a fever. Be careful brushing or flossing your teeth or using a toothpick because you may get an infection or bleed more easily. If you have any dental work done, tell your dentist you are receiving this medication. Talk to your care team if you wish to become pregnant or think you might be pregnant. This medication can cause serious birth defects. Talk to your care team about effective forms of contraception. Do not breast-feed while taking this medication. What  side effects may I notice from receiving this medication? Side effects that you should report to your care team as soon as possible: Allergic reactions--skin rash, itching, hives, swelling of the face, lips, tongue, or throat Infection--fever, chills, cough, sore throat, wounds that don't heal, pain or trouble when passing urine, general feeling of discomfort or being unwell Low red blood cell level--unusual weakness or fatigue, dizziness, headache, trouble breathing Pain, tingling, or numbness in the hands or feet, muscle weakness, change in vision, confusion or trouble speaking, loss of balance or coordination, trouble walking, seizures Unusual bruising or bleeding Side effects that usually do not require medical attention (report to your care team if they continue or are bothersome): Hair loss Nausea Unusual weakness or fatigue Vomiting This list may not describe all possible side effects. Call your doctor for medical advice about side effects. You may report side effects to FDA at 1-800-FDA-1088. Where should I keep my medication? This medication is given in a hospital or clinic. It will not be stored at home. NOTE: This sheet is a summary. It may not cover all possible information. If you have questions about this medicine, talk to your doctor, pharmacist, or health care provider.  2024 Elsevier/Gold Standard (2022-03-22 00:00:00)

## 2024-12-13 NOTE — Telephone Encounter (Signed)
 Called and left a message asking her son to call the office back. Asking if Joen has any Zofran / Compazine  that she can take for nausea. Ask him to call the office back for questions.  Left radiology scheduling # to call and schedule CT on 1/23.

## 2024-12-13 NOTE — Telephone Encounter (Signed)
 Son called back. He will schedule CT scan. Joen is stating that she had nausea after treatment last time. Per son this is the first time that he has heard of the nausea after treatment. She has Zofran  Rx at the facility. Compazine  Rx sent to preferred pharmacy. The facility will monitor for nausea.

## 2024-12-13 NOTE — Progress Notes (Signed)
 Nutrition Assessment   Reason for Assessment:   Add on from RN, regarding foods to choose with nausea   ASSESSMENT:  79 year old female with stage III ovarian cancer, s/p debulking surgery, adjuvant chemotherapy completed in May 2025, now with recurrence.    Spoke with patient via phone as RD at another location.  Patient lives in Assisted Living facility with all meals prepared for her.  Says that she has a small refrigerator in her apartment that is full of foods.  Reports that first infusion she felt fine but second infusion she reports extreme fatigue and says she was not drinking enough.  Son has gotten her some flavored waters that she enjoys.  Drinks a protein shake daily but not sure name of it.  Also felt a little nauseated with second treatment.  Unsure if she has nausea medication or not.  Says that a nurse at the facility gives her medication each day.     Medications: zofran , compazine , lactulose    Labs: reviewed   Anthropometrics:   Height: 61 inches Weight: 110 lb UBW: 104-110 BMI: 20   Estimated Energy Needs  Kcals: 1250-1500 Protein: 60-75 g Fluid: 1250-1533ml   NUTRITION DIAGNOSIS: Food and nutrition related knowledge deficit related to cancer treatment as evidenced by consult with RD   INTERVENTION:  Encouraged continued hydration Encouraged small frequent snacks/mini meals q 2 hours to keep something on stomach.  Discussed foods to choose with nausea.  Will email Nausea and Vomiting Handout with food list and discussed strategies.  Continue protein shakes. Noted nursing reaching out to son to check on nausea medication.  Contact information provided   MONITORING, EVALUATION, GOAL: weight trends, intake   Next Visit: as needed  Casee Knepp B. Dasie SOLON, CSO, LDN Registered Dietitian [phone]

## 2024-12-14 ENCOUNTER — Encounter: Payer: Self-pay | Admitting: Hematology and Oncology

## 2024-12-14 DIAGNOSIS — R159 Full incontinence of feces: Secondary | ICD-10-CM | POA: Insufficient documentation

## 2024-12-14 LAB — CA 125: Cancer Antigen (CA) 125: 104 U/mL — ABNORMAL HIGH (ref 0.0–38.1)

## 2024-12-14 NOTE — Progress Notes (Signed)
 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  She completed adjuvant chemotherapy with carboplatin  and paclitaxel  in May 2025 Unfortunately, recent tumor marker was elevated I reviewed repeat CT imaging of the abdomen and pelvis with the patient and family which unfortunately shows signs of recurrent disease  She tolerated last 2 cycles of treatment without difficulties except for progressive decline in cognition Appointment to see neuro oncologist is pending  With recent treatment, she complained of nausea Her blood count is satisfactory We will proceed with treatment I will order CT imaging to be done before her next cycle Cognitive decline She appears to have poor memory Neurology consult is pending Urinary and fecal incontinence She complained of recent urinary and fecal incontinence I suspect this is due to pelvic floor laxity I will consult physical therapy for pelvic floor exercises  Orders Placed This Encounter  Procedures   CT CHEST ABDOMEN PELVIS W CONTRAST    Standing Status:   Future    Expected Date:   01/03/2025    Expiration Date:   12/13/2025    If indicated for the ordered procedure, I authorize the administration of contrast media per Radiology protocol:   Yes    Does the patient have a contrast media/X-ray dye allergy?:   No    Preferred imaging location?:   [facility]    If indicated for the ordered procedure, I authorize the administration of oral contrast media per Radiology protocol:   No    Reason for no oral  contrast::   No need oral contrast   Ambulatory referral to Physical Therapy    Referral Priority:   Routine    Referral Type:   Physical Medicine    Referral Reason:   Specialty Services Required    Requested Specialty:   Physical Therapy    Number of Visits Requested:   1     Hicks Lonn, MD  INTERVAL HISTORY: she returns for treatment follow-up Complications related to previous cycle of chemotherapy included nausea with/without vomiting, and incontinence and poor memory She is here by herself I spoke with the patient and discussed plan of care with her son over the phone She told us  she had nausea but she did not tell her son she had nausea We have to call and verify she has antiemetics She complained of incontinence episode Denies recent mouth sores, changes in bowel habits or infection  PHYSICAL EXAMINATION: ECOG PERFORMANCE STATUS: 1 - Symptomatic but completely ambulatory  Lab Results  Component Value Date   CAN125 104.0 (H) 12/13/2024   CAN125 92.0 (H) 11/15/2024   CAN125 44.6 (H) 10/02/2024      Latest Ref Rng & Units 12/13/2024    2:01 PM 11/15/2024    7:55 AM 10/18/2024    7:59 AM  CBC  WBC 4.0 - 10.5 K/uL 4.3  4.5  6.3   Hemoglobin 12.0 - 15.0 g/dL 86.0  84.7  85.0   Hematocrit 36.0 - 46.0 % 40.7  44.7  45.0   Platelets 150 - 400 K/uL 157  140  173       Chemistry  Component Value Date/Time   NA 139 12/13/2024 1401   K 4.3 12/13/2024 1401   CL 102 12/13/2024 1401   CO2 27 12/13/2024 1401   BUN 16 12/13/2024 1401   CREATININE 0.83 12/13/2024 1401      Component Value Date/Time   CALCIUM  9.4 12/13/2024 1401   ALKPHOS 82 12/13/2024 1401   AST 31 12/13/2024 1401   ALT 19 12/13/2024 1401   BILITOT 0.5 12/13/2024 1401       There were no vitals filed for this visit. There were no vitals filed for this visit. Other relevant data reviewed during this visit included CBC, CMP, CA125

## 2024-12-14 NOTE — Assessment & Plan Note (Addendum)
 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

## 2024-12-14 NOTE — Assessment & Plan Note (Addendum)
 She appears to have poor memory Neurology consult is pending

## 2024-12-14 NOTE — Assessment & Plan Note (Addendum)
 She complained of recent urinary and fecal incontinence I suspect this is due to pelvic floor laxity I will consult physical therapy for pelvic floor exercises

## 2024-12-16 ENCOUNTER — Inpatient Hospital Stay: Admitting: Internal Medicine

## 2024-12-16 VITALS — BP 123/84 | HR 83 | Temp 98.0°F | Resp 16 | Ht 61.0 in | Wt 111.0 lb

## 2024-12-16 DIAGNOSIS — Z5111 Encounter for antineoplastic chemotherapy: Secondary | ICD-10-CM | POA: Diagnosis not present

## 2024-12-16 DIAGNOSIS — G3184 Mild cognitive impairment, so stated: Secondary | ICD-10-CM | POA: Diagnosis not present

## 2024-12-16 NOTE — Progress Notes (Signed)
 "  [facility] [address]  [place], [place] [address] [phone]   Cognitive Evaluation  Date of Service: 12/16/2024 Patient Name: [facility] MRN: [id number] Patient DOB: 03-Jul-1946 Provider: Arthea MARLA Manns, MD  Identifying Statement:  Lori Conrad is a 79 y.o. female who presents for initial consultation and evaluation regarding cancer associated cognitive decline.    Referring Provider: Lonn Hicks, MD [address] 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 EXAM: IMPLANTED PORT A CATH PLACEMENT WITH ULTRASOUND AND FLUOROSCOPIC GUIDANCE COMPARISON:  None Available. MEDICATIONS: None. ANESTHESIA/SEDATION: Moderate (conscious) sedation was employed during this procedure. A total of Versed  1 mg and Fentanyl  50 mcg was administered intravenously. Moderate Sedation Time: 15 minutes. The patient's level of consciousness and vital signs were monitored continuously by radiology nursing throughout the procedure under my direct supervision. CONTRAST:  None FLUOROSCOPY TIME:  One mGy COMPLICATIONS: None immediate. PROCEDURE: The procedure, risks, benefits, and alternatives were explained to the patient. Questions regarding the procedure were encouraged and answered. The patient understands and consents to the procedure. The right neck and chest were prepped with chlorhexidine  in a sterile fashion, and a sterile drape was applied covering the operative field. Maximum barrier sterile technique with sterile gowns and gloves were used for the  procedure. A timeout was performed prior to the initiation of the procedure. Ultrasound was used to examine the jugular vein which was compressible and free of internal echoes. A skin marker was used to demarcate the planned venotomy and port pocket incision sites. Local anesthesia was provided to these sites and the subcutaneous tunnel track with 1% lidocaine  with 1:100,000 epinephrine . A small incision was created at the jugular access site and blunt dissection was performed of the subcutaneous tissues. Under ultrasound guidance, the jugular vein was accessed with a 21 ga micropuncture needle and an 0.018 wire was inserted to the superior vena cava. Real-time ultrasound guidance was utilized for vascular access including the acquisition of a permanent ultrasound image documenting patency of the accessed vessel. A 5 Fr micopuncture set was then used, through which a 0.035 Rosen wire was passed under fluoroscopic guidance into the inferior vena cava. An 8 Fr dilator was then placed over the wire. A subcutaneous port pocket was then created along the upper chest wall utilizing a combination of sharp and blunt dissection. The pocket was irrigated with sterile saline, packed with gauze, and observed for hemorrhage. A single lumen plastic power injectable port was chosen for placement. The 8 Fr catheter was tunneled from the port pocket site to the venotomy incision. The port was placed in the pocket. The external catheter was trimmed to appropriate length. The dilator was exchanged for an 8 Fr peel-away sheath under fluoroscopic guidance. The catheter was then placed through the sheath and the sheath was removed. Final catheter positioning was confirmed and documented with a fluoroscopic spot radiograph. The port was accessed with a Huber needle, aspirated, and flushed with heparinized saline. The deep dermal layer of the port pocket incision was closed with interrupted 3-0 Vicryl suture. Dermabond was then placed  over the port pocket and neck  incisions. The patient tolerated the procedure well without immediate post procedural complication. FINDINGS: After catheter placement, the tip lies within the superior cavoatrial junction. The catheter aspirates and flushes normally and is ready for immediate use. IMPRESSION: Successful placement of a power injectable Port-A-Cath via the right internal jugular vein. The catheter is ready for immediate use. Ester Sides, MD [facility] Electronically Signed   By: Ester Sides M.D.   On: 11/01/2023 13:22   MR ABDOMEN W WO CONTRAST  Result Date: 10/31/2023 CLINICAL DATA:  Pelvic mass.  Liver lesion EXAM: MRI ABDOMEN WITHOUT AND WITH CONTRAST TECHNIQUE: Multiplanar multisequence MR imaging of the abdomen was performed both before and after the administration of intravenous contrast. CONTRAST:  5mL GADAVIST  GADOBUTROL  1 MMOL/ML IV SOLN COMPARISON:  CT 10/30/2023 and older. FINDINGS: Lower chest: Trace pleural fluid. Hepatobiliary: Numerous bright T2, low T1 nonenhancing foci identified consistent with benign cystic lesions. Many of these are under 15 mm. There are some larger foci identified such as segment 4 measuring 4.3 cm and caudate measuring 2.8 cm. Prior CT did demonstrate 1 lesion which is more complex anteriorly in the left hepatic lobe, segment 3 which on today's examination when taking into account motion show some progressive enhancement, is bright on T2 but not as bright as simple fluid and consistent with a small hemangioma. No specific aggressive liver lesion clearly identified today. Patent portal vein. Gallbladder is nondilated. No biliary ductal dilatation. Pancreas: Ectatic pancreatic duct diffusely measuring up to 6 mm, severe. No pancreatic focal atrophy, abnormal enhancement or abnormal T1 signal. No restricted diffusion along the pancreas. Spleen:  Within normal limits in size and appearance.  Adrenals/Urinary Tract: Adrenal glands are preserved. No enhancing renal mass or collecting system dilatation. Stomach/Bowel: Visualized bowel is nondilated. This includes visualized portions of the small and large bowel. The stomach is underdistended. Vascular/Lymphatic: Normal caliber aorta and IVC. Atherosclerotic changes along the aorta. Circumaortic left renal vein. Once again there is a abnormal lymph node identified anterior to the aorta in the upper abdomen on series 1602, image 58 measuring 2.3 x 1.8 cm. Few other small retroperitoneal nodes identified. Other:  Trace ascites.  Mesenteric stranding. Musculoskeletal: Curvature and degenerative changes along the spine. IMPRESSION: Multiple benign-appearing liver lesions including cysts and 1 hemangioma. Persistent enlarged upper abdominal retroperitoneal lymph node. Additional smaller but prominent nodes as well. With the pelvic findings these are worrisome for potential spread of neoplasm. Mild ascites. Electronically Signed   By: Ranell Bring M.D.   On: 10/31/2023 14:10   MR PELVIS W WO CONTRAST  Result Date: 10/31/2023 CLINICAL DATA:  Pelvic mass EXAM: MRI PELVIS WITHOUT AND WITH CONTRAST TECHNIQUE: Multiplanar multisequence MR imaging of the pelvis was performed both before and after administration of intravenous contrast. CONTRAST:  5mL GADAVIST  GADOBUTROL  1 MMOL/ML IV SOLN COMPARISON:  CT 10/30/2019 FINDINGS: Urinary Tract: Bladder is mildly distended with fluid. There is some mass effect along the posterior aspect of the bladder related to the adjacent complex mass. The bladder itself appears grossly intact. No abnormal wall enhancement. Grossly preserved course of the urethra. Bowel: The visualized bowel in the pelvis is nondilated. However there is lobular masslike area along the distal sigmoid colon with the areas of heterogeneous enhancement. The dominant lesion in this location on series 4, image 29 measures 3.7 by 2.6 cm. There are several  adjacent soft tissue nodules as well such as just posterior to the cervix and anterior to the bowel measuring 10  mm on series 22 image 30. Focus superior left lateral series 22, image 26 measures 2.3 x 1.7 cm. Additional foci elsewhere dependently in the pelvis including the presacral space. Vascular/Lymphatic: Atherosclerotic changes identified along the iliac vessels. No separate nodal enlargement identified. Reproductive: Uterus measures 8.2 by 2.0 by 3.4 cm. Endometrial stripe is less than 3 mm. Slightly heterogeneous myometrium. Anterior to the uterus is a large complex cystic and solid mass with heterogeneous enhancement of the solid component. Lesion measures 11.5 by 7.6 by 7.4 cm. The more solid component is right lateral inferior with a aggressive in enhancement measuring proximally 7.7 by 5.6 cm. Cephalocaudal length 8.5 cm. The cystic component more towards the left has a dimensions approaching 8.7 cm. There is surrounding free fluid and edema. Although this more in the central pelvis towards midline a right ovary is not seen as a separate structure. There is what appears to be a small postmenopausal of the left ovary measuring 15 mm. An ovarian neoplasm is a strong consideration. Other:  Small amount of free fluid in the pelvis.  Edema. Musculoskeletal: Curvature of the spine. Moderate degenerative changes of the lumbar spine with disc bulging and areas of stenosis greatest at L4-5. Degenerative changes of the pelvis and hips as well. Study is somewhat limited due to some artifacts postcontrast axial dataset as well as study being performed as a standard pelvis rather than a gynecologic pelvis exam. Please see separate dictation of abdomen MRI. IMPRESSION: Large complex cystic and solid pelvic mass centrally measuring up to 11.5 x 7.6 x 7.4 cm. Based on overall appearance this has worrisome for a neoplasm including an ovarian malignancy. Small amount of ascites in the pelvis. Soft tissue enhancing  aggressive nodules along the course of the sigmoid colon as well as in the adjacent fat and presacral spaces. With the larger central mass this very well could be spread of disease to adjacent structures rather than a primary colonic process but correlate with symptoms and if needed colonoscopy. Please see separate dictation of abdominal MRI. Electronically Signed   By: Ranell Bring M.D.   On: 10/31/2023 14:01   CT CHEST W CONTRAST  Result Date: 10/31/2023 CLINICAL DATA:  79 year old female with suspected gynecologic malignancy. * Tracking Code: BO * EXAM: CT CHEST WITH CONTRAST TECHNIQUE: Multidetector CT imaging of the chest was performed during intravenous contrast administration. RADIATION DOSE REDUCTION: This exam was performed according to the departmental dose-optimization program which includes automated exposure control, adjustment of the mA and/or kV according to patient size and/or use of iterative reconstruction technique. CONTRAST:  75mL OMNIPAQUE  IOHEXOL  350 MG/ML SOLN COMPARISON:  None Available. FINDINGS: Cardiovascular: The heart size is mildly enlarged. No pericardial effusion. Aortic atherosclerosis and coronary artery calcification. Mediastinum/Nodes: Trachea and esophagus appear unremarkable. The right lobe of thyroid  gland appears surgically absent. No mediastinal or hilar adenopathy. Lungs/Pleura: No pleural effusion identified. Subsegmental atelectasis identified within the lingula and bilateral posterior lung bases. No signs of interstitial edema or airspace consolidation. No suspicious pulmonary nodule identified to suggest lung metastases. Upper Abdomen: No acute abnormality. Multiple liver cysts. Enlarged lymph node within the portal caval region measures 1.5 cm, image 155/3. Defer to report from CT AP dated 10/30/2019 for and same-day MRI of the abdomen pelvis for further details. Musculoskeletal: Mild curvature of the thoracic spine and lumbar spine is convex towards the left.  Multilevel degenerative disc disease. No acute or suspicious osseous lesions. IMPRESSION: 1. No signs of metastatic disease to the chest. 2. Areas  of subsegmental atelectasis noted within bilateral posterior lung bases and lingula. 3. Enlarged upper abdominal lymph node as above. In the setting of a known malignancy this is concerning for nodal metastasis. 4. Coronary artery calcifications. 5.  Aortic Atherosclerosis (ICD10-I70.0). Electronically Signed   By: Waddell Calk M.D.   On: 10/31/2023 05:57   CT ABDOMEN PELVIS W CONTRAST  Result Date: 10/30/2023 CLINICAL DATA:  Abdominal pain. EXAM: CT ABDOMEN AND PELVIS WITH CONTRAST TECHNIQUE: Multidetector CT imaging of the abdomen and pelvis was performed using the standard protocol following bolus administration of intravenous contrast. RADIATION DOSE REDUCTION: This exam was performed according to the departmental dose-optimization program which includes automated exposure control, adjustment of the mA and/or kV according to patient size and/or use of iterative reconstruction technique. CONTRAST:  75mL OMNIPAQUE  IOHEXOL  350 MG/ML SOLN COMPARISON:  Limb 1424 FINDINGS: Lower chest: No acute findings. Hepatobiliary: Multiple hepatic cysts evident. Scattered tiny hypodensities in the liver parenchyma are too small to characterize but are statistically most likely benign. No followup imaging is recommended. Tiny subcapsular lesion measured previously at 9 mm in the anterior left liver is stable on image 23/3 today, nonspecific. There is no evidence for gallstones, gallbladder wall thickening, or pericholecystic fluid. No intrahepatic or extrahepatic biliary dilation. Pancreas: Dilatation of the pancreatic duct to the head and body of pancreas is similar to prior. Spleen: No splenomegaly. No suspicious focal mass lesion. Adrenals/Urinary Tract: No adrenal nodule or mass. Kidneys unremarkable. No evidence for hydroureter. Bladder is distended. Stomach/Bowel: Stomach  is unremarkable. No gastric wall thickening. No evidence of outlet obstruction. Duodenum is normally positioned as is the ligament of Treitz. No small bowel wall thickening. No small bowel dilatation. Diverticular changes are noted in the left colon without evidence of diverticulitis. Vascular/Lymphatic: 16 mm short axis portal caval lymph node seen on 22/3. No para-aortic lymphadenopathy. No pelvic sidewall lymphadenopathy. Reproductive: Multiple uterine fibroids evident. As noted on prior study there is an area of the anterior cervix that appears to obliterate the fat plane between the cervix and the posterior wall of the bladder (see sagittal 86/7). Small soft tissue nodules are again noted in the cul-de-sac some of which may pertain to diverticuli, but others raise concern for peritoneal nodularity (see images 61 and 56 of series 3). Other: No substantial free fluid. Musculoskeletal: No worrisome lytic or sclerotic osseous abnormality. IMPRESSION: 1. Multiple uterine fibroids. As noted on prior study there is an area of the anterior cervix that appears to obliterate the fat plane between the cervix and the posterior wall of the bladder. This is concerning for a cervical mass. Gynecologic consultation recommended. 2. Small soft tissue nodules in the cul-de-sac some of which may relate to diverticuli, but others raise concern for peritoneal nodularity. Attention on follow-up recommended. PET-CT may prove helpful to further evaluate 3. 16 mm short axis portal caval lymph node, metastatic disease not excluded. 4. Left colonic diverticulosis without diverticulitis. Electronically Signed   By: Camellia Candle M.D.   On: 10/30/2023 13:08   CT ABDOMEN PELVIS W CONTRAST  Result Date: 10/26/2023 CLINICAL DATA:  Pt w/ abnormal US ; mass in pelvis; no h/o cancer; no pain; no urinary issues EXAM: CT ABDOMEN AND PELVIS WITH CONTRAST TECHNIQUE: Multidetector CT imaging of the abdomen and pelvis was performed using the  standard protocol following bolus administration of intravenous contrast. RADIATION DOSE REDUCTION: This exam was performed according to the departmental dose-optimization program which includes automated exposure control, adjustment of the mA and/or kV according to  patient size and/or use of iterative reconstruction technique. CONTRAST:  100mL ISOVUE -300 IOPAMIDOL  (ISOVUE -300) INJECTION 61% COMPARISON:  Ultrasound pelvis 09/01/2023, ultrasound thyroid  10/10/2023 FINDINGS: Lower chest: Mitral annular calcification. Aortic valve leaflet calcification. No acute abnormality. Hepatobiliary: Multiple fluid density lesions scattered throughout the left right hepatic lobe. There an indeterminate 0.9 cm left hepatic lobe hypodensity with Hounsfield unit of 77 (2:29). No gallstones, gallbladder wall thickening, or pericholecystic fluid. No biliary dilatation. Pancreas: No focal lesion. Normal pancreatic contour. No surrounding inflammatory changes. No main pancreatic ductal dilatation. Spleen: Normal in size without focal abnormality. Adrenals/Urinary Tract: No adrenal nodule bilaterally. Bilateral kidneys enhance symmetrically. No hydronephrosis. No hydroureter. The urinary bladder is unremarkable. On delayed imaging, there is no urothelial wall thickening and there are no filling defects in the opacified portions of the bilateral collecting systems or ureters. Stomach/Bowel: Stomach is within normal limits. No evidence of small bowel wall thickening or dilatation. Increased stool burden proximal to the distal sigmoid colon mass with stool throughout the ascending, transverse, descending colon. Short segment of distal sigmoid colon irregular bowel wall thickening (5:26, 2:62). No large bowel luminal dilatation. Colonic diverticulosis appendix appears normal. Vascular/Lymphatic: No abdominal aorta or iliac aneurysm. Severe atherosclerotic plaque of the aorta and its branches with severe narrowing of the proximal celiac  artery (6:80). No abdominal, pelvic, or inguinal lymphadenopathy. Reproductive: There is a heterogeneous solid and cystic 11 x 9 cm mass arising from the uterine fundus. Finding is noted to invade into the urinary bladder dome (6:81, 5:56 close) where there is loss of intraperitoneal and lower ring of the urinary bladder wall margin. The mass is noted to abut and appears to be inseparable from a short segment of distal sigmoid colon in the region of irregular bowel wall thickening. Other: No intraperitoneal free fluid. No intraperitoneal free gas. No organized fluid collection. Musculoskeletal: No abdominal wall hernia or abnormality. No suspicious lytic or blastic osseous lesions. No acute displaced fracture. Multilevel severe degenerative changes of the spine. Grade 1 anterolisthesis of L4 on L5 and L5 on S1. Mild retrolisthesis of L2 on L3 and L3 on L4. Dextroscoliosis centered at the L3-L4 level. IMPRESSION: 1. An 11 x 9 cm heterogeneous solid and cystic mass arises from the uterine fundus and is noted to invade the urinary bladder dome wall as well as the distal sigmoid colon. No associated bowel obstruction; however, constipation proximal to irregular bowel wall thickening/mass. No associated stercoral colitis. Finding consistent with malignancy. Recommend gynecologic consultation. When the patient is clinically stable and able to follow directions and hold their breath (preferably as an outpatient) further evaluation with dedicated MRI with and without contrast should be considered. 2. Indeterminate 0.9 cm left hepatic lobe hypodense lesion with a density of 77 HU. Question metastasis versus primary hepatic lesion. 3. Stool throughout the colon 4. Colonic diverticulosis with no acute diverticulitis. 5. 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 6.  Aortic Atherosclerosis (ICD10-I70.0)-severe. These results will be called to the ordering clinician or representative by the Radiologist Assistant, and  communication documented in the PACS or [employer]. Electronically Signed   By: Morgane  Naveau M.D.   On: 10/26/2023 18:42   DG BONE DENSITY (DXA)  Result Date: 10/26/2023 EXAM: DUAL X-RAY ABSORPTIOMETRY (DXA) FOR BONE MINERAL DENSITY IMPRESSION: Referring Physician:  LAMAR NG Your patient completed a bone mineral density test using GE Lunar iDXA system (analysis version: 16). Technologist: BEC PATIENT: Name: Lori Conrad, Lori Conrad Patient ID: [id number] Birth Date: [birth year] Height: 60.5 in. Sex: Female  Measured: 10/26/2023 Weight: 110.2 lbs. Indications: Advanced Age, Caucasian, Estrogen Deficient, Height Loss (781.91), History of Osteoporosis, Levothyroxine , Postmenopausal Fractures: Left Ankle Treatments: Calcium  (E943.0), Vitamin D (E933.5) ASSESSMENT: The BMD measured at Forearm Radius 33% is 0.624 g/cm2 with a T-score of -2.9. This patient's diagnostic category is OSTEOPOROSIS according to World Health Organization [facility]) criteria. Comparison to 10/16/2019. Since the prior study, there has been a SIGNIFICANT DECREASE in bone mineral density of the hips (-4.6%). The lumbar spine was excluded due to being excluded from prior exam. The quality of the exam is good. Site Region Measured Date Measured Age YA BMD Significant CHANGE T-score Right Forearm Radius 33% 10/26/2023 78.0 -2.9 0.624 g/cm2 Right Forearm Radius 33% 10/16/2019 74.0 -2.6 0.653 g/cm2 DualFemur Neck Left 10/26/2023 78.0 -0.6 0.952 g/cm2 DualFemur Neck Left 10/16/2019 74.0 -0.5 0.962 g/cm2 DualFemur Total Mean 10/26/2023 78.0 -0.7 0.918 g/cm2 * DualFemur Total Mean 10/16/2019 74.0 -0.4 0.962 g/cm2 World Health Organization [facility]) criteria for post-menopausal, Caucasian Women: Normal       T-score at or above -1 SD Osteopenia   T-score between -1 and -2.5 SD Osteoporosis T-score at or below -2.5 SD RECOMMENDATION: 1. All patients should optimize calcium  and vitamin D intake. 2. Consider FDA-approved medical therapies in postmenopausal  women and men aged 59 years and older, based on the following: a. A hip or vertebral (clinical or morphometric) fracture. b. T-score = -2.5 at the femoral neck or spine after appropriate evaluation to exclude secondary causes. c. Low bone mass (T-score between -1.0 and -2.5 at the femoral neck or spine) and a 10-year probability of a hip fracture = 3% or a 10-year probability of a major osteoporosis-related fracture = 20% based on the [place] -adapted WHO algorithm. d. Clinician judgment and/or patient preferences may indicate treatment for people with 10-year fracture probabilities above or below these levels. FOLLOW-UP: Patients with diagnosis of osteoporosis or at high risk for fracture should have regular bone mineral density tests.? Patients eligible for Medicare are allowed routine testing every 2 years.? The testing frequency can be increased to one year for patients who have rapidly progressing disease, are receiving or discontinuing medical therapy to restore bone mass, or have additional risk factors. I have reviewed this study and agree with the findings. [facility] Electronically Signed   By: Reyes Phi M.D.   On: 10/26/2023 12:30      11/08/2023 Pathology Results   SURGICAL PATHOLOGY CASE: [id number] PATIENT: Lori Conrad Surgical Pathology Report  Clinical History: Pelvic mass, suspected malignancy (crm)   FINAL MICROSCOPIC DIAGNOSIS:  A. PELVIC SIDEWALL NODULE, RIGHT, EXCISION: - High-grade serous carcinoma, see comment  B. ABDOMINAL WALL #1, ANTERIOR, EXCISION: - High-grade serous carcinoma  COMMENT:  A.  Immunohistochemical stain show that the tumor cells are positive for CK7, PAX8, and p16 (diffuse overexpression).  Immunostain for p53 shows a clonal null expression pattern.  Immunostains for CK20 is negative. This immunoprofile is consistent with the above interpretation and suggestive of an ovarian primary.    11/14/2023 Initial Diagnosis   Malignant  ovarian neoplasm, right (HCC)   11/14/2023 Cancer Staging   Staging form: Ovary, Fallopian Tube, and Primary Peritoneal Carcinoma, AJCC 8th Edition - Clinical stage from 11/14/2023: FIGO Stage IIIC (cT3c, cN1, cM0) - Signed by Lonn Hicks, MD on 11/14/2023 Stage prefix: Initial diagnosis   11/20/2023 Tumor Marker   Patient's tumor was tested for the following markers: CA-125. Results of the tumor marker test revealed 169.   11/22/2023 - 05/03/2024 Chemotherapy  Patient is on Treatment Plan : OVARIAN Carboplatin  (AUC 6) + Paclitaxel  (175) q21d X [facility]cles     12/26/2023 Genetic Testing   Single pathogenic low penetrance variant detected in CHEK2 at  p.I157T (c.470T>C).  No other deleterious variants detected in [facility] CancerNext-Expanded +RNAinsight Panel.  Report date is 12/26/2023.   The CancerNext-Expanded gene panel offered by [facility] and includes sequencing, rearrangement, and RNA analysis for the following 76 genes: AIP, ALK, APC, ATM, AXIN2, BAP1, BARD1, BMPR1A, BRCA1, BRCA2, BRIP1, CDC73, CDH1, CDK4, CDKN1B, CDKN2A, CEBPA, CHEK2, CTNNA1, DDX41, DICER1, ETV6, FH, FLCN, GATA2, LZTR1, MAX, MBD4, MEN1, MET, MLH1, MSH2, MSH3, MSH6, MUTYH, NF1, NF2, NTHL1, PALB2, PHOX2B, PMS2, POT1, PRKAR1A, PTCH1, PTEN, RAD51C, RAD51D, RB1, RET, RUNX1, SDHA, SDHAF2, SDHB, SDHC, SDHD, SMAD4, SMARCA4, SMARCB1, SMARCE1, STK11, SUFU, TMEM127, TP53, TSC1, TSC2, VHL, and WT1 (sequencing and deletion/duplication); EGFR, HOXB13, KIT, MITF, PDGFRA, POLD1, and POLE (sequencing only); EPCAM and GREM1 (deletion/duplication only).     01/05/2024 Tumor Marker   Patient's tumor was tested for the following markers: CA-125. Results of the tumor marker test revealed 130.   01/18/2024 Imaging   CT ABDOMEN PELVIS W CONTRAST Result Date: 01/24/2024 CLINICAL DATA:  79 year old female with history of ovarian cancer status post chemotherapy. Follow-up study. * Tracking Code: BO * EXAM: CT ABDOMEN AND PELVIS WITH CONTRAST TECHNIQUE:  Multidetector CT imaging of the abdomen and pelvis was performed using the standard protocol following bolus administration of intravenous contrast. RADIATION DOSE REDUCTION: This exam was performed according to the departmental dose-optimization program which includes automated exposure control, adjustment of the mA and/or kV according to patient size and/or use of iterative reconstruction technique. CONTRAST:  80mL OMNIPAQUE  IOHEXOL  300 MG/ML  SOLN COMPARISON:  CT of the abdomen and pelvis 12/07/2023. FINDINGS: Lower chest: 2.8 x 0.7 cm new area of pleural-based nodularity in the right lower lobe (axial image 5 of series 2) concerning for probable metastatic lesion. Hepatobiliary: Multiple well-defined low-attenuation lesions scattered throughout the liver, largest of which are all compatible with simple cysts, measuring up to 4.4 x 2.7 cm in segment 4A. Multiple other subcentimeter low-attenuation lesions, too small to definitively characterize, but similar to the prior study and statistically likely to represent tiny cysts and/or biliary hamartomas. Gallbladder is unremarkable in appearance. Pancreas: No pancreatic mass. No pancreatic ductal dilatation. No pancreatic or peripancreatic fluid collections or inflammatory changes. Spleen: Unremarkable. Adrenals/Urinary Tract: Bilateral kidneys and bilateral adrenal glands are normal in appearance. No hydroureteronephrosis. Urinary bladder is unremarkable in appearance. Stomach/Bowel: Appearance of the stomach is normal. There is no pathologic dilatation of small bowel or colon. Numerous colonic diverticula are noted, without definite focal surrounding inflammatory changes to indicate an acute diverticulitis at this time. Previously noted mass in the sigmoid colon is not readily apparent on today's examination. The appendix is not confidently identified and may be surgically absent. Regardless, there are no inflammatory changes noted adjacent to the cecum to suggest  the presence of an acute appendicitis at this time. Vascular/Lymphatic: Atherosclerotic calcifications are noted throughout the abdominal aorta and pelvic vasculature. Retroaortic left renal vein (normal anatomical variant) incidentally noted. No definite lymphadenopathy confidently identified in the abdomen or pelvis. Reproductive: Status post hysterectomy. Left ovary is unremarkable in appearance. Right adnexal mass has regressed when compared to the prior examination, currently measuring 7.8 x 7.4 x 7.7 cm (axial image 51 of series 2 and coronal image 51 of series 5). Other: Previously noted peritoneal implants in the low pelvis appear to have regressed compared  to the prior examination. No new peritoneal implants confidently identified. No significant volume of ascites. No pneumoperitoneum. Musculoskeletal: There are no aggressive appearing lytic or blastic lesions noted in the visualized portions of the skeleton. IMPRESSION: 1. Today's study demonstrates a positive response to therapy with regression of previously noted right adnexal mass and peritoneal implants in the low pelvis. 2. However, there is a new pleural-based nodule in the right lower lobe concerning for a metastatic lesion. 3. Colonic diverticulosis without evidence of acute diverticulitis at this time. 4. Aortic atherosclerosis. Aortic Atherosclerosis (ICD10-I70.0). Electronically Signed   By: Toribio Aye M.D.   On: 01/24/2024 10:40      01/26/2024 Tumor Marker   Patient's tumor was tested for the following markers: CA-125. Results of the tumor marker test revealed 60.6.   02/03/2024 Imaging   CT Chest W Contrast Result Date: 02/08/2024 CLINICAL DATA:  Pulmonary nodule. Ovarian cancer. * Tracking Code: BO * EXAM: CT CHEST WITH CONTRAST TECHNIQUE: Multidetector CT imaging of the chest was performed during intravenous contrast administration. RADIATION DOSE REDUCTION: This exam was performed according to the departmental  dose-optimization program which includes automated exposure control, adjustment of the mA and/or kV according to patient size and/or use of iterative reconstruction technique. CONTRAST:  75mL OMNIPAQUE  IOHEXOL  300 MG/ML  SOLN COMPARISON:  CT abdomen pelvis 01/18/2024 and CT chest 10/30/2023. FINDINGS: Cardiovascular: Right IJ Port-A-Cath terminates in the low SVC. Atherosclerotic calcification of the aorta, aortic valve and coronary arteries. Heart is at the upper limits of normal in size to mildly enlarged. No pericardial effusion. Enlarged pulmonic trunk. Mediastinum/Nodes: No pathologically enlarged mediastinal, hilar or axillary lymph nodes. Esophagus is grossly unremarkable. Lungs/Pleura: Subpleural scarring in the medial right lower lobe with somewhat of a plaque like area superiorly, measuring 0.7 x 2.7 cm (8/109). Finding is unchanged from 01/18/2024 and new from 10/30/2023. Otherwise, no suspicious pulmonary nodules. No pleural fluid. Airway is unremarkable. Upper Abdomen: Multiple hepatic cysts. Visualized portions of the liver, adrenal glands, kidneys, spleen, pancreas, stomach and bowel are otherwise grossly unremarkable. No upper abdominal adenopathy. Musculoskeletal: Degenerative changes in the spine. No worrisome lytic or sclerotic lesions. IMPRESSION: 1. Subpleural scarring in the medial right lower lobe with somewhat of a plaque-like area superiorly, as on 01/18/2024, new from 10/30/2023. Scarring or loculated pleural fluid is favored. Metastatic disease cannot be excluded. Recommend attention on follow-up. 2. Otherwise, no evidence of metastatic disease in the chest. 3. Aortic atherosclerosis (ICD10-I70.0). Coronary artery calcification. 4. Enlarged pulmonic trunk, indicative of pulmonary arterial hypertension. Electronically Signed   By: Newell Eke M.D.   On: 02/08/2024 14:09   CT ABDOMEN PELVIS W CONTRAST Result Date: 01/24/2024 CLINICAL DATA:  79 year old female with history of ovarian  cancer status post chemotherapy. Follow-up study. * Tracking Code: BO * EXAM: CT ABDOMEN AND PELVIS WITH CONTRAST TECHNIQUE: Multidetector CT imaging of the abdomen and pelvis was performed using the standard protocol following bolus administration of intravenous contrast. RADIATION DOSE REDUCTION: This exam was performed according to the departmental dose-optimization program which includes automated exposure control, adjustment of the mA and/or kV according to patient size and/or use of iterative reconstruction technique. CONTRAST:  80mL OMNIPAQUE  IOHEXOL  300 MG/ML  SOLN COMPARISON:  CT of the abdomen and pelvis 12/07/2023. FINDINGS: Lower chest: 2.8 x 0.7 cm new area of pleural-based nodularity in the right lower lobe (axial image 5 of series 2) concerning for probable metastatic lesion. Hepatobiliary: Multiple well-defined low-attenuation lesions scattered throughout the liver, largest of which are all compatible  with simple cysts, measuring up to 4.4 x 2.7 cm in segment 4A. Multiple other subcentimeter low-attenuation lesions, too small to definitively characterize, but similar to the prior study and statistically likely to represent tiny cysts and/or biliary hamartomas. Gallbladder is unremarkable in appearance. Pancreas: No pancreatic mass. No pancreatic ductal dilatation. No pancreatic or peripancreatic fluid collections or inflammatory changes. Spleen: Unremarkable. Adrenals/Urinary Tract: Bilateral kidneys and bilateral adrenal glands are normal in appearance. No hydroureteronephrosis. Urinary bladder is unremarkable in appearance. Stomach/Bowel: Appearance of the stomach is normal. There is no pathologic dilatation of small bowel or colon. Numerous colonic diverticula are noted, without definite focal surrounding inflammatory changes to indicate an acute diverticulitis at this time. Previously noted mass in the sigmoid colon is not readily apparent on today's examination. The appendix is not confidently  identified and may be surgically absent. Regardless, there are no inflammatory changes noted adjacent to the cecum to suggest the presence of an acute appendicitis at this time. Vascular/Lymphatic: Atherosclerotic calcifications are noted throughout the abdominal aorta and pelvic vasculature. Retroaortic left renal vein (normal anatomical variant) incidentally noted. No definite lymphadenopathy confidently identified in the abdomen or pelvis. Reproductive: Status post hysterectomy. Left ovary is unremarkable in appearance. Right adnexal mass has regressed when compared to the prior examination, currently measuring 7.8 x 7.4 x 7.7 cm (axial image 51 of series 2 and coronal image 51 of series 5). Other: Previously noted peritoneal implants in the low pelvis appear to have regressed compared to the prior examination. No new peritoneal implants confidently identified. No significant volume of ascites. No pneumoperitoneum. Musculoskeletal: There are no aggressive appearing lytic or blastic lesions noted in the visualized portions of the skeleton. IMPRESSION: 1. Today's study demonstrates a positive response to therapy with regression of previously noted right adnexal mass and peritoneal implants in the low pelvis. 2. However, there is a new pleural-based nodule in the right lower lobe concerning for a metastatic lesion. 3. Colonic diverticulosis without evidence of acute diverticulitis at this time. 4. Aortic atherosclerosis. Aortic Atherosclerosis (ICD10-I70.0). Electronically Signed   By: Toribio Aye M.D.   On: 01/24/2024 10:40      02/20/2024 Pathology Results   SURGICAL PATHOLOGY  CASE: [phone]  PATIENT: Lori Conrad  Surgical Pathology Report   Clinical History: ovarian cancer   FINAL MICROSCOPIC DIAGNOSIS:   A. DISTAL RECTAL BIOPSY:  Peritoneum and fibroadipose tissue involved by carcinoma consistent with gynecologic primary.       See comment.   B. SIGMOID COLON BIOPSY:        Fibromuscular tissue with lymphohistiocytic inflammation.       Negative for malignancy.   C. UTERUS, CERVIX, BILATERAL FALLOPIAN TUBES AND OVARIES, ABDOMINAL  HYSTERTCTOMY AND BILATERAL SALPINGO-OOPHORECTOMY:   Right ovary:  Predominant necrosis with rare degenerated glandular architecture and calcifications consistent with treatment effect.            No residual viable carcinoma identified.            See oncology table.   Cervix:           Unremarkable.           Negative for dysplasia or malignancy.       Endocervix:           Nabothian cysts.           Negative for hyperplasia, atypia or malignancy.       Endometrium:           Benign inactive endometrium.  Negative for hyperplasia, atypia or malignancy.       Myometrium:           Unremarkable.           Negative for malignancy.       Serosa:           Unremarkable.           Negative for malignancy.       Bilateral fallopian tubes:           Benign fimbriated fallopian tubes.           Negative for malignancy.       Left ovary:           Unremarkable.           Negative for malignancy.  D. OMENTUM:      Benign adipose tissue, negative for malignancy.  E. APPENDIX:      Benign vermiform appendix without significant diagnostic alteration.      Negative for malignancy.  ONCOLOGY TABLE:  OVARY or FALLOPIAN TUBE or PRIMARY PERITONEUM: Resection  Procedure: Total hysterectomy and bilateral salpingo-oophorectomy Specimen Integrity: Intact Tumor Site: Right ovary Tumor Size: 9.5 cm necrotic mass without viable carcinoma Histologic Type: Pre-treated diagnosis: high-grade serous carcinoma Histologic Grade: High-grade Ovarian Surface Involvement: NA Fallopian Tube Surface Involvement: Not identified Implants: Not applicable Lymphatic and/or Vascular Invasion:  Not identified Other Tissue/ Organ Involvement: Peritoneum of the pericolorectal soft tissue Largest Extrapelvic Peritoneal Focus: Not  applicable Peritoneal/Ascitic Fluid Involvement: Identified ([id number]) Chemotherapy Response Score (CRS): CRS3 (marked response with no residual cancer) Regional Lymph Nodes: Not applicable (no lymph nodes submitted or found)      Number of Nodes with Metastasis Greater than 10 mm: NA      Number of Nodes with Metastasis 10 mm or Less (excludes isolated tumor cells): NA  Number of Nodes with Isolated Tumor Cells (0.2 mm or less): NA      Number of Lymph Nodes Examined: 0 Distant Metastasis:      Distant Site(s) Involved: None Pathologic Stage Classification (pTNM, AJCC 8th Edition): ypT2b, ypNx Ancillary Studies: Can be performed upon request Representative Tumor Block: A1 (Peritoneum of the pericolorectal soft tissue) Comment(s): See comment ([id number])  COMMENT:  The right ovary contains a mass lesion with predominant necrosis and rare degenerated glandular architecture with calcifications. The specimen is extensively sampled and there is no residual viable carcinoma identified in the right ovary. However, the distal rectal biopsy (part A) contains foci of crushed cells that are positive for CK AE1/AE3, PAX8, and WT-1 by immunohistochemical stains. P53 mutant type of staining (null). 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 The staging is rendered as T2b duo to peritoneal involvement in the rectum (pelvic extension below pelvic brim).     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  On entry to abdomen, normal upper  abdominal survey with smooth diaphragm, liver, stomach and normal appearing omentum and bowel.  Upon visualization into the pelvis, multiple loops of small bowel adherent to the right adnexal mass, initially occluding view of mass.  Following enterolysis, approximately 8 cm right ovarian mass identified to demonstrate torsion with necrosis, adherent to the right anterior cul-de-sac and bladder serosa.  Otherwise small normal-appearing uterus, left fallopian tube and ovary. Retroperitoneal fibrosis on the right, requiring right ureterolysis. Colon with diverticulosis.  Two approximately 1 cm areas on the sigmoid and rectum (at the peritoneal reflection) with evidence of treatment effect without overt active disease, both areas excised.  Appendix adherent to the necrotic pelvic mass with disruption and serosa on lysis of adhesions, removed.  Small bowel run with no evidence of mesenteric or serosal disease.  Few areas of deserosalization from enterolysis oversewn.  Omentum without overt active disease but few areas of scarring, likely to represent treated disease. Negative bubble study. R0 resection.     02/20/2024 Pathology Results   SURGICAL PATHOLOGY  CASE: [phone]  PATIENT: Lori Conrad  Surgical Pathology Report   Clinical History: ovarian cancer   FINAL MICROSCOPIC DIAGNOSIS:   A. DISTAL RECTAL BIOPSY:  Peritoneum and fibroadipose tissue involved by carcinoma consistent with  gynecologic primary.       See comment.   B. SIGMOID COLON BIOPSY:       Fibromuscular tissue with lymphohistiocytic inflammation.       Negative for malignancy.   C. UTERUS, CERVIX, BILATERAL FALLOPIAN TUBES AND OVARIES, ABDOMINAL  HYSTERTCTOMY AND BILATERAL  SALPINGO-OOPHORECTOMY:   Right ovary:  Predominant necrosis with rare degenerated glandular architecture and  calcifications consistent with treatment effect.            No residual viable carcinoma identified.            See oncology table.   Cervix:           Unremarkable.           Negative for dysplasia or malignancy.       Endocervix:           Nabothian cysts.           Negative for hyperplasia, atypia or malignancy.       Endometrium:           Benign inactive endometrium.           Negative for hyperplasia, atypia or malignancy.       Myometrium:           Unremarkable.           Negative for malignancy.       Serosa:           Unremarkable.           Negative for malignancy.       Bilateral fallopian tubes:           Benign fimbriated fallopian tubes.           Negative for malignancy.       Left ovary:           Unremarkable.           Negative for malignancy.  D. OMENTUM:      Benign adipose tissue, negative for malignancy.  E. APPENDIX:      Benign vermiform appendix without significant diagnostic alteration.      Negative for malignancy.  ONCOLOGY TABLE: OVARY or FALLOPIAN TUBE or PRIMARY PERITONEUM: Resection  Procedure: Total hysterectomy and  bilateral salpingo-oophorectomy Specimen Integrity: Intact Tumor Site: Right ovary Tumor Size: 9.5 cm necrotic mass without viable carcinoma Histologic Type: Pre-treated diagnosis: high-grade serous carcinoma Histologic Grade: High-grade Ovarian Surface Involvement: NA Fallopian Tube Surface Involvement: Not identified Implants: Not applicable Lymphatic and/or Vascular Invasion:  Not identified Other Tissue/ Organ Involvement: Peritoneum of the pericolorectal soft tissue Largest Extrapelvic Peritoneal Focus: Not applicable Peritoneal/Ascitic Fluid Involvement: Identified ([id number]) Chemotherapy Response Score (CRS): CRS3 (marked response with no residual cancer) Regional Lymph Nodes: Not applicable (no  lymph nodes submitted or found)      Number of Nodes with Metastasis Greater than 10 mm: NA      Number of Nodes with Metastasis 10 mm or Less (excludes isolated tumor cells): NA      Number of Nodes with Isolated Tumor Cells (0.2 mm or less): NA      Number of Lymph Nodes Examined: 0 Distant Metastasis:      Distant Site(s) Involved: None      03/22/2024 Tumor Marker   Patient's tumor was tested for the following markers: CA-125. Results of the tumor marker test revealed 10.7.   05/03/2024 Tumor Marker   Patient's tumor was tested for the following markers: CA-125. Results of the tumor marker test revealed 9.2.   06/03/2024 Imaging   CT ABDOMEN PELVIS W CONTRAST Result Date: 06/06/2024 CLINICAL DATA:  Follow-up ovarian carcinoma. Previous surgery and chemotherapy. * Tracking Code: BO * EXAM: CT ABDOMEN AND PELVIS WITH CONTRAST TECHNIQUE: Multidetector CT imaging of the abdomen and pelvis was performed using the standard protocol following bolus administration of intravenous contrast. RADIATION DOSE REDUCTION: This exam was performed according to the departmental dose-optimization program which includes automated exposure control, adjustment of the mA and/or kV according to patient size and/or use of iterative reconstruction technique. CONTRAST:  OMNIPAQUE  IOHEXOL  300 MG/ML  SOLN COMPARISON:  01/18/2024 and 02/03/2024 FINDINGS: Lower Chest: No acute findings. Previously seen pleural based nodular density in inferior right hemithorax has resolved since prior study. Hepatobiliary: Multiple hepatic cysts show no significant change since prior exam. No suspicious hepatic masses identified. Gallbladder is unremarkable. No evidence of biliary ductal dilatation. Pancreas:  No mass or inflammatory changes. Spleen: Within normal limits in size and appearance. Adrenals/Urinary Tract: No suspicious masses identified. No evidence of ureteral calculi or hydronephrosis. Mild diffuse bladder wall thickening  and mucosal hyperenhancement is seen, consistent with cystitis. Stomach/Bowel: No evidence of obstruction, inflammatory process or abnormal fluid collections. Diverticulosis is seen mainly involving the sigmoid colon, however there is no evidence of diverticulitis. Vascular/Lymphatic: No pathologically enlarged lymph nodes. No acute vascular findings. Reproductive: Prior hysterectomy noted. Adnexal regions are unremarkable in appearance. No No evidence of peritoneal nodules or ascites. Other:  None. Musculoskeletal: No suspicious bone lesions identified. Severe lumbar spine degenerative changes again IMPRESSION: No evidence of recurrent or metastatic disease within the abdomen or pelvis. Mild diffuse bladder wall thickening and mucosal hyperenhancement, consistent with cystitis. Colonic diverticulosis, without radiographic evidence of diverticulitis. Electronically Signed   By: Norleen DELENA Kil M.D.   On: 06/06/2024 12:52      06/04/2024 Tumor Marker   Patient's tumor was tested for the following markers: CA-125. Results of the tumor marker test revealed 10.1.   06/18/2024 Procedure   Successful removal of a RIGHT chest implanted Port-A-Cath.    10/03/2024 Tumor Marker   Patient's tumor was tested for the following markers: CA-125. Results of the tumor marker test revealed 44.6.   10/04/2024 Imaging   1. Findings consistent with recurrent  disease. New abdominal/pelvic nodal and likely peritoneal implants as detailed above, including a dominant 3.1 x 3.4 cm deep left pelvic mass. 2. No bowel obstruction or other acute complication. 3. Aortic atherosclerosis (ICD10-I70.0) and emphysema (ICD10-J43.9).   10/09/2024 Procedure   Placement of a subcutaneous power-injectable port device. Catheter tip at the superior cavoatrial junction.   10/14/2024 Imaging   1. No acute process or evidence of metastatic disease in the chest. 2. Incidental findings, including: Aortic atherosclerosis (icd10-i70.0), coronary  artery atherosclerosis, and emphysema (icd10-j43.9). Aortic valvular calcifications. Consider echocardiography to evaluate for valvular dysfunction.   10/14/2024 Imaging   CT HEAD:   There is a small calcification in the left cerebellar hemisphere.   No abnormal enhancement in the brain parenchyma.   There is no hemorrhage.   No acute ischemic changes.   No mass lesion.   The ventricles are normal.   Skull/sinuses/orbits:   No significant abnormality.   IMPRESSION: No significant abnormality   10/16/2024 Echocardiogram   1. Left ventricular ejection fraction, by estimation, is 60 to 65%. The left ventricle has normal function. The left ventricle has no regional wall motion abnormalities. There is moderate concentric left ventricular hypertrophy.  2. Right ventricular systolic function is normal. The right ventricular size is normal.  3. The mitral valve is normal in structure. No evidence of mitral valve regurgitation. No evidence of mitral stenosis. Severe mitral annular calcification.  4. The aortic valve is normal in structure. There is mild calcification of the aortic valve. Aortic valve regurgitation is mild. No aortic stenosis is present.  5. The inferior vena cava is normal in size with greater than 50% respiratory variability, suggesting right atrial pressure of 3 mmHg.   10/18/2024 -  Chemotherapy   Patient is on Treatment Plan : OVARIAN Liposomal Doxorubicin  D1 + Carboplatin  D1 + Bevacizumab  D1,15 q28d / Bevacizumab  q21d     11/18/2024 Tumor Marker   Patient's tumor was tested for the following markers: CA-125. Results of the tumor marker test revealed 92.   12/16/2024 Tumor Marker   Patient's tumor was tested for the following markers: CA-125. Results of the tumor marker test revealed 104.     History of Present Illness: The patient's records from the referring physician were obtained and reviewed and the patient interviewed to confirm this HPI.  Lori Conrad  presents today to discuss cognitive changes since beginning treatment for ovarian cancer, or possibly just before, following the death of her spouse.  She describes modest impairment in attention, processing speed and short term memory.  There is increased anxiety and mood lability as well.  She is currently residing in an assisted living facility, mainly because of these memory issues.  She is otherwise able to dress, bathe herself, but no preparing meals or driving.  Otherwise denies focal complaints, no seizures, headaches.  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      Dilated aortic root    Hyperlipidemia    Hypertension    Pt denies   Hypothyroidism    Macular degeneration    Ovarian cancer [facility])    Past Surgical History:  Past Surgical History:  Procedure Laterality Date   ANKLE FRACTURE SURGERY Left    APPENDECTOMY  02/20/2024   Procedure: APPENDECTOMY;  Surgeon: Eldonna Mays, MD;  Location: [facility];  Service: Gynecology;;  BROW LIFT Bilateral 05/17/2022   Procedure: BILATERAL UPPER BLEPHAROPLASTY WITH PTOSIS REPAIR;  Surgeon: Marene Sieving, MD;  Location: [facility];  Service: Plastics;  Laterality: Bilateral;  1.5 hours   BUNIONECTOMY Left    COLONOSCOPY     EYE SURGERY     FLEXIBLE SIGMOIDOSCOPY N/A 11/02/2023   Procedure: FLEXIBLE SIGMOIDOSCOPY;  Surgeon: Saintclair Jasper, MD;  Location: [facility];  Service: Lori;  Laterality: N/A;   IR IMAGING GUIDED PORT INSERTION  11/01/2023   IR IMAGING GUIDED PORT INSERTION  10/09/2024   IR REMOVAL TUN ACCESS W/ PORT W/O FL MOD SED  06/18/2024   LAPAROSCOPY N/A 11/08/2023   Procedure: LAPAROSCOPY DIAGNOSTIC WITH BIOPSY;  Surgeon: Viktoria Comer SAUNDERS, MD;  Location: [facility];  Service: Gynecology;  Laterality: N/A;   REFRACTIVE SURGERY     laser  for macular degeneration   ROBOTIC ASSISTED TOTAL HYSTERECTOMY WITH BILATERAL SALPINGO OOPHERECTOMY Bilateral 02/20/2024   Procedure: XI ROBOTIC ASSISTED TOTAL HYSTERECTOMY WITH BILATERAL SALPINGO OOPHORECTOMY, OMENTECTOMY, DEBULKING, ENTEROLYSIS;  Surgeon: Eldonna Mays, MD;  Location: [facility];  Service: Gynecology;  Laterality: Bilateral;   SHOULDER ARTHROSCOPY     TONSILLECTOMY     Social History:  Social History   Socioeconomic History   Marital status: Widowed    Spouse name: Not on file   Number of children: 2   Years of education: Not on file   Highest education level: Not on file  Occupational History   Not on file  Tobacco Use   Smoking status: Former    Current packs/day: 0.00    Types: Cigarettes    Quit date: 05/15/1988    Years since quitting: 36.6   Smokeless tobacco: Never  Vaping Use   Vaping status: Never Used  Substance and Sexual Activity   Alcohol use: Yes    Alcohol/week: 14.0 standard drinks of alcohol    Types: 14 drink(s) per week    Comment: white wine   Drug use: No   Sexual activity: Not Currently    Partners: Male  Other Topics Concern   Not on file  Social History Narrative   Not on file   Social Drivers of Health   Tobacco Use: Medium Risk (12/14/2024)   Patient History    Smoking Tobacco Use: Former    Smokeless Tobacco Use: Never    Passive Exposure: Not on Actuary Strain: Not on file  Food Insecurity: No Food Insecurity (12/12/2024)   Epic    Worried About Programme Researcher, Broadcasting/film/video in the Last Year: Never true    Ran Out of Food in the Last Year: Never true  Transportation Needs: No Transportation Needs (12/12/2024)   Epic    Lack of Transportation (Medical): No    Lack of Transportation (Non-Medical): No  Physical Activity: Not on file  Stress: Not on file  Social Connections: Socially Isolated (02/21/2024)   Social Connection and Isolation Panel    Frequency of Communication with Friends and Family: Three times a week     Frequency of Social Gatherings with Friends and Family: Twice a week    Attends Religious Services: Never    Database Administrator or Organizations: No    Attends Banker Meetings: Never    Marital Status: Widowed  Intimate Partner Violence: Not At Risk (02/21/2024)   Humiliation, Afraid, Rape, and Kick questionnaire    Fear of Current or Ex-Partner: No    Emotionally Abused: No    Physically Abused: No  Sexually Abused: No  Depression (PHQ2-9): Low Risk (12/16/2024)   Depression (PHQ2-9)    PHQ-2 Score: 0  Alcohol Screen: Not on file  Housing: Low Risk (12/12/2024)   Epic    Unable to Pay for Housing in the Last Year: No    Number of Times Moved in the Last Year: 1    Homeless in the Last Year: No  Utilities: Not At Risk (12/12/2024)   Epic    Threatened with loss of utilities: No  Health Literacy: Not on file   Family History:  Family History  Problem Relation Age of Onset   Lung cancer Mother 39 - 23       smoked   Stroke Mother 43   Alzheimer's disease Father    CAD Father        95% occlusion of L Main & RCA, severe descending aorta, arterial & arteriolonephrosclerosis    Cholecystitis Father    Esophagitis Father    Cancer Brother 84 - 4       unknown type, agent orange exposure   Pancreatic cancer Daughter 18   BRCA 1/2 Neg Hx    Breast cancer Neg Hx    Colon cancer Neg Hx    Ovarian cancer Neg Hx    Endometrial cancer Neg Hx    Prostate cancer Neg Hx     Review of Systems: Constitutional: Doesn't report fevers, chills or abnormal weight loss Eyes: Doesn't report blurriness of vision Ears, nose, mouth, throat, and face: Doesn't report sore throat Respiratory: Doesn't report cough, dyspnea or wheezes Cardiovascular: Doesn't report palpitation, chest discomfort  Gastrointestinal:  Doesn't report nausea, constipation, diarrhea GU: Doesn't report incontinence Skin: Doesn't report skin rashes Neurological: Per HPI Musculoskeletal: Doesn't report  joint pain Behavioral/Psych: +anxiety  Physical Exam: Vitals:   12/16/24 1209  BP: 123/84  Pulse: 83  Resp: 16  Temp: 98 F (36.7 C)  SpO2: 99%   General: Alert, cooperative, pleasant, in no acute distress Head: Normal EENT: No conjunctival injection or scleral icterus.  Lungs: Resp effort normal Cardiac: Regular rate Abdomen: Non-distended abdomen Skin: No rashes cyanosis or petechiae. Extremities: No clubbing or edema  Neurologic Exam: Mental Status: Awake, alert, attentive to examiner. Oriented to self and environment. Language is fluent with intact comprehension.  Cranial Nerves: Visual acuity is grossly normal. Visual fields are full. Extra-ocular movements intact. No ptosis. Face is symmetric Motor: Tone and bulk are normal. Power is full in both arms and legs.  Sensory: Intact to light touch Gait: Normal.   Labs: I have reviewed the data as listed    Component Value Date/Time   NA 139 12/13/2024 1401   K 4.3 12/13/2024 1401   CL 102 12/13/2024 1401   CO2 27 12/13/2024 1401   GLUCOSE 108 (H) 12/13/2024 1401   BUN 16 12/13/2024 1401   CREATININE 0.83 12/13/2024 1401   CALCIUM  9.4 12/13/2024 1401   PROT 6.7 12/13/2024 1401   ALBUMIN  4.4 12/13/2024 1401   AST 31 12/13/2024 1401   ALT 19 12/13/2024 1401   ALKPHOS 82 12/13/2024 1401   BILITOT 0.5 12/13/2024 1401   GFRNONAA >60 12/13/2024 1401   Lab Results  Component Value Date   WBC 4.3 12/13/2024   NEUTROABS 2.9 12/13/2024   HGB 13.9 12/13/2024   HCT 40.7 12/13/2024   MCV 93.8 12/13/2024   PLT 157 12/13/2024    MOCA evaluation (12/16/24): 19/30, with 0/5 recall  Assessment/Plan:  Mild cognitive impairment  Shekita Boyden presents with  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  Cognitive screening evaluation does not place her into dementia territory at this time.  Etiology may due to underlying dementia syndrome such as alzheimer's, or secondary to downstream  effects of cancer and chemotherapy.  Per history, it does seem that symptoms began just prior to cancer diagnosis, and while they have been exacerbated by chemotherapy to some extent, treatments seem to have had modest negative effect overall.    There are no clear clinical features consistent with Lewy Body or vascular dementia.     CT head was reviewed, is normal with age appropriate atrophy.     We can certainly check serum markers like TSH, B12, Vit D levels.  We also discussed the utility of MRI and amyloid PET studies given concern for MCI/alzheimer's.    We provided counseling regarding healthy behaviors to maintain cognitive function, including exercise, diet, and positive outlook.    We spent twenty additional minutes teaching regarding the natural history, biology, and historical experience in the treatment of neurologic complications of cancer.   We appreciate the opportunity to participate in the care of [facility].  She is scheduled to meet with a general neurologist next week, which we encouraged. Formal follow up TBD at this time.  All questions were answered. The patient knows to call the clinic with any problems, questions or concerns. No barriers to learning were detected.  The total time spent in the encounter was 40 minutes and more than 50% was on counseling and review of test results   Arthea MARLA Manns, MD Medical Director of Neuro-Oncology [facility] 12/16/2024 1:50 PM    [1]  Current Outpatient Medications on File Prior to Visit  Medication Sig Dispense Refill   atorvastatin  (LIPITOR) 40 MG tablet Take 40 mg by mouth at bedtime.     Cholecalciferol (VITAMIN D) 50 MCG (2000 UT) tablet Take 2,000 Units by mouth daily.     doxylamine , Sleep, (UNISOM ) 25 MG tablet Take 25 mg by mouth at bedtime as needed for sleep.     Ensure (ENSURE) Take 1 Can by mouth 3 (three) times daily as needed (lack of appetite).     lactulose  (CHRONULAC )  10 GM/15ML solution Take 10.5 mLs (7 g total) by mouth 3 (three) times daily as needed for mild constipation. 236 mL 0   levothyroxine  (SYNTHROID ) 75 MCG tablet Take 75 mcg by mouth daily before breakfast.     lidocaine -prilocaine  (EMLA ) cream Apply 1 Application topically as needed.     Nutritional Supplements (NUTRITIONAL SHAKE) LIQD Take 1 Can by mouth 3 (three) times daily as needed (lack of appetite).     ondansetron  (ZOFRAN ) 8 MG tablet Take by mouth every 8 (eight) hours as needed for nausea or vomiting.     polyethylene glycol (MIRALAX  / GLYCOLAX ) 17 g packet Take 17 g by mouth 2 (two) times daily as needed for mild constipation. 14 each 0   prochlorperazine  (COMPAZINE ) 10 MG tablet Take 1 tablet (10 mg total) by mouth every 6 (six) hours as needed for nausea or vomiting. 30 tablet 1   No current facility-administered medications on file prior to visit.  [2] No Known Allergies  "

## 2024-12-17 ENCOUNTER — Telehealth: Payer: Self-pay

## 2024-12-17 ENCOUNTER — Ambulatory Visit: Admitting: Hematology and Oncology

## 2024-12-17 ENCOUNTER — Other Ambulatory Visit

## 2024-12-17 NOTE — Telephone Encounter (Signed)
 Returned her call and canceled appt for PT tomorrow at [facility] per son, RENETTE. Faxed referral to [facility] to the attention [place] for PT referral for pelvic floor dysfunction. Faxed referral to [phone], received fax confirmation.

## 2024-12-18 ENCOUNTER — Ambulatory Visit: Admitting: Physical Therapy

## 2024-12-19 ENCOUNTER — Telehealth: Payer: Self-pay

## 2024-12-19 NOTE — Telephone Encounter (Signed)
 Returned call to son. Lori Conrad fell yesterday about 1:30 pm. She was moving around in sewing room and then was waking up on the floor by herself. She hit her head with the fall and has a cut over right eye. The staff checked her out yesterday after the fall. BP good today and she feels fine today. The staff did not feel that she needed to go to the the ER. Son is having the facility encourage more fluids and check on her more to make sure she eating enough. He is going to have the staff check BP daily.  He is asking if Dr. Lonn recommends anything else.

## 2024-12-19 NOTE — Telephone Encounter (Signed)
 I am sorry to hear that I do not have any additional recommendation

## 2024-12-23 ENCOUNTER — Telehealth: Payer: Self-pay | Admitting: Diagnostic Neuroimaging

## 2024-12-23 ENCOUNTER — Ambulatory Visit: Admitting: Diagnostic Neuroimaging

## 2024-12-23 ENCOUNTER — Encounter: Payer: Self-pay | Admitting: Diagnostic Neuroimaging

## 2024-12-23 VITALS — BP 115/78 | HR 93 | Ht 61.0 in | Wt 111.2 lb

## 2024-12-23 DIAGNOSIS — R413 Other amnesia: Secondary | ICD-10-CM

## 2024-12-23 NOTE — Patient Instructions (Signed)
 MEMORY CHANGES ([facility] 15/30, mild changes in ADLs) - check MRI brain, ATN panel, B12 level - consider memantine 10mg  at bedtime; increase to twice a day after 1-2 weeks - try to stay active physically and get some exercise (at least 15-30 minutes per day) - eat a nutritious diet with lean protein, plants / vegetables, whole grains; avoid ultra-processed foods - increase social activities, brain stimulation, games, puzzles, hobbies, crafts, arts, music; try new activities; keep it fun! - aim for at least 7-8 hours sleep per night (or more) - avoid smoking and alcohol - caution with medications, finances; not driving - safety / supervision issues reviewed

## 2024-12-23 NOTE — Telephone Encounter (Signed)
MRI order sent to [facility] [phone]

## 2024-12-23 NOTE — Progress Notes (Signed)
 "  [facility]  PATIENT: Lori Conrad DOB: Dec 28, 1945  REFERRING CLINICIAN: Perley Donnice NOVAK, NP HISTORY FROM: patient and son REASON FOR VISIT: new consult   HISTORICAL  CHIEF COMPLAINT:  Chief Complaint  Patient presents with   RM 7     Patient is here for worsening cognition - patient has been having issues with her memory for over a year and wose in the last 6 months. More short- term issues saw a neurologist last week and did a test shown her issues are more short-term vs. Long term. MoCA - 15    HISTORY OF PRESENT ILLNESS:   79 year old female here for evaluation of mild memory changes.  History of stage III ovarian cancer diagnosed November 2024 followed by surgery and chemotherapy in 2025.    Memory symptoms started in early 2025 with increasing difficulty with keeping up with schedules, conversations, medications, appointments, word finding difficulties.  Was also having some more difficulty learning new things especially related to technology.  Patient moved to [facility] assisted living around January 2025.  Able to maintain most of her ADLs except needs help with medications, transportation, finances and communication issues such as email or phone.   REVIEW OF SYSTEMS: Full 14 system review of systems performed and negative with exception of: as per HPI.  ALLERGIES: Allergies[1]  HOME MEDICATIONS: Outpatient Medications Prior to Visit  Medication Sig Dispense Refill   atorvastatin  (LIPITOR) 40 MG tablet Take 40 mg by mouth at bedtime.     Cholecalciferol (VITAMIN D) 50 MCG (2000 UT) tablet Take 2,000 Units by mouth daily.     doxylamine , Sleep, (UNISOM ) 25 MG tablet Take 25 mg by mouth at bedtime as needed for sleep.     Ensure (ENSURE) Take 1 Can by mouth 3 (three) times daily as needed (lack of appetite).     lactulose  (CHRONULAC ) 10 GM/15ML solution Take 10.5 mLs (7 g total) by mouth 3 (three) times daily as needed for mild constipation.  236 mL 0   levothyroxine  (SYNTHROID ) 75 MCG tablet Take 75 mcg by mouth daily before breakfast.     lidocaine -prilocaine  (EMLA ) cream Apply 1 Application topically as needed.     Nutritional Supplements (NUTRITIONAL SHAKE) LIQD Take 1 Can by mouth 3 (three) times daily as needed (lack of appetite).     ondansetron  (ZOFRAN ) 8 MG tablet Take by mouth every 8 (eight) hours as needed for nausea or vomiting.     polyethylene glycol (MIRALAX  / GLYCOLAX ) 17 g packet Take 17 g by mouth 2 (two) times daily as needed for mild constipation. 14 each 0   prochlorperazine  (COMPAZINE ) 10 MG tablet Take 1 tablet (10 mg total) by mouth every 6 (six) hours as needed for nausea or vomiting. 30 tablet 1   No facility-administered medications prior to visit.    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      Dilated aortic root    Hyperlipidemia    Hypertension    Pt denies   Hypothyroidism    Macular degeneration    Ovarian cancer (HCC)     PAST SURGICAL HISTORY: Past Surgical History:  Procedure Laterality Date   ANKLE FRACTURE SURGERY Left    APPENDECTOMY  02/20/2024   Procedure: APPENDECTOMY;  Surgeon: Eldonna Mays, MD;  Location: [facility];  Service: Gynecology;;   BROW LIFT Bilateral 05/17/2022   Procedure: BILATERAL UPPER BLEPHAROPLASTY  WITH PTOSIS REPAIR;  Surgeon: Marene Sieving, MD;  Location: [facility] OR;  Service: Plastics;  Laterality: Bilateral;  1.5 hours   BUNIONECTOMY Left    COLONOSCOPY     EYE SURGERY     FLEXIBLE SIGMOIDOSCOPY N/A 11/02/2023   Procedure: FLEXIBLE SIGMOIDOSCOPY;  Surgeon: Saintclair Jasper, MD;  Location: [facility] END[facility]COPY;  Service: Gastroenterology;  Laterality: N/A;   IR IMAGING GUIDED PORT INSERTION  11/01/2023   IR IMAGING GUIDED PORT INSERTION  10/09/2024   IR REMOVAL TUN ACCESS W/ PORT W/O FL MOD SED  06/18/2024   LAPAROSCOPY N/A 11/08/2023   Procedure: LAPAROSCOPY DIAGNOSTIC  WITH BIOPSY;  Surgeon: Viktoria Comer SAUNDERS, MD;  Location: [facility];  Service: Gynecology;  Laterality: N/A;   REFRACTIVE SURGERY     laser for macular degeneration   ROBOTIC ASSISTED TOTAL HYSTERECTOMY WITH BILATERAL SALPINGO OOPHERECTOMY Bilateral 02/20/2024   Procedure: XI ROBOTIC ASSISTED TOTAL HYSTERECTOMY WITH BILATERAL SALPINGO OOPHORECTOMY, OMENTECTOMY, DEBULKING, ENTEROLYSIS;  Surgeon: Eldonna Mays, MD;  Location: [facility];  Service: Gynecology;  Laterality: Bilateral;   SHOULDER ARTHROSCOPY     TONSILLECTOMY      FAMILY HISTORY: Family History  Problem Relation Age of Onset   Lung cancer Mother 7 - 35       smoked   Stroke Mother 6   Alzheimer's disease Father    CAD Father        95% occlusion of L Main & RCA, severe descending aorta, arterial & arteriolonephrosclerosis    Cholecystitis Father    Esophagitis Father    Cancer Brother 62 - 62       unknown type, agent orange exposure   Pancreatic cancer Daughter 62   Sleep apnea Other    BRCA 1/2 Neg Hx    Breast cancer Neg Hx    Colon cancer Neg Hx    Ovarian cancer Neg Hx    Endometrial cancer Neg Hx    Prostate cancer Neg Hx    Seizures Neg Hx    Migraines Neg Hx     SOCIAL HISTORY: Social History   Socioeconomic History   Marital status: Widowed    Spouse name: Not on file   Number of children: 2   Years of education: Not on file   Highest education level: Not on file  Occupational History   Not on file  Tobacco Use   Smoking status: Former    Current packs/day: 0.00    Types: Cigarettes    Quit date: 05/15/1988    Years since quitting: 36.6   Smokeless tobacco: Never  Vaping Use   Vaping status: Never Used  Substance and Sexual Activity   Alcohol use: Yes    Alcohol/week: 14.0 standard drinks of alcohol    Types: 14 drink(s) per week    Comment: white wine   Drug use: No   Sexual activity: Not Currently    Partners: Male  Other Topics Concern   Not on file  Social History Narrative   1 mug  of coffee daily    Social Drivers of Health   Tobacco Use: Medium Risk (12/23/2024)   Patient History    Smoking Tobacco Use: Former    Smokeless Tobacco Use: Never    Passive Exposure: Not on Actuary Strain: Not on file  Food Insecurity: No Food Insecurity (12/12/2024)   Epic    Worried About Programme Researcher, Broadcasting/film/video in the Last Year: Never true    [employer] of Food in the Last Year:  Never true  Transportation Needs: No Transportation Needs (12/12/2024)   Epic    Lack of Transportation (Medical): No    Lack of Transportation (Non-Medical): No  Physical Activity: Not on file  Stress: Not on file  Social Connections: Socially Isolated (02/21/2024)   Social Connection and Isolation Panel    Frequency of Communication with Friends and Family: Three times a week    Frequency of Social Gatherings with Friends and Family: Twice a week    Attends Religious Services: Never    Database Administrator or Organizations: No    Attends Banker Meetings: Never    Marital Status: Widowed  Intimate Partner Violence: Not At Risk (02/21/2024)   Humiliation, Afraid, Rape, and Kick questionnaire    Fear of Current or Ex-Partner: No    Emotionally Abused: No    Physically Abused: No    Sexually Abused: No  Depression (PHQ2-9): Low Risk (12/16/2024)   Depression (PHQ2-9)    PHQ-2 Score: 0  Alcohol Screen: Not on file  Housing: Low Risk (12/12/2024)   Epic    Unable to Pay for Housing in the Last Year: No    Number of Times Moved in the Last Year: 1    Homeless in the Last Year: No  Utilities: Not At Risk (12/12/2024)   Epic    Threatened with loss of utilities: No  Health Literacy: Not on file     PHYSICAL EXAM  GENERAL EXAM/CONSTITUTIONAL: Vitals:  Vitals:   12/23/24 1139  BP: 115/78  Pulse: 93  Weight: 111 lb 3.2 oz (50.4 kg)  Height: 5' 1 (1.549 m)   Body mass index is 21.01 kg/m. Wt Readings from Last 3 Encounters:  12/23/24 111 lb 3.2 oz (50.4 kg)  12/16/24  111 lb (50.3 kg)  11/29/24 110 lb (49.9 kg)   Patient is in no distress; well developed, nourished and groomed; neck is supple  CARDIOVASCULAR: Examination of carotid arteries is normal; no carotid bruits Regular rate and rhythm, no murmurs Examination of peripheral vascular system by observation and palpation is normal  EYES: Ophthalmoscopic exam of optic discs and posterior segments is normal; no papilledema or hemorrhages No results found.  MUSCULOSKELETAL: Gait, strength, tone, movements noted in Neurologic exam below  NEUROLOGIC: MENTAL STATUS:      No data to display            12/23/2024   11:42 AM  Montreal Cognitive Assessment   Visuospatial/ Executive (0/5) 1  Naming (0/3) 3  Attention: Read list of digits (0/2) 2  Attention: Read list of letters (0/1) 1  Attention: Serial 7 subtraction starting at 100 (0/3) 0  Language: Repeat phrase (0/2) 1  Language : Fluency (0/1) 1  Abstraction (0/2) 2  Delayed Recall (0/5) 0  Orientation (0/6) 4  Total 15  Adjusted Score (based on education) 15    awake, alert, oriented to person, place and time [facility] memory  DECR attention and concentration language fluent, comprehension intact, naming intact fund of knowledge appropriate  CRANIAL NERVE:  2nd - no papilledema on fundoscopic exam 2nd, 3rd, 4th, 6th - pupils equal and reactive to light, visual fields full to confrontation, extraocular muscles intact, no nystagmus 5th - facial sensation symmetric 7th - facial strength symmetric 8th - hearing intact 9th - palate elevates symmetrically, uvula midline 11th - shoulder shrug symmetric 12th - tongue protrusion midline  MOTOR:  normal bulk and tone, full strength in the BUE, BLE  SENSORY:  normal and  symmetric to light touch, temperature, vibration  COORDINATION:  finger-nose-finger, fine finger movements normal  REFLEXES:  deep tendon reflexes 1+ and symmetric  GAIT/STATION:  narrow based  gait     DIAGNOSTIC DATA (LABS, IMAGING, TESTING) - I reviewed patient records, labs, notes, testing and imaging myself where available.  Lab Results  Component Value Date   WBC 4.3 12/13/2024   HGB 13.9 12/13/2024   HCT 40.7 12/13/2024   MCV 93.8 12/13/2024   PLT 157 12/13/2024      Component Value Date/Time   NA 139 12/13/2024 1401   K 4.3 12/13/2024 1401   CL 102 12/13/2024 1401   CO2 27 12/13/2024 1401   GLUCOSE 108 (H) 12/13/2024 1401   BUN 16 12/13/2024 1401   CREATININE 0.83 12/13/2024 1401   CALCIUM  9.4 12/13/2024 1401   PROT 6.7 12/13/2024 1401   ALBUMIN  4.4 12/13/2024 1401   AST 31 12/13/2024 1401   ALT 19 12/13/2024 1401   ALKPHOS 82 12/13/2024 1401   BILITOT 0.5 12/13/2024 1401   GFRNONAA >60 12/13/2024 1401   Lab Results  Component Value Date   CHOL 207 (H) 05/15/2014   HDL 87 05/15/2014   LDLCALC 106 (H) 05/15/2014   TRIG 68 05/15/2014   No results found for: HGBA1C No results found for: VITAMINB12 No results found for: TSH   10/14/24 CT head - No significant abnormality    ASSESSMENT AND PLAN  79 y.o. year old female here with:   Dx:  1. 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 - aim for at least 7-8 hours sleep per night (or more) - avoid smoking and alcohol - caution with medications, finances; not driving - safety / supervision issues reviewed  Orders Placed This Encounter  Procedures   MR BRAIN W WO CONTRAST   ATN  PROFILE   Vitamin B12   TSH Rfx on Abnormal to Free T4   Return for return to PCP, pending if symptoms worsen or fail to improve, pending test results.    EDUARD FABIENE HANLON, MD 12/23/2024, 12:19 PM Certified in Neurology, Neurophysiology and Neuroimaging  [facility] [address], [address] [place], [place] [address] [phone]     [1] No Known Allergies  "

## 2024-12-24 LAB — TSH RFX ON ABNORMAL TO FREE T4: TSH: 2.61 u[IU]/mL (ref 0.450–4.500)

## 2024-12-26 LAB — ATN PROFILE
A -- Beta-amyloid 42/40 Ratio: 0.097 — AB
Beta-amyloid 40: 216.24 pg/mL
Beta-amyloid 42: 20.98 pg/mL
N -- NfL, Plasma: 3.58 pg/mL (ref 0.00–6.04)
T -- p-tau181: 1.29 pg/mL — AB (ref 0.00–0.97)

## 2024-12-26 LAB — VITAMIN B12: Vitamin B-12: 711 pg/mL (ref 232–1245)

## 2024-12-27 ENCOUNTER — Inpatient Hospital Stay

## 2024-12-27 ENCOUNTER — Encounter: Payer: Self-pay | Admitting: Hematology and Oncology

## 2024-12-27 ENCOUNTER — Inpatient Hospital Stay: Admitting: Hematology and Oncology

## 2024-12-27 VITALS — BP 132/81 | HR 88 | Temp 98.6°F | Resp 18 | Ht 61.0 in | Wt 109.4 lb

## 2024-12-27 DIAGNOSIS — R42 Dizziness and giddiness: Secondary | ICD-10-CM

## 2024-12-27 DIAGNOSIS — C561 Malignant neoplasm of right ovary: Secondary | ICD-10-CM

## 2024-12-27 DIAGNOSIS — Z5111 Encounter for antineoplastic chemotherapy: Secondary | ICD-10-CM | POA: Diagnosis not present

## 2024-12-27 MED ORDER — SODIUM CHLORIDE 0.9 % IV SOLN
10.0000 mg/kg | Freq: Once | INTRAVENOUS | Status: AC
  Administered 2024-12-27: 500 mg via INTRAVENOUS
  Filled 2024-12-27: qty 4

## 2024-12-27 MED ORDER — SODIUM CHLORIDE 0.9 % IV SOLN: INTRAVENOUS | Status: DC

## 2024-12-27 MED ORDER — SODIUM CHLORIDE 0.9% FLUSH: 10.0000 mL | INTRAVENOUS | Status: DC | PRN

## 2024-12-27 NOTE — Assessment & Plan Note (Addendum)
 She had recent falls in her skilled facility I suspect this could be due to postural hypotension Her vital signs today are stable We will proceed with treatment without delay We discussed importance of hydration

## 2024-12-27 NOTE — Progress Notes (Signed)
 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  She completed adjuvant chemotherapy with carboplatin  and paclitaxel  in May 2025 Unfortunately, recent tumor marker was elevated I reviewed repeat CT imaging of the abdomen and pelvis with the patient and family which unfortunately shows signs of recurrent disease  She tolerated last 2 cycles of treatment without difficulties except for progressive decline in cognition I have reviewed evaluation by neuro oncologist and neurologist We will proceed with treatment She will have CT imaging next week to assess response to therapy Dizziness She had recent falls in her skilled facility I suspect this could be due to postural hypotension Her vital signs today are stable We will proceed with treatment without delay We discussed importance of hydration  No orders of the defined types were placed in this encounter.    Lori Bedford, MD  INTERVAL HISTORY: she returns for treatment follow-up Complications related to previous cycle of chemotherapy included memory changes and recent fall in the skilled facility I have reviewed documentation by neuro oncologist and neurologist CT imaging is scheduled next week MRI is pending She denies other side effects from treatment so far Documented blood pressure at home was normal  PHYSICAL EXAMINATION: ECOG PERFORMANCE STATUS: 1 - Symptomatic but completely ambulatory  Lab Results  Component Value Date   CAN125 104.0 (H) 12/13/2024   CAN125 92.0 (H) 11/15/2024    CAN125 44.6 (H) 10/02/2024      Latest Ref Rng & Units 12/13/2024    2:01 PM 11/15/2024    7:55 AM 10/18/2024    7:59 AM  CBC  WBC 4.0 - 10.5 K/uL 4.3  4.5  6.3   Hemoglobin 12.0 - 15.0 g/dL 86.0  84.7  85.0   Hematocrit 36.0 - 46.0 % 40.7  44.7  45.0   Platelets 150 - 400 K/uL 157  140  173       Chemistry      Component Value Date/Time   NA 139 12/13/2024 1401   K 4.3 12/13/2024 1401   CL 102 12/13/2024 1401   CO2 27 12/13/2024 1401   BUN 16 12/13/2024 1401   CREATININE 0.83 12/13/2024 1401      Component Value Date/Time   CALCIUM  9.4 12/13/2024 1401   ALKPHOS 82 12/13/2024 1401   AST 31 12/13/2024 1401   ALT 19 12/13/2024 1401   BILITOT 0.5 12/13/2024 1401       Vitals:   12/27/24 0954  BP: 132/81  Pulse: 88  Resp: 18  Temp: 98.6 F (37 C)  SpO2: 100%   Filed Weights   12/27/24 0954  Weight: 109 lb 6.4 oz (49.6 kg)   Other relevant data reviewed during this visit included CBC, CMP, CA125

## 2024-12-27 NOTE — Assessment & Plan Note (Addendum)
 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

## 2025-01-03 ENCOUNTER — Ambulatory Visit (HOSPITAL_BASED_OUTPATIENT_CLINIC_OR_DEPARTMENT_OTHER)
Admission: RE | Admit: 2025-01-03 | Discharge: 2025-01-03 | Disposition: A | Source: Ambulatory Visit | Attending: Hematology and Oncology

## 2025-01-03 DIAGNOSIS — C561 Malignant neoplasm of right ovary: Secondary | ICD-10-CM | POA: Insufficient documentation

## 2025-01-03 MED ORDER — IOHEXOL 300 MG/ML  SOLN
80.0000 mL | Freq: Once | INTRAMUSCULAR | Status: AC | PRN
  Administered 2025-01-03: 80 mL via INTRAVENOUS

## 2025-01-07 ENCOUNTER — Ambulatory Visit: Payer: Self-pay | Admitting: Diagnostic Neuroimaging

## 2025-01-08 ENCOUNTER — Encounter: Payer: Self-pay | Admitting: Diagnostic Neuroimaging

## 2025-01-10 ENCOUNTER — Inpatient Hospital Stay

## 2025-01-10 ENCOUNTER — Other Ambulatory Visit: Payer: Self-pay

## 2025-01-10 ENCOUNTER — Inpatient Hospital Stay: Admitting: Hematology and Oncology

## 2025-01-10 VITALS — HR 92 | Temp 98.2°F | Resp 16 | Ht 61.0 in | Wt 111.0 lb

## 2025-01-10 VITALS — BP 126/85

## 2025-01-10 DIAGNOSIS — K5909 Other constipation: Secondary | ICD-10-CM | POA: Diagnosis not present

## 2025-01-10 DIAGNOSIS — E785 Hyperlipidemia, unspecified: Secondary | ICD-10-CM

## 2025-01-10 DIAGNOSIS — R32 Unspecified urinary incontinence: Secondary | ICD-10-CM | POA: Diagnosis not present

## 2025-01-10 DIAGNOSIS — I10 Essential (primary) hypertension: Secondary | ICD-10-CM

## 2025-01-10 DIAGNOSIS — C561 Malignant neoplasm of right ovary: Secondary | ICD-10-CM

## 2025-01-10 DIAGNOSIS — Z5111 Encounter for antineoplastic chemotherapy: Secondary | ICD-10-CM | POA: Diagnosis not present

## 2025-01-10 DIAGNOSIS — D696 Thrombocytopenia, unspecified: Secondary | ICD-10-CM

## 2025-01-10 DIAGNOSIS — R159 Full incontinence of feces: Secondary | ICD-10-CM | POA: Diagnosis not present

## 2025-01-10 LAB — CMP (CANCER CENTER ONLY)
ALT: 19 U/L (ref 0–44)
AST: 30 U/L (ref 15–41)
Albumin: 4.3 g/dL (ref 3.5–5.0)
Alkaline Phosphatase: 87 U/L (ref 38–126)
Anion gap: 11 (ref 5–15)
BUN: 13 mg/dL (ref 8–23)
CO2: 26 mmol/L (ref 22–32)
Calcium: 9.7 mg/dL (ref 8.9–10.3)
Chloride: 102 mmol/L (ref 98–111)
Creatinine: 0.62 mg/dL (ref 0.44–1.00)
GFR, Estimated: >60 mL/min
Glucose, Bld: 132 mg/dL — ABNORMAL HIGH (ref 70–99)
Potassium: 4.4 mmol/L (ref 3.5–5.1)
Sodium: 139 mmol/L (ref 135–145)
Total Bilirubin: 0.5 mg/dL (ref 0.0–1.2)
Total Protein: 6.9 g/dL (ref 6.5–8.1)

## 2025-01-10 LAB — CBC WITH DIFFERENTIAL (CANCER CENTER ONLY)
Abs Immature Granulocytes: 0.02 10*3/uL (ref 0.00–0.07)
Basophils Absolute: 0 10*3/uL (ref 0.0–0.1)
Basophils Relative: 1 %
Eosinophils Absolute: 0 10*3/uL (ref 0.0–0.5)
Eosinophils Relative: 1 %
HCT: 42.1 % (ref 36.0–46.0)
Hemoglobin: 14.3 g/dL (ref 12.0–15.0)
Immature Granulocytes: 0 %
Lymphocytes Relative: 15 %
Lymphs Abs: 0.8 10*3/uL (ref 0.7–4.0)
MCH: 32.4 pg (ref 26.0–34.0)
MCHC: 34 g/dL (ref 30.0–36.0)
MCV: 95.2 fL (ref 80.0–100.0)
Monocytes Absolute: 0.7 10*3/uL (ref 0.1–1.0)
Monocytes Relative: 12 %
Neutro Abs: 3.8 10*3/uL (ref 1.7–7.7)
Neutrophils Relative %: 71 %
Platelet Count: 137 10*3/uL — ABNORMAL LOW (ref 150–400)
RBC: 4.42 MIL/uL (ref 3.87–5.11)
RDW: 15.9 % — ABNORMAL HIGH (ref 11.5–15.5)
WBC Count: 5.3 10*3/uL (ref 4.0–10.5)
nRBC: 0 % (ref 0.0–0.2)

## 2025-01-10 MED ORDER — DEXTROSE 5 % IV SOLN: INTRAVENOUS | Status: DC

## 2025-01-10 MED ORDER — CETIRIZINE HCL 10 MG/ML IV SOLN
10.0000 mg | Freq: Once | INTRAVENOUS | Status: AC
  Administered 2025-01-10: 10 mg via INTRAVENOUS
  Filled 2025-01-10: qty 1

## 2025-01-10 MED ORDER — FAMOTIDINE IN NACL 20-0.9 MG/50ML-% IV SOLN
20.0000 mg | Freq: Once | INTRAVENOUS | Status: AC
  Administered 2025-01-10: 20 mg via INTRAVENOUS
  Filled 2025-01-10: qty 50

## 2025-01-10 MED ORDER — POLYETHYLENE GLYCOL 3350 17 G PO PACK: 17.0000 g | PACK | Freq: Every day | ORAL | Status: DC

## 2025-01-10 MED ORDER — POLYETHYLENE GLYCOL 3350 17 G PO PACK: 17.0000 g | PACK | Freq: Every day | ORAL | 5 refills | Status: AC

## 2025-01-10 MED ORDER — SODIUM CHLORIDE 0.9 % IV SOLN
10.0000 mg/kg | Freq: Once | INTRAVENOUS | Status: AC
  Administered 2025-01-10: 500 mg via INTRAVENOUS
  Filled 2025-01-10: qty 4

## 2025-01-10 MED ORDER — DEXAMETHASONE SOD PHOSPHATE PF 10 MG/ML IJ SOLN
10.0000 mg | Freq: Once | INTRAMUSCULAR | Status: AC
  Administered 2025-01-10: 10 mg via INTRAVENOUS
  Filled 2025-01-10: qty 1

## 2025-01-10 MED ORDER — SODIUM CHLORIDE 0.9 % IV SOLN
306.0000 mg | Freq: Once | INTRAVENOUS | Status: AC
  Administered 2025-01-10: 310 mg via INTRAVENOUS
  Filled 2025-01-10: qty 31

## 2025-01-10 MED ORDER — APREPITANT 130 MG/18ML IV EMUL
130.0000 mg | Freq: Once | INTRAVENOUS | Status: AC
  Administered 2025-01-10: 130 mg via INTRAVENOUS
  Filled 2025-01-10: qty 18

## 2025-01-10 MED ORDER — SODIUM CHLORIDE 0.9 % IV SOLN: INTRAVENOUS | Status: DC

## 2025-01-10 MED ORDER — DOXORUBICIN HCL LIPOSOMAL CHEMO INJECTION 2 MG/ML
30.0000 mg/m2 | Freq: Once | INTRAVENOUS | Status: AC
  Administered 2025-01-10: 44 mg via INTRAVENOUS
  Filled 2025-01-10: qty 22

## 2025-01-10 MED ORDER — PALONOSETRON HCL INJECTION 0.25 MG/5ML
0.2500 mg | Freq: Once | INTRAVENOUS | Status: AC
  Administered 2025-01-10: 0.25 mg via INTRAVENOUS
  Filled 2025-01-10: qty 5

## 2025-01-10 NOTE — Progress Notes (Signed)
 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  She completed adjuvant chemotherapy with carboplatin  and paclitaxel  in May 2025 Unfortunately, recent tumor marker was elevated I reviewed repeat CT imaging of the abdomen and pelvis with the patient and family which unfortunately shows signs of recurrent disease  The patient was started on second line treatment with liposomal doxorubicin , carboplatin  and bevacizumab  She tolerated last 3 cycles of treatment without difficulties except for progressive decline in cognition Recent workup 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 The patient denies recent history of bleeding such as epistaxis, hematuria or hematochezia. She is asymptomatic from the low platelet count. I will observe for now.  she does not require transfusion now. I will continue the chemotherapy at current dose without dosage adjustment.  If the thrombocytopenia gets progressive worse in the future, I might have to delay her treatment or adjust the chemotherapy dose.  Other constipation Recent imaging studies show evidence of moderate stool burden We discussed importance of daily laxatives Urinary  and fecal incontinence She is undergoing PT OT evaluation and therapy I signed some additional paperwork today for speech and language therapist as well   Orders Placed This Encounter  Procedures   ECHOCARDIOGRAM COMPLETE    Standing Status:   Future    Expected Date:   01/17/2025    Expiration Date:   01/10/2026    Perflutren DEFINITY (image enhancing agent) should be administered unless hypersensitivity or allergy exist:   Administer Perflutren    Where should this test be performed:   [facility]    Does the patient weigh less than or greater than 250 lbs?:   Patient weighs less than 250 lbs    Reason for exam-Echo:   Chemo  Z09     Lori Bedford, MD  INTERVAL HISTORY: she returns for treatment follow-up Complications related to previous cycle of chemotherapy included thrombocytopenia,, constipation,, memory changes, and  debility I reviewed blood work and imaging study with the patient and her son  PHYSICAL EXAMINATION: ECOG PERFORMANCE STATUS: 1 - Symptomatic but completely ambulatory  Lab Results  Component Value Date   CAN125 104.0 (H) 12/13/2024   CAN125 92.0 (H) 11/15/2024   CAN125 44.6 (H) 10/02/2024      Latest Ref Rng & Units 01/10/2025   11:59 AM 12/13/2024    2:01 PM 11/15/2024    7:55 AM  CBC  WBC 4.0 - 10.5 K/uL 5.3  4.3  4.5   Hemoglobin 12.0 - 15.0 g/dL 85.6  86.0  84.7   Hematocrit 36.0 - 46.0 % 42.1  40.7  44.7   Platelets 150 - 400 K/uL 137  157  140       Chemistry      Component Value Date/Time   NA 139 01/10/2025 1159  K 4.4 01/10/2025 1159   CL 102 01/10/2025 1159   CO2 26 01/10/2025 1159   BUN 13 01/10/2025 1159   CREATININE 0.62 01/10/2025 1159      Component Value Date/Time   CALCIUM  9.7 01/10/2025 1159   ALKPHOS 87 01/10/2025 1159   AST 30 01/10/2025 1159   ALT 19 01/10/2025 1159   BILITOT 0.5 01/10/2025 1159       Vitals:   01/10/25 1222  Pulse: 92  Resp: 16  Temp: 98.2 F (36.8 C)  SpO2: 99%   Filed Weights    01/10/25 1222  Weight: 111 lb (50.3 kg)   Other relevant data reviewed during this visit included CBC, CMP, CA125, CT imaging

## 2025-01-10 NOTE — Assessment & Plan Note (Addendum)
 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  She completed adjuvant chemotherapy with carboplatin  and paclitaxel  in May 2025 Unfortunately, recent tumor marker was elevated I reviewed repeat CT imaging of the abdomen and pelvis with the patient and family which unfortunately shows signs of recurrent disease  The patient was started on second line treatment with liposomal doxorubicin , carboplatin  and bevacizumab  She tolerated last 3 cycles of treatment without difficulties except for progressive decline in cognition Recent workup 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

## 2025-01-10 NOTE — Assessment & Plan Note (Addendum)
 She is undergoing PT OT evaluation and therapy I signed some additional paperwork today for speech and language therapist as well

## 2025-01-10 NOTE — Assessment & Plan Note (Addendum)
 Recent imaging studies show evidence of moderate stool burden We discussed importance of daily laxatives

## 2025-01-10 NOTE — Assessment & Plan Note (Addendum)
This is likely due to recent treatment. The patient denies recent history of bleeding such as epistaxis, hematuria or hematochezia. She is asymptomatic from the low platelet count. I will observe for now.  she does not require transfusion now. I will continue the chemotherapy at current dose without dosage adjustment.  If the thrombocytopenia gets progressive worse in the future, I might have to delay her treatment or adjust the chemotherapy dose.   

## 2025-01-10 NOTE — Patient Instructions (Signed)
 [facility] - A DEPT OF [place]  Discharge Instructions: Thank you for choosing [facility] to provide your oncology and hematology care.   If you have a lab appointment with the Cancer Center, please go directly to the Cancer Center and check in at the registration area.   Wear comfortable clothing and clothing appropriate for easy access to any Portacath or PICC line.   We strive to give you quality time with your provider. You may need to reschedule your appointment if you arrive late (15 or more minutes).  Arriving late affects you and other patients whose appointments are after yours.  Also, if you miss three or more appointments without notifying the office, you may be dismissed from the clinic at the providers discretion.      For prescription refill requests, have your pharmacy contact our office and allow 72 hours for refills to be completed.    Today you received the following chemotherapy and/or immunotherapy agents: CARBOplatin  (PARAPLATIN ), bevacizumab -awwb and DOXOrubicin  HCL LIPOSOMAL   To help prevent nausea and vomiting after your treatment, we encourage you to take your nausea medication as directed.  BELOW ARE SYMPTOMS THAT SHOULD BE REPORTED IMMEDIATELY: *FEVER GREATER THAN 100.4 F (38 C) OR HIGHER *CHILLS OR SWEATING *NAUSEA AND VOMITING THAT IS NOT CONTROLLED WITH YOUR NAUSEA MEDICATION *UNUSUAL SHORTNESS OF BREATH *UNUSUAL BRUISING OR BLEEDING *URINARY PROBLEMS (pain or burning when urinating, or frequent urination) *BOWEL PROBLEMS (unusual diarrhea, constipation, pain near the anus) TENDERNESS IN MOUTH AND THROAT WITH OR WITHOUT PRESENCE OF ULCERS (sore throat, sores in mouth, or a toothache) UNUSUAL RASH, SWELLING OR PAIN  UNUSUAL VAGINAL DISCHARGE OR ITCHING   Items with * indicate a potential emergency and should be followed up as soon as possible or go to the Emergency Department if any problems should  occur.  Please show the CHEMOTHERAPY ALERT CARD or IMMUNOTHERAPY ALERT CARD at check-in to the Emergency Department and triage nurse.  Should you have questions after your visit or need to cancel or reschedule your appointment, please contact [facility] - A DEPT OF [facility]  Dept: [phone]  and follow the prompts.  Office hours are 8:00 a.m. to 4:30 p.m. Monday - Friday. Please note that voicemails left after 4:00 p.m. may not be returned until the following business day.  We are closed weekends and major holidays. You have access to a nurse at all times for urgent questions. Please call the main number to the clinic Dept: [phone] and follow the prompts.   For any non-urgent questions, you may also contact your provider using MyChart. We now offer e-Visits for anyone 74 and older to request care online for non-urgent symptoms. For details visit [email or web address].   Also download the MyChart app! Go to the app store, search MyChart, open the app, select [place], and log in with your MyChart username and password.  Doxorubicin  Injection What is this medication? DOXORUBICIN  (dox oh ROO bi sin) treats some types of cancer. It works by slowing down the growth of cancer cells. This medicine may be used for other purposes; ask your health care provider or pharmacist if you have questions. COMMON BRAND NAME(S): Adriamycin , Adriamycin  PFS, Adriamycin  RDF, Rubex  What should I tell my care team before I take this medication? They need to know if you have any of these conditions: Heart disease History of low blood cell levels caused by a  medication Liver disease Recent or ongoing radiation An unusual or allergic reaction to doxorubicin , other medications, foods, dyes, or preservatives If you or your partner are pregnant or trying to get pregnant Breast-feeding How should I use this medication? This medication is injected into a vein. It is given  by your care team in a hospital or clinic setting. Talk to your care team about the use of this medication in children. Special care may be needed. Overdosage: If you think you have taken too much of this medicine contact a poison control center or emergency room at once. NOTE: This medicine is only for you. Do not share this medicine with others. What if I miss a dose? Keep appointments for follow-up doses. It is important not to miss your dose. Call your care team if you are unable to keep an appointment. What may interact with this medication? 6-mercaptopurine Paclitaxel  Phenytoin St. John's wort Trastuzumab Verapamil This list may not describe all possible interactions. Give your health care provider a list of all the medicines, herbs, non-prescription drugs, or dietary supplements you use. Also tell them if you smoke, drink alcohol, or use illegal drugs. Some items may interact with your medicine. What should I watch for while using this medication? Your condition will be monitored carefully while you are receiving this medication. You may need blood work while taking this medication. This medication may make you feel generally unwell. This is not uncommon as chemotherapy can affect healthy cells as well as cancer cells. Report any side effects. Continue your course of treatment even though you feel ill unless your care team tells you to stop. There is a maximum amount of this medication you should receive throughout your life. The amount depends on the medical condition being treated and your overall health. Your care team will watch how much of this medication you receive. Tell your care team if you have taken this medication before. Your urine may turn red for a few days after your dose. This is not blood. If your urine is dark or brown, call your care team. In some cases, you may be given additional medications to help with side effects. Follow all directions for their use. This  medication may increase your risk of getting an infection. Call your care team for advice if you get a fever, chills, sore throat, or other symptoms of a cold or flu. Do not treat yourself. Try to avoid being around people who are sick. This medication may increase your risk to bruise or bleed. Call your care team if you notice any unusual bleeding. Talk to your care team about your risk of cancer. You may be more at risk for certain types of cancers if you take this medication. Talk to your care team if you or your partner may be pregnant. Serious birth defects can occur if you take this medication during pregnancy and for 6 months after the last dose. Contraception is recommended while taking this medication and for 6 months after the last dose. Your care team can help you find the option that works for you. If your partner can get pregnant, use a condom while taking this medication and for 6 months after the last dose. Do not breastfeed while taking this medication. This medication may cause infertility. Talk to your care team if you are concerned about your fertility. What side effects may I notice from receiving this medication? Side effects that you should report to your care team as soon as possible: Allergic  reactions--skin rash, itching, hives, swelling of the face, lips, tongue, or throat Heart failure--shortness of breath, swelling of the ankles, feet, or hands, sudden weight gain, unusual weakness or fatigue Heart rhythm changes--fast or irregular heartbeat, dizziness, feeling faint or lightheaded, chest pain, trouble breathing Infection--fever, chills, cough, sore throat, wounds that don't heal, pain or trouble when passing urine, general feeling of discomfort or being unwell Low red blood cell level--unusual weakness or fatigue, dizziness, headache, trouble breathing Painful swelling, warmth, or redness of the skin, blisters or sores at the infusion site Unusual bruising or  bleeding Side effects that usually do not require medical attention (report to your care team if they continue or are bothersome): Diarrhea Hair loss Nausea Pain, redness, or swelling with sores inside the mouth or throat Red urine This list may not describe all possible side effects. Call your doctor for medical advice about side effects. You may report side effects to FDA at 1-800-FDA-1088. Where should I keep my medication? This medication is given in a hospital or clinic. It will not be stored at home. NOTE: This sheet is a summary. It may not cover all possible information. If you have questions about this medicine, talk to your doctor, pharmacist, or health care provider.  2024 Elsevier/Gold Standard (2023-03-02 00:00:00)Carboplatin  Injection What is this medication? CARBOPLATIN  (KAR boe pla tin) treats some types of cancer. It works by slowing down the growth of cancer cells. This medicine may be used for other purposes; ask your health care provider or pharmacist if you have questions. COMMON BRAND NAME(S): Paraplatin  What should I tell my care team before I take this medication? They need to know if you have any of these conditions: Blood disorders Hearing problems Kidney disease Recent or ongoing radiation therapy An unusual or allergic reaction to carboplatin , cisplatin, other medications, foods, dyes, or preservatives Pregnant or trying to get pregnant Breast-feeding How should I use this medication? This medication is injected into a vein. It is given by your care team in a hospital or clinic setting. Talk to your care team about the use of this medication in children. Special care may be needed. Overdosage: If you think you have taken too much of this medicine contact a poison control center or emergency room at once. NOTE: This medicine is only for you. Do not share this medicine with others. What if I miss a dose? Keep appointments for follow-up doses. It is important  not to miss your dose. Call your care team if you are unable to keep an appointment. What may interact with this medication? Medications for seizures Some antibiotics, such as amikacin, gentamicin, neomycin, streptomycin, tobramycin  Vaccines This list may not describe all possible interactions. Give your health care provider a list of all the medicines, herbs, non-prescription drugs, or dietary supplements you use. Also tell them if you smoke, drink alcohol, or use illegal drugs. Some items may interact with your medicine. What should I watch for while using this medication? Your condition will be monitored carefully while you are receiving this medication. You may need blood work while taking this medication. This medication may make you feel generally unwell. This is not uncommon, as chemotherapy can affect healthy cells as well as cancer cells. Report any side effects. Continue your course of treatment even though you feel ill unless your care team tells you to stop. In some cases, you may be given additional medications to help with side effects. Follow all directions for their use. This medication may increase your  risk of getting an infection. Call your care team for advice if you get a fever, chills, sore throat, or other symptoms of a cold or flu. Do not treat yourself. Try to avoid being around people who are sick. Avoid taking medications that contain aspirin, acetaminophen , ibuprofen, naproxen, or ketoprofen unless instructed by your care team. These medications may hide a fever. Be careful brushing or flossing your teeth or using a toothpick because you may get an infection or bleed more easily. If you have any dental work done, tell your dentist you are receiving this medication. Talk to your care team if you wish to become pregnant or think you might be pregnant. This medication can cause serious birth defects. Talk to your care team about effective forms of contraception. Do not  breast-feed while taking this medication. What side effects may I notice from receiving this medication? Side effects that you should report to your care team as soon as possible: Allergic reactions--skin rash, itching, hives, swelling of the face, lips, tongue, or throat Infection--fever, chills, cough, sore throat, wounds that don't heal, pain or trouble when passing urine, general feeling of discomfort or being unwell Low red blood cell level--unusual weakness or fatigue, dizziness, headache, trouble breathing Pain, tingling, or numbness in the hands or feet, muscle weakness, change in vision, confusion or trouble speaking, loss of balance or coordination, trouble walking, seizures Unusual bruising or bleeding Side effects that usually do not require medical attention (report to your care team if they continue or are bothersome): Hair loss Nausea Unusual weakness or fatigue Vomiting This list may not describe all possible side effects. Call your doctor for medical advice about side effects. You may report side effects to FDA at 1-800-FDA-1088. Where should I keep my medication? This medication is given in a hospital or clinic. It will not be stored at home. NOTE: This sheet is a summary. It may not cover all possible information. If you have questions about this medicine, talk to your doctor, pharmacist, or health care provider.  2024 Elsevier/Gold Standard (2022-03-22 00:00:00)

## 2025-01-17 ENCOUNTER — Ambulatory Visit (HOSPITAL_COMMUNITY): Admission: RE | Admit: 2025-01-17

## 2025-01-17 DIAGNOSIS — Z79899 Other long term (current) drug therapy: Secondary | ICD-10-CM

## 2025-01-17 DIAGNOSIS — E785 Hyperlipidemia, unspecified: Secondary | ICD-10-CM

## 2025-01-17 DIAGNOSIS — C561 Malignant neoplasm of right ovary: Secondary | ICD-10-CM

## 2025-01-17 DIAGNOSIS — I10 Essential (primary) hypertension: Secondary | ICD-10-CM

## 2025-01-17 LAB — ECHOCARDIOGRAM COMPLETE
AR max vel: 2.55 cm2
AV Area VTI: 2.35 cm2
AV Area mean vel: 2.77 cm2
AV Mean grad: 5 mmHg
AV Peak grad: 8.9 mmHg
Ao pk vel: 1.49 m/s
Area-P 1/2: 6.43 cm2
MV VTI: 2.91 cm2
P 1/2 time: 480 ms
S' Lateral: 2 cm

## 2025-01-22 ENCOUNTER — Other Ambulatory Visit

## 2025-01-24 ENCOUNTER — Inpatient Hospital Stay

## 2025-01-24 ENCOUNTER — Inpatient Hospital Stay: Attending: Psychiatry | Admitting: Hematology and Oncology

## 2025-02-07 ENCOUNTER — Inpatient Hospital Stay: Admitting: Hematology and Oncology

## 2025-02-07 ENCOUNTER — Inpatient Hospital Stay

## 2025-03-24 ENCOUNTER — Inpatient Hospital Stay: Admitting: Psychiatry

## 2025-03-24 ENCOUNTER — Inpatient Hospital Stay
